# Patient Record
Sex: Female | Born: 1935 | Race: White | Hispanic: No | Smoking: Never smoker
Health system: Southern US, Community
[De-identification: ages and names within clinical notes are randomized; demographics above are authoritative.]

## PROBLEM LIST (undated history)

## (undated) DIAGNOSIS — I10 Essential (primary) hypertension: Secondary | ICD-10-CM

## (undated) DIAGNOSIS — S31109A Unspecified open wound of abdominal wall, unspecified quadrant without penetration into peritoneal cavity, initial encounter: Secondary | ICD-10-CM

## (undated) DIAGNOSIS — E538 Deficiency of other specified B group vitamins: Secondary | ICD-10-CM

## (undated) DIAGNOSIS — Z8619 Personal history of other infectious and parasitic diseases: Secondary | ICD-10-CM

## (undated) DIAGNOSIS — K56609 Unspecified intestinal obstruction, unspecified as to partial versus complete obstruction: Secondary | ICD-10-CM

## (undated) DIAGNOSIS — M1990 Unspecified osteoarthritis, unspecified site: Secondary | ICD-10-CM

## (undated) DIAGNOSIS — M8580 Other specified disorders of bone density and structure, unspecified site: Secondary | ICD-10-CM

## (undated) DIAGNOSIS — C801 Malignant (primary) neoplasm, unspecified: Secondary | ICD-10-CM

## (undated) DIAGNOSIS — D509 Iron deficiency anemia, unspecified: Secondary | ICD-10-CM

## (undated) DIAGNOSIS — Z86718 Personal history of other venous thrombosis and embolism: Secondary | ICD-10-CM

## (undated) DIAGNOSIS — R0789 Other chest pain: Secondary | ICD-10-CM

## (undated) DIAGNOSIS — I6523 Occlusion and stenosis of bilateral carotid arteries: Secondary | ICD-10-CM

## (undated) DIAGNOSIS — I351 Nonrheumatic aortic (valve) insufficiency: Secondary | ICD-10-CM

## (undated) DIAGNOSIS — IMO0001 Reserved for inherently not codable concepts without codable children: Secondary | ICD-10-CM

## (undated) HISTORY — DX: Other chest pain: R07.89

## (undated) HISTORY — DX: Malignant (primary) neoplasm, unspecified: C80.1

## (undated) HISTORY — PX: ILEOSTOMY: SHX1783

## (undated) HISTORY — DX: Occlusion and stenosis of bilateral carotid arteries: I65.23

## (undated) HISTORY — PX: COLOSTOMY: SHX63

## (undated) HISTORY — DX: Personal history of other infectious and parasitic diseases: Z86.19

## (undated) HISTORY — DX: Nonrheumatic aortic (valve) insufficiency: I35.1

## (undated) HISTORY — DX: Iron deficiency anemia, unspecified: D50.9

## (undated) HISTORY — DX: Unspecified intestinal obstruction, unspecified as to partial versus complete obstruction: K56.609

## (undated) HISTORY — PX: COLON SURGERY: SHX602

## (undated) HISTORY — DX: Essential (primary) hypertension: I10

## (undated) HISTORY — PX: COLECTOMY: SHX59

## (undated) HISTORY — DX: Other specified disorders of bone density and structure, unspecified site: M85.80

## (undated) HISTORY — PX: OTHER SURGICAL HISTORY: SHX169

## (undated) HISTORY — DX: Personal history of other venous thrombosis and embolism: Z86.718

## (undated) HISTORY — DX: Reserved for inherently not codable concepts without codable children: IMO0001

## (undated) HISTORY — DX: Deficiency of other specified B group vitamins: E53.8

---

## 1982-03-24 HISTORY — PX: ABDOMINAL HYSTERECTOMY: SHX81

## 1982-03-24 HISTORY — PX: APPENDECTOMY: SHX54

## 1998-09-12 ENCOUNTER — Other Ambulatory Visit: Admission: RE | Admit: 1998-09-12 | Discharge: 1998-09-12 | Payer: Self-pay | Admitting: Obstetrics and Gynecology

## 1999-11-11 ENCOUNTER — Other Ambulatory Visit: Admission: RE | Admit: 1999-11-11 | Discharge: 1999-11-11 | Payer: Self-pay | Admitting: Obstetrics and Gynecology

## 2000-02-17 ENCOUNTER — Encounter (INDEPENDENT_AMBULATORY_CARE_PROVIDER_SITE_OTHER): Payer: Self-pay | Admitting: *Deleted

## 2000-02-17 ENCOUNTER — Ambulatory Visit (HOSPITAL_COMMUNITY): Admission: RE | Admit: 2000-02-17 | Discharge: 2000-02-17 | Payer: Self-pay | Admitting: *Deleted

## 2000-02-18 ENCOUNTER — Encounter: Payer: Self-pay | Admitting: General Surgery

## 2000-02-18 ENCOUNTER — Inpatient Hospital Stay (HOSPITAL_COMMUNITY): Admission: RE | Admit: 2000-02-18 | Discharge: 2000-02-25 | Payer: Self-pay | Admitting: General Surgery

## 2000-02-18 ENCOUNTER — Encounter (INDEPENDENT_AMBULATORY_CARE_PROVIDER_SITE_OTHER): Payer: Self-pay | Admitting: *Deleted

## 2000-03-24 DIAGNOSIS — Z86718 Personal history of other venous thrombosis and embolism: Secondary | ICD-10-CM

## 2000-03-24 HISTORY — DX: Personal history of other venous thrombosis and embolism: Z86.718

## 2000-04-17 ENCOUNTER — Encounter: Payer: Self-pay | Admitting: Oncology

## 2000-04-17 ENCOUNTER — Inpatient Hospital Stay (HOSPITAL_COMMUNITY): Admission: EM | Admit: 2000-04-17 | Discharge: 2000-04-23 | Payer: Self-pay | Admitting: Oncology

## 2000-04-17 ENCOUNTER — Encounter: Admission: RE | Admit: 2000-04-17 | Discharge: 2000-04-17 | Payer: Self-pay | Admitting: Oncology

## 2000-06-01 ENCOUNTER — Encounter: Payer: Self-pay | Admitting: Oncology

## 2000-06-01 ENCOUNTER — Ambulatory Visit (HOSPITAL_COMMUNITY): Admission: RE | Admit: 2000-06-01 | Discharge: 2000-06-01 | Payer: Self-pay | Admitting: Oncology

## 2000-06-02 ENCOUNTER — Inpatient Hospital Stay (HOSPITAL_COMMUNITY): Admission: EM | Admit: 2000-06-02 | Discharge: 2000-06-13 | Payer: Self-pay | Admitting: Oncology

## 2000-06-03 ENCOUNTER — Encounter: Payer: Self-pay | Admitting: Oncology

## 2000-06-08 ENCOUNTER — Encounter: Payer: Self-pay | Admitting: Oncology

## 2000-06-09 ENCOUNTER — Encounter: Payer: Self-pay | Admitting: Oncology

## 2000-11-02 ENCOUNTER — Other Ambulatory Visit: Admission: RE | Admit: 2000-11-02 | Discharge: 2000-11-02 | Payer: Self-pay | Admitting: Obstetrics and Gynecology

## 2001-03-04 ENCOUNTER — Ambulatory Visit (HOSPITAL_COMMUNITY): Admission: RE | Admit: 2001-03-04 | Discharge: 2001-03-04 | Payer: Self-pay | Admitting: *Deleted

## 2001-03-04 ENCOUNTER — Encounter (INDEPENDENT_AMBULATORY_CARE_PROVIDER_SITE_OTHER): Payer: Self-pay | Admitting: *Deleted

## 2001-05-20 ENCOUNTER — Emergency Department (HOSPITAL_COMMUNITY): Admission: EM | Admit: 2001-05-20 | Discharge: 2001-05-20 | Payer: Self-pay | Admitting: *Deleted

## 2002-02-21 ENCOUNTER — Ambulatory Visit (HOSPITAL_COMMUNITY): Admission: RE | Admit: 2002-02-21 | Discharge: 2002-02-21 | Payer: Self-pay | Admitting: *Deleted

## 2006-11-09 ENCOUNTER — Encounter: Admission: RE | Admit: 2006-11-09 | Discharge: 2006-11-09 | Payer: Self-pay | Admitting: Gastroenterology

## 2006-12-24 ENCOUNTER — Ambulatory Visit: Admission: RE | Admit: 2006-12-24 | Discharge: 2006-12-24 | Payer: Self-pay | Admitting: General Surgery

## 2006-12-24 ENCOUNTER — Encounter (INDEPENDENT_AMBULATORY_CARE_PROVIDER_SITE_OTHER): Payer: Self-pay | Admitting: General Surgery

## 2006-12-24 ENCOUNTER — Inpatient Hospital Stay (HOSPITAL_COMMUNITY): Admission: RE | Admit: 2006-12-24 | Discharge: 2007-01-07 | Payer: Self-pay | Admitting: General Surgery

## 2007-02-22 ENCOUNTER — Ambulatory Visit (HOSPITAL_COMMUNITY): Admission: RE | Admit: 2007-02-22 | Discharge: 2007-02-22 | Payer: Self-pay | Admitting: General Surgery

## 2007-04-08 ENCOUNTER — Inpatient Hospital Stay (HOSPITAL_COMMUNITY): Admission: RE | Admit: 2007-04-08 | Discharge: 2007-04-11 | Payer: Self-pay | Admitting: General Surgery

## 2007-09-14 ENCOUNTER — Encounter: Admission: RE | Admit: 2007-09-14 | Discharge: 2007-09-14 | Payer: Self-pay | Admitting: Orthopedic Surgery

## 2007-11-03 ENCOUNTER — Encounter: Admission: RE | Admit: 2007-11-03 | Discharge: 2007-11-03 | Payer: Self-pay | Admitting: General Surgery

## 2008-03-24 HISTORY — PX: REPLACEMENT TOTAL KNEE: SUR1224

## 2008-03-24 HISTORY — PX: JOINT REPLACEMENT: SHX530

## 2008-11-17 HISTORY — PX: OTHER SURGICAL HISTORY: SHX169

## 2009-02-01 ENCOUNTER — Inpatient Hospital Stay (HOSPITAL_COMMUNITY): Admission: RE | Admit: 2009-02-01 | Discharge: 2009-02-05 | Payer: Self-pay | Admitting: Orthopedic Surgery

## 2009-02-05 ENCOUNTER — Ambulatory Visit: Payer: Self-pay | Admitting: Vascular Surgery

## 2009-02-05 ENCOUNTER — Encounter (INDEPENDENT_AMBULATORY_CARE_PROVIDER_SITE_OTHER): Payer: Self-pay | Admitting: Orthopedic Surgery

## 2009-11-29 ENCOUNTER — Ambulatory Visit: Payer: Self-pay | Admitting: Cardiology

## 2009-12-24 ENCOUNTER — Encounter: Admission: RE | Admit: 2009-12-24 | Discharge: 2009-12-24 | Payer: Self-pay | Admitting: General Surgery

## 2010-03-24 DIAGNOSIS — IMO0001 Reserved for inherently not codable concepts without codable children: Secondary | ICD-10-CM

## 2010-03-24 HISTORY — DX: Reserved for inherently not codable concepts without codable children: IMO0001

## 2010-04-16 ENCOUNTER — Encounter
Admission: RE | Admit: 2010-04-16 | Discharge: 2010-04-16 | Payer: Self-pay | Source: Home / Self Care | Attending: Cardiology | Admitting: Cardiology

## 2010-04-18 ENCOUNTER — Ambulatory Visit: Payer: Self-pay | Admitting: Cardiology

## 2010-06-26 LAB — BASIC METABOLIC PANEL
BUN: 12 mg/dL (ref 6–23)
BUN: 16 mg/dL (ref 6–23)
CO2: 27 mEq/L (ref 19–32)
CO2: 28 mEq/L (ref 19–32)
Calcium: 8.1 mg/dL — ABNORMAL LOW (ref 8.4–10.5)
Creatinine, Ser: 0.71 mg/dL (ref 0.4–1.2)
GFR calc Af Amer: >60 mL/min (ref >60)
Glucose, Bld: 123 mg/dL — ABNORMAL HIGH (ref 70–99)
Potassium: 3.7 mEq/L (ref 3.5–5.1)
Potassium: 4 mEq/L (ref 3.5–5.1)
Sodium: 139 mEq/L (ref 135–145)

## 2010-06-26 LAB — DIFFERENTIAL
Basophils Absolute: 0 10*3/uL (ref 0.0–0.1)
Basophils Relative: 1 % (ref 0–1)
Eosinophils Absolute: 0.1 10*3/uL (ref 0.0–0.7)
Eosinophils Relative: 2 % (ref 0–5)
Lymphs Abs: 1.3 10*3/uL (ref 0.7–4.0)
Monocytes Absolute: 0.4 10*3/uL (ref 0.1–1.0)
Monocytes Relative: 10 % (ref 3–12)
Neutro Abs: 2.7 10*3/uL (ref 1.7–7.7)

## 2010-06-26 LAB — COMPREHENSIVE METABOLIC PANEL
BUN: 14 mg/dL (ref 6–23)
Chloride: 104 mEq/L (ref 96–112)
Creatinine, Ser: 0.77 mg/dL (ref 0.4–1.2)
GFR calc Af Amer: >60 mL/min (ref >60)
Glucose, Bld: 104 mg/dL — ABNORMAL HIGH (ref 70–99)
Potassium: 4.2 mEq/L (ref 3.5–5.1)
Sodium: 140 mEq/L (ref 135–145)

## 2010-06-26 LAB — CROSSMATCH
ABO/RH(D): B POS
Antibody Screen: NEGATIVE

## 2010-06-26 LAB — CBC
HCT: 26.4 % — ABNORMAL LOW (ref 36.0–46.0)
HCT: 31.3 % — ABNORMAL LOW (ref 36.0–46.0)
HCT: 34.1 % — ABNORMAL LOW (ref 36.0–46.0)
MCHC: 34.2 g/dL (ref 30.0–36.0)
MCHC: 34.8 g/dL (ref 30.0–36.0)
MCV: 92.3 fL (ref 78.0–100.0)
MCV: 92.4 fL (ref 78.0–100.0)
Platelets: 175 10*3/uL (ref 150–400)
Platelets: 189 10*3/uL (ref 150–400)
Platelets: 224 10*3/uL (ref 150–400)
Platelets: 274 10*3/uL (ref 150–400)
RDW: 12.8 % (ref 11.5–15.5)
RDW: 13 % (ref 11.5–15.5)
WBC: 4.5 10*3/uL (ref 4.0–10.5)
WBC: 4.5 10*3/uL (ref 4.0–10.5)

## 2010-06-26 LAB — URINALYSIS, ROUTINE W REFLEX MICROSCOPIC
Bilirubin Urine: NEGATIVE
Glucose, UA: NEGATIVE mg/dL
Hgb urine dipstick: NEGATIVE
Ketones, ur: NEGATIVE mg/dL
Specific Gravity, Urine: 1.01 (ref 1.005–1.030)

## 2010-06-26 LAB — HEMOGLOBIN AND HEMATOCRIT, BLOOD: Hemoglobin: 8.5 g/dL — ABNORMAL LOW (ref 12.0–15.0)

## 2010-06-26 LAB — GLUCOSE, CAPILLARY: Glucose-Capillary: 117 mg/dL — ABNORMAL HIGH (ref 70–99)

## 2010-06-26 LAB — APTT: aPTT: 28 seconds (ref 24–37)

## 2010-06-26 LAB — PREPARE RBC (CROSSMATCH)

## 2010-07-09 ENCOUNTER — Other Ambulatory Visit: Payer: Self-pay | Admitting: *Deleted

## 2010-07-09 DIAGNOSIS — R002 Palpitations: Secondary | ICD-10-CM

## 2010-07-09 DIAGNOSIS — I10 Essential (primary) hypertension: Secondary | ICD-10-CM

## 2010-07-09 MED ORDER — HYDROCHLOROTHIAZIDE 12.5 MG PO CAPS: 12.5000 mg | ORAL_CAPSULE | Freq: Every day | ORAL | Status: DC

## 2010-07-09 MED ORDER — ATENOLOL 50 MG PO TABS: 50.0000 mg | ORAL_TABLET | Freq: Two times a day (BID) | ORAL | Status: DC

## 2010-07-09 MED ORDER — LOSARTAN POTASSIUM 100 MG PO TABS: 100.0000 mg | ORAL_TABLET | Freq: Every day | ORAL | Status: DC

## 2010-07-09 NOTE — Telephone Encounter (Signed)
Refilled meds per fax request.  

## 2010-07-10 ENCOUNTER — Other Ambulatory Visit: Payer: Self-pay | Admitting: *Deleted

## 2010-07-15 ENCOUNTER — Encounter: Payer: Self-pay | Admitting: Cardiology

## 2010-08-06 NOTE — Op Note (Signed)
Priscilla [name], Priscilla [name]              ACCOUNT NO.:  [id number]   MEDICAL RECORD NO.:  [id number]          PATIENT TYPE:  INP   LOCATION:  0005                         FACILITY:  [facility]   PHYSICIAN:  Adolph Pollack, M.D.DATE OF BIRTH:  October 16, 1930   DATE OF PROCEDURE:  04/08/2007  DATE OF DISCHARGE:                               OPERATIVE REPORT   PREOPERATIVE DIAGNOSES:  Loop ileostomy.   POSTOPERATIVE DIAGNOSIS:  Loop ileostomy.   PROCEDURE:  Loop ileostomy closure.   SURGEON:  Adolph Pollack, M.D.   ASSISTANT:  Anselm Pancoast. Zachery Dakins, M.D.   ANESTHESIA:  General.   INDICATIONS:  A 76-year female underwent low anterior resection with a  protective loop ileostomy.  She now presents for closure of loop  ileostomy.   TECHNIQUE:  She was brought to the operating room, placed supine on the  operating table and general anesthetic was administered.  A Foley  catheter was placed in the bladder.  The stomal appliance was removed.  The abdominal wall and stoma were sterilely prepped and draped.  A  elliptical incision was made around the stoma through skin and  subcutaneous tissue.  Using careful sharp dissection, as well select  electrocautery I dissected subcutaneous tissue away from both limbs of  the ileum down to the level of the fascia.  I then divided adhesions  between the fascia with the ileum allowing me to bring some of the ileum  up into the wound along with the stomas.  I cleaned subcutaneous tissue  off the two limbs of the loops of the ileum until I could bring them  together in a side-to-side fashion.   I then brought a GIA 75 stapler into the field.  I placed each limb of  the stapler down the afferent and efferent loops of the loop ileostomy  and fired the stapler creating a side-to-side anastomosis.  The common  defect was then closed using a linear non-cutting stapler.  A crotch  stitch of 3-0 silk was used.  Anastomosis was patent and viable under no  tension.   I dropped this back into the abdominal cavity.   At this time, gloves were changed.  There are some adhesions between the  small bowel and the posterior abdominal wall near the fascia which I  carefully mobilized sharply dropping the small bowel away from the  abdominal wall allowing for easier closure.  The needle, sponge,  instrument counts reportedly correct at this time.  Subsequently, then  noted that hemostasis was adequate.  I closed the fascia with  interrupted #1 PDS sutures in a figure-of-eight  type fashion.  I then copiously irrigated out the subcutaneous tissue.  I then closed the skin loosely with staples.  Applied a sterile  dressing.   She tolerated the procedure without apparent complications and was taken  to recovery in satisfactory condition.      Adolph Pollack, M.D.  Electronically Signed     TJR/MEDQ  D:  04/08/2007  T:  04/08/2007  Job:  [id number]   cc:   Graylin Shiver, M.D.  Fax: [phone]  Cassell Clement, M.D.  Fax: [phone]

## 2010-08-06 NOTE — Op Note (Signed)
Priscilla [name], Priscilla [name]              ACCOUNT NO.:  [id number]   MEDICAL RECORD NO.:  [id number]          PATIENT TYPE:  INP   LOCATION:  1528                         FACILITY:  [facility]   PHYSICIAN:  Adolph Pollack, M.D.DATE OF BIRTH:  1930/10/01   DATE OF PROCEDURE:  12/24/2006  DATE OF DISCHARGE:                               OPERATIVE REPORT   PREOPERATIVE DIAGNOSIS:  Cancer of the rectosigmoid colon.   POSTOPERATIVE DIAGNOSIS:  Rectal cancer.   PROCEDURE:  Low anterior resection of the rectum and loop ileostomy.   SURGEON:  Adolph Pollack, M.D.   ASSISTANT:  Leonie Man, M.D.   ANESTHESIA:  General.   INDICATIONS:  Priscilla [name] is a 77 year old female who has a history  right colon cancer and underwent a right total knee colectomy in the  past.  She is noted to have some anemia, had repeat colonoscopy, had a  sessile polyp at the rectosigmoid junction by report that was biopsy  positive for adenocarcinoma.  She now presents for the above procedure.   TECHNIQUE:  She is seen in the holding area and brought to the operating  room, placed on the operating table and general anesthetic was  administered.  A Foley catheter placed in her bladder, and she was  placed in the lithotomy position.  The abdominal wall and perineal area  were sterilely prepped and draped.  A midline incision was made from the  pubis to the umbilicus, traversing just to the left the umbilicus -  dividing the skin, subcutaneous tissue, fascia and peritoneum.  Once  entering the peritoneal cavity, there was some omental adhesions to the  right abdominal wall that were taken down with electrocautery.  The  pelvis was fairly free of most adhesions.  A self-retaining retractor  was placed.  She was placed in Trendelenburg position.   The sigmoid colon and distal descending colon were mobilized by dividing  the lateral attachments.  The left and right ureters were identified and  traced and unharmed.  I  further mobilized the rectum below the  peritoneal reflection by dividing some of its lateral attachments.  I  then chose an area in the distal descending colon/proximal sigmoid colon  region and divided the colon with a GIA stapler.  I divided the  mesentery using the LigaSure, and the larger mesenteric vessels were  divided and ligated with suture.  I then used a LigaSure to divide  posterior attachments below the area where we felt the lesion was.  I  performed a proctosigmoidoscopy prior to incision and felt that the  lesion was approximately 14 cm.  I got to the level where I thought that  was adequate.  The lateral rectal attachments were divided with a  LigaSure.  Using a linear cutting stapler, I then divided the rectum at  its midpoint.  I then took the specimen to the back table, opened it up  and did not see anything that looked like a sessile-type lesion.  I felt  that I may need to take a little bit more of the rectum and thus  mobilized about  another 2 to 3 cm of it and resected it with the linear  cutting stapler.  When I opened this up, I also did not see a sessile  lesion as was described by Dr. Evette Cristal in his colonoscopy note.  I  subsequently talked to Dr. Evette Cristal intraoperatively, and he described the  lesion as more of a flat, firm area.  Upon very careful inspection of  the first specimen near the distal margin, there was a flat, firm lesion  that matched its description.  I marked this with as a suture.  Given  the second resection, I did get an adequate distal margin.  These were  sent to pathology.   Following this, I mobilized the descending colon some more to allow it  to drop down in the pelvis.  I made an incision and obtained taenia and  inserted a size 29 EEA anvil.  I then used a 2-0 Prolene to perform a  pursestring suture around it.  I then performed a stapled anastomosis  mass using the circular stapler in a side-to-end fashion.  Upon  completion of the  anastomosis, two intact donuts were noted, the distal  most which was sent for the distal-most margin.   Following this, I placed the anastomosis in water and occluded it,  injected air through the rectum.  There appeared to be a leak, and I  noticed a small hole despite the intact donuts, in the lateral left  lateral position which I closed with some silk suture.  There was a  little tension on the anastomosis, and I further mobilized the  descending colon by dividing the lateral attachments, taking the tension  off.   Since there was a small leak and I repaired it, I felt it would be the  safest thing to do, to do a temporary loop ileostomy for diversion and  also insert a drain.  Stab wound was made in the left lower quadrant,  and a 19-Blake drain was inserted to the pelvis.  Irrigation was  performed, and hemostasis was adequate.  Irrigation fluid was evacuated.  I then noted a number of adhesions in the right lower quadrant region.  I lysed the adhesions between the omentum and the anterior abdominal  wall, omentum and small bowel in the right gutter area, and small bowel  to small bowel until I identified the ileocolonic anastomosis.  This  took approximately one hour.  I then traced this proximally and chose  some ileum.  A circular incision was made in the right lower quadrant  through the skin and subcutaneous tissue and a cruciate incision made  through the anterior and posterior fascia and dilated to 2  fingerbreadths.  A loop of ileum was then brought up through this  incision and a small defect made in the mesentery adjacent to the bowel.  A loop stroma bar was then placed through a small defect and anchored to  the skin with 4-0 nylon suture.   Following this, a needle, sponge, and instrument count was requested and  reported to be correct.  The drain was sewn to the skin with 3-0 Ethilon  suture in the left lower quadrant.  The fascia was closed with a running  #1 PDS  suture.  The subcutaneous tissues were irrigated, and skin was  closed with staples.   A loop ileostomy was matured by making a transverse incision in it and  then maturing it with 3-0 Vicryl sutures.  A stomal appliance was  applied.  A sterile dressing was applied in the midline wound.   She tolerated the procedure without any apparent complications and was  taken to recovery in satisfactory condition.      Adolph Pollack, M.D.  Electronically Signed     TJR/MEDQ  D:  12/25/2006  T:  12/25/2006  Job:  [id number]   cc:   Graylin Shiver, M.D.  Fax: [phone]

## 2010-08-06 NOTE — H&P (Signed)
Priscilla [name], Priscilla [name]              ACCOUNT NO.:  [id number]   MEDICAL RECORD NO.:  [id number]          PATIENT TYPE:  INP   LOCATION:  0005                         [facility]ACILITY:  [facility]   PHYSICIAN:  Adolph Pollack, M.D.DATE OF BIRTH:  [birth year]   DATE OF ADMISSION:  04/08/2007  DATE OF DISCHARGE:                              HISTORY & PHYSICAL   REASON FOR ADMISSION:  Loop ileostomy closure.   HISTORY OF PRESENT ILLNESS:  Priscilla [name] is a 77 year old female who had  rectal cancer.  She had a low anterior resection and productive loop  ileostomy.  She has recovered completely from that as well as postop  therapies and now presents for loop ileostomy closure.  We discussed the  procedure and risks preoperatively.   PAST MEDICAL HISTORY:  1. Rectal cancer.  2. Cecal cancer.  3. Mitral valve prolapse.  4. Hypertension.  5. Deep venous thrombosis of the lower extremity.   HER PREVIOUS OPERATIONS:  1. Right colectomy.  2. Hysterectomy.  3. Bladder suspension.  4. A low anterior resection with loop ileostomy.   ALLERGIES:  None known.   MEDICATIONS:  Atenolol, multivitamins, calcium.   SOCIAL HISTORY:  No tobacco or alcohol use.   FAMILY HISTORY:  Notable for heart disease.   PHYSICAL EXAM:  Generally a well-developed, well-nourished female.  She  is in no acute distress and very pleasant and cooperative.  Her vital  signs:  Temperature is 97.7, blood pressure is 141/57, pulse 71, O2  saturation 98%.   EYES:  Extraocular movements are intact.  No icterus.  NECK: Supple without obvious masses.  RESPIRATORY:  Breath sounds equal.  Respirations unlabored.  CARDIOVASCULAR:  Regular rate, regular rhythm.  No murmur.  ABDOMEN: Soft with midline scar well healed without hernia.  Right lower  quadrant stomas are noted.  EXTREMITIES:  SCDs hose on.   IMPRESSION:  Loop ileostomy - she has had a preop study demonstrating  patency of the low rectal anastomosis with no leak.   PLAN:  Loop ostomy closure.      Adolph Pollack, M.D.  Electronically Signed     TJR/MEDQ  D:  04/08/2007  T:  04/08/2007  Job:  [id number]

## 2010-08-09 NOTE — Op Note (Signed)
[facility]  Patient:    Priscilla [name], Priscilla [name]                     MRN: [id number] Proc. Date: 02/18/00 Adm. Date:  [id number] Attending:  Arlis Porta CC:         Celso Sickle, M.D.  Roosvelt Harps, M.D.  Thomas A. Patty Sermons, M.D.   Operative Report  PREOPERATIVE DIAGNOSIS:  Adenocarcinoma of the cecum.  POSTOPERATIVE DIAGNOSIS:  Adenocarcinoma of the cecum.  OPERATION PERFORMED:  Right colectomy with resection of distal ileum.  SURGEON:  Adolph Pollack, M.D.  ASSISTANT:  Rose Phi. Maple Hudson, M.D.  ANESTHESIA:  General.  INDICATIONS FOR PROCEDURE:  The patient is a 77 year old female who had occult blood in her stool.  She underwent a colonoscopy which demonstrated a firm cecal mass that was biopsied, positive for adenocarcinoma.  She now presents for elective resection.  FINDINGS:  There was a lesion which appeared to be growing into the wall of the cecum.  There was one nodule present on the distal and the serosal surface of the distal ileum.  This part of the distal ileum was resected with the right colon.  DESCRIPTION OF PROCEDURE:  She was placed supine on the operating table and a general anesthetic was administered.  Her abdomen was sterilely prepped and draped.  A transverse incision was made in the right midabdomen incising the skin sharply and then incising the subcutaneous tissues, fascia, muscle and peritoneum.  Once entering the abdominal cavity was explored.  The liver was smooth and without any lesions present.  The gallbladder was without gallstones.  Exploring the small intestine demonstrated a lesion, there was a small nodule measuring 2 to 3 mm.  It was 10 cm proximal to the ileocecal valve.  I could palpate the cecal tumor.  I began by mobilizint the cecum incising its lateral attachments.  Doing this, there was a serosal injury to the cautery to the duodenum and this was repaired with a single Vicryl  suture Lembert type suture with omental patch placed over it.  There was no leakage of bowel contents as this was the only serosal injury.  Next, I dissected the omentum free from the transverse colon.  I then divided the 2 or 3 cm proximal to a nodular lesion in the distal ileum.  I then divided the colon at the junction of the proximal one third, distal two thirds of the transverse colon.  The mesentery and the vessels were then divided between clamps and ligated.  The specimen was handed off the field and sent to pathology.  Next, a side-to-side staples anastomosis was performed.  The anastomosis was patent, viable and without tension.  The mesenteric defect was closed with interrupted Vicryl sutures.  All gloves were then changed and the abdominal cavity was irrigated.  No active bleeding was noted.  The posterior fascial layer was then closed with running #1 PDS suture.  The anterior fascial layer was closed with running #1 PDS suture.  The subcutaneous tissue was irrigated and the skin closed with staples.  The patient tolerated the procedure well without any apparent complications and was taken to recovery room in satisfactory condition. DD:  02/18/00 TD:  02/18/00 Job: [id number]

## 2010-08-09 NOTE — Discharge Summary (Signed)
[facility]  Patient:    Priscilla [name], Priscilla [name]                     MRN: [id number] Adm. Date:  [id number] Disc. Date: [id number] Attending:  Jama Flavors Pearcy CC:         Adolph Pollack, M.D.  John C. Madilyn Fireman, M.D.   Discharge Summary  HISTORY OF PRESENT ILLNESS:  The patient is a 77 year old, white female with history of colon cancer, status post right hemicolectomy in December 2001, with 13 nodes negative who was admitted with diarrhea, nausea and vomiting, refractory and unable to be managed at home.  Last chemotherapy with 5-FU and leucovorin prior to the admission on April 08, 2000.  For details of her presentation and course to this point, see the dictated H&P.  Review of systems, past medical history, family history and social history dictated in the H&P.  PHYSICAL EXAMINATION:  VITAL SIGNS:  Temperature 101.8, respiratory rate 20, heart rate 86 and regular, blood pressure 128/50.  HEENT:  Oropharynx dry.  CHEST:  Clear.  HEART:  Regular rate and rhythm.  ABDOMEN:  Positive bowel sounds, soft, slightly tender in left lower quadrant.  EXTREMITIES:  Without edema.  NEUROLOGIC:  Alert and oriented fully.  Cranial nerves II-XII intact.  Motor and sensation intact.  LABORATORY DATA AND X-RAY FINDINGS:  White count 3800, absolute neutrophils 2.8, hemoglobin 11.4, platelets 387.  Stool for occult blood x 2 positive initially and an additional two studies negative.  Sodium 135, potassium 3.2 (which was supplemented), chloride 102, CO2 27, glucose 137, BUN 10, creatinine 0.7, calcium 8.6.  Total protein 6.7, albumin 3.4, AST 23, ALT 17, Alk phos 84, total bilirubin 0.8, magnesium 1.8.  Iron 44% saturation, just barely low at 19.  Blood cultures x 2 from admission were no growth.  A urine culture from admission with multiple species, no obvious infection and a followup multiple species.  C. difficile toxin and stool cultures from admission  were negative and a repeat C. difficile toxin from January 28, also negative.  Stool for O&P, from admission, was negative.  Subsequent exams also negative.  HOSPITAL COURSE:  The patient was admitted and begun on IV fluids with blood counts and chemistries followed.  She was kept on clear liquids initially and given Kaopectate for the diarrhea until the cultures returned negative.  She was seen in consultation by Dr. Dorena Cookey who followed her through the hospital stay.  With fever, she was begun on oral Flagyl and ciprofloxacin which were subsequently discontinued when the cultures returned negative.  She was seen in followup by Dr. Avel Peace in hospital as she had had a scheduled visit to his office.  She gradually improved, although still having intermittent liquid diarrhea by January 29.  Multiple similar cases of viral gastroenteritis were known in the community and it was felt that the symptoms were most likely related to that.  By January 30, she felt progressively better, but still had had at least eight loose bowel movements in 24 hours. Diet was gradually advanced with bland foods as she was able to tolerate.  She had been on p.o. iron prior to admission which was held during the hospital stay with GI upset and plans to resume that.  Followup CBC on January 29, with white count of 5.2, hemoglobin 9.7 with the hydration and platelets 341.  She developed some phlebitis symptoms in the left forearm which were treated with warm soaks.  By January 31, she had been through the entire night without stools and was taking p.o.  She was afebrile and had been up walking without difficulty.  She was felt stable for discharge at that point.  DISCHARGE MEDICATIONS: 1. Imodium after loose stools as needed with up to eight in 24 hours. 2. Atenolol 25 mg twice daily. 3. Ortho-Est 0.625 mg daily. 4. Estrace cream 0.01% three times weekly. 5. Baby aspirin daily. 6. Calcium with Vitamin  D 1200 mg daily. 7. Ferrous gluconate to be resumed in the next few days so she is able to    tolerate Protonix 40 mg daily instead of Pepcid. 8. Keflex 500 mg q.6h., if there is any increased erythema or tenderness at    the site of the IV infiltration.  SPECIAL INSTRUCTIONS:  She is to call my office if she begins the Keflex.  She is to use warm soaks to the left forearm through the day today.  ACTIVITY:  As tolerated.  DIET:  Advanced as tolerated.  FOLLOWUP:  Follow up with Dr. Darrold Span on May 06, 2000, or sooner if needed.  Follow up with Dr. Abbey Chatters in three months.  DISCHARGE DIAGNOSES: 1. Nausea, vomiting, diarrhea and fever felt most likely to be viral    gastroenteritis complicated by recent right hemicolectomy for colon cancer    and 5-FU/leucovorin chemotherapy, significantly improved at discharge. 2. Phlebitis in left forearm at previous intravenous site.  She will do warm    soaks and has prescription for Keflex on hand if symptoms do not improve. 3. Iron deficiency anemia related to the previous colon cancer.  To resume    oral iron when gastrointestinal symptoms have resolved.  CONDITION ON DISCHARGE:  Improved. DD:  05/19/00 TD:  05/20/00 Job: [id number]

## 2010-08-09 NOTE — H&P (Signed)
[facility]  Patient:    Priscilla [name], ARVIN                     MRN: [id number] Adm. Date:  [id number] Disc. Date: [id number] Attending:  Arlis Porta                         History and Physical  A 77 year old female followed by Lennis P. Darrold Span, M.D. with presentation to clinic today after a nine day history of watery diarrhea, cramping with some blood in the stool x 2.  She denies fever and chills.  However, on admission her temperature is 101.8 and she is beginning to chill now here tonight with nausea, vomiting.  No bowel movement since arrival on the floor.  PAST MEDICAL HISTORY: 1. T3 N0 right colon cancer 2 cm with 13 nodes negative, status post right    colectomy and distal ileum resection February 18, 2000 by Adolph Pollack, M.D.  She has received adjuvant 5FU leucovorin for two cycles    last given January 9 and January 16. 2. Mitral valve prolapse. 3. Total abdominal hysterectomy and bilateral salpingo-oophorectomy 1984. 4. Cystocele. 5. Rectocele repair. 6. Knee surgery.  MEDICATIONS:  Atenolol, Pepcid, iron.  ALLERGIES:  No known drug allergies.  SOCIAL HISTORY:  No tobacco.  No alcohol.  REVIEW OF SYSTEMS:  Positive for diarrhea, nausea, vomiting, fever, chills, dehydration, and weakness.  No headache.  No chest pain.  No shortness of breath.  No focal neurologic deficits.  PHYSICAL EXAMINATION:  VITAL SIGNS:  Temperature 101.8, respirations 20, heart 86, blood pressure 128/50.  HEENT:  Oropharynx and oral mucosa are dry.  CHEST:  Clear.  HEART:  Regular rate and rhythm.  ABDOMEN:  Positive bowel sounds.  Soft.  Questionable tenderness in the left lower quadrant.  EXTREMITIES:  No edema.  NEUROLOGIC:  Alert and oriented x 3.  Cranial nerves 2-12 grossly intact. Motor and sensation are intact globally.  Sensation is intact globally.  IMPRESSION:  A 77 year old female status post two cycles of 5FU  leukovorin now with colitis likely C. difficile.  We are going to do stool cultures for O&P as well as stool C. difficile.  Send cultures for blood.  After the C. difficile is sent we would treat empirically with Flagyl and Cipro given her fever and chills.  We will consult gastroenterology for their help in consideration of a flexible sigmoidoscopy.  We will continue supportive care maintaining comfort and control of her nausea, vomiting, and diarrhea. DD:  04/17/00 TD:  04/17/00 Job: [id number]

## 2010-08-09 NOTE — Discharge Summary (Signed)
[facility]  Patient:    Priscilla [name], Priscilla [name]                     MRN: [id number] Adm. Date:  [id number] Disc. Date: [id number] Attending:  Jama Flavors Pearcy CC:         Adolph Pollack, M.D.  Roosvelt Harps, M.D.  Thomas A. Patty Sermons, M.D.   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  First cycle was complicated by significant nausea, vomiting, and diarrhea after the fourth week which required hospitalization, then felt secondary to viral-type gastroenteritis, as this was prevalent in the community at that point.  Recovered fully prior to receiving week 1, cycle 2 5FU and leukovorin, which was also given at somewhat reduced dose.  For the past 2 to 3 days she has had progressive watery diarrhea multiple times daily with nausea and vomiting to the point that she is not able to keep down any p.o.  She is admitted with dehydration and weakness.  For details of presentation, see the dictated H&P.  Review of systems, past medical history, family history, and social history, see admit H&P.  PHYSICAL EXAMINATION:  VITAL SIGNS:  Weight 143 pounds, blood pressure 120/69, temperature 97.3, heart rate 75, respirations 20.  HEENT:  Oral mucosa clear.  NECK:  No peripheral adenopathy.  HEART:  Without rubs or gallops, regular rhythm.  LUNGS:  Clear.  ABDOMEN:  No hepatosplenomegaly, bowel sounds active.  No inguinal adenopathy. A little tender right lower quadrant without mass.  HOSPITAL COURSE:  The patient was admitted and begun on IV fluids and subcutaneous sinvistatin.  Laboratories on admission revealed a white count of 5.7, ANC 3.8, hemoglobin 12.8, platelets 246 from office labs.  Sodium 137, potassium  for some reason not reported on the flow sheets, with the rest of the CMET.  Chloride 102, CO2 28, glucose 122, BUN 15, creatinine 0.8.  Total protein 6.6, albumin 3.6.  AST 14, AST 12, total bilirubin 1.4, LDH 152.  KUB showed normal bowel gas pattern, surgical sutures in right lower quadrant, no acute abnormality.  She was febrile to 101.6 the night of admission, and by early the next morning 102.5.  Blood cultures x 2 drawn with the temperature spike on 3/13.  Both grew gram positive cocci that took a number of days for identification.  The patient was begun on Tequin on 3/13, and did defervesce well with that.  Vancomycin was added when both of the peripheral blood cultures were showing gram positive cocci, however, the final results were eventually available, peripheral cultures appeared to be contaminated, one with diphtheroids, and the other with a Staph species coag negative.  The patient was afebrile by that point, and the vancomycin was discontinued on 3/17.  Tequin continued for a total of seven days.  Stool for C. diff was negative throughout various points through the hospitalization.  Hemoglobin decreased to 0.3 with rehydration, however, fortunately she never became neutropenic, and blood counts had all improved by the time of discharge, with a CBC on 3/23, white count 10.7, 50% segs, hemoglobin 10.4, and platelets 353. The rash that she had as an outpatient earlier in the week prior to admission all rapidly resolved in hospital, including the large area on  the left hand and wrist that had been in the region of a previous IV.  Topical hydrocortisone was continued to that area for several days further in hospital.  Had skin declamation, and she never had open ulcerations, mouth or posterior pharynx.  Continued to have multiple watery stools ongoing, very persistent nausea despite multiple medications, and intermittent vomiting.  On 3/18, she had only managed to keep down a few  sips of clear liquids.  She had significant crampy-type abdominal pain with the diarrhea that required p.r.n. IV Dilaudid.  The fluids were continued, and her electrolytes were followed closely and supplemented as required.  Because of her inability to take p.o. and had declining nutritional status, which certainly was not helping resolve the acute GI toxicity from the chemotherapy, she had a triple lumen catheter placed in left subclavian by Dr. Abbey Chatters on 06/08/00, and began TNA through that line monitored by pharmacy.  Followed by nutritionist, case manager, and seen also by the hospital chaplain.  On 3/19, she had developed some aching-type discomfort in the right calf.  Nothing was obviously there on physical exam.  Doppler study showed calf vein DVT from the proximal mid calf to the distal calf.  She was begun on heparin initially and converted to Coumadin.  The pain in the calf resolved promptly, and there was never any problem with bleeding or any evidence of progressive clotting.  Her PT became therapeutic with just two days of oral Coumadin at 10 mg on 3/20 and 3/21.  On 3/22, the PT was supratherapeutic with an INR of 3.3, and the heparin was discontinued.  She was given 2.5 mg of Coumadin on 3/22, with INR on 3/23 elevated at 5.3.  The patient was instructed to hold Coumadin on 3/23 and 3/24, and she was scheduled for repeat PT to be done as an outpatient on 3/25. Her diarrhea progressively improved about the last 48 hours in the hospital, and by that point the abdominal pain had entirely resolved, as had her nausea and the vomiting.  She gradually progressed with bland diet while the TNA was used initially.  We tapered her off the TNA on 3/22, and by the morning of 3/23, she was taking p.o. adequately with some loose stools, but manageable just with oral Imodium.  Felt stable for discharge at that point with close follow up in the office.  DISCHARGE MEDICATIONS:  1.  Imodium 2 tablets q.i.d. p.r.n. 2. She is to hold her iron supplement until the diarrhea has entirely    resolved. 3. Tenormin 25 mg as previously. 4. Coumadin held until PT repeated at the office on 3/25. 5. Estrogen as previously. 6. No aspirin while on Coumadin.  ACTIVITY:  No driving for at least the next week.  Otherwise, as tolerated.  DIET:  As instructed and tolerated.  WOUND CARE:  As instructed to the central line site with the central line catheter having been discontinued prior to her discharge.  She is to call with any questions or problems.  FOLLOWUP:  Coumadin check in Dr. Henreitta Leber office on 3/25, and visit with repeat labs on 3/28.  DIAGNOSES:  1. Acute gastrointestinal toxicity secondary to 5FU leukovorin chemotherapy,     being used adjuvantly in a multiple node negative colon cancer, patient     post right hemicolectomy.  2. Contamination of two peripheral blood cultures.  3. Right calf deep venous thrombosis in hospital which we plan to continue     Coumadin outpatient for the next  several months.  4. Peripheral access requiring central line in hospital.  5. Poor nutritional status secondary to inability to take adequate p.o.,     treated with TNA in hospital.  6. Anemia, in part iron-deficiency, and in part related to the recent     chemotherapy: to resume her oral iron when gastrointestinal symptoms     entirely resolve.  7. Post-hysterectomy.  8. Status post arthroscopic knee surgery remotely.  9. Remote bladder surgery. 10. History of mitral valve prolapse.  CONDITION ON DISCHARGE:  Improved.  The patients severe GI toxicity again with the 5FU and leukovorin, we have decided to discontinue this treatment which was beginning empiric adjuvant therapy.  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 DD:  06/18/00 TD:  06/18/00 Job: [id number]

## 2010-08-09 NOTE — Discharge Summary (Signed)
[name], [name]              ACCOUNT NO.:  [id number]   MEDICAL RECORD NO.:  [id number]          PATIENT TYPE:  INP   LOCATION:  1528                         FACILITY:  [facility]   PHYSICIAN:  Adolph Pollack, M.D.DATE OF BIRTH:  10-30-1930   DATE OF ADMISSION:  12/24/2006  DATE OF DISCHARGE:  01/07/2007                               DISCHARGE SUMMARY   FINAL DIAGNOSIS:  Rectal cancer.   SECONDARY DIAGNOSES:  1. Prolonged postoperative ileus.  2. Escherichia coli urinary tract infection.  3. Protein-calorie malnutrition.  4. Acute blood-loss anemia.  5. Hypertension.   REASON FOR ADMISSION:  This 77 year old female has a history of cecal  cancer and had a right colectomy November 2001.  She has had some anemia  recently and hemoccult-positive stool.  Colonoscopy demonstrated a  lesion in the upper rectum that was biopsied and positive for  adenocarcinoma.  She presented for the above procedure.   HOSPITAL COURSE:  She underwent a low anterior resection of the rectum  with a protective loop ileostomy December 24, 2006, which she tolerated  fairly well.  Her postoperative course was remarkable for a prolonged  postoperative ileus requiring an ng tube.  She also had some acute blood-  loss anemia.   She was started on TNA because of protein-calorie malnutrition.  X-rays  were monitored and were more consistent with an ileus.  This ileus  fairly slowly started to resolve allowing [place] to remove the nasogastric  tube.  She was slowly advanced to a clear liquid diet and the drain was  removed.  She had a question of hematuria and a urinalysis was sent and  this was consistent with a UTI and cultures positive for E. coli and she  had been started on Cipro.  By her 14th postoperative day she was  tolerating a diet.  The stoma looked good and was functioning well, and  she had started stoma education.  Her PICC line and staples were removed  and she was discharged.   DISPOSITION:   Discharged home January 07, 2007.  She will have home  health coming to help her with her loop ileostomy.  I wanted her to  continue her home medications as well as Cipro for the urinary tract  infection and Vicodin for pain.  Specific discharge destruction were  given and she is due to see me back in the office in about 2 weeks.      Adolph Pollack, M.D.  Electronically Signed     TJR/MEDQ  D:  01/20/2007  T:  01/20/2007  Job:  [id number]   cc:   Graylin Shiver, M.D.  Fax: [phone]   Cassell Clement, M.D.  Fax: [phone]

## 2010-08-09 NOTE — H&P (Signed)
[facility]  Patient:    Priscilla [name], Priscilla [name]                     MRN: [id number] Adm. Date:  [id number] Disc. Date: [id number] Attending:  Jama Flavors Pearcy CC:         Adolph Pollack, M.D.  Esmeralda Arthur, M.D.  Roosvelt Harps, M.D.  Lennis P. Darrold Span, M.D.  Thomas A. Patty Sermons, M.D.   History and Physical  HISTORY OF PRESENT ILLNESS:  The patient is a 77 year old female followed by Lennis P. Darrold Span, M.D. She initially had a T3, N0 cecal carcinoma, status post right hemicolectomy with resection of distal ileum February 18, 2000. She had 13/13 of nodes negative. She was offered protocol therapy, but it had  an observation arm, and she preferred to pursue conventional 5-FU/leucovorin therapy.  This was started in January of this year. She received 2 weeks of therapy but then had a lot of side effects, including apparently diarrhea. Her chemotherapy was then started back on May 13, 2000, and she got another a week on May 21, 2000, then a week on May 27, 2000, for a total of 5 weeks of therapy. She is now a week out from her last treatment.  She required admission when she had her prior break. This time, she had been doing well up until about the last 2 or 3 days. She then developed pretty severe diarrhea. She was offered admission yesterday but declined. She was given a Sandostatin shot yesterday; but despite that and Lomotil, she has had a dozen loose bowel movements today. She is feeling weaker. She feels nauseated. She is not able to eat; and in fact, is getting quite dehydrated. Subsequently, she is agreeable to admission today.  PAST MEDICAL HISTORY:  Significant for mitral valve prolapse.  PAST SURGICAL HISTORY:  She had a hysterectomy in 1984, arthroscopic knee surgery, and bladder surgery.  ALLERGIES:  No allergies reported.  FAMILY HISTORY:  Positive for colon cancer in her grandmother. No family history of  ulcers or inflammatory bowel disease.  SOCIAL HISTORY:  She is widowed, retired. Her children live across from her. She does not smoke or drink.  HOME MEDICATIONS: 1. Atenolol 25 mg p.o. q.d. 2. Estrogen 0.625 mg p.o. q.d. 3. Estrace cream, baby aspirin, and Protonix each day. 4. Takes lorazepam p.r.n.  REVIEW OF SYSTEMS:  She has not really had any chills or fever. Most of her symptoms again are nausea and vomiting. She has had no problems passing her urine. No blood in her urine or stool. No respiratory symptoms complained of.  PHYSICAL EXAMINATION:  VITAL SIGNS:  Weight is 143 pounds, blood pressure 121/69, temperature 97.3, pulse 75, respirations 20.  HEENT:  Oral mucosa within normal limits.  NECK:  No cervical, supraclavicular, or axillary adenopathy.  HEART:  S1 grade and S2 without rubs or gallops.  LUNGS:  Fairly clear.  ABDOMEN:  Benign without hepatosplenomegaly. Bowel sounds active. No inguinal adenopathy. She is a little tender in her right lower quadrant but no mass effect felt.  LABORATORY DATA:  White blood cell count today is 5.75, absolute neutrophil count 38,000, hemoglobin 12.8, platelet count 246,000.  ASSESSMENT:  Colon carcinoma with diarrhea and dehydration secondary to her 5-FU therapy.  PLAN:  What we will do is put her in the hospital and give her plenty of IV fluids and Sandostatin twice a day. We will eventually I told her catch up with her fluids. We  will start her out on a liquid diet and increase as tolerated.  She told me today that she really does not want to take any further adjuvant chemotherapy since it has been so morbid, and I have asked her to discuss that further with Lennis P. Darrold Span, M.D. when she feels a little better. DD:  06/02/00 TD:  06/02/00 Job: [id number]

## 2010-08-09 NOTE — Consult Note (Signed)
[place]  Patient:    Priscilla [name], Priscilla [name]                     MRN: [id number] Proc. Date: 02/17/00 Adm. Date:  [id number] Attending:  Mingo Amber CC:         Esmeralda Arthur, M.D.  Roosvelt Harps, M.D.  Thomas A. Patty Sermons, M.D.   Consultation Report  REASON FOR ADMISSION:  Cecal mass.  HISTORY OF PRESENT ILLNESS:  This is a 77 year old female in her normal state of health who was noted to have guaiac-positive stool. This was done on a fecal occult blood test per home screening. She had colonoscopy arranged with Roosvelt Harps, M.D. today. This demonstrated a firm cecal mass that is quite large. It was biopsied, and biopsies were pending. I was asked to see her in the endoscopy unit by Roosvelt Harps, M.D. She has had a mechanical bowel preparation. She denies any weight loss or decreased energy. She states she does have some vague left-sided abdominal pain, but this is chronic. She does have some problems with reflux.  PAST MEDICAL HISTORY: 1. Mitral valve prolapse. 2. Gastroesophageal reflux.  PAST SURGICAL HISTORY: 1. Hysterectomy and bilateral salpingo-oophorectomy. 2. Arthroscopic knee surgery. 3. Bladder suspension surgery.  ALLERGIES:  None reported.  CURRENT MEDICATIONS: 1. Atenolol 25 mg b.i.d. 2. Estrogen 0.65 mg q.d. 3. Baby aspirin one a day. 4. Calcium 1200 mg q.d.  SOCIAL HISTORY:  She is widowed. She does not smoke or drink alcohol.  FAMILY HISTORY:  Positive for colon cancer in her grandmother.  REVIEW OF SYSTEMS:  CARDIOVASCULAR:  She has no chest pains or palpitations. No hypertension. PULMONARY:  No chronic lung disease present. GI:  No known peptic ulcer disease. She has the reflux that she takes Tums for. No hepatitis or diverticulitis. GU:  No hematuria present.  PHYSICAL EXAMINATION:  GENERAL:  Well-developed, well-nourished female in no acute distress, very pleasant and  cooperative.  HEENT:  Eyes:  Extraocular motions intact. Sclerae are clear.  NECK:  Supple without palpable masses.  CARDIOVASCULAR:  Heart demonstrates a regular rate and rhythm without a soft systolic murmur.  RESPIRATORY:  Breath sounds equal and clear. Respirations nonlabored.  ABDOMEN:  Soft, nontender. No palpable masses or organomegaly noted. Active bowel sounds are present.  EXTREMITIES:  No cyanosis or edema present.  IMPRESSION:  Large, firm cecal mass very suspicious for carcinoma. ______ removed by way of colonoscopy.  PLAN:  Partial colectomy. I did explain the procedure and the risks to her, including, but not limited to, bleeding, infection, anastomotic dehiscence, and the risk of the anesthesia to name a few. She seems to understand this and agrees to proceed.  Please note she will be an inpatient as of February 18, 2000.DD:  02/17/00 TD:  02/17/00 Job: [id number]

## 2010-08-09 NOTE — Discharge Summary (Signed)
[place]. [facility]  Patient:    Priscilla [name], Priscilla [name]                     MRN: [id number] Adm. Date:  [id number] Disc. Date: [id number] Attending:  Arlis Porta CC:         Roosvelt Harps, M.D.  Esmeralda Arthur, M.D.  Thomas A. Patty Sermons, M.D.   Discharge Summary  PRINCIPAL DISCHARGE DIAGNOSIS:  Stage II right colon cancer.  SECONDARY DIAGNOSES: 1. Mitral valve prolapse. 2. Gastroesophageal reflux disease.  PROCEDURE:  Right hemicolectomy February 18, 2000.  REASON FOR ADMISSION:  This is a 77 year old female with guaiac-positive stool.  Colonoscopy was performed by Dr. Roosvelt Harps.  We found a large firm cecal mass.  It was biopsied and positive for adenocarcinoma and she was admitted for elective resection.  HOSPITAL COURSE:  She was given perioperative antibiotic coverage for her mitral valve prolapse and postoperatively started back on her atenolol.  She did have some acute blood loss anemia and was given Procrit.  Pathology came back with a T3, N0, M0 lesion of moderately differentiated adenocarcinoma. She did have one episode of hypokalemia that was corrected.  She has been on liquid diet and advanced along.  She had return of her bowel function and by her seventh postoperative day she was moving her bowels, wound was clean, and she was discharged.  DISPOSITION:  Discharged to home February 25, 2000.  She will continue to take all of her preoperative medications and was given Tylox for pain.  She was given instruction sheet and told about activity restrictions and I will see her back in the office in two weeks.  CONDITION ON DISCHARGE:  She was discharged in satisfactory condition. DD:  03/05/00 TD:  03/05/00 Job: [id number]

## 2010-08-09 NOTE — Discharge Summary (Signed)
NAMEKAIDA, GAMES              ACCOUNT NO.:  [id number]   MEDICAL RECORD NO.:  [id number]          PATIENT TYPE:  INP   LOCATION:  1538                         [facility]ACILITY[facility]  [facility]   PHYSICIAN:  Adolph Pollack, M.D.DATE OF BIRTH:  1930-06-05   DATE OF ADMISSION:  04/08/2007  DATE OF DISCHARGE:  04/11/2007                               DISCHARGE SUMMARY   FINAL DISCHARGE DIAGNOSIS:  Loop ileostomy.   SECONDARY DIAGNOSES:  1. Rectal cancer.  2. Cecal cancer.  3. Mitral valve prolapse.  4. Hypertension.  5. Deep venous thrombosis lower extremity.   PROCEDURE:  Loop ileostomy closure.   REASON FOR ADMISSION:  Ms. Cannady is a 77 year old female who underwent  a low anterior resection and a protective loop ileostomy.  This was done  in the past.  She now presents for loop ileostomy closure.   HOSPITAL COURSE:  She underwent the loop ileostomy closure which she  tolerated well.  She started on a full liquid diet and advanced.  She  began having lots of gas and passing some liquid stools, but eventually  the stool volume decreased, and by April 11, 2007, she is tolerating a  diet, wound looked good, and she is able to be discharged.   DISPOSITION:  Discharged to home April 11, 2007, in satisfactory  condition.  She was given discharge instructions and will follow up with  me in the office.      Adolph Pollack, M.D.  Electronically Signed     TJR/MEDQ  D:  04/20/2007  T:  04/20/2007  Job:  [id number]   cc:   Graylin Shiver, M.D.  Fax: [phone]   Cassell Clement, M.D.  Fax: [phone]

## 2010-08-21 ENCOUNTER — Encounter: Payer: Self-pay | Admitting: Cardiology

## 2010-08-22 ENCOUNTER — Encounter: Payer: Self-pay | Admitting: Cardiology

## 2010-08-22 ENCOUNTER — Ambulatory Visit (INDEPENDENT_AMBULATORY_CARE_PROVIDER_SITE_OTHER): Payer: Medicare Other | Admitting: Cardiology

## 2010-08-22 DIAGNOSIS — K254 Chronic or unspecified gastric ulcer with hemorrhage: Secondary | ICD-10-CM | POA: Insufficient documentation

## 2010-08-22 DIAGNOSIS — M1990 Unspecified osteoarthritis, unspecified site: Secondary | ICD-10-CM | POA: Insufficient documentation

## 2010-08-22 DIAGNOSIS — I119 Hypertensive heart disease without heart failure: Secondary | ICD-10-CM | POA: Insufficient documentation

## 2010-08-22 DIAGNOSIS — I10 Essential (primary) hypertension: Secondary | ICD-10-CM | POA: Insufficient documentation

## 2010-08-22 DIAGNOSIS — Z85038 Personal history of other malignant neoplasm of large intestine: Secondary | ICD-10-CM

## 2010-08-22 DIAGNOSIS — Z8679 Personal history of other diseases of the circulatory system: Secondary | ICD-10-CM

## 2010-08-22 NOTE — Assessment & Plan Note (Signed)
The patient is presently on a regimen of Celebrex for the past 2 months and expects to take it for one additional month for treatment of osteoarthritic pain involving her knees and hips.  Dr. Priscille Kluver is her orthopedist.  The patient has occasional heartburn for which she takes [address]

## 2010-08-22 NOTE — Progress Notes (Signed)
Priscilla [name] Date of Birth:  10/12/1930 [facility] Cardiology / [facility] [address]   [address] [place], [place]  [address] [phone]           Fax   [phone]  History of Present Illness: This pleasant 77 year old is seen for a scheduled followup office visit.  She has been feeling well since last visit.  His not been expressing any chest pain or palpitations.  She has a history of high blood pressure.  She had a nuclear stress test on 11/17/08 which was normal.  She has a prior history of a valvular heart disease and had an echocardiogram on 06/15/08 which showed moderate aortic insufficiency and mild mitral regurgitation.  She has normal LV systolic function.  She has a past history of colon cancer with no evidence of recurrence.  She has had previous right total knee replacement by Dr. Brynda Greathouse and is presently on Celebrex for some residual discomfort in the right leg  Current Outpatient Prescriptions  Medication Sig Dispense Refill  . aspirin 81 MG tablet Take 81 mg by mouth daily.        Marland Kitchen atenolol (TENORMIN) 50 MG tablet Take 1 tablet (50 mg total) by mouth 2 (two) times daily.  180 tablet  3  . Celecoxib (CELEBREX PO) Take by mouth daily.        . hydrochlorothiazide (,MICROZIDE/HYDRODIURIL,) 12.5 MG capsule Take 1 capsule (12.5 mg total) by mouth daily.  90 capsule  3  . losartan (COZAAR) 100 MG tablet Take 1 tablet (100 mg total) by mouth daily.  90 tablet  3  . nitroGLYCERIN (NITROSTAT) 0.4 MG SL tablet Place 0.4 mg under the tongue every 5 (five) minutes as needed.        Marland Kitchen DISCONTD: Calcium Carbonate (CALCIUM 500 PO) Take by mouth daily.        Marland Kitchen DISCONTD: Multiple Vitamin (MULTIVITAMIN) tablet Take 1 tablet by mouth daily.          Allergies  Allergen Reactions  . Lisinopril Cough    Patient Active Problem List  Diagnoses  . History of valvular heart disease  . Benign hypertensive heart disease without heart failure  . Osteoarthritis  . History of colon  cancer    History  Smoking status  . Never Smoker   Smokeless tobacco  . Not on file    History  Alcohol Use No    Family History  Problem Relation Age of Onset  . Colon cancer Maternal Grandmother     Review of Systems: Constitutional: no fever chills diaphoresis or fatigue or change in weight.  Head and neck: no hearing loss, no epistaxis, no photophobia or visual disturbance. Respiratory: No cough, shortness of breath or wheezing. Cardiovascular: No chest pain peripheral edema, palpitations. Gastrointestinal: No abdominal distention, no abdominal pain, no change in bowel habits hematochezia or melena. Genitourinary: No dysuria, no frequency, no urgency, no nocturia. Musculoskeletal:No arthralgias, no back pain, no gait disturbance or myalgias. Neurological: No dizziness, no headaches, no numbness, no seizures, no syncope, no weakness, no tremors. Hematologic: No lymphadenopathy, no easy bruising. Psychiatric: No confusion, no hallucinations, no sleep disturbance.    Physical Exam: Filed Vitals:   08/22/10 0932  BP: 150/70  Pulse: 60  The general appearance reveals a well-developed well-nourished woman in no distress.Pupils equal and reactive.   Extraocular Movements are full.  There is no scleral icterus.  The mouth and pharynx are normal.  The neck is supple.  The carotids reveal  no bruits.  The jugular venous pressure is normal.  The thyroid is not enlarged.  There is no lymphadenopathy.The chest is clear to percussion and auscultation. There are no rales or rhonchi. Expansion of the chest is symmetrical.The precordium is quiet.  The first heart sound is normal.  The second heart sound is physiologically split.  There is no murmur gallop rub or click.  There is no abnormal lift or heave. There is a soft apical systolic murmur.The abdomen is soft and nontender. Bowel sounds are normal. The liver and spleen are not enlarged. There Are no abdominal masses. There are no  bruits.Normal extremity without phlebitis or edema.Strength is normal and symmetrical in all extremities.  There is no lateralizing weakness.  There are no sensory deficits.The skin is warm and dry.  There is no rash.   Assessment / Plan:  Continue same medication but try to be more careful with dietary salt particularly while she is on the Celebrex.  Recheck in 4 months for followup office visit and lab work.

## 2010-08-22 NOTE — Assessment & Plan Note (Signed)
The patient has a past history of valvular heart disease.  Her echocardiogram on 06/15/08 showed moderate aortic insufficiency and mild mitral regurgitation.  She has normal LV systolic function with an ejection fraction of 55-60% and she had diastolic dysfunction.  She has not been experiencing any chest pain or angina.  She had previous normal nuclear stress test on 11/17/08 which showed no evidence of ischemia.  She does have a history of a intermittent right bundle-branch block.  He has not been experiencing any chest discomfort or shortness of breath.  Energy level has been good.

## 2010-12-04 ENCOUNTER — Other Ambulatory Visit (INDEPENDENT_AMBULATORY_CARE_PROVIDER_SITE_OTHER): Payer: Self-pay | Admitting: General Surgery

## 2010-12-04 ENCOUNTER — Encounter (INDEPENDENT_AMBULATORY_CARE_PROVIDER_SITE_OTHER): Payer: Self-pay | Admitting: General Surgery

## 2010-12-04 ENCOUNTER — Ambulatory Visit (INDEPENDENT_AMBULATORY_CARE_PROVIDER_SITE_OTHER): Payer: Medicare Other | Admitting: General Surgery

## 2010-12-04 VITALS — BP 128/70 | HR 58

## 2010-12-04 DIAGNOSIS — Z85048 Personal history of other malignant neoplasm of rectum, rectosigmoid junction, and anus: Secondary | ICD-10-CM | POA: Insufficient documentation

## 2010-12-04 DIAGNOSIS — C20 Malignant neoplasm of rectum: Secondary | ICD-10-CM

## 2010-12-04 NOTE — Progress Notes (Signed)
Operation:  LAR  Date:  12/2006  Stage:  T1N0  HPI:  Priscilla [name] is here for long-term followup of her rectal cancer. She denies abdominal pain. She had one day where she had 3 episodes of bright red blood per rectum.  She has not had anything like this since that time. No difficulty with bowel movements. Her appetite is good.   PE:  Gen.-she looks well and in no acute distress.  Abdomen-soft, well-healed midline and right lower quadrant scars, no nodules, no hernias.  Nodes-no palpable cervical or inguinal adenopathy.  Rectal-normal tone, no fissures, no masses, staple line is palpable, brown stool present that is Hemoccult negative.  Assessment:  Stage I rectal cancer-no clinical evidence of recurrences; one episode of bright red blood per rectum that has not recurred.  Plan:  Call if she has recurring episodes of rectal bleeding. Check CEA level. Return visit 6 months.

## 2010-12-04 NOTE — Patient Instructions (Signed)
Call if you have recurring rectal bleeding.

## 2010-12-05 LAB — CEA: CEA: 1 ng/mL (ref 0.0–5.0)

## 2010-12-11 LAB — COMPREHENSIVE METABOLIC PANEL
ALT: 32
AST: 28
Albumin: 4
Alkaline Phosphatase: 108
GFR calc Af Amer: >60
Potassium: 4.7
Sodium: 139
Total Protein: 7.4

## 2010-12-11 LAB — DIFFERENTIAL
Basophils Relative: 1
Eosinophils Absolute: 0.1
Monocytes Absolute: 0.5
Monocytes Relative: 11
Neutro Abs: 2.6

## 2010-12-11 LAB — TYPE AND SCREEN: ABO/RH(D): B POS

## 2010-12-11 LAB — CBC
Platelets: 329
RDW: 13.6

## 2010-12-12 ENCOUNTER — Encounter: Payer: Self-pay | Admitting: Cardiology

## 2010-12-12 ENCOUNTER — Ambulatory Visit (INDEPENDENT_AMBULATORY_CARE_PROVIDER_SITE_OTHER): Payer: Medicare Other | Admitting: Cardiology

## 2010-12-12 ENCOUNTER — Ambulatory Visit (INDEPENDENT_AMBULATORY_CARE_PROVIDER_SITE_OTHER): Payer: Medicare Other | Admitting: *Deleted

## 2010-12-12 VITALS — BP 118/70 | HR 60 | Ht 60.0 in | Wt 154.0 lb

## 2010-12-12 DIAGNOSIS — Z8679 Personal history of other diseases of the circulatory system: Secondary | ICD-10-CM

## 2010-12-12 DIAGNOSIS — M1990 Unspecified osteoarthritis, unspecified site: Secondary | ICD-10-CM

## 2010-12-12 DIAGNOSIS — I119 Hypertensive heart disease without heart failure: Secondary | ICD-10-CM

## 2010-12-12 DIAGNOSIS — E785 Hyperlipidemia, unspecified: Secondary | ICD-10-CM

## 2010-12-12 DIAGNOSIS — Z85038 Personal history of other malignant neoplasm of large intestine: Secondary | ICD-10-CM

## 2010-12-12 LAB — CBC WITH DIFFERENTIAL/PLATELET
Basophils Absolute: 0 10*3/uL (ref 0.0–0.1)
Basophils Relative: 1.2 % (ref 0.0–3.0)
Eosinophils Absolute: 0.1 10*3/uL (ref 0.0–0.7)
Lymphocytes Relative: 26.9 % (ref 12.0–46.0)
MCHC: 33.8 g/dL (ref 30.0–36.0)
Neutrophils Relative %: 56.4 % (ref 43.0–77.0)
Platelets: 285 10*3/uL (ref 150.0–400.0)
RBC: 3.82 Mil/uL — ABNORMAL LOW (ref 3.87–5.11)
RDW: 12.8 % (ref 11.5–14.6)

## 2010-12-12 LAB — BASIC METABOLIC PANEL
GFR: 69.7 mL/min (ref >60.00)
Glucose, Bld: 93 mg/dL (ref 70–99)
Potassium: 5.1 mEq/L (ref 3.5–5.1)
Sodium: 139 mEq/L (ref 135–145)

## 2010-12-12 LAB — HEPATIC FUNCTION PANEL
Alkaline Phosphatase: 76 U/L (ref 39–117)
Bilirubin, Direct: 0.1 mg/dL (ref 0.0–0.3)
Total Bilirubin: 0.7 mg/dL (ref 0.3–1.2)

## 2010-12-12 LAB — LIPID PANEL
HDL: 80.3 mg/dL (ref >39.00)
VLDL: 24.4 mg/dL (ref 0.0–40.0)

## 2010-12-12 NOTE — Progress Notes (Signed)
[name] Date of Birth:  11-11-1930 [facility] Card[facility] / [facility] [address]   [address] [place], [place]  [address] [phone]           Fax   [phone]  History of Present Illness: This pleasant 77 year old woman is seen for a scheduled followup visit.  She has a history of high blood pressure.  She does not have any history of ischemic heart disease.  She had a nuclear stress test on 11/17/08 which was normal.  She has a prior history of valvular heart disease and had an echocardiogram on 06/15/08 showed moderate aortic insufficiency and mild mitral regurgitation and normal left ventricular function.  The patient has a history of a history of arthritis and has had a previous right total knee replacement by Dr. Priscille Kluver.She takes an occasional Celebrex but plans  to wean herself off of it.  Current Outpatient Prescriptions  Medication Sig Dispense Refill  . aspirin 81 MG tablet Take 81 mg by mouth daily.        Marland Kitchen atenolol (TENORMIN) 50 MG tablet Take 1 tablet (50 mg total) by mouth 2 (two) times daily.  180 tablet  3  . Celecoxib (CELEBREX PO) Take by mouth daily.        . hydrochlorothiazide (,MICROZIDE/HYDRODIURIL,) 12.5 MG capsule Take 1 capsule (12.5 mg total) by mouth daily.  90 capsule  3  . losartan (COZAAR) 100 MG tablet Take 1 tablet (100 mg total) by mouth daily.  90 tablet  3  . nitroGLYCERIN (NITROSTAT) 0.4 MG SL tablet Place 0.4 mg under the tongue every 5 (five) minutes as needed.          Allergies  Allergen Reactions  . Lisinopril Cough    Patient Active Problem List  Diagnoses  . History of valvular heart disease  . Benign hypertensive heart disease without heart failure  . Osteoarthritis  . History of colon cancer  . Rectal cancer    History  Smoking status  . Never Smoker   Smokeless tobacco  . Not on file    History  Alcohol Use No    Family History  Problem Relation Age of Onset  . Colon cancer Maternal Grandmother   .  Heart disease Mother   . Heart disease Father     Review of Systems: Constitutional: no fever chills diaphoresis or fatigue or change in weight.  Head and neck: no hearing loss, no epistaxis, no photophobia or visual disturbance. Respiratory: No cough, shortness of breath or wheezing. Cardiovascular: No chest pain peripheral edema, palpitations. Gastrointestinal: No abdominal distention, no abdominal pain, no change in bowel habits hematochezia or melena. Genitourinary: No dysuria, no frequency, no urgency, no nocturia. Musculoskeletal:No arthralgias, no back pain, no gait disturbance or myalgias. Neurological: No dizziness, no headaches, no numbness, no seizures, no syncope, no weakness, no tremors. Hematologic: No lymphadenopathy, no easy bruising. Psychiatric: No confusion, no hallucinations, no sleep disturbance.    Physical Exam: Filed Vitals:   12/12/10 0902  BP: 118/70  Pulse: 60  The general appearance reveals a well-developed well-nourished woman in no distress.Pupils equal and reactive.   Extraocular Movements are full.  There is no scleral icterus.  The mouth and pharynx are normal.  The neck is supple.  The carotids reveal no bruits.  The jugular venous pressure is normal.  The thyroid is not enlarged.  There is no lymphadenopathy.  The chest is clear to percussion and auscultation. There are no rales or rhonchi. Expansion  of the chest is symmetrical.    The heart reveals a soft systolic ejection murmur across the aortic valve.The abdomen is soft and nontender. Bowel sounds are normal. The liver and spleen are not enlarged. There Are no abdominal masses. There are no bruits.  The pedal pulses are good.  There is no phlebitis or edema.  There is no cyanosis or clubbing.  Strength is normal and symmetrical in all extremities.  There is no lateralizing weakness.  There are no sensory deficits.  The skin is warm and dry.  There is no rash.     Assessment / Plan: Recheck  in 4 months for followup office visit and lab work.  Continue same meds

## 2010-12-12 NOTE — Assessment & Plan Note (Signed)
Presently she takes Celebrex for her arthritis but when her supply and as she does not plan on refilling it but instead she will switch to Tylenol

## 2010-12-12 NOTE — Assessment & Plan Note (Signed)
No symptoms of congestive heart failure or angina pectoris.  She has occasional peripheral edema.

## 2010-12-12 NOTE — Assessment & Plan Note (Signed)
She has not had any recurrent rectal bleeding or other symptoms from her colon.

## 2010-12-12 NOTE — Assessment & Plan Note (Signed)
The patient has not been expressing any headaches or dizziness.  No symptoms of congestive heart failure.

## 2010-12-13 ENCOUNTER — Telehealth: Payer: Self-pay | Admitting: *Deleted

## 2010-12-13 NOTE — Telephone Encounter (Signed)
Message copied by Royanne Foots on Fri Dec 13, 2010 10:15 AM ------      Message from: Cassell Clement      Created: Thu Dec 12, 2010  9:29 PM       Please report.The kidney function and blood sugar are normal.  The liver tests are normal.  The lipids are normal.The CBC shows improvement in her hemoglobin Up from 10.1 to 11.9

## 2010-12-13 NOTE — Telephone Encounter (Signed)
Advised patient of results.  

## 2010-12-13 NOTE — Progress Notes (Signed)
Advised patient of results.  

## 2010-12-19 ENCOUNTER — Ambulatory Visit: Payer: Medicare Other | Admitting: Family Medicine

## 2011-01-02 LAB — CBC
HCT: 24.9 — ABNORMAL LOW
HCT: 25.6 — ABNORMAL LOW
HCT: 31.7 — ABNORMAL LOW
Hemoglobin: 10.2 — ABNORMAL LOW
Hemoglobin: 10.8 — ABNORMAL LOW
Hemoglobin: 8.6 — ABNORMAL LOW
MCHC: 34.2
MCHC: 34.2
MCHC: 34.8
MCHC: 34.9
MCHC: 35.1
MCV: 86.3
MCV: 87.7
Platelets: 221
Platelets: 253
Platelets: 285
Platelets: 314
Platelets: 393
RBC: 2.94 — ABNORMAL LOW
RBC: 3.35 — ABNORMAL LOW
RBC: 3.67 — ABNORMAL LOW
RDW: 13.1
RDW: 13.4
WBC: 10.7 — ABNORMAL HIGH
WBC: 2.7 — ABNORMAL LOW
WBC: 5.2
WBC: 6

## 2011-01-02 LAB — BASIC METABOLIC PANEL
BUN: 1 — ABNORMAL LOW
BUN: 11
BUN: 5 — ABNORMAL LOW
BUN: 14
CO2: 23
CO2: 25
CO2: 26
CO2: 26
CO2: 28
Calcium: 8 — ABNORMAL LOW
Calcium: 8.1 — ABNORMAL LOW
Calcium: 8.2 — ABNORMAL LOW
Calcium: 8.3 — ABNORMAL LOW
Calcium: 8.5
Calcium: 8.7
Chloride: 105
Chloride: 107
Chloride: 108
Chloride: 105
Creatinine, Ser: 0.61
Creatinine, Ser: 0.64
Creatinine, Ser: 0.69
Creatinine, Ser: 0.7
Creatinine, Ser: 0.89
Creatinine, Ser: 0.79
GFR calc Af Amer: >60
GFR calc Af Amer: >60
GFR calc non Af Amer: >60
GFR calc non Af Amer: >60
GFR calc non Af Amer: >60
Glucose, Bld: 124 — ABNORMAL HIGH
Glucose, Bld: 125 — ABNORMAL HIGH
Glucose, Bld: 153 — ABNORMAL HIGH
Glucose, Bld: 85
Potassium: 4.1
Potassium: 4.3
Sodium: 136
Sodium: 137
Sodium: 138

## 2011-01-02 LAB — COMPREHENSIVE METABOLIC PANEL
AST: 17
AST: 46 — ABNORMAL HIGH
Albumin: 2.2 — ABNORMAL LOW
Albumin: 2.2 — ABNORMAL LOW
Albumin: 2.7 — ABNORMAL LOW
Alkaline Phosphatase: 65
BUN: 4 — ABNORMAL LOW
CO2: 26
Calcium: 8.2 — ABNORMAL LOW
Calcium: 8.6
Chloride: 104
Chloride: 108
Creatinine, Ser: 0.64
GFR calc Af Amer: >60
GFR calc Af Amer: >60
Glucose, Bld: 135 — ABNORMAL HIGH
Potassium: 3.4 — ABNORMAL LOW
Potassium: 3.6
Sodium: 133 — ABNORMAL LOW
Total Bilirubin: 0.6
Total Protein: 4.9 — ABNORMAL LOW

## 2011-01-02 LAB — DIFFERENTIAL
Eosinophils Absolute: 0.1
Eosinophils Relative: 3
Lymphocytes Relative: 21
Lymphocytes Relative: 22
Lymphocytes Relative: 8 — ABNORMAL LOW
Lymphs Abs: 0.6 — ABNORMAL LOW
Lymphs Abs: 0.9
Lymphs Abs: 0.9
Monocytes Absolute: 0.4
Monocytes Absolute: 0.5
Monocytes Absolute: 0.5
Monocytes Relative: 12 — ABNORMAL HIGH
Monocytes Relative: 6
Monocytes Relative: 9
Neutro Abs: 2.7
Neutro Abs: 6.6
Neutrophils Relative %: 62

## 2011-01-02 LAB — URINALYSIS, ROUTINE W REFLEX MICROSCOPIC
Bilirubin Urine: NEGATIVE
Specific Gravity, Urine: 1.022
Urobilinogen, UA: 0.2

## 2011-01-02 LAB — ABO/RH: ABO/RH(D): B POS

## 2011-01-02 LAB — PHOSPHORUS
Phosphorus: 3
Phosphorus: 3.4
Phosphorus: 3.7

## 2011-01-02 LAB — PROTIME-INR
INR: 1
Prothrombin Time: 13.6

## 2011-01-02 LAB — URINE CULTURE
Colony Count: >=100000
Special Requests: NEGATIVE

## 2011-01-02 LAB — APTT: aPTT: 26

## 2011-01-02 LAB — URINE MICROSCOPIC-ADD ON

## 2011-01-02 LAB — TRIGLYCERIDES
Triglycerides: 140
Triglycerides: 166 — ABNORMAL HIGH

## 2011-01-02 LAB — CHOLESTEROL, TOTAL: Cholesterol: 118

## 2011-01-02 LAB — PREALBUMIN: Prealbumin: 9.8 — ABNORMAL LOW

## 2011-01-02 LAB — HEMOGLOBIN A1C: Mean Plasma Glucose: 126

## 2011-01-02 LAB — TYPE AND SCREEN

## 2011-01-02 LAB — MAGNESIUM: Magnesium: 2.3

## 2011-01-26 ENCOUNTER — Encounter (HOSPITAL_COMMUNITY): Payer: Self-pay | Admitting: *Deleted

## 2011-01-26 ENCOUNTER — Emergency Department (HOSPITAL_COMMUNITY)
Admission: EM | Admit: 2011-01-26 | Discharge: 2011-01-26 | Disposition: A | Discharge status: Home / Self Care | Payer: Medicare Other | Source: Home / Self Care | Attending: Emergency Medicine | Admitting: Emergency Medicine

## 2011-01-26 ENCOUNTER — Emergency Department (HOSPITAL_COMMUNITY): Payer: Medicare Other

## 2011-01-26 DIAGNOSIS — Z85038 Personal history of other malignant neoplasm of large intestine: Secondary | ICD-10-CM | POA: Insufficient documentation

## 2011-01-26 DIAGNOSIS — R197 Diarrhea, unspecified: Secondary | ICD-10-CM | POA: Insufficient documentation

## 2011-01-26 DIAGNOSIS — I10 Essential (primary) hypertension: Secondary | ICD-10-CM | POA: Insufficient documentation

## 2011-01-26 DIAGNOSIS — R112 Nausea with vomiting, unspecified: Secondary | ICD-10-CM | POA: Insufficient documentation

## 2011-01-26 DIAGNOSIS — Z79899 Other long term (current) drug therapy: Secondary | ICD-10-CM | POA: Insufficient documentation

## 2011-01-26 DIAGNOSIS — Z85048 Personal history of other malignant neoplasm of rectum, rectosigmoid junction, and anus: Secondary | ICD-10-CM | POA: Insufficient documentation

## 2011-01-26 DIAGNOSIS — R000 Tachycardia, unspecified: Secondary | ICD-10-CM | POA: Insufficient documentation

## 2011-01-26 DIAGNOSIS — R0682 Tachypnea, not elsewhere classified: Secondary | ICD-10-CM | POA: Insufficient documentation

## 2011-01-26 DIAGNOSIS — M1990 Unspecified osteoarthritis, unspecified site: Secondary | ICD-10-CM | POA: Insufficient documentation

## 2011-01-26 DIAGNOSIS — Z86718 Personal history of other venous thrombosis and embolism: Secondary | ICD-10-CM | POA: Insufficient documentation

## 2011-01-26 DIAGNOSIS — Z9889 Other specified postprocedural states: Secondary | ICD-10-CM | POA: Insufficient documentation

## 2011-01-26 DIAGNOSIS — R1031 Right lower quadrant pain: Secondary | ICD-10-CM | POA: Insufficient documentation

## 2011-01-26 HISTORY — DX: Unspecified osteoarthritis, unspecified site: M19.90

## 2011-01-26 LAB — COMPREHENSIVE METABOLIC PANEL
ALT: 15 U/L (ref 0–35)
Albumin: 3.9 g/dL (ref 3.5–5.2)
Alkaline Phosphatase: 95 U/L (ref 39–117)
BUN: 25 mg/dL — ABNORMAL HIGH (ref 6–23)
Chloride: 96 mEq/L (ref 96–112)
GFR calc Af Amer: 61 mL/min — ABNORMAL LOW (ref >90)
Glucose, Bld: 148 mg/dL — ABNORMAL HIGH (ref 70–99)
Potassium: 3.5 mEq/L (ref 3.5–5.1)
Sodium: 132 mEq/L — ABNORMAL LOW (ref 135–145)
Total Bilirubin: 0.8 mg/dL (ref 0.3–1.2)

## 2011-01-26 LAB — CBC
HCT: 35.3 % — ABNORMAL LOW (ref 36.0–46.0)
Hemoglobin: 11.9 g/dL — ABNORMAL LOW (ref 12.0–15.0)
MCH: 30.1 pg (ref 26.0–34.0)
MCHC: 33.7 g/dL (ref 30.0–36.0)
MCV: 89.4 fL (ref 78.0–100.0)
Platelets: 260 K/uL (ref 150–400)
RBC: 3.95 MIL/uL (ref 3.87–5.11)
RDW: 12.3 % (ref 11.5–15.5)
WBC: 4.6 10*3/uL (ref 4.0–10.5)

## 2011-01-26 LAB — DIFFERENTIAL
Basophils Absolute: 0 10*3/uL (ref 0.0–0.1)
Basophils Relative: 0 % (ref 0–1)
Eosinophils Absolute: 0 K/uL (ref 0.0–0.7)
Eosinophils Relative: 0 % (ref 0–5)
Lymphocytes Relative: 8 % — ABNORMAL LOW (ref 12–46)
Lymphs Abs: 0.4 K/uL — ABNORMAL LOW (ref 0.7–4.0)
Monocytes Absolute: 0.6 K/uL (ref 0.1–1.0)
Monocytes Relative: 14 % — ABNORMAL HIGH (ref 3–12)
Neutro Abs: 3.6 10*3/uL (ref 1.7–7.7)
Neutrophils Relative %: 78 % — ABNORMAL HIGH (ref 43–77)

## 2011-01-26 LAB — COMPREHENSIVE METABOLIC PANEL WITH GFR
AST: 20 U/L (ref 0–37)
CO2: 21 meq/L (ref 19–32)
Calcium: 9.6 mg/dL (ref 8.4–10.5)
Creatinine, Ser: 1 mg/dL (ref 0.50–1.10)
GFR calc non Af Amer: 53 mL/min — ABNORMAL LOW (ref >90)
Total Protein: 7.6 g/dL (ref 6.0–8.3)

## 2011-01-26 LAB — LIPASE, BLOOD: Lipase: 22 U/L (ref 11–59)

## 2011-01-26 MED ORDER — IOHEXOL 300 MG/ML  SOLN
100.0000 mL | Freq: Once | INTRAMUSCULAR | Status: AC | PRN
  Administered 2011-01-26: 100 mL via INTRAVENOUS

## 2011-01-26 NOTE — ED Notes (Signed)
Information obtained from [facility] chart print off, entered into ASAP by Jackey Loge, RN

## 2011-01-26 NOTE — ED Notes (Signed)
Transcribed from EMSTAT chart written by Al Corpus, RN by Alexandria Lodge.  Pt returns here to [place] tonight, came straight to ED, c/o nvd, h/o colon cancer, worse today, decreased PO intake.  Also stomach pain. (denies fever or bleeding).  Vomiting in triage, last BM 1 hour ago (watery).

## 2011-01-27 ENCOUNTER — Telehealth: Payer: Self-pay | Admitting: Cardiology

## 2011-01-27 ENCOUNTER — Encounter (HOSPITAL_COMMUNITY): Payer: Self-pay | Admitting: *Deleted

## 2011-01-27 ENCOUNTER — Inpatient Hospital Stay (HOSPITAL_COMMUNITY)
Admission: EM | Admit: 2011-01-27 | Discharge: 2011-02-23 | DRG: 330 | Disposition: A | Discharge status: Home Health | Payer: Medicare Other | Attending: General Surgery | Admitting: General Surgery

## 2011-01-27 DIAGNOSIS — Z79899 Other long term (current) drug therapy: Secondary | ICD-10-CM

## 2011-01-27 DIAGNOSIS — Z7982 Long term (current) use of aspirin: Secondary | ICD-10-CM

## 2011-01-27 DIAGNOSIS — I959 Hypotension, unspecified: Secondary | ICD-10-CM

## 2011-01-27 DIAGNOSIS — I359 Nonrheumatic aortic valve disorder, unspecified: Secondary | ICD-10-CM | POA: Diagnosis present

## 2011-01-27 DIAGNOSIS — Z96659 Presence of unspecified artificial knee joint: Secondary | ICD-10-CM

## 2011-01-27 DIAGNOSIS — I119 Hypertensive heart disease without heart failure: Secondary | ICD-10-CM | POA: Diagnosis present

## 2011-01-27 DIAGNOSIS — T8140XA Infection following a procedure, unspecified, initial encounter: Secondary | ICD-10-CM | POA: Diagnosis not present

## 2011-01-27 DIAGNOSIS — K929 Disease of digestive system, unspecified: Secondary | ICD-10-CM | POA: Diagnosis not present

## 2011-01-27 DIAGNOSIS — N179 Acute kidney failure, unspecified: Secondary | ICD-10-CM

## 2011-01-27 DIAGNOSIS — Z86718 Personal history of other venous thrombosis and embolism: Secondary | ICD-10-CM

## 2011-01-27 DIAGNOSIS — D62 Acute posthemorrhagic anemia: Secondary | ICD-10-CM

## 2011-01-27 DIAGNOSIS — E46 Unspecified protein-calorie malnutrition: Secondary | ICD-10-CM | POA: Diagnosis not present

## 2011-01-27 DIAGNOSIS — A0472 Enterocolitis due to Clostridium difficile, not specified as recurrent: Secondary | ICD-10-CM | POA: Diagnosis not present

## 2011-01-27 DIAGNOSIS — N289 Disorder of kidney and ureter, unspecified: Secondary | ICD-10-CM | POA: Diagnosis present

## 2011-01-27 DIAGNOSIS — Z85048 Personal history of other malignant neoplasm of rectum, rectosigmoid junction, and anus: Secondary | ICD-10-CM

## 2011-01-27 DIAGNOSIS — M1610 Unilateral primary osteoarthritis, unspecified hip: Secondary | ICD-10-CM | POA: Diagnosis present

## 2011-01-27 DIAGNOSIS — K56609 Unspecified intestinal obstruction, unspecified as to partial versus complete obstruction: Secondary | ICD-10-CM | POA: Clinically undetermined

## 2011-01-27 DIAGNOSIS — R197 Diarrhea, unspecified: Secondary | ICD-10-CM

## 2011-01-27 DIAGNOSIS — M169 Osteoarthritis of hip, unspecified: Secondary | ICD-10-CM | POA: Diagnosis present

## 2011-01-27 DIAGNOSIS — E860 Dehydration: Secondary | ICD-10-CM

## 2011-01-27 DIAGNOSIS — K560 Paralytic ileus: Secondary | ICD-10-CM | POA: Diagnosis not present

## 2011-01-27 DIAGNOSIS — R002 Palpitations: Secondary | ICD-10-CM

## 2011-01-27 DIAGNOSIS — I10 Essential (primary) hypertension: Secondary | ICD-10-CM

## 2011-01-27 DIAGNOSIS — E876 Hypokalemia: Secondary | ICD-10-CM

## 2011-01-27 DIAGNOSIS — Y836 Removal of other organ (partial) (total) as the cause of abnormal reaction of the patient, or of later complication, without mention of misadventure at the time of the procedure: Secondary | ICD-10-CM | POA: Diagnosis not present

## 2011-01-27 DIAGNOSIS — K5289 Other specified noninfective gastroenteritis and colitis: Principal | ICD-10-CM | POA: Diagnosis present

## 2011-01-27 DIAGNOSIS — K565 Intestinal adhesions [bands], unspecified as to partial versus complete obstruction: Secondary | ICD-10-CM | POA: Diagnosis not present

## 2011-01-27 DIAGNOSIS — Z85038 Personal history of other malignant neoplasm of large intestine: Secondary | ICD-10-CM

## 2011-01-27 DIAGNOSIS — Y9210 Unspecified residential institution as the place of occurrence of the external cause: Secondary | ICD-10-CM | POA: Diagnosis not present

## 2011-01-27 DIAGNOSIS — E871 Hypo-osmolality and hyponatremia: Secondary | ICD-10-CM | POA: Diagnosis present

## 2011-01-27 LAB — COMPREHENSIVE METABOLIC PANEL
ALT: 13 U/L (ref 0–35)
BUN: 57 mg/dL — ABNORMAL HIGH (ref 6–23)
CO2: 18 mEq/L — ABNORMAL LOW (ref 19–32)
Calcium: 9.1 mg/dL (ref 8.4–10.5)
Creatinine, Ser: 3.33 mg/dL — ABNORMAL HIGH (ref 0.50–1.10)
GFR calc Af Amer: 14 mL/min — ABNORMAL LOW (ref >90)
GFR calc non Af Amer: 12 mL/min — ABNORMAL LOW (ref >90)
Glucose, Bld: 126 mg/dL — ABNORMAL HIGH (ref 70–99)
Sodium: 127 mEq/L — ABNORMAL LOW (ref 135–145)
Total Protein: 7.4 g/dL (ref 6.0–8.3)

## 2011-01-27 LAB — CBC
HCT: 33.9 % — ABNORMAL LOW (ref 36.0–46.0)
MCH: 29.9 pg (ref 26.0–34.0)
MCHC: 34.8 g/dL (ref 30.0–36.0)
RDW: 12.3 % (ref 11.5–15.5)

## 2011-01-27 LAB — DIFFERENTIAL
Basophils Absolute: 0 10*3/uL (ref 0.0–0.1)
Basophils Relative: 0 % (ref 0–1)
Eosinophils Absolute: 0 10*3/uL (ref 0.0–0.7)
Lymphocytes Relative: 14 % (ref 12–46)
Monocytes Absolute: 1 10*3/uL (ref 0.1–1.0)
Neutro Abs: 2.9 10*3/uL (ref 1.7–7.7)

## 2011-01-27 MED ORDER — SODIUM CHLORIDE 0.9 % IV BOLUS (SEPSIS)
750.0000 mL | Freq: Once | INTRAVENOUS | Status: AC
  Administered 2011-01-28: 1000 mL via INTRAVENOUS

## 2011-01-27 MED ORDER — LOPERAMIDE HCL 2 MG PO CAPS
4.0000 mg | ORAL_CAPSULE | ORAL | Status: DC | PRN
  Administered 2011-01-27: 4 mg via ORAL
  Filled 2011-01-27: qty 2

## 2011-01-27 MED ORDER — SODIUM CHLORIDE 0.9 % IV BOLUS (SEPSIS)
750.0000 mL | Freq: Once | INTRAVENOUS | Status: AC
  Administered 2011-01-27: 1000 mL via INTRAVENOUS

## 2011-01-27 MED ORDER — SODIUM CHLORIDE 0.9 % IV SOLN
INTRAVENOUS | Status: DC
  Administered 2011-01-27 – 2011-01-28 (×3): via INTRAVENOUS

## 2011-01-27 MED ORDER — PROMETHAZINE HCL 25 MG/ML IJ SOLN
12.5000 mg | Freq: Once | INTRAMUSCULAR | Status: DC
  Administered 2011-01-28: 12.5 mg via INTRAVENOUS
  Filled 2011-01-27: qty 1

## 2011-01-27 MED ORDER — ONDANSETRON HCL 4 MG/2ML IJ SOLN
4.0000 mg | INTRAMUSCULAR | Status: DC | PRN
  Administered 2011-01-27: 4 mg via INTRAVENOUS
  Filled 2011-01-27: qty 2

## 2011-01-27 NOTE — Telephone Encounter (Signed)
Cira Servant.  Patient does have an appointment today with Dr Evette Cristal at 3:00

## 2011-01-27 NOTE — ED Notes (Signed)
History of Present Illness   Patient Identification Priscilla [name] is a 77 y.o. female.  Patient information was obtained from patient and relative(s). History/Exam limitations: none. Patient presented to the Emergency Department by private vehicle.  Chief Complaint  Emesis   Patient presents for evaluation of abdominal pain. Onset of symptoms was gradual starting 5 day ago with unchanged course since that time. The pain is located periumbilical bilateral without radiation. The pain is rated as moderate and severe. The pain is made worse by coughing, food, physical activity and vomiting and is relieved by nothing. The patient also complains of diarrhea, nausea, sweats and vomiting. The patient denies headache. The past workup has included CT scan. The pertinent past history includes colon CA. The patient denies gallstones, Crohn's disease. Home care consisted of self care with minimal relief.  Past Medical History  Diagnosis Date  . Hypertension   . Chest pain, atypical   . OA (osteoarthritis)   . IDA (iron deficiency anemia)   . Cancer     colon  . Heart murmur   . Deep vein thrombosis   . Osteoarthritis    Family History  Problem Relation Age of Onset  . Colon cancer Maternal Grandmother   . Heart disease Mother   . Heart disease Father   [phone]) Current Facility-Administered Medications  Medication Dose Route Frequency Provider Last Rate Last Dose  . 0.9 %  sodium chloride infusion   Intravenous Continuous Ward Givens, MD 100 mL/hr at 01/27/11 2153    . loperamide (IMODIUM) capsule 4 mg  4 mg Oral PRN Ward Givens, MD   4 mg at 01/27/11 2144  . ondansetron (ZOFRAN) injection 4 mg  4 mg Intravenous Q1H PRN Ward Givens, MD   4 mg at 01/27/11 2142  . sodium chloride 0.9 % bolus 750 mL  750 mL Intravenous Once Ward Givens, MD   1,000 mL at 01/27/11 2152   Current Outpatient Prescriptions  Medication Sig Dispense Refill  . aspirin 81 MG tablet Take 81 mg by mouth daily.         Marland Kitchen atenolol (TENORMIN) 50 MG tablet Take 1 tablet (50 mg total) by mouth 2 (two) times daily.  180 tablet  3  . Diphenhydramine-APAP, sleep, (TYLENOL PM EXTRA STRENGTH PO) Take 1-2 tablets by mouth at bedtime. For sleep       . hydrochlorothiazide (,MICROZIDE/HYDRODIURIL,) 12.5 MG capsule Take 1 capsule (12.5 mg total) by mouth daily.  90 capsule  3  . losartan (COZAAR) 100 MG tablet Take 1 tablet (100 mg total) by mouth daily.  90 tablet  3  . nitroGLYCERIN (NITROSTAT) 0.4 MG SL tablet Place 0.4 mg under the tongue every 5 (five) minutes as needed. For chest pain.       Allergies  Allergen Reactions  . Morphine And Related Nausea And Vomiting  . Lisinopril Cough   History   Social History  . Marital Status: Widowed    Spouse Name: N/A    Number of Children: N/A  . Years of Education: N/A   Occupational History  . Not on file.   Social History Main Topics  . Smoking status: Never Smoker   . Smokeless tobacco: Not on file  . Alcohol Use: No  . Drug Use: No  . Sexually Active:    Other Topics Concern  . Not on file   Social History Narrative  . No narrative on file   Review of Systems Pertinent items are  noted in HPI.   Physical Exam   BP 78/38  Pulse 85  Temp(Src) 97.9 F (36.6 C) (Oral)  Resp 22  SpO2 93% No exam performed today, .  ED Course   Studies: Lab: None Imaging: None  Records Reviewed: Old medical records.  Treatments: Antiemetics given.  Consultations: none at this time  Disposition: none at this time

## 2011-01-27 NOTE — ED Provider Notes (Signed)
History     CSN: [id number] Arrival date & time: 01/27/2011  4:45 PM   First MD Initiated Contact with Patient 01/27/11 2041      Chief Complaint  Patient presents with  . Emesis    (Consider location/radiation/quality/duration/timing/severity/associated sxs/prior treatment) HPI Patient relates she started feeling ill 3 days ago with nausea vomiting and diarrhea. She states she is having watery diarrhea several times an hour and she is vomiting many times a day. She denies fever. She has pain in her upper and lower abdomen. She states she feels weak and feels like she's going to fall when she stands up. She denies been on antibiotics recently. She denies any known sick contacts. She was seen in the ER yesterday morning and had a CT scan suggestive of enteritis and was given IV fluids. She relates she thought she was doing better but as soon as she walked in her house she started having symptoms return. Nothing makes her feel worse nothing makes her feel better.  Primary care none Cardiologist Dr. Patty Sermons (used to be her primary but not since October) Gastroenterologist Dr. Evette Cristal   Past Medical History  Diagnosis Date  . Hypertension   . Chest pain, atypical   . OA (osteoarthritis)   . IDA (iron deficiency anemia)   . Cancer     colon  . Heart murmur   . Deep vein thrombosis   . Osteoarthritis     Past Surgical History  Procedure Date  . Nuclear stress test 11/17/08    NORMAL  . Replacement total knee     RIGHT KNEE  . Colonoscopy 02/2010  . Ileostomy 04/08/2007  . Colon surgery 2001 and 2008  . Joint replacement 2010    knee   . Appendectomy   . Colectomy   . Colostomy   . Abdominal hysterectomy     Family History  Problem Relation Age of Onset  . Colon cancer Maternal Grandmother   . Heart disease Mother   . Heart disease Father     History  Substance Use Topics  . Smoking status: Never Smoker   . Smokeless tobacco: Not on file  . Alcohol Use: No   patient lives alone  OB History    Grav Para Term Preterm Abortions TAB SAB Ect Mult Living                  Review of Systems  All other systems reviewed and are negative.    Allergies  Morphine and related and Lisinopril  Home Medications   Current Outpatient Rx  Name Route Sig Dispense Refill  . ASPIRIN 81 MG PO TABS Oral Take 81 mg by mouth daily.      . ATENOLOL 50 MG PO TABS Oral Take 1 tablet (50 mg total) by mouth 2 (two) times daily. 180 tablet 3  . TYLENOL PM EXTRA STRENGTH PO Oral Take 1-2 tablets by mouth at bedtime. For sleep     . HYDROCHLOROTHIAZIDE 12.5 MG PO CAPS Oral Take 1 capsule (12.5 mg total) by mouth daily. 90 capsule 3  . LOSARTAN POTASSIUM 100 MG PO TABS Oral Take 1 tablet (100 mg total) by mouth daily. 90 tablet 3  . NITROGLYCERIN 0.4 MG SL SUBL Sublingual Place 0.4 mg under the tongue every 5 (five) minutes as needed. For chest pain.      BP 94/31  Pulse 77  Temp(Src) 98.8 F (37.1 C) (Oral)  Resp 29  SpO2 98%  Vital signs, hypotension otherwise  normal  Physical Exam  Vitals reviewed. Constitutional: She is oriented to person, place, and time. She appears well-developed and well-nourished.       Patient looks like she feels bad, she is dry heaving on my arrival in the room  HENT:  Head: Normocephalic and atraumatic.  Mouth/Throat: Uvula is midline. Mucous membranes are pale, dry and not cyanotic. No oropharyngeal exudate, posterior oropharyngeal edema or posterior oropharyngeal erythema.       Mucous membranes are dry  Eyes: Conjunctivae and EOM are normal. Pupils are equal, round, and reactive to light.  Cardiovascular: Normal rate, regular rhythm and normal heart sounds.   Pulmonary/Chest: Effort normal and breath sounds normal.  Abdominal: Soft. She exhibits no distension. There is no rebound and no guarding.       Patient is noted to have some tenderness in her suprapubic area and then also mildly diffusely everywhere else    Musculoskeletal: Normal range of motion.       Pt has no acute abnormality.    Neurological: She is alert and oriented to person, place, and time.  Skin: Skin is warm and dry. There is pallor.       Patient is extremely pale  Psychiatric: Her behavior is normal. Judgment and thought content normal.       Affect is very flat    ED Course  Procedures (including critical care time)  Results for orders placed during the hospital encounter of 01/27/11  CBC      Component Value Range   WBC 4.5  4.0 - 10.5 (K/uL)   RBC 3.94  3.87 - 5.11 (MIL/uL)   Hemoglobin 11.8 (*) 12.0 - 15.0 (g/dL)   HCT 16.1 (*) 09.6 - 46.0 (%)   MCV 86.0  78.0 - 100.0 (fL)   MCH 29.9  26.0 - 34.0 (pg)   MCHC 34.8  30.0 - 36.0 (g/dL)   RDW 04.5  40.9 - 81.1 (%)   Platelets 260  150 - 400 (K/uL)  DIFFERENTIAL      Component Value Range   Neutrophils Relative 64  43 - 77 (%)   Lymphocytes Relative 14  12 - 46 (%)   Monocytes Relative 22 (*) 3 - 12 (%)   Eosinophils Relative 0  0 - 5 (%)   Basophils Relative 0  0 - 1 (%)   Neutro Abs 2.9  1.7 - 7.7 (K/uL)   Lymphs Abs 0.6 (*) 0.7 - 4.0 (K/uL)   Monocytes Absolute 1.0  0.1 - 1.0 (K/uL)   Eosinophils Absolute 0.0  0.0 - 0.7 (K/uL)   Basophils Absolute 0.0  0.0 - 0.1 (K/uL)   RBC Morphology POLYCHROMASIA PRESENT     WBC Morphology MILD LEFT SHIFT (1-5% METAS, OCC MYELO, OCC BANDS)    COMPREHENSIVE METABOLIC PANEL      Component Value Range   Sodium 127 (*) 135 - 145 (mEq/L)   Potassium 3.6  3.5 - 5.1 (mEq/L)   Chloride 89 (*) 96 - 112 (mEq/L)   CO2 18 (*) 19 - 32 (mEq/L)   Glucose, Bld 126 (*) 70 - 99 (mg/dL)   BUN 57 (*) 6 - 23 (mg/dL)   Creatinine, Ser 9.14 (*) 0.50 - 1.10 (mg/dL)   Calcium 9.1  8.4 - 78.2 (mg/dL)   Total Protein 7.4  6.0 - 8.3 (g/dL)   Albumin 3.2 (*) 3.5 - 5.2 (g/dL)   AST 22  0 - 37 (U/L)   ALT 13  0 - 35 (U/L)  Alkaline Phosphatase 101  39 - 117 (U/L)   Total Bilirubin 0.8  0.3 - 1.2 (mg/dL)   GFR calc non Af Amer 12 (*) >90  (mL/min)   GFR calc Af Amer 14 (*) >90 (mL/min)   Laboratory impression since yesterday she now has hyponatremia and mild metabolic acidosis and renal insufficiency, she has mild anemia unchanged from yesterday. Pt has not had any urine output yet.   Ct Abdomen Pelvis W Contrast  01/26/2011  *RADIOLOGY REPORT*  Clinical Data: Diarrhea, vomiting.  CT ABDOMEN AND PELVIS WITH CONTRAST  Technique:  Multidetector CT imaging of the abdomen and pelvis was performed following the standard protocol during bolus administration of intravenous contrast.  Contrast: OMNIPAQUE IOHEXOL 300 MG/ML IV SOLN  Comparison: 12/24/2009  Findings: Minimal left lung base scarring versus atelectasis. Heart size upper normal limits to mildly enlarged.  No pericardial or pleural effusion.  Small hiatal hernia.  Low attenuation of the liver suggests fatty infiltration. Unremarkable biliary system, spleen, pancreas, adrenal glands. Symmetric renal enhancement. Mild ureteral fullness on the right is similar to comparison.  No obstructing lesion identified.  No bowel obstruction. Small bowel anastomotic suture noted in the right lower quadrant anteriorly.    There is a segment of small bowel with mild wall thickening / mucosal hyperenhancement (image 64 series 2 for example).  Evidence of previous low anterior resection with no radiographic evidence for colitis. Evidence of an anastomotic suture in the right colon as well.  There are a couple small lymph nodes in this region which are nonspecific.  A peripherally calcified centrally fat attenuation focus within the right hemiabdomen is without interval change.  There is scattered atherosclerotic calcification of the aorta and its branches. No aneurysmal dilatation.  Thin-walled bladder.  Absent uterus.  Pelvic floor laxity.  Multilevel degenerative changes of the imaged spine. No acute or aggressive appearing osseous lesion.  IMPRESSION: Status post partial right colectomy and low  anterior resection.  No bowel obstruction however, there is a short segment small bowel which is mildly inflamed appearing as can be seen with a nonspecific enteritis.  There are a couple mildly prominent lymph nodes in the right lower quadrant which may be reactive.  Attention on follow-up.  Original Report Authenticated By: Waneta Martins, M.D.   Dg Abd Acute W/chest  01/26/2011  RADIOLOGY REPORT*  Clinical Data: Nausea, vomiting and diarrhea.  History of colon and rectal cancer.  ACUTE ABDOMINAL SERIES  Comparison: Previous examinations.  Findings: Normal sized heart.  Clear lungs.  Mildly prominent interstitial markings.  Scattered air fluid levels in normal- caliber colon and small bowel.  No free peritoneal air.  Surgical staples in the right lower abdomen and in the lower pelvis.  Right abdominal calcification without significant change since the CT dated 12/24/2009.  This had an appearance on the CT suggesting calcified fat necrosis.  Lumbar spine degenerative changes.  Mild scoliosis.  Diffuse osteopenia.  IMPRESSION:  1.  Scattered air fluid levels in normal-caliber colon and small bowel.  This can be seen with gastroenteritis.  Mild ileus is also a possibility.  Low grade partial obstruction is less likely. 2.  Mild chronic interstitial lung disease.  Original Report Authenticated By: Darrol Angel, M.D.     Course Patient given IV fluids IV nausea meds and oral Imodium which she immediately vomited. Her recheck blood pressure after her 750 cc bolus of fluid shows blood pressure 102/36. The Zofran was not controlling her nausea. She relates to  Phenergan given to her yesterday morning help. Phenergan IV was ordered  23:18 Dr Shawnie Dapper  Admit to triad team 3 medical bed   Diagnoses that have been ruled out:  Diagnoses that are still under consideration:  Final diagnoses:  Dehydration  Prerenal acute renal failure  Vomiting and diarrhea  Hypotension  Hyponatremia   Plan  admission   CRITICAL CARE Performed by: Devoria Albe L   Total critical care time:31 minutes Critical care time was exclusive of separately billable procedures and treating other patients.  Critical care was necessary to treat or prevent imminent or life-threatening deterioration.  Critical care was time spent personally by me on the following activities: development of treatment plan with patient and/or surrogate as well as nursing, discussions with consultants, evaluation of patient's response to treatment, examination of patient, obtaining history from patient or surrogate, ordering and performing treatments and interventions, ordering and review of laboratory studies, ordering and review of radiographic studies, pulse oximetry and re-evaluation of patient's condition.    MDM          Ward Givens, MD 01/27/11 2337

## 2011-01-27 NOTE — Telephone Encounter (Signed)
Patient has not had any of her medications since Thursday.  Went to emergency department over weekend and was given fluids and Zofran.  Still not eating or drinking, has diarrhea and was told she had a virus.  Advised to call Dr Evette Cristal, her gastroenterologist and they may even advise for her to go back to hospital.  They have no way of taking blood pressure at home.  Please advise on her medications, should she be taking.

## 2011-01-27 NOTE — Telephone Encounter (Signed)
New message:  Pt was seen in ED on Saturday and given Zofran for nausea.  She has not taken her heart meds since Thurs. They have a question regarding this and starting her back on her heart meds.  Please call her back.

## 2011-01-27 NOTE — ED Notes (Signed)
Family at bedside. 

## 2011-01-27 NOTE — ED Notes (Signed)
She has had abd pain vomiting and diarrhea since nov 1st and she was seen here Saturday night for the same but she is no better.  Alert but drowsy

## 2011-01-27 NOTE — Telephone Encounter (Signed)
While she is so sick she should leave off the hydrochlorothiazide and the losartan and reduce the atenolol to just a half a tablet twice a day until she feels better

## 2011-01-28 ENCOUNTER — Other Ambulatory Visit: Payer: Self-pay

## 2011-01-28 ENCOUNTER — Encounter (HOSPITAL_COMMUNITY): Payer: Self-pay | Admitting: Internal Medicine

## 2011-01-28 DIAGNOSIS — N179 Acute kidney failure, unspecified: Secondary | ICD-10-CM

## 2011-01-28 DIAGNOSIS — I959 Hypotension, unspecified: Secondary | ICD-10-CM

## 2011-01-28 DIAGNOSIS — E860 Dehydration: Secondary | ICD-10-CM

## 2011-01-28 DIAGNOSIS — R197 Diarrhea, unspecified: Secondary | ICD-10-CM

## 2011-01-28 LAB — URINE MICROSCOPIC-ADD ON

## 2011-01-28 LAB — COMPREHENSIVE METABOLIC PANEL
Albumin: 2.5 g/dL — ABNORMAL LOW (ref 3.5–5.2)
Alkaline Phosphatase: 82 U/L (ref 39–117)
BUN: 57 mg/dL — ABNORMAL HIGH (ref 6–23)
Calcium: 8 mg/dL — ABNORMAL LOW (ref 8.4–10.5)
Creatinine, Ser: 2.43 mg/dL — ABNORMAL HIGH (ref 0.50–1.10)
Potassium: 3.5 mEq/L (ref 3.5–5.1)
Total Protein: 6 g/dL (ref 6.0–8.3)

## 2011-01-28 LAB — URINALYSIS, ROUTINE W REFLEX MICROSCOPIC
Glucose, UA: NEGATIVE mg/dL
Protein, ur: 100 mg/dL — AB
pH: 5.5 (ref 5.0–8.0)

## 2011-01-28 LAB — CBC
HCT: 29.2 % — ABNORMAL LOW (ref 36.0–46.0)
Hemoglobin: 10.2 g/dL — ABNORMAL LOW (ref 12.0–15.0)
MCV: 86.9 fL (ref 78.0–100.0)
RBC: 3.36 MIL/uL — ABNORMAL LOW (ref 3.87–5.11)
WBC: 2.1 10*3/uL — ABNORMAL LOW (ref 4.0–10.5)

## 2011-01-28 LAB — MRSA PCR SCREENING: MRSA by PCR: NEGATIVE

## 2011-01-28 LAB — CLOSTRIDIUM DIFFICILE BY PCR: Toxigenic C. Difficile by PCR: NEGATIVE

## 2011-01-28 MED ORDER — SODIUM CHLORIDE 0.9 % IV BOLUS (SEPSIS)
500.0000 mL | Freq: Once | INTRAVENOUS | Status: AC
  Administered 2011-01-28: 500 mL via INTRAVENOUS

## 2011-01-28 MED ORDER — ASPIRIN 81 MG PO CHEW
81.0000 mg | CHEWABLE_TABLET | Freq: Every day | ORAL | Status: DC
  Administered 2011-01-28 – 2011-02-04 (×8): 81 mg via ORAL
  Filled 2011-01-28 (×8): qty 1

## 2011-01-28 MED ORDER — PROMETHAZINE HCL 25 MG/ML IJ SOLN
12.5000 mg | Freq: Four times a day (QID) | INTRAMUSCULAR | Status: DC | PRN
Start: 2011-01-28 — End: 2011-02-05
  Administered 2011-01-28 – 2011-02-03 (×8): 12.5 mg via INTRAVENOUS
  Filled 2011-01-28 (×12): qty 1

## 2011-01-28 MED ORDER — NITROGLYCERIN 0.4 MG SL SUBL: 0.4000 mg | SUBLINGUAL_TABLET | SUBLINGUAL | Status: DC | PRN

## 2011-01-28 MED ORDER — ACETAMINOPHEN 325 MG PO TABS
650.0000 mg | ORAL_TABLET | Freq: Four times a day (QID) | ORAL | Status: DC | PRN
  Administered 2011-01-28: 650 mg via ORAL
  Filled 2011-01-28: qty 2

## 2011-01-28 MED ORDER — ONDANSETRON HCL 4 MG/2ML IJ SOLN
4.0000 mg | Freq: Four times a day (QID) | INTRAMUSCULAR | Status: DC | PRN
  Administered 2011-01-28 – 2011-02-03 (×10): 4 mg via INTRAVENOUS
  Filled 2011-01-28 (×11): qty 2

## 2011-01-28 MED ORDER — FAMOTIDINE IN NACL 20-0.9 MG/50ML-% IV SOLN
20.0000 mg | Freq: Two times a day (BID) | INTRAVENOUS | Status: DC
  Administered 2011-01-28 – 2011-02-04 (×15): 20 mg via INTRAVENOUS
  Filled 2011-01-28 (×20): qty 50

## 2011-01-28 MED ORDER — SODIUM CHLORIDE 0.9 % IV SOLN
INTRAVENOUS | Status: DC
  Administered 2011-01-28 – 2011-01-29 (×2): via INTRAVENOUS

## 2011-01-28 MED ORDER — CIPROFLOXACIN IN D5W 400 MG/200ML IV SOLN
400.0000 mg | Freq: Two times a day (BID) | INTRAVENOUS | Status: DC
  Administered 2011-01-28: 400 mg via INTRAVENOUS
  Filled 2011-01-28 (×2): qty 200

## 2011-01-28 MED ORDER — ATENOLOL 50 MG PO TABS
50.0000 mg | ORAL_TABLET | Freq: Two times a day (BID) | ORAL | Status: DC
  Administered 2011-01-28 – 2011-02-05 (×17): 50 mg via ORAL
  Filled 2011-01-28 (×18): qty 1

## 2011-01-28 MED ORDER — HYDROMORPHONE HCL PF 1 MG/ML IJ SOLN
0.5000 mg | INTRAMUSCULAR | Status: DC | PRN
  Administered 2011-01-28 – 2011-01-30 (×6): 0.5 mg via INTRAVENOUS
  Filled 2011-01-28 (×7): qty 1

## 2011-01-28 MED ORDER — HEPARIN SODIUM (PORCINE) 5000 UNIT/ML IJ SOLN
5000.0000 [IU] | Freq: Three times a day (TID) | INTRAMUSCULAR | Status: DC
  Administered 2011-01-28 – 2011-02-04 (×22): 5000 [IU] via SUBCUTANEOUS
  Filled 2011-01-28 (×24): qty 1

## 2011-01-28 MED ORDER — METRONIDAZOLE IN NACL 5-0.79 MG/ML-% IV SOLN
500.0000 mg | Freq: Three times a day (TID) | INTRAVENOUS | Status: DC
Start: 2011-01-28 — End: 2011-02-03
  Administered 2011-01-28 – 2011-02-03 (×18): 500 mg via INTRAVENOUS
  Filled 2011-01-28 (×20): qty 100

## 2011-01-28 MED ORDER — CIPROFLOXACIN IN D5W 400 MG/200ML IV SOLN
400.0000 mg | INTRAVENOUS | Status: DC
  Administered 2011-01-29 – 2011-02-01 (×4): 400 mg via INTRAVENOUS
  Filled 2011-01-28 (×4): qty 200

## 2011-01-28 MED ORDER — ACETAMINOPHEN 650 MG RE SUPP: 650.0000 mg | Freq: Four times a day (QID) | RECTAL | Status: DC | PRN

## 2011-01-28 NOTE — Progress Notes (Signed)
01/28/2011 Utilization review completed. [facility], Tayvon Culley [place] [phone]

## 2011-01-28 NOTE — Progress Notes (Signed)
[facility] Consult Note  Referring Provider: No ref. provider found Primary Care Physician:  Cassell Clement, MD, MD Primary Gastroenterologist:  Dr.  Antony Contras Complaint: Diarrhea nausea and vomiting  HPI:  Priscilla [name] is an 77 y.o. white female who presents with a 5-6 day history of persistent nausea vomiting and watery diarrhea with diarrhea the initial symptom. She's had mild diffuse generalized abdominal pain. He was seen in the emergency room over the weekend and released to followup with her primary gastroenterologist yesterday. He saw her in the office and felt she needed admission. She was sent here and admitted. She had a temperature of 100.5 at one point with a slightly low white blood cell count and a CT scan suggesting some possible short segment ileitis. The patient denies any recent antibiotics, any sick contacts, or any recent travel. She does get or drinking water from a well. She has a history of rectal cancer status post colostomy 11 years ago with reversal 4 years ago. She had a followup colonoscopy one year ago which was unrevealing. She denies any rectal bleeding.   Past Medical History  Diagnosis Date  . Hypertension   . Chest pain, atypical   . OA (osteoarthritis)   . IDA (iron deficiency anemia)   . Heart murmur   . Deep vein thrombosis   . Osteoarthritis   . Cancer     colon, rectal    Past Surgical History  Procedure Date  . Nuclear stress test 11/17/08    NORMAL  . Replacement total knee     RIGHT KNEE  . Colonoscopy 02/2010  . Ileostomy 04/08/2007  . Colon surgery 2001 and 2008  . Appendectomy   . Colectomy   . Colostomy   . Abdominal hysterectomy   . Joint replacement 2010    knee     Medications Prior to Admission  Medication Dose Route Frequency Provider Last Rate Last Dose  . 0.9 %  sodium chloride infusion   Intravenous Continuous Arne Cleveland, MD 150 mL/hr at 01/28/11 0904    . acetaminophen (TYLENOL) tablet 650 mg  650 mg  Oral Q6H PRN Arne Cleveland, MD   650 mg at 01/28/11 1610   Or  . acetaminophen (TYLENOL) suppository 650 mg  650 mg Rectal Q6H PRN Arne Cleveland, MD      . aspirin chewable tablet 81 mg  81 mg Oral Daily Pandora Leiter      . atenolol (TENORMIN) tablet 50 mg  50 mg Oral BID Pandora Leiter      . ciprofloxacin (CIPRO) IVPB 400 mg  400 mg Intravenous Q12H Barrie Folk, MD      . famotidine (PEPCID) IVPB 20 mg  20 mg Intravenous Q12H Arne Cleveland, MD      . heparin injection 5,000 Units  5,000 Units Subcutaneous Q8H Pandora Leiter      . HYDROmorphone (DILAUDID) injection 0.5 mg  0.5 mg Intravenous Q4H PRN Arne Cleveland, MD   0.5 mg at 01/28/11 0338  . iohexol (OMNIPAQUE) 300 MG/ML injection 100 mL  100 mL Intravenous Once PRN Medication Radiologist   100 mL at 01/26/11 0404  . metroNIDAZOLE (FLAGYL) IVPB 500 mg  500 mg Intravenous Q8H Barrie Folk, MD      . nitroGLYCERIN (NITROSTAT) SL tablet 0.4 mg  0.4 mg Sublingual Q5 min PRN Arne Cleveland, MD      . ondansetron [facility]) injection 4 mg  4 mg Intravenous Q6H PRN Arne Cleveland,  MD      . promethazine (PHENERGAN) injection 12.5 mg  12.5 mg Intravenous Q6H PRN Arne Cleveland, MD      . sodium chloride 0.9 % bolus 500 mL  500 mL Intravenous Once Arne Cleveland, MD   500 mL at 01/28/11 0101  . sodium chloride 0.9 % bolus 750 mL  750 mL Intravenous Once Ward Givens, MD   1,000 mL at 01/27/11 2152  . sodium chloride 0.9 % bolus 750 mL  750 mL Intravenous Once Ward Givens, MD   1,000 mL at 01/28/11 0011  . DISCONTD: 0.9 %  sodium chloride infusion   Intravenous Continuous Ward Givens, MD 100 mL/hr at 01/28/11 0400    . DISCONTD: loperamide (IMODIUM) capsule 4 mg  4 mg Oral PRN Ward Givens, MD   4 mg at 01/27/11 2144  . DISCONTD: ondansetron (ZOFRAN) injection 4 mg  4 mg Intravenous Q1H PRN Ward Givens, MD   4 mg at 01/27/11 2142  . DISCONTD: promethazine (PHENERGAN) injection 12.5 mg  12.5 mg Intravenous Once Ward Givens, MD   12.5 mg at  01/28/11 0049   Medications Prior to Admission  Medication Sig Dispense Refill  . aspirin 81 MG tablet Take 81 mg by mouth daily.        Marland Kitchen atenolol (TENORMIN) 50 MG tablet Take 1 tablet (50 mg total) by mouth 2 (two) times daily.  180 tablet  3  . Diphenhydramine-APAP, sleep, (TYLENOL PM EXTRA STRENGTH PO) Take 1-2 tablets by mouth at bedtime. For sleep       . hydrochlorothiazide (,MICROZIDE/HYDRODIURIL,) 12.5 MG capsule Take 1 capsule (12.5 mg total) by mouth daily.  90 capsule  3  . losartan (COZAAR) 100 MG tablet Take 1 tablet (100 mg total) by mouth daily.  90 tablet  3  . nitroGLYCERIN (NITROSTAT) 0.4 MG SL tablet Place 0.4 mg under the tongue every 5 (five) minutes as needed. For chest pain.        Allergies:  Allergies  Allergen Reactions  . Morphine And Related Nausea And Vomiting  . Lisinopril Cough    Family History  Problem Relation Age of Onset  . Heart disease Mother   . Heart disease Father   . Lupus Father     Social History:  reports that she has never smoked. She does not have any smokeless tobacco history on file. She reports that she does not drink alcohol or use illicit drugs.  Negative except as mentioned above  Blood pressure 142/54, pulse 93, temperature 99.4 F (37.4 C), temperature source Oral, resp. rate 15, height 5\' 2"  (1.575 m), weight 70.8 kg (156 lb 1.4 oz), SpO2 94.00%. Head: Normocephalic, without obvious abnormality, atraumatic Neck: no adenopathy, no carotid bruit, no JVD, supple, symmetrical, trachea midline and thyroid not enlarged, symmetric, no tenderness/mass/nodules Resp: clear to auscultation bilaterally Cardio: regular rate and rhythm, S1, S2 normal, no murmur, click, rub or gallop GI: Abdomen soft, slightly distended with normoactive bowel sounds no hepatomegaly masses or guarding. There is mild diffuse tenderness without rebound. Extremities: extremities normal, atraumatic, no cyanosis or edema  Results for orders placed during the  hospital encounter of 01/27/11 (from the past 48 hour(s))  CBC     Status: Abnormal   Collection Time   01/27/11  9:31 PM      Component Value Range Comment   WBC 4.5  4.0 - 10.5 (K/uL)    RBC 3.94  3.87 - 5.11 (MIL/uL)  Hemoglobin 11.8 (*) 12.0 - 15.0 (g/dL)    HCT 16.1 (*) 09.6 - 46.0 (%)    MCV 86.0  78.0 - 100.0 (fL)    MCH 29.9  26.0 - 34.0 (pg)    MCHC 34.8  30.0 - 36.0 (g/dL)    RDW 04.5  40.9 - 81.1 (%)    Platelets 260  150 - 400 (K/uL)   DIFFERENTIAL     Status: Abnormal   Collection Time   01/27/11  9:31 PM      Component Value Range Comment   Neutrophils Relative 64  43 - 77 (%)    Lymphocytes Relative 14  12 - 46 (%)    Monocytes Relative 22 (*) 3 - 12 (%)    Eosinophils Relative 0  0 - 5 (%)    Basophils Relative 0  0 - 1 (%)    Neutro Abs 2.9  1.7 - 7.7 (K/uL)    Lymphs Abs 0.6 (*) 0.7 - 4.0 (K/uL)    Monocytes Absolute 1.0  0.1 - 1.0 (K/uL)    Eosinophils Absolute 0.0  0.0 - 0.7 (K/uL)    Basophils Absolute 0.0  0.0 - 0.1 (K/uL)    RBC Morphology POLYCHROMASIA PRESENT      WBC Morphology MILD LEFT SHIFT (1-5% METAS, OCC MYELO, OCC BANDS)     COMPREHENSIVE METABOLIC PANEL     Status: Abnormal   Collection Time   01/27/11  9:31 PM      Component Value Range Comment   Sodium 127 (*) 135 - 145 (mEq/L)    Potassium 3.6  3.5 - 5.1 (mEq/L)    Chloride 89 (*) 96 - 112 (mEq/L)    CO2 18 (*) 19 - 32 (mEq/L)    Glucose, Bld 126 (*) 70 - 99 (mg/dL)    BUN 57 (*) 6 - 23 (mg/dL)    Creatinine, Ser 9.14 (*) 0.50 - 1.10 (mg/dL)    Calcium 9.1  8.4 - 10.5 (mg/dL)    Total Protein 7.4  6.0 - 8.3 (g/dL)    Albumin 3.2 (*) 3.5 - 5.2 (g/dL)    AST 22  0 - 37 (U/L)    ALT 13  0 - 35 (U/L)    Alkaline Phosphatase 101  39 - 117 (U/L)    Total Bilirubin 0.8  0.3 - 1.2 (mg/dL)    GFR calc non Af Amer 12 (*) >90 (mL/min)    GFR calc Af Amer 14 (*) >90 (mL/min)   LACTIC ACID, PLASMA     Status: Normal   Collection Time   01/27/11 11:32 PM      Component Value Range Comment    Lactic Acid, Venous 1.4  0.5 - 2.2 (mmol/L)   MRSA PCR SCREENING     Status: Normal   Collection Time   01/28/11  2:36 AM      Component Value Range Comment   MRSA by PCR NEGATIVE  NEGATIVE    COMPREHENSIVE METABOLIC PANEL     Status: Abnormal   Collection Time   01/28/11  5:40 AM      Component Value Range Comment   Sodium 132 (*) 135 - 145 (mEq/L)    Potassium 3.5  3.5 - 5.1 (mEq/L)    Chloride 99  96 - 112 (mEq/L)    CO2 18 (*) 19 - 32 (mEq/L)    Glucose, Bld 117 (*) 70 - 99 (mg/dL)    BUN 57 (*) 6 - 23 (mg/dL)    Creatinine,  Ser 2.43 (*) 0.50 - 1.10 (mg/dL)    Calcium 8.0 (*) 8.4 - 10.5 (mg/dL)    Total Protein 6.0  6.0 - 8.3 (g/dL)    Albumin 2.5 (*) 3.5 - 5.2 (g/dL)    AST 17  0 - 37 (U/L)    ALT 11  0 - 35 (U/L)    Alkaline Phosphatase 82  39 - 117 (U/L)    Total Bilirubin 0.4  0.3 - 1.2 (mg/dL)    GFR calc non Af Amer 18 (*) >90 (mL/min)    GFR calc Af Amer 21 (*) >90 (mL/min)   CBC     Status: Abnormal   Collection Time   01/28/11  5:40 AM      Component Value Range Comment   WBC 2.1 (*) 4.0 - 10.5 (K/uL)    RBC 3.36 (*) 3.87 - 5.11 (MIL/uL)    Hemoglobin 10.2 (*) 12.0 - 15.0 (g/dL)    HCT 16.1 (*) 09.6 - 46.0 (%)    MCV 86.9  78.0 - 100.0 (fL)    MCH 30.4  26.0 - 34.0 (pg)    MCHC 34.9  30.0 - 36.0 (g/dL)    RDW 04.5  40.9 - 81.1 (%)    Platelets 190  150 - 400 (K/uL) DELTA CHECK NOTED   No results found.  Assessment: Six-day history of diarrhea nausea and vomiting with risk factor of well water, suggesting possible Giardia, as well as suggestion of mild terminal ileitis on CT scan. Plan:  Await pending stool studies and I would favor adding cryptosporidia and Giardia antigen as well as empiric treatment with Cipro and Flagyl pending clinical improvement or results of stool studies. Nafisah Runions C 01/28/2011, 9:38 AM

## 2011-01-28 NOTE — Progress Notes (Signed)
PROGRESS NOTE  Priscilla [name] ZOX:[id number] DOB: May 14, 1930 DOA: 01/27/2011  PCP: None. Cardiologist: Dr. Patty Sermons. Gastroenterologist: Dr. Evette Cristal  Brief narrative: Seen in the emergency room the night of November 3. She was given IV fluids at that time. CT showed a short segment of small bowel which was mildly inflamed in appearance as can be seen in nonspecific enteritis.  3 days of abdominal pain, vomiting and diarrhea since November 1. Found to be hypotensive. Admitted for diarrhea, dehydration, acute renal failure, nausea and vomiting, enteritis, hyponatremia.  Past medical history: Includes hypertension, iron deficiency anemia, colon cancer, DVT, valvular heart disease (moderate aortic insufficiency). Last seen by Dr. Patty Sermons September 20.  Subjective: Feels better this morning. Known vomiting or diarrhea today. Still has some abdominal pain. Still nauseous. Complains of left pain in her hip down to her knee. Has a history of osteoarthritis.  Objective: Temperature 99.4. Blood pressure 142/54. Respiratory rate 15, pulse 93.   Intake/Output Summary (Last 24 hours) at 01/28/11 0854 Last data filed at 01/28/11 0700  Gross per 24 hour  Intake 1117.5 ml  Output      0 ml  Net 1117.5 ml    Exam: General: Appears well no acute distress Cardiovascular: Regular rate and rhythm no rub or gallop. Respiratory: Clear to auscultation bilaterally. No wheezes, rales, rhonchi. Abdomen: Slightly tender in general. Nondistended. Skin: Appears grossly unremarkable. Musculoskeletal: The left leg appears grossly unremarkable. There is no bruising of the thigh. There is no pain with palpation of the thigh.  Assessment/Plan: Acute renal failure: Hypotension has resolved. Improving. Continue IV fluids. Repeat basic metabolic panel in the morning. Nausea, vomiting, diarrhea: Suspect viral gastroenteritis. Currently improved with IV fluids. Hold antibiotics for now. Stool culture and C. difficile  PCR pending Hyponatremia: Likely secondary to dehydration. Continue IV fluids. Leukopenia: Repeat CBC in the morning. Chronic normocytic anemia, below baseline: Repeat CBC in the morning. Hypertension: At this point continue to hold losartan and hydrochlorothiazide. Resume atenolol.  Transfer to the medical floor. Discontinue telemetry.    Code Status: Full Disposition Plan: Home when improved.    PCP: None.  LOS: 1 day   Pandora Leiter MD   Data Reviewed: Results for orders placed during the hospital encounter of 01/27/11 (from the past 24 hour(s))  CBC     Status: Abnormal   Collection Time   01/27/11  9:31 PM      Component Value Range   WBC 4.5  4.0 - 10.5 (K/uL)   RBC 3.94  3.87 - 5.11 (MIL/uL)   Hemoglobin 11.8 (*) 12.0 - 15.0 (g/dL)   HCT 81.1 (*) 91.4 - 46.0 (%)   MCV 86.0  78.0 - 100.0 (fL)   MCH 29.9  26.0 - 34.0 (pg)   MCHC 34.8  30.0 - 36.0 (g/dL)   RDW 78.2  95.6 - 21.3 (%)   Platelets 260  150 - 400 (K/uL)  DIFFERENTIAL     Status: Abnormal   Collection Time   01/27/11  9:31 PM      Component Value Range   Neutrophils Relative 64  43 - 77 (%)   Lymphocytes Relative 14  12 - 46 (%)   Monocytes Relative 22 (*) 3 - 12 (%)   Eosinophils Relative 0  0 - 5 (%)   Basophils Relative 0  0 - 1 (%)   Neutro Abs 2.9  1.7 - 7.7 (K/uL)   Lymphs Abs 0.6 (*) 0.7 - 4.0 (K/uL)   Monocytes Absolute 1.0  0.1 -  1.0 (K/uL)   Eosinophils Absolute 0.0  0.0 - 0.7 (K/uL)   Basophils Absolute 0.0  0.0 - 0.1 (K/uL)   RBC Morphology POLYCHROMASIA PRESENT     WBC Morphology MILD LEFT SHIFT (1-5% METAS, OCC MYELO, OCC BANDS)    COMPREHENSIVE METABOLIC PANEL     Status: Abnormal   Collection Time   01/27/11  9:31 PM      Component Value Range   Sodium 127 (*) 135 - 145 (mEq/L)   Potassium 3.6  3.5 - 5.1 (mEq/L)   Chloride 89 (*) 96 - 112 (mEq/L)   CO2 18 (*) 19 - 32 (mEq/L)   Glucose, Bld 126 (*) 70 - 99 (mg/dL)   BUN 57 (*) 6 - 23 (mg/dL)   Creatinine, Ser 1.61 (*) 0.50  - 1.10 (mg/dL)   Calcium 9.1  8.4 - 09.6 (mg/dL)   Total Protein 7.4  6.0 - 8.3 (g/dL)   Albumin 3.2 (*) 3.5 - 5.2 (g/dL)   AST 22  0 - 37 (U/L)   ALT 13  0 - 35 (U/L)   Alkaline Phosphatase 101  39 - 117 (U/L)   Total Bilirubin 0.8  0.3 - 1.2 (mg/dL)   GFR calc non Af Amer 12 (*) >90 (mL/min)   GFR calc Af Amer 14 (*) >90 (mL/min)  LACTIC ACID, PLASMA     Status: Normal   Collection Time   01/27/11 11:32 PM      Component Value Range   Lactic Acid, Venous 1.4  0.5 - 2.2 (mmol/L)  MRSA PCR SCREENING     Status: Normal   Collection Time   01/28/11  2:36 AM      Component Value Range   MRSA by PCR NEGATIVE  NEGATIVE   COMPREHENSIVE METABOLIC PANEL     Status: Abnormal   Collection Time   01/28/11  5:40 AM      Component Value Range   Sodium 132 (*) 135 - 145 (mEq/L)   Potassium 3.5  3.5 - 5.1 (mEq/L)   Chloride 99  96 - 112 (mEq/L)   CO2 18 (*) 19 - 32 (mEq/L)   Glucose, Bld 117 (*) 70 - 99 (mg/dL)   BUN 57 (*) 6 - 23 (mg/dL)   Creatinine, Ser 0.45 (*) 0.50 - 1.10 (mg/dL)   Calcium 8.0 (*) 8.4 - 10.5 (mg/dL)   Total Protein 6.0  6.0 - 8.3 (g/dL)   Albumin 2.5 (*) 3.5 - 5.2 (g/dL)   AST 17  0 - 37 (U/L)   ALT 11  0 - 35 (U/L)   Alkaline Phosphatase 82  39 - 117 (U/L)   Total Bilirubin 0.4  0.3 - 1.2 (mg/dL)   GFR calc non Af Amer 18 (*) >90 (mL/min)   GFR calc Af Amer 21 (*) >90 (mL/min)  CBC     Status: Abnormal   Collection Time   01/28/11  5:40 AM      Component Value Range   WBC 2.1 (*) 4.0 - 10.5 (K/uL)   RBC 3.36 (*) 3.87 - 5.11 (MIL/uL)   Hemoglobin 10.2 (*) 12.0 - 15.0 (g/dL)   HCT 40.9 (*) 81.1 - 46.0 (%)   MCV 86.9  78.0 - 100.0 (fL)   MCH 30.4  26.0 - 34.0 (pg)   MCHC 34.9  30.0 - 36.0 (g/dL)   RDW 91.4  78.2 - 95.6 (%)   Platelets 190  150 - 400 (K/uL)    Recent Results (from the past 240 hour(s))  MRSA  PCR SCREENING     Status: Normal   Collection Time   01/28/11  2:36 AM      Component Value Range Status Comment   MRSA by PCR NEGATIVE  NEGATIVE  Final       Studies/Results: Ct Abdomen Pelvis W Contrast  01/26/2011  *RADIOLOGY REPORT*  Clinical Data: Diarrhea, vomiting.  CT ABDOMEN AND PELVIS WITH CONTRAST  Technique:  Multidetector CT imaging of the abdomen and pelvis was performed following the standard protocol during bolus administration of intravenous contrast.  Contrast: OMNIPAQUE IOHEXOL 300 MG/ML IV SOLN  Comparison: 12/24/2009  Findings: Minimal left lung base scarring versus atelectasis. Heart size upper normal limits to mildly enlarged.  No pericardial or pleural effusion.  Small hiatal hernia.  Low attenuation of the liver suggests fatty infiltration. Unremarkable biliary system, spleen, pancreas, adrenal glands. Symmetric renal enhancement. Mild ureteral fullness on the right is similar to comparison.  No obstructing lesion identified.  No bowel obstruction. Small bowel anastomotic suture noted in the right lower quadrant anteriorly.    There is a segment of small bowel with mild wall thickening / mucosal hyperenhancement (image 64 series 2 for example).  Evidence of previous low anterior resection with no radiographic evidence for colitis. Evidence of an anastomotic suture in the right colon as well.  There are a couple small lymph nodes in this region which are nonspecific.  A peripherally calcified centrally fat attenuation focus within the right hemiabdomen is without interval change.  There is scattered atherosclerotic calcification of the aorta and its branches. No aneurysmal dilatation.  Thin-walled bladder.  Absent uterus.  Pelvic floor laxity.  Multilevel degenerative changes of the imaged spine. No acute or aggressive appearing osseous lesion.  IMPRESSION: Status post partial right colectomy and low anterior resection.  No bowel obstruction however, there is a short segment small bowel which is mildly inflamed appearing as can be seen with a nonspecific enteritis.  There are a couple mildly prominent lymph nodes in the right lower  quadrant which may be reactive.  Attention on follow-up.  Original Report Authenticated By: Waneta Martins, M.D.   Dg Abd Acute W/chest  01/26/2011  RADIOLOGY REPORT*  Clinical Data: Nausea, vomiting and diarrhea.  History of colon and rectal cancer.  ACUTE ABDOMINAL SERIES  Comparison: Previous examinations.  Findings: Normal sized heart.  Clear lungs.  Mildly prominent interstitial markings.  Scattered air fluid levels in normal- caliber colon and small bowel.  No free peritoneal air.  Surgical staples in the right lower abdomen and in the lower pelvis.  Right abdominal calcification without significant change since the CT dated 12/24/2009.  This had an appearance on the CT suggesting calcified fat necrosis.  Lumbar spine degenerative changes.  Mild scoliosis.  Diffuse osteopenia.  IMPRESSION:  1.  Scattered air fluid levels in normal-caliber colon and small bowel.  This can be seen with gastroenteritis.  Mild ileus is also a possibility.  Low grade partial obstruction is less likely. 2.  Mild chronic interstitial lung disease.  Original Report Authenticated By: Darrol Angel, M.D.    Scheduled Meds:   . famotidine (PEPCID) IV  20 mg Intravenous Q12H  . sodium chloride  500 mL Intravenous Once  . sodium chloride  750 mL Intravenous Once  . sodium chloride  750 mL Intravenous Once  . DISCONTD: promethazine  12.5 mg Intravenous Once   Continuous Infusions:   . sodium chloride 150 mL/hr at 01/28/11 0439  . DISCONTD: sodium chloride 100 mL/hr  at 01/28/11 0400

## 2011-01-28 NOTE — Progress Notes (Deleted)
[facility] Admission History & Physical  Chief Complaint: Diarrhea, nausea and vomiting HPI: Priscilla [name] is an 77 y.o. white female who presents with a 5-6 day history of persistent nausea vomiting and watery diarrhea with diarrhea the initial symptom. She's had mild diffuse generalized abdominal pain. He was seen in the emergency room over the weekend and released to followup with her primary gastroenterologist yesterday. He saw her in the office and felt she needed admission. She was sent here and admitted. She had a temperature of 100.5 at one point with a slightly low white blood cell count and a CT scan suggesting some possible short segment ileitis. The patient denies any recent antibiotics, any sick contacts, or any recent travel. She does get or drinking water from a well. She has a history of rectal cancer status post colostomy 11 years ago with reversal 4 years ago. She had a followup colonoscopy one year ago which was unrevealing. She denies any rectal bleeding. Past Medical History  Diagnosis Date  . Hypertension   . Chest pain, atypical   . OA (osteoarthritis)   . IDA (iron deficiency anemia)   . Heart murmur   . Deep vein thrombosis   . Osteoarthritis   . Cancer     colon, rectal    Past Surgical History  Procedure Date  . Nuclear stress test 11/17/08    NORMAL  . Replacement total knee     RIGHT KNEE  . Colonoscopy 02/2010  . Ileostomy 04/08/2007  . Colon surgery 2001 and 2008  . Appendectomy   . Colectomy   . Colostomy   . Abdominal hysterectomy   . Joint replacement 2010    knee     Medications Prior to Admission  Medication Dose Route Frequency Provider Last Rate Last Dose  . 0.9 %  sodium chloride infusion   Intravenous Continuous Arne Cleveland, MD 150 mL/hr at 01/28/11 0904    . acetaminophen (TYLENOL) tablet 650 mg  650 mg Oral Q6H PRN Arne Cleveland, MD   650 mg at 01/28/11 1610   Or  . acetaminophen (TYLENOL) suppository 650 mg  650 mg  Rectal Q6H PRN Arne Cleveland, MD      . aspirin chewable tablet 81 mg  81 mg Oral Daily Pandora Leiter      . atenolol (TENORMIN) tablet 50 mg  50 mg Oral BID Pandora Leiter      . famotidine (PEPCID) IVPB 20 mg  20 mg Intravenous Q12H Arne Cleveland, MD      . heparin injection 5,000 Units  5,000 Units Subcutaneous Q8H Pandora Leiter      . HYDROmorphone (DILAUDID) injection 0.5 mg  0.5 mg Intravenous Q4H PRN Arne Cleveland, MD   0.5 mg at 01/28/11 0338  . iohexol (OMNIPAQUE) 300 MG/ML injection 100 mL  100 mL Intravenous Once PRN Medication Radiologist   100 mL at 01/26/11 0404  . nitroGLYCERIN (NITROSTAT) SL tablet 0.4 mg  0.4 mg Sublingual Q5 min PRN Arne Cleveland, MD      . ondansetron [facility]) injection 4 mg  4 mg Intravenous Q6H PRN Arne Cleveland, MD      . promethazine (PHENERGAN) injection 12.5 mg  12.5 mg Intravenous Q6H PRN Arne Cleveland, MD      . sodium chloride 0.9 % bolus 500 mL  500 mL Intravenous Once Arne Cleveland, MD   500 mL at 01/28/11 0101  . sodium chloride 0.9 % bolus 750 mL  750 mL Intravenous  Once Ward Givens, MD   1,000 mL at 01/27/11 2152  . sodium chloride 0.9 % bolus 750 mL  750 mL Intravenous Once Ward Givens, MD   1,000 mL at 01/28/11 0011  . DISCONTD: 0.9 %  sodium chloride infusion   Intravenous Continuous Ward Givens, MD 100 mL/hr at 01/28/11 0400    . DISCONTD: loperamide (IMODIUM) capsule 4 mg  4 mg Oral PRN Ward Givens, MD   4 mg at 01/27/11 2144  . DISCONTD: ondansetron (ZOFRAN) injection 4 mg  4 mg Intravenous Q1H PRN Ward Givens, MD   4 mg at 01/27/11 2142  . DISCONTD: promethazine (PHENERGAN) injection 12.5 mg  12.5 mg Intravenous Once Ward Givens, MD   12.5 mg at 01/28/11 0049   Medications Prior to Admission  Medication Sig Dispense Refill  . aspirin 81 MG tablet Take 81 mg by mouth daily.        Marland Kitchen atenolol (TENORMIN) 50 MG tablet Take 1 tablet (50 mg total) by mouth 2 (two) times daily.  180 tablet  3  . Diphenhydramine-APAP, sleep, (TYLENOL  PM EXTRA STRENGTH PO) Take 1-2 tablets by mouth at bedtime. For sleep       . hydrochlorothiazide (,MICROZIDE/HYDRODIURIL,) 12.5 MG capsule Take 1 capsule (12.5 mg total) by mouth daily.  90 capsule  3  . losartan (COZAAR) 100 MG tablet Take 1 tablet (100 mg total) by mouth daily.  90 tablet  3  . nitroGLYCERIN (NITROSTAT) 0.4 MG SL tablet Place 0.4 mg under the tongue every 5 (five) minutes as needed. For chest pain.        Allergies:  Allergies  Allergen Reactions  . Morphine And Related Nausea And Vomiting  . Lisinopril Cough    Family History  Problem Relation Age of Onset  . Heart disease Mother   . Heart disease Father   . Lupus Father     Social History:  reports that she has never smoked. She does not have any smokeless tobacco history on file. She reports that she does not drink alcohol or use illicit drugs.  Negative except as mentioned above  Blood pressure 142/54, pulse 93, temperature 99.4 F (37.4 C), temperature source Oral, resp. rate 15, height 5\' 2"  (1.575 m), weight 70.8 kg (156 lb 1.4 oz), SpO2 94.00%. Head: Normocephalic, without obvious abnormality, atraumatic Neck: no adenopathy, no carotid bruit, no JVD, supple, symmetrical, trachea midline and thyroid not enlarged, symmetric, no tenderness/mass/nodules Resp: clear to auscultation bilaterally Cardio: regular rate and rhythm, S1, S2 normal, no murmur, click, rub or gallop GI: Abdomen soft slightly distended with normoactive bowel sounds no hepatosplenomegaly mass or guarding. There is mild diffuse tenderness. Extremities: extremities normal, atraumatic, no cyanosis or edema  Results for orders placed during the hospital encounter of 01/27/11 (from the past 48 hour(s))  CBC     Status: Abnormal   Collection Time   01/27/11  9:31 PM      Component Value Range Comment   WBC 4.5  4.0 - 10.5 (K/uL)    RBC 3.94  3.87 - 5.11 (MIL/uL)    Hemoglobin 11.8 (*) 12.0 - 15.0 (g/dL)    HCT 16.1 (*) 09.6 - 46.0 (%)     MCV 86.0  78.0 - 100.0 (fL)    MCH 29.9  26.0 - 34.0 (pg)    MCHC 34.8  30.0 - 36.0 (g/dL)    RDW 04.5  40.9 - 81.1 (%)    Platelets 260  150 - 400 (K/uL)   DIFFERENTIAL     Status: Abnormal   Collection Time   01/27/11  9:31 PM      Component Value Range Comment   Neutrophils Relative 64  43 - 77 (%)    Lymphocytes Relative 14  12 - 46 (%)    Monocytes Relative 22 (*) 3 - 12 (%)    Eosinophils Relative 0  0 - 5 (%)    Basophils Relative 0  0 - 1 (%)    Neutro Abs 2.9  1.7 - 7.7 (K/uL)    Lymphs Abs 0.6 (*) 0.7 - 4.0 (K/uL)    Monocytes Absolute 1.0  0.1 - 1.0 (K/uL)    Eosinophils Absolute 0.0  0.0 - 0.7 (K/uL)    Basophils Absolute 0.0  0.0 - 0.1 (K/uL)    RBC Morphology POLYCHROMASIA PRESENT      WBC Morphology MILD LEFT SHIFT (1-5% METAS, OCC MYELO, OCC BANDS)     COMPREHENSIVE METABOLIC PANEL     Status: Abnormal   Collection Time   01/27/11  9:31 PM      Component Value Range Comment   Sodium 127 (*) 135 - 145 (mEq/L)    Potassium 3.6  3.5 - 5.1 (mEq/L)    Chloride 89 (*) 96 - 112 (mEq/L)    CO2 18 (*) 19 - 32 (mEq/L)    Glucose, Bld 126 (*) 70 - 99 (mg/dL)    BUN 57 (*) 6 - 23 (mg/dL)    Creatinine, Ser 1.61 (*) 0.50 - 1.10 (mg/dL)    Calcium 9.1  8.4 - 10.5 (mg/dL)    Total Protein 7.4  6.0 - 8.3 (g/dL)    Albumin 3.2 (*) 3.5 - 5.2 (g/dL)    AST 22  0 - 37 (U/L)    ALT 13  0 - 35 (U/L)    Alkaline Phosphatase 101  39 - 117 (U/L)    Total Bilirubin 0.8  0.3 - 1.2 (mg/dL)    GFR calc non Af Amer 12 (*) >90 (mL/min)    GFR calc Af Amer 14 (*) >90 (mL/min)   LACTIC ACID, PLASMA     Status: Normal   Collection Time   01/27/11 11:32 PM      Component Value Range Comment   Lactic Acid, Venous 1.4  0.5 - 2.2 (mmol/L)   MRSA PCR SCREENING     Status: Normal   Collection Time   01/28/11  2:36 AM      Component Value Range Comment   MRSA by PCR NEGATIVE  NEGATIVE    COMPREHENSIVE METABOLIC PANEL     Status: Abnormal   Collection Time   01/28/11  5:40 AM      Component  Value Range Comment   Sodium 132 (*) 135 - 145 (mEq/L)    Potassium 3.5  3.5 - 5.1 (mEq/L)    Chloride 99  96 - 112 (mEq/L)    CO2 18 (*) 19 - 32 (mEq/L)    Glucose, Bld 117 (*) 70 - 99 (mg/dL)    BUN 57 (*) 6 - 23 (mg/dL)    Creatinine, Ser 0.96 (*) 0.50 - 1.10 (mg/dL)    Calcium 8.0 (*) 8.4 - 10.5 (mg/dL)    Total Protein 6.0  6.0 - 8.3 (g/dL)    Albumin 2.5 (*) 3.5 - 5.2 (g/dL)    AST 17  0 - 37 (U/L)    ALT 11  0 - 35 (U/L)    Alkaline Phosphatase  82  39 - 117 (U/L)    Total Bilirubin 0.4  0.3 - 1.2 (mg/dL)    GFR calc non Af Amer 18 (*) >90 (mL/min)    GFR calc Af Amer 21 (*) >90 (mL/min)   CBC     Status: Abnormal   Collection Time   01/28/11  5:40 AM      Component Value Range Comment   WBC 2.1 (*) 4.0 - 10.5 (K/uL)    RBC 3.36 (*) 3.87 - 5.11 (MIL/uL)    Hemoglobin 10.2 (*) 12.0 - 15.0 (g/dL)    HCT 96.0 (*) 45.4 - 46.0 (%)    MCV 86.9  78.0 - 100.0 (fL)    MCH 30.4  26.0 - 34.0 (pg)    MCHC 34.9  30.0 - 36.0 (g/dL)    RDW 09.8  11.9 - 14.7 (%)    Platelets 190  150 - 400 (K/uL) DELTA CHECK NOTED   No results found.  Assessment: Diarrhea nausea and vomiting with risk factor of well water raising the issue of giardiasis or other infectious enteritis Plan: Await pending stool studies and will add cryptosporidia and Giardia studies. I would favor empiric treatment with Cipro and Flagyl based on these suggestion of ileitis on CT scan abdomen duration of her symptoms pending stool studies or definite clinical improvement.  Bentleigh Stankus C 01/28/2011, 9:21 AM

## 2011-01-28 NOTE — H&P (Signed)
Priscilla [name] is an 77 y.o. female.   Chief Complaint:  diarrhea HPI:  77 y.o. Cauc female who was seen in ER on Saturday for Nausea, Vomiting, & diarrhea. Was given Iv fluids and release to f/u with Gastroenterologist Dr Evette Cristal. She went to see him today not doing any better and he referred her back to ER. She state that the diarrhea is uncontrollable  going several times an hour . She several episodes of emesis through out the day. She c/o mild cramping abdominal pain. She denies fever or chills. She c/o muscle achesin her leg and back.  Past Medical History  Diagnosis Date  . Hypertension   . Chest pain, atypical   . OA (osteoarthritis)   . IDA (iron deficiency anemia)   . Cancer     colon  . Heart murmur   . Deep vein thrombosis   . Osteoarthritis     Past Surgical History  Procedure Date  . Nuclear stress test 11/17/08    NORMAL  . Replacement total knee     RIGHT KNEE  . Colonoscopy 02/2010  . Ileostomy 04/08/2007  . Colon surgery 2001 and 2008  . Joint replacement 2010    knee   . Appendectomy   . Colectomy   . Colostomy   . Abdominal hysterectomy     Family History  Problem Relation Age of Onset  . Heart disease Mother   . Heart disease Father   . Lupus Father    Social History:  reports that she has never smoked. She does not have any smokeless tobacco history on file. She reports that she does not drink alcohol or use illicit drugs.  Allergies:  Allergies  Allergen Reactions  . Morphine And Related Nausea And Vomiting  . Lisinopril Cough    Medications Prior to Admission  Medication Dose Route Frequency Provider Last Rate Last Dose  . 0.9 %  sodium chloride infusion   Intravenous Continuous Ward Givens, MD 100 mL/hr at 01/28/11 0011    . iohexol (OMNIPAQUE) 300 MG/ML injection 100 mL  100 mL Intravenous Once PRN Medication Radiologist   100 mL at 01/26/11 0404  . loperamide (IMODIUM) capsule 4 mg  4 mg Oral PRN Ward Givens, MD   4 mg at 01/27/11 2144  .  ondansetron (ZOFRAN) injection 4 mg  4 mg Intravenous Q1H PRN Ward Givens, MD   4 mg at 01/27/11 2142  . promethazine (PHENERGAN) injection 12.5 mg  12.5 mg Intravenous Once Ward Givens, MD      . sodium chloride 0.9 % bolus 500 mL  500 mL Intravenous Once Arne Cleveland, MD      . sodium chloride 0.9 % bolus 750 mL  750 mL Intravenous Once Ward Givens, MD   1,000 mL at 01/27/11 2152  . sodium chloride 0.9 % bolus 750 mL  750 mL Intravenous Once Ward Givens, MD   1,000 mL at 01/28/11 0011   Medications Prior to Admission  Medication Sig Dispense Refill  . aspirin 81 MG tablet Take 81 mg by mouth daily.        Marland Kitchen atenolol (TENORMIN) 50 MG tablet Take 1 tablet (50 mg total) by mouth 2 (two) times daily.  180 tablet  3  . Diphenhydramine-APAP, sleep, (TYLENOL PM EXTRA STRENGTH PO) Take 1-2 tablets by mouth at bedtime. For sleep       . hydrochlorothiazide (,MICROZIDE/HYDRODIURIL,) 12.5 MG capsule Take 1 capsule (12.5 mg total) by  mouth daily.  90 capsule  3  . losartan (COZAAR) 100 MG tablet Take 1 tablet (100 mg total) by mouth daily.  90 tablet  3  . nitroGLYCERIN (NITROSTAT) 0.4 MG SL tablet Place 0.4 mg under the tongue every 5 (five) minutes as needed. For chest pain.        Results for orders placed during the hospital encounter of 01/27/11 (from the past 48 hour(s))  CBC     Status: Abnormal   Collection Time   01/27/11  9:31 PM      Component Value Range Comment   WBC 4.5  4.0 - 10.5 (K/uL)    RBC 3.94  3.87 - 5.11 (MIL/uL)    Hemoglobin 11.8 (*) 12.0 - 15.0 (g/dL)    HCT 09.8 (*) 11.9 - 46.0 (%)    MCV 86.0  78.0 - 100.0 (fL)    MCH 29.9  26.0 - 34.0 (pg)    MCHC 34.8  30.0 - 36.0 (g/dL)    RDW 14.7  82.9 - 56.2 (%)    Platelets 260  150 - 400 (K/uL)   DIFFERENTIAL     Status: Abnormal   Collection Time   01/27/11  9:31 PM      Component Value Range Comment   Neutrophils Relative 64  43 - 77 (%)    Lymphocytes Relative 14  12 - 46 (%)    Monocytes Relative 22 (*) 3 - 12 (%)     Eosinophils Relative 0  0 - 5 (%)    Basophils Relative 0  0 - 1 (%)    Neutro Abs 2.9  1.7 - 7.7 (K/uL)    Lymphs Abs 0.6 (*) 0.7 - 4.0 (K/uL)    Monocytes Absolute 1.0  0.1 - 1.0 (K/uL)    Eosinophils Absolute 0.0  0.0 - 0.7 (K/uL)    Basophils Absolute 0.0  0.0 - 0.1 (K/uL)    RBC Morphology POLYCHROMASIA PRESENT      WBC Morphology MILD LEFT SHIFT (1-5% METAS, OCC MYELO, OCC BANDS)     COMPREHENSIVE METABOLIC PANEL     Status: Abnormal   Collection Time   01/27/11  9:31 PM      Component Value Range Comment   Sodium 127 (*) 135 - 145 (mEq/L)    Potassium 3.6  3.5 - 5.1 (mEq/L)    Chloride 89 (*) 96 - 112 (mEq/L)    CO2 18 (*) 19 - 32 (mEq/L)    Glucose, Bld 126 (*) 70 - 99 (mg/dL)    BUN 57 (*) 6 - 23 (mg/dL)    Creatinine, Ser 1.30 (*) 0.50 - 1.10 (mg/dL)    Calcium 9.1  8.4 - 10.5 (mg/dL)    Total Protein 7.4  6.0 - 8.3 (g/dL)    Albumin 3.2 (*) 3.5 - 5.2 (g/dL)    AST 22  0 - 37 (U/L)    ALT 13  0 - 35 (U/L)    Alkaline Phosphatase 101  39 - 117 (U/L)    Total Bilirubin 0.8  0.3 - 1.2 (mg/dL)    GFR calc non Af Amer 12 (*) >90 (mL/min)    GFR calc Af Amer 14 (*) >90 (mL/min)   LACTIC ACID, PLASMA     Status: Normal   Collection Time   01/27/11 11:32 PM      Component Value Range Comment   Lactic Acid, Venous 1.4  0.5 - 2.2 (mmol/L)    Ct Abdomen Pelvis W Contrast  01/26/2011  *  RADIOLOGY REPORT*  Clinical Data: Diarrhea, vomiting.  CT ABDOMEN AND PELVIS WITH CONTRAST  Technique:  Multidetector CT imaging of the abdomen and pelvis was performed following the standard protocol during bolus administration of intravenous contrast.  Contrast: OMNIPAQUE IOHEXOL 300 MG/ML IV SOLN  Comparison: 12/24/2009  Findings: Minimal left lung base scarring versus atelectasis. Heart size upper normal limits to mildly enlarged.  No pericardial or pleural effusion.  Small hiatal hernia.  Low attenuation of the liver suggests fatty infiltration. Unremarkable biliary system, spleen, pancreas,  adrenal glands. Symmetric renal enhancement. Mild ureteral fullness on the right is similar to comparison.  No obstructing lesion identified.  No bowel obstruction. Small bowel anastomotic suture noted in the right lower quadrant anteriorly.    There is a segment of small bowel with mild wall thickening / mucosal hyperenhancement (image 64 series 2 for example).  Evidence of previous low anterior resection with no radiographic evidence for colitis. Evidence of an anastomotic suture in the right colon as well.  There are a couple small lymph nodes in this region which are nonspecific.  A peripherally calcified centrally fat attenuation focus within the right hemiabdomen is without interval change.  There is scattered atherosclerotic calcification of the aorta and its branches. No aneurysmal dilatation.  Thin-walled bladder.  Absent uterus.  Pelvic floor laxity.  Multilevel degenerative changes of the imaged spine. No acute or aggressive appearing osseous lesion.  IMPRESSION: Status post partial right colectomy and low anterior resection.  No bowel obstruction however, there is a short segment small bowel which is mildly inflamed appearing as can be seen with a nonspecific enteritis.  There are a couple mildly prominent lymph nodes in the right lower quadrant which may be reactive.  Attention on follow-up.  Original Report Authenticated By: Waneta Martins, M.D.   Dg Abd Acute W/chest  01/26/2011  RADIOLOGY REPORT*  Clinical Data: Nausea, vomiting and diarrhea.  History of colon and rectal cancer.  ACUTE ABDOMINAL SERIES  Comparison: Previous examinations.  Findings: Normal sized heart.  Clear lungs.  Mildly prominent interstitial markings.  Scattered air fluid levels in normal- caliber colon and small bowel.  No free peritoneal air.  Surgical staples in the right lower abdomen and in the lower pelvis.  Right abdominal calcification without significant change since the CT dated 12/24/2009.  This had an  appearance on the CT suggesting calcified fat necrosis.  Lumbar spine degenerative changes.  Mild scoliosis.  Diffuse osteopenia.  IMPRESSION:  1.  Scattered air fluid levels in normal-caliber colon and small bowel.  This can be seen with gastroenteritis.  Mild ileus is also a possibility.  Low grade partial obstruction is less likely. 2.  Mild chronic interstitial lung disease.  Original Report Authenticated By: Darrol Angel, M.D.    Review of Systems  Constitutional: Positive for malaise/fatigue and diaphoresis. Negative for fever and chills.  HENT: Negative.  Negative for neck pain.   Eyes: Negative.   Respiratory: Negative.   Cardiovascular: Negative.   Gastrointestinal: Positive for nausea, vomiting and diarrhea.  Genitourinary: Negative.   Musculoskeletal: Positive for myalgias. Negative for back pain, joint pain and falls.  Skin: Negative.   Neurological: Positive for weakness. Negative for dizziness and tingling.  Endo/Heme/Allergies: Negative.   Psychiatric/Behavioral: Negative for depression. The patient is not nervous/anxious.     Blood pressure 94/31, pulse 77, temperature 98.8 F (37.1 C), temperature source Oral, resp. rate 29, SpO2 98.00%. Physical Exam  Constitutional: She is oriented to person, place, and  time. She appears well-developed and well-nourished. She appears distressed.  HENT:  Head: Normocephalic and atraumatic.  Right Ear: External ear normal.  Left Ear: External ear normal.  Nose: Nose normal.  Mouth/Throat: Oropharynx is clear and moist.  Eyes: Conjunctivae and EOM are normal. Pupils are equal, round, and reactive to light.  Neck: Normal range of motion. Neck supple. No JVD present. No tracheal deviation present. No thyromegaly present.  Cardiovascular: Normal rate and regular rhythm.  Exam reveals no gallop and no friction rub.   Murmur heard. Respiratory: No respiratory distress. She has no wheezes. She has no rales.  GI: Soft. She exhibits no  distension and no mass. There is no tenderness. There is no rebound and no guarding.  Musculoskeletal: Normal range of motion. She exhibits no edema and no tenderness.  Neurological: She is alert and oriented to person, place, and time. She displays normal reflexes. No cranial nerve deficit. She exhibits normal muscle tone. Coordination normal.  Skin: Skin is warm and dry. No rash noted. She is not diaphoretic. No erythema. No pallor.  Psychiatric: She has a normal mood and affect. Her behavior is normal. Judgment and thought content normal.     Assessment/Plan:  Diarrhea Dehydration Acute renal failure secondary to Dehydration & Hypotension Nausea &vomiting Enteritis Hypomnatremia   Plan:  Admit to step down IV fluids Bowel rest  Stool cultures,O&P & C diffficille  IV Zofran  Am labs  Close monitoring   Saleen Peden 01/28/2011, 12:31 AM

## 2011-01-29 LAB — CBC
Hemoglobin: 9.2 g/dL — ABNORMAL LOW (ref 12.0–15.0)
MCH: 30.1 pg (ref 26.0–34.0)
RBC: 3.06 MIL/uL — ABNORMAL LOW (ref 3.87–5.11)
WBC: 3.2 10*3/uL — ABNORMAL LOW (ref 4.0–10.5)

## 2011-01-29 LAB — BASIC METABOLIC PANEL
CO2: 15 mEq/L — ABNORMAL LOW (ref 19–32)
GFR calc non Af Amer: 39 mL/min — ABNORMAL LOW (ref >90)
Glucose, Bld: 100 mg/dL — ABNORMAL HIGH (ref 70–99)
Potassium: 2.8 mEq/L — ABNORMAL LOW (ref 3.5–5.1)
Sodium: 138 mEq/L (ref 135–145)

## 2011-01-29 MED ORDER — PROMETHAZINE HCL 25 MG/ML IJ SOLN
12.5000 mg | Freq: Once | INTRAMUSCULAR | Status: AC
  Administered 2011-01-29: 12.5 mg via INTRAVENOUS

## 2011-01-29 MED ORDER — POTASSIUM CHLORIDE 10 MEQ/100ML IV SOLN
10.0000 meq | INTRAVENOUS | Status: AC
  Administered 2011-01-29 (×2): 10 meq via INTRAVENOUS

## 2011-01-29 MED ORDER — POTASSIUM CHLORIDE IN NACL 20-0.9 MEQ/L-% IV SOLN
INTRAVENOUS | Status: DC
  Administered 2011-01-29: 15:00:00 via INTRAVENOUS
  Administered 2011-01-31: 75 mL/h via INTRAVENOUS
  Administered 2011-02-01: 11:00:00 via INTRAVENOUS
  Filled 2011-01-29 (×9): qty 1000

## 2011-01-29 NOTE — Progress Notes (Signed)
*  PRELIMINARY RESULTS*  Bilateral lower venous doppler performed. No obvious evidence of DVT, superficial thrombus or bakers cysts.  Vinetta Bergamo 01/29/2011, 2:31 PM

## 2011-01-29 NOTE — Progress Notes (Signed)
[facility] Progress Note  Subjective: The patient states that her diarrhea is improved but her vomiting is persistent. She is still rotten temperatures as high as 100.5 today.  Objective: Vital signs in last 24 hours: Temp:  [98.1 F (36.7 C)-100.5 F (38.1 C)] 98.1 F (36.7 C) (11/07 0841) Pulse Rate:  [79-84] 84  (11/07 0000) Resp:  [15-31] 15  (11/07 1245) BP: (118-133)/(42-52) 127/48 mmHg (11/07 1245) SpO2:  [93 %-100 %] 100 % (11/07 1245) Weight:  [71.4 kg (157 lb 6.5 oz)] 157 lb 6.5 oz (71.4 kg) (11/07 0300) Weight change: 1.7 kg (3 lb 12 oz)   PE: Abdomen soft nondistended with normoactive bowel sounds.  Lab Results: Results for orders placed during the hospital encounter of 01/27/11 (from the past 24 hour(s))  BASIC METABOLIC PANEL     Status: Abnormal   Collection Time   01/29/11  6:40 AM      Component Value Range   Sodium 138  135 - 145 (mEq/L)   Potassium 2.8 (*) 3.5 - 5.1 (mEq/L)   Chloride 109  96 - 112 (mEq/L)   CO2 15 (*) 19 - 32 (mEq/L)   Glucose, Bld 100 (*) 70 - 99 (mg/dL)   BUN 43 (*) 6 - 23 (mg/dL)   Creatinine, Ser 1.61 (*) 0.50 - 1.10 (mg/dL)   Calcium 8.0 (*) 8.4 - 10.5 (mg/dL)   GFR calc non Af Amer 39 (*) >90 (mL/min)   GFR calc Af Amer 45 (*) >90 (mL/min)  CBC     Status: Abnormal   Collection Time   01/29/11  6:40 AM      Component Value Range   WBC 3.2 (*) 4.0 - 10.5 (K/uL)   RBC 3.06 (*) 3.87 - 5.11 (MIL/uL)   Hemoglobin 9.2 (*) 12.0 - 15.0 (g/dL)   HCT 09.6 (*) 04.5 - 46.0 (%)   MCV 86.6  78.0 - 100.0 (fL)   MCH 30.1  26.0 - 34.0 (pg)   MCHC 34.7  30.0 - 36.0 (g/dL)   RDW 40.9  81.1 - 91.4 (%)   Platelets 181  150 - 400 (K/uL)    Studies/Results: Stool for C. difficile pending, cryptosporidia and Giardia antigen are pending  Assessment: Overall picture most consistent with infectious gastroenteritis of duration longer than would be expected by typical virus.  Plan: Will continue Cipro and Flagyl empirically while  awake and remaining stool studies. Consider advancing diet but will leave at clear liquids for now. May need to consider sigmoidoscopy or even colonoscopy if no clear diagnosis and no improvement on empiric therapy.    Neoma Uhrich C 01/29/2011, 1:37 PM

## 2011-01-29 NOTE — Progress Notes (Signed)
Subjective: Patient seen, still having nausea, vomiting. Says, she has moved her bowels 3-4 times since this morning. Objective: Filed Vitals:   01/29/11 0841 01/29/11 0845 01/29/11 1200 01/29/11 1245  BP:  119/51  127/48  Pulse:      Temp: 98.1 F (36.7 C)     TempSrc: Oral     Resp:  19 17 15   Height:      Weight:      SpO2: 95%   100%   Weight change: 1.7 kg (3 lb 12 oz)  Intake/Output Summary (Last 24 hours) at 01/29/11 1552 Last data filed at 01/29/11 1400  Gross per 24 hour  Intake 3307.5 ml  Output   1840 ml  Net 1467.5 ml    General: Alert, awake, oriented x3, in no acute distress.  HEENT: No bruits, no goiter.  Heart: Regular rate and rhythm, without murmurs, rubs, gallops.  Lungs: Crackles bilaterally  Abdomen: Soft, nontender, nondistended, positive bowel sounds.  Neuro: Grossly intact, nonfocal. EXT:  Positive left lower ext tenderness in calf   Lab Results:  [facility] 01/29/11 0640 01/28/11 0540  NA 138 132*  K 2.8* 3.5  CL 109 99  CO2 15* 18*  GLUCOSE 100* 117*  BUN 43* 57*  CREATININE 1.28* 2.43*  CALCIUM 8.0* 8.0*  MG -- --  PHOS -- --    Basename 01/28/11 0540 01/27/11 2131  AST 17 22  ALT 11 13  ALKPHOS 82 101  BILITOT 0.4 0.8  PROT 6.0 7.4  ALBUMIN 2.5* 3.2*   No results found for this basename: LIPASE:2,AMYLASE:2 in the last 72 hours  Basename 01/29/11 0640 01/28/11 0540 01/27/11 2131  WBC 3.2* 2.1* --  NEUTROABS -- -- 2.9  HGB 9.2* 10.2* --  HCT 26.5* 29.2* --  MCV 86.6 86.9 --  PLT 181 190 --    Micro Results: Recent Results (from the past 240 hour(s))  STOOL CULTURE     Status: Normal (Preliminary result)   Collection Time   01/28/11 12:15 AM      Component Value Range Status Comment   Specimen Description STOOL   Final    Special Requests Normal   Final    Culture Culture reincubated for better growth   Final    Report Status PENDING   Incomplete   CLOSTRIDIUM DIFFICILE BY PCR     Status: Normal   Collection Time   01/28/11 12:15 AM      Component Value Range Status Comment   C difficile by pcr NEGATIVE  NEGATIVE  Final   MRSA PCR SCREENING     Status: Normal   Collection Time   01/28/11  2:36 AM      Component Value Range Status Comment   MRSA by PCR NEGATIVE  NEGATIVE  Final      Patient Active Hospital Problem List: Enteritis (01/28/2011)   Assessment: Improving   Plan: Continue with Cipro and Flagyl. Acute renal failure (01/28/2011)   Assessment: Improving   Plan: Will continue IV fluids, will cut down the rate to 75 ml/hr. Dehydration (01/28/2011)   Assessment: Resolved.   Plan: Continue with iv fluids. Diarrhea (01/28/2011)   Assessment: Improving   Plan: Stool studies pending. Hypotension (01/28/2011)   Assessment: resolved.   Hypokalemia:    Assesment: secondary to diarrhea. Replace potassium. Leg Tenderness: Will get Doppler ultrasound of lower extremities.   LOS: 2 days   Justeen Hehr S 01/29/2011, 3:52 PM

## 2011-01-30 DIAGNOSIS — E876 Hypokalemia: Secondary | ICD-10-CM

## 2011-01-30 LAB — BASIC METABOLIC PANEL
CO2: 16 mEq/L — ABNORMAL LOW (ref 19–32)
Calcium: 8.1 mg/dL — ABNORMAL LOW (ref 8.4–10.5)
Chloride: 112 mEq/L (ref 96–112)
Glucose, Bld: 105 mg/dL — ABNORMAL HIGH (ref 70–99)
Sodium: 140 mEq/L (ref 135–145)

## 2011-01-30 LAB — CBC
HCT: 27.2 % — ABNORMAL LOW (ref 36.0–46.0)
Hemoglobin: 9.4 g/dL — ABNORMAL LOW (ref 12.0–15.0)
MCH: 29.8 pg (ref 26.0–34.0)
MCV: 86.3 fL (ref 78.0–100.0)
Platelets: 218 10*3/uL (ref 150–400)
RBC: 3.15 MIL/uL — ABNORMAL LOW (ref 3.87–5.11)
WBC: 9.7 10*3/uL (ref 4.0–10.5)

## 2011-01-30 LAB — GLUCOSE, CAPILLARY

## 2011-01-30 MED ORDER — POTASSIUM CHLORIDE 10 MEQ/100ML IV SOLN
10.0000 meq | INTRAVENOUS | Status: AC
  Administered 2011-01-30: 10 meq via INTRAVENOUS

## 2011-01-30 NOTE — Progress Notes (Signed)
Subjective: Patient seen, still having nausea, vomiting, stool studies are pending at this time. Objective: Filed Vitals:   01/30/11 0400 01/30/11 0732 01/30/11 0800 01/30/11 1208  BP: 148/59  124/62   Pulse:      Temp: 99 F (37.2 C) 97.6 F (36.4 C)  98.5 F (36.9 C)  TempSrc: Oral Oral  Oral  Resp: 22     Height:      Weight:      SpO2: 96% 96% 98% 97%   Weight change:   Intake/Output Summary (Last 24 hours) at 01/30/11 1217 Last data filed at 01/30/11 1000  Gross per 24 hour  Intake   1940 ml  Output   1350 ml  Net    590 ml    General: Alert, awake, oriented x3, in no acute distress.  HEENT: No bruits, no goiter.  Heart: Regular rate and rhythm, without murmurs, rubs, gallops.  Lungs: Crackles bilaterally  Abdomen: Soft, Mild diffuse tenderness, nondistended, positive bowel sounds.  Neuro: Grossly intact, nonfocal. EXT:  Positive left lower ext tenderness in calf   Lab Results:  [facility] 01/30/11 0605 01/29/11 0640  NA 140 138  K 2.9* 2.8*  CL 112 109  CO2 16* 15*  GLUCOSE 105* 100*  BUN 36* 43*  CREATININE 1.05 1.28*  CALCIUM 8.1* 8.0*  MG 1.5 --  PHOS -- --    Basename 01/28/11 0540 01/27/11 2131  AST 17 22  ALT 11 13  ALKPHOS 82 101  BILITOT 0.4 0.8  PROT 6.0 7.4  ALBUMIN 2.5* 3.2*   No results found for this basename: LIPASE:2,AMYLASE:2 in the last 72 hours  Basename 01/30/11 0605 01/29/11 0640 01/27/11 2131  WBC 9.7 3.2* --  NEUTROABS -- -- 2.9  HGB 9.4* 9.2* --  HCT 27.2* 26.5* --  MCV 86.3 86.6 --  PLT 218 181 --    Micro Results: Recent Results (from the past 240 hour(s))  STOOL CULTURE     Status: Normal (Preliminary result)   Collection Time   01/28/11 12:15 AM      Component Value Range Status Comment   Specimen Description STOOL   Final    Special Requests Normal   Final    Culture Culture reincubated for better growth   Final    Report Status PENDING   Incomplete   CLOSTRIDIUM DIFFICILE BY PCR     Status: Normal   Collection Time   01/28/11 12:15 AM      Component Value Range Status Comment   C difficile by pcr NEGATIVE  NEGATIVE  Final   MRSA PCR SCREENING     Status: Normal   Collection Time   01/28/11  2:36 AM      Component Value Range Status Comment   MRSA by PCR NEGATIVE  NEGATIVE  Final      Patient Active Hospital Problem List: Enteritis (01/28/2011)   Assessment: Improving   Plan: Continue with Cipro and Flagyl. Dr Madilyn Fireman to do Flex sig and EGD. Acute renal failure (01/28/2011)   Assessment: Improving   Plan: Will continue the IV fluids at 75 ml/hr Dehydration (01/28/2011)   Assessment: Resolved.   Plan: Continue with iv fluids. Diarrhea (01/28/2011)   Assessment: Intermittent, flex Sig as per Dr Madilyn Fireman.   Plan: Stool studies pending. Hypotension (01/28/2011)   Assessment: resolved.   Hypokalemia:    Assesment: secondary to diarrhea. Replace potassium. Leg Tenderness: Dopplers of lower extremities done, results are pending at this time.  Will transfer the patient out  of ICU..   LOS: 3 days   Davell Beckstead S 01/30/2011, 12:17 PM

## 2011-01-30 NOTE — Progress Notes (Signed)
[facility] Progress Note  Subjective: Still complaining of nausea vomiting and diarrhea with some blood noted in her emesis. She is becoming increasingly frustrated with her ongoing symptoms. Objective: Vital signs in last 24 hours: Temp:  [97.6 F (36.4 C)-99 F (37.2 C)] 97.6 F (36.4 C) (11/08 0732) Resp:  [15-25] 22  (11/08 0400) BP: (121-148)/(44-62) 124/62 mmHg (11/08 0800) SpO2:  [95 %-100 %] 98 % (11/08 0800) Weight change:    PE: Abdomen soft  Lab Results: Results for orders placed during the hospital encounter of 01/27/11 (from the past 24 hour(s))  BASIC METABOLIC PANEL     Status: Abnormal   Collection Time   01/30/11  6:05 AM      Component Value Range   Sodium 140  135 - 145 (mEq/L)   Potassium 2.9 (*) 3.5 - 5.1 (mEq/L)   Chloride 112  96 - 112 (mEq/L)   CO2 16 (*) 19 - 32 (mEq/L)   Glucose, Bld 105 (*) 70 - 99 (mg/dL)   BUN 36 (*) 6 - 23 (mg/dL)   Creatinine, Ser 1.61  0.50 - 1.10 (mg/dL)   Calcium 8.1 (*) 8.4 - 10.5 (mg/dL)   GFR calc non Af Amer 50 (*) >90 (mL/min)   GFR calc Af Amer 57 (*) >90 (mL/min)  CBC     Status: Abnormal   Collection Time   01/30/11  6:05 AM      Component Value Range   WBC 9.7  4.0 - 10.5 (K/uL)   RBC 3.15 (*) 3.87 - 5.11 (MIL/uL)   Hemoglobin 9.4 (*) 12.0 - 15.0 (g/dL)   HCT 09.6 (*) 04.5 - 46.0 (%)   MCV 86.3  78.0 - 100.0 (fL)   MCH 29.8  26.0 - 34.0 (pg)   MCHC 34.6  30.0 - 36.0 (g/dL)   RDW 40.9  81.1 - 91.4 (%)   Platelets 218  150 - 400 (K/uL)  MAGNESIUM     Status: Normal   Collection Time   01/30/11  6:05 AM      Component Value Range   Magnesium 1.5  1.5 - 2.5 (mg/dL)    Studies/Results: Ct Abdomen Pelvis W Contrast  01/26/2011  *RADIOLOGY REPORT*  Clinical Data: Diarrhea, vomiting.  CT ABDOMEN AND PELVIS WITH CONTRAST  Technique:  Multidetector CT imaging of the abdomen and pelvis was performed following the standard protocol during bolus administration of intravenous contrast.  Contrast:  OMNIPAQUE IOHEXOL 300 MG/ML IV SOLN  Comparison: 12/24/2009  Findings: Minimal left lung base scarring versus atelectasis. Heart size upper normal limits to mildly enlarged.  No pericardial or pleural effusion.  Small hiatal hernia.  Low attenuation of the liver suggests fatty infiltration. Unremarkable biliary system, spleen, pancreas, adrenal glands. Symmetric renal enhancement. Mild ureteral fullness on the right is similar to comparison.  No obstructing lesion identified.  No bowel obstruction. Small bowel anastomotic suture noted in the right lower quadrant anteriorly.    There is a segment of small bowel with mild wall thickening / mucosal hyperenhancement (image 64 series 2 for example).  Evidence of previous low anterior resection with no radiographic evidence for colitis. Evidence of an anastomotic suture in the right colon as well.  There are a couple small lymph nodes in this region which are nonspecific.  A peripherally calcified centrally fat attenuation focus within the right hemiabdomen is without interval change.  There is scattered atherosclerotic calcification of the aorta and its branches. No aneurysmal dilatation.  Thin-walled bladder.  Absent uterus.  Pelvic floor laxity.  Multilevel degenerative changes of the imaged spine. No acute or aggressive appearing osseous lesion.  IMPRESSION: Status post partial right colectomy and low anterior resection.  No bowel obstruction however, there is a short segment small bowel which is mildly inflamed appearing as can be seen with a nonspecific enteritis.  There are a couple mildly prominent lymph nodes in the right lower quadrant which may be reactive.  Attention on follow-up.  Original Report Authenticated By: Waneta Martins, M.D.   Dg Abd Acute W/chest  01/26/2011  RADIOLOGY REPORT*  Clinical Data: Nausea, vomiting and diarrhea.  History of colon and rectal cancer.  ACUTE ABDOMINAL SERIES  Comparison: Previous examinations.  Findings: Normal sized  heart.  Clear lungs.  Mildly prominent interstitial markings.  Scattered air fluid levels in normal- caliber colon and small bowel.  No free peritoneal air.  Surgical staples in the right lower abdomen and in the lower pelvis.  Right abdominal calcification without significant change since the CT dated 12/24/2009.  This had an appearance on the CT suggesting calcified fat necrosis.  Lumbar spine degenerative changes.  Mild scoliosis.  Diffuse osteopenia.  IMPRESSION:  1.  Scattered air fluid levels in normal-caliber colon and small bowel.  This can be seen with gastroenteritis.  Mild ileus is also a possibility.  Low grade partial obstruction is less likely. 2.  Mild chronic interstitial lung disease.  Original Report Authenticated By: Darrol Angel, M.D.      Assessment: Nausea vomiting and diarrhea etiology unclear now with reported hematemesis. stool studies other than C. difficile pending  Plan: 1. At this point we'll proceed with EGD and flexible sigmoidoscopy 2. Continue Cipro and Flagyl for now 3. Await pending stool studies    Claire Bridge C 01/30/2011, 11:23 AM

## 2011-01-31 ENCOUNTER — Encounter (HOSPITAL_COMMUNITY): Payer: Self-pay

## 2011-01-31 ENCOUNTER — Other Ambulatory Visit: Payer: Self-pay | Admitting: Gastroenterology

## 2011-01-31 ENCOUNTER — Ambulatory Visit (HOSPITAL_COMMUNITY): Admit: 2011-01-31 | Payer: Self-pay | Admitting: Gastroenterology

## 2011-01-31 ENCOUNTER — Encounter (HOSPITAL_COMMUNITY): Admission: EM | Disposition: A | Discharge status: Home Health | Payer: Self-pay | Source: Home / Self Care

## 2011-01-31 HISTORY — PX: ESOPHAGOGASTRODUODENOSCOPY: SHX5428

## 2011-01-31 HISTORY — PX: FLEXIBLE SIGMOIDOSCOPY: SHX5431

## 2011-01-31 LAB — BASIC METABOLIC PANEL
CO2: 17 mEq/L — ABNORMAL LOW (ref 19–32)
Chloride: 113 mEq/L — ABNORMAL HIGH (ref 96–112)
Sodium: 141 mEq/L (ref 135–145)

## 2011-01-31 LAB — STOOL CULTURE

## 2011-01-31 LAB — CBC
HCT: 28.2 % — ABNORMAL LOW (ref 36.0–46.0)
MCV: 86.5 fL (ref 78.0–100.0)
RBC: 3.26 MIL/uL — ABNORMAL LOW (ref 3.87–5.11)
WBC: 9.7 10*3/uL (ref 4.0–10.5)

## 2011-01-31 LAB — GLUCOSE, CAPILLARY
Glucose-Capillary: 102 mg/dL — ABNORMAL HIGH (ref 70–99)
Glucose-Capillary: 117 mg/dL — ABNORMAL HIGH (ref 70–99)

## 2011-01-31 LAB — GIARDIA/CRYPTOSPORIDIUM SCREEN(EIA)

## 2011-01-31 SURGERY — SIGMOIDOSCOPY, FLEXIBLE
Anesthesia: Moderate Sedation

## 2011-01-31 SURGERY — EGD (ESOPHAGOGASTRODUODENOSCOPY)
Anesthesia: Moderate Sedation

## 2011-01-31 MED ORDER — FENTANYL NICU IV SYRINGE 50 MCG/ML
INJECTION | INTRAMUSCULAR | Status: DC | PRN
  Administered 2011-01-31 (×2): 25 ug via INTRAVENOUS
  Administered 2011-01-31 (×2): 10 ug via INTRAVENOUS

## 2011-01-31 MED ORDER — MIDAZOLAM HCL 10 MG/2ML IJ SOLN
INTRAMUSCULAR | Status: AC
  Filled 2011-01-31: qty 2

## 2011-01-31 MED ORDER — POTASSIUM CHLORIDE 10 MEQ/100ML IV SOLN
10.0000 meq | INTRAVENOUS | Status: AC
  Administered 2011-01-31 (×3): 10 meq via INTRAVENOUS
  Filled 2011-01-31 (×3): qty 100

## 2011-01-31 MED ORDER — POTASSIUM CHLORIDE CRYS ER 20 MEQ PO TBCR: 40.0000 meq | EXTENDED_RELEASE_TABLET | Freq: Three times a day (TID) | ORAL | Status: DC

## 2011-01-31 MED ORDER — BUTAMBEN-TETRACAINE-BENZOCAINE 2-2-14 % EX AERO
INHALATION_SPRAY | CUTANEOUS | Status: DC | PRN
  Administered 2011-01-31 (×2): 1 via TOPICAL

## 2011-01-31 MED ORDER — POTASSIUM CHLORIDE CRYS ER 20 MEQ PO TBCR
40.0000 meq | EXTENDED_RELEASE_TABLET | Freq: Three times a day (TID) | ORAL | Status: DC
  Filled 2011-01-31: qty 2

## 2011-01-31 MED ORDER — MIDAZOLAM HCL 10 MG/2ML IJ SOLN
INTRAMUSCULAR | Status: DC | PRN
  Administered 2011-01-31: 1 mg via INTRAVENOUS
  Administered 2011-01-31: 2 mg via INTRAVENOUS
  Administered 2011-01-31 (×2): 1 mg via INTRAVENOUS

## 2011-01-31 MED ORDER — FENTANYL CITRATE 0.05 MG/ML IJ SOLN
INTRAMUSCULAR | Status: AC
  Filled 2011-01-31: qty 2

## 2011-01-31 NOTE — Progress Notes (Signed)
01/31/2011  Patient transfer from 2900 to 6700. She went to endo after leaving 2900 and came to 6700. She is alert and oriented, ambulate with assist. Patient stated she is weak. She did have a small loose bowel movement and patient urine was clear. Bruise was noted on the left forearm and AC area. On the abdomen bruise from heparin sq administration. Patient sacrum is a little pinkish.   

## 2011-01-31 NOTE — Progress Notes (Signed)
[facility] Progress Note  Subjective: Still complaining of nausea and vomiting intermittently as well as diarrhea. A flexible sigmoidoscopy a rectal tube was placed last night due to incontinence.  Objective: Vital signs in last 24 hours: Temp:  [98.1 F (36.7 C)-99.2 F (37.3 C)] 99.2 F (37.3 C) (11/09 0846) Pulse Rate:  [68-86] 74  (11/09 0920) Resp:  [21-28] 23  (11/09 1005) BP: (110-148)/(42-72) 131/42 mmHg (11/09 1005) SpO2:  [94 %-99 %] 98 % (11/09 1005) Weight change:    PE: Unchanged  Lab Results: Results for orders placed during the hospital encounter of 01/27/11 (from the past 24 hour(s))  GLUCOSE, CAPILLARY     Status: Abnormal   Collection Time   01/30/11  7:41 PM      Component Value Range   Glucose-Capillary 120 (*) 70 - 99 (mg/dL)   Comment 1 Documented in Chart     Comment 2 Notify RN    GLUCOSE, CAPILLARY     Status: Abnormal   Collection Time   01/31/11 12:00 AM      Component Value Range   Glucose-Capillary 117 (*) 70 - 99 (mg/dL)   Comment 1 Notify RN     Comment 2 Documented in Chart    GLUCOSE, CAPILLARY     Status: Abnormal   Collection Time   01/31/11  4:08 AM      Component Value Range   Glucose-Capillary 102 (*) 70 - 99 (mg/dL)   Comment 1 Documented in Chart     Comment 2 Notify RN    BASIC METABOLIC PANEL     Status: Abnormal   Collection Time   01/31/11  5:55 AM      Component Value Range   Sodium 141  135 - 145 (mEq/L)   Potassium 3.1 (*) 3.5 - 5.1 (mEq/L)   Chloride 113 (*) 96 - 112 (mEq/L)   CO2 17 (*) 19 - 32 (mEq/L)   Glucose, Bld 101 (*) 70 - 99 (mg/dL)   BUN 29 (*) 6 - 23 (mg/dL)   Creatinine, Ser 4.09  0.50 - 1.10 (mg/dL)   Calcium 7.9 (*) 8.4 - 10.5 (mg/dL)   GFR calc non Af Amer 60 (*) >90 (mL/min)   GFR calc Af Amer 70 (*) >90 (mL/min)  CBC     Status: Abnormal   Collection Time   01/31/11  5:55 AM      Component Value Range   WBC 9.7  4.0 - 10.5 (K/uL)   RBC 3.26 (*) 3.87 - 5.11 (MIL/uL)   Hemoglobin 9.8  (*) 12.0 - 15.0 (g/dL)   HCT 81.1 (*) 91.4 - 46.0 (%)   MCV 86.5  78.0 - 100.0 (fL)   MCH 30.1  26.0 - 34.0 (pg)   MCHC 34.8  30.0 - 36.0 (g/dL)   RDW 78.2  95.6 - 21.3 (%)   Platelets 209  150 - 400 (K/uL)    Studies/Results: Ct Abdomen Pelvis W Contrast  01/26/2011  *RADIOLOGY REPORT*  Clinical Data: Diarrhea, vomiting.  CT ABDOMEN AND PELVIS WITH CONTRAST  Technique:  Multidetector CT imaging of the abdomen and pelvis was performed following the standard protocol during bolus administration of intravenous contrast.  Contrast: OMNIPAQUE IOHEXOL 300 MG/ML IV SOLN  Comparison: 12/24/2009  Findings: Minimal left lung base scarring versus atelectasis. Heart size upper normal limits to mildly enlarged.  No pericardial or pleural effusion.  Small hiatal hernia.  Low attenuation of the liver suggests fatty infiltration. Unremarkable biliary system, spleen, pancreas, adrenal  glands. Symmetric renal enhancement. Mild ureteral fullness on the right is similar to comparison.  No obstructing lesion identified.  No bowel obstruction. Small bowel anastomotic suture noted in the right lower quadrant anteriorly.    There is a segment of small bowel with mild wall thickening / mucosal hyperenhancement (image 64 series 2 for example).  Evidence of previous low anterior resection with no radiographic evidence for colitis. Evidence of an anastomotic suture in the right colon as well.  There are a couple small lymph nodes in this region which are nonspecific.  A peripherally calcified centrally fat attenuation focus within the right hemiabdomen is without interval change.  There is scattered atherosclerotic calcification of the aorta and its branches. No aneurysmal dilatation.  Thin-walled bladder.  Absent uterus.  Pelvic floor laxity.  Multilevel degenerative changes of the imaged spine. No acute or aggressive appearing osseous lesion.  IMPRESSION: Status post partial right colectomy and low anterior resection.  No  bowel obstruction however, there is a short segment small bowel which is mildly inflamed appearing as can be seen with a nonspecific enteritis.  There are a couple mildly prominent lymph nodes in the right lower quadrant which may be reactive.  Attention on follow-up.  Original Report Authenticated By: Waneta Martins, M.D.   Dg Abd Acute W/chest  01/26/2011  RADIOLOGY REPORT*  Clinical Data: Nausea, vomiting and diarrhea.  History of colon and rectal cancer.  ACUTE ABDOMINAL SERIES  Comparison: Previous examinations.  Findings: Normal sized heart.  Clear lungs.  Mildly prominent interstitial markings.  Scattered air fluid levels in normal- caliber colon and small bowel.  No free peritoneal air.  Surgical staples in the right lower abdomen and in the lower pelvis.  Right abdominal calcification without significant change since the CT dated 12/24/2009.  This had an appearance on the CT suggesting calcified fat necrosis.  Lumbar spine degenerative changes.  Mild scoliosis.  Diffuse osteopenia.  IMPRESSION:  1.  Scattered air fluid levels in normal-caliber colon and small bowel.  This can be seen with gastroenteritis.  Mild ileus is also a possibility.  Low grade partial obstruction is less likely. 2.  Mild chronic interstitial lung disease.  Original Report Authenticated By: Darrol Angel, M.D.   cryptosporidia and Giardia antigen pending.  EGD an unprepped colonoscopy done(see reports) no visible macroscopic abnormalities to explain symptoms. Biopsies from duodenum, terminal ileum, and transverse colon pending.     Assessment:  persistent nausea vomiting and diarrhea, presumed infectious. Without significant response to empiric antibiotics.  Plan:  we'll discontinue Cipro based on negative findings on endoscopy today. We'll continue Flagyl for now pending biopsies and remaining stool studies. Will try her on clear liquid diet.    Priscilla [name] C 01/31/2011, 10:22 AM

## 2011-01-31 NOTE — Progress Notes (Signed)
Subjective: Patient seen, EGD and colonoscopy done showed no abnormality. Patient's nausea, vomiting is improving. Objective: Filed Vitals:   01/31/11 1022 01/31/11 1032 01/31/11 1042 01/31/11 1216  BP: 127/55 127/54 148/70 161/70  Pulse: 70 72 80 69  Temp:    98.7 F (37.1 C)  TempSrc:    Oral  Resp: 20 16 16 20   Height:      Weight:      SpO2: 97% 96% 97% 96%   Weight change:   Intake/Output Summary (Last 24 hours) at 01/31/11 1403 Last data filed at 01/31/11 0800  Gross per 24 hour  Intake   1365 ml  Output    700 ml  Net    665 ml    General: Alert, awake, oriented x3, in no acute distress.  HEENT: No bruits, no goiter.  Heart: Regular rate and rhythm, without murmurs, rubs, gallops.  Lungs: Crackles bilaterally  Abdomen: Soft, Mild diffuse tenderness, nondistended, positive bowel sounds.  Neuro: Grossly intact, nonfocal. EXT:  Positive left lower ext tenderness in calf   Lab Results:  Basename 01/31/11 0555 01/30/11 0605  NA 141 140  K 3.1* 2.9*  CL 113* 112  CO2 17* 16*  GLUCOSE 101* 105*  BUN 29* 36*  CREATININE 0.89 1.05  CALCIUM 7.9* 8.1*  MG -- 1.5  PHOS -- --   No results found for this basename: AST:2,ALT:2,ALKPHOS:2,BILITOT:2,PROT:2,ALBUMIN:2 in the last 72 hours No results found for this basename: LIPASE:2,AMYLASE:2 in the last 72 hours  Basename 01/31/11 0555 01/30/11 0605  WBC 9.7 9.7  NEUTROABS -- --  HGB 9.8* 9.4*  HCT 28.2* 27.2*  MCV 86.5 86.3  PLT 209 218    Micro Results: Recent Results (from the past 240 hour(s))  STOOL CULTURE     Status: Normal   Collection Time   01/28/11 12:15 AM      Component Value Range Status Comment   Specimen Description STOOL   Final    Special Requests Normal   Final    Culture     Final    Value: NO SALMONELLA, SHIGELLA, CAMPYLOBACTER, OR YERSINIA ISOLATED   Report Status 01/31/2011 FINAL   Final   CLOSTRIDIUM DIFFICILE BY PCR     Status: Normal   Collection Time   01/28/11 12:15 AM   Component Value Range Status Comment   C difficile by pcr NEGATIVE  NEGATIVE  Final   MRSA PCR SCREENING     Status: Normal   Collection Time   01/28/11  2:36 AM      Component Value Range Status Comment   MRSA by PCR NEGATIVE  NEGATIVE  Final      Patient Active Hospital Problem List: Enteritis (01/28/2011)   Assessment: Improving   Plan: Continue with Cipro and Flagyl. Culture results are negative so far. ? Viral Acute renal failure (01/28/2011)   Assessment: Improving   Plan: Will continue the IV fluids at 75 ml/hr Dehydration (01/28/2011)   Assessment: Resolved.   Plan: Continue with iv fluids. Diarrhea (01/28/2011)   Assessment: Intermittent, flex Sig as per Dr Madilyn Fireman.   Plan: Stool studies pending. Hypotension (01/28/2011)   Assessment: resolved.   Hypokalemia:    Assesment: secondary to diarrhea.  Will start KCL 40 meq po TID. Leg Tenderness: Dopplers of lower extremities done, results are negative.    LOS: 4 days   Chan Rosasco S 01/31/2011, 2:03 PM

## 2011-01-31 NOTE — Progress Notes (Signed)
1610- to endoscopy by bed,stable, RN made aware to transfer pt to 67 35 after the procedure, report given to rn in 67 00, belongings with family.

## 2011-02-01 LAB — CBC
HCT: 28.4 % — ABNORMAL LOW (ref 36.0–46.0)
MCHC: 34.2 g/dL (ref 30.0–36.0)
Platelets: 211 10*3/uL (ref 150–400)
RDW: 13.9 % (ref 11.5–15.5)
WBC: 8 10*3/uL (ref 4.0–10.5)

## 2011-02-01 LAB — BASIC METABOLIC PANEL
Chloride: 112 mEq/L (ref 96–112)
GFR calc Af Amer: >90 mL/min (ref >90)
GFR calc non Af Amer: 79 mL/min — ABNORMAL LOW (ref >90)
Potassium: 3.2 mEq/L — ABNORMAL LOW (ref 3.5–5.1)
Sodium: 139 mEq/L (ref 135–145)

## 2011-02-01 MED ORDER — POTASSIUM CHLORIDE 10 MEQ/100ML IV SOLN
10.0000 meq | INTRAVENOUS | Status: AC
  Administered 2011-02-01 (×3): 10 meq via INTRAVENOUS
  Filled 2011-02-01 (×3): qty 100

## 2011-02-01 MED ORDER — CIPROFLOXACIN IN D5W 400 MG/200ML IV SOLN
400.0000 mg | Freq: Two times a day (BID) | INTRAVENOUS | Status: DC
  Administered 2011-02-02 – 2011-02-05 (×6): 400 mg via INTRAVENOUS
  Filled 2011-02-01 (×9): qty 200

## 2011-02-01 NOTE — Progress Notes (Signed)
Subjective: Patient seen, EGD and colonoscopy done showed no abnormality. Patient's nausea, vomiting is improving. Tolerating diet. Potassium is still low. Objective: Filed Vitals:   01/31/11 1900 01/31/11 2230 02/01/11 0540 02/01/11 0900  BP: 170/77 163/65 162/71 145/79  Pulse: 68 66 75 75  Temp: 97.8 F (36.6 C) 97.9 F (36.6 C) 98.2 F (36.8 C) 98.3 F (36.8 C)  TempSrc: Oral Oral Oral Oral  Resp: 18 18 20 20   Height:  5\' 2"  (1.575 m)    Weight:  71.1 kg (156 lb 12 oz)    SpO2: 95% 96% 98% 97%   Weight change:   Intake/Output Summary (Last 24 hours) at 02/01/11 1033 Last data filed at 02/01/11 0900  Gross per 24 hour  Intake   2100 ml  Output      5 ml  Net   2095 ml    General: Alert, awake, oriented x3, in no acute distress.  HEENT: No bruits, no goiter.  Heart: Regular rate and rhythm, without murmurs, rubs, gallops.  Lungs: Crackles bilaterally  Abdomen: Soft, Mild diffuse tenderness, nondistended, positive bowel sounds.  Neuro: Grossly intact, nonfocal. EXT:  Positive left lower ext tenderness in calf   Lab Results:  Basename 02/01/11 0548 01/31/11 0555 01/30/11 0605  NA 139 141 --  K 3.2* 3.1* --  CL 112 113* --  CO2 17* 17* --  GLUCOSE 120* 101* --  BUN 20 29* --  CREATININE 0.76 0.89 --  CALCIUM 7.5* 7.9* --  MG -- -- 1.5  PHOS -- -- --   No results found for this basename: AST:2,ALT:2,ALKPHOS:2,BILITOT:2,PROT:2,ALBUMIN:2 in the last 72 hours No results found for this basename: LIPASE:2,AMYLASE:2 in the last 72 hours  Basename 02/01/11 0548 01/31/11 0555  WBC 8.0 9.7  NEUTROABS -- --  HGB 9.7* 9.8*  HCT 28.4* 28.2*  MCV 87.7 86.5  PLT 211 209    Micro Results: Recent Results (from the past 240 hour(s))  STOOL CULTURE     Status: Normal   Collection Time   01/28/11 12:15 AM      Component Value Range Status Comment   Specimen Description STOOL   Final    Special Requests Normal   Final    Culture     Final    Value: NO SALMONELLA,  SHIGELLA, CAMPYLOBACTER, OR YERSINIA ISOLATED   Report Status 01/31/2011 FINAL   Final   CLOSTRIDIUM DIFFICILE BY PCR     Status: Normal   Collection Time   01/28/11 12:15 AM      Component Value Range Status Comment   C difficile by pcr NEGATIVE  NEGATIVE  Final   MRSA PCR SCREENING     Status: Normal   Collection Time   01/28/11  2:36 AM      Component Value Range Status Comment   MRSA by PCR NEGATIVE  NEGATIVE  Final      Patient Active Hospital Problem List: Enteritis (01/28/2011)   Assessment: Improving   Plan: Continue with Cipro and Flagyl. Culture results are negative so far. ? Viral Acute renal failure (01/28/2011)   Assessment:Resolved.   Plan: Will continue the IV fluids at 75 ml/hr Dehydration (01/28/2011)   Assessment: Resolved.   Plan: Continue with iv fluids. Diarrhea (01/28/2011) Improving.   Assessment: Intermittent,no significant abnormality on flex sig.   Plan: Stool studies pending. Hypotension (01/28/2011)   Assessment: resolved.   Hypokalemia:    Assesment: secondary to diarrhea.     Patient unable to tolerate Po Potassium, so  started on IV Potassium.    Recheck potassium levels in am. Leg Tenderness: Dopplers of lower extremities done, results are negative.    LOS: 5 days   Priscilla [name] S 02/01/2011, 10:33 AM

## 2011-02-01 NOTE — Progress Notes (Signed)
Priscilla [name] 11:30 AM  Subjective: The patient is much better. She does not know why or how however she was able to eat soft solids today and her diarrhea is better and her pain is better although she is slightly distended. She tolerated her procedures well and has no new complaints  Objective: Vital signs stable afebrile no acute distress Abdomen slight distended minimal tenderness no guarding or rebound decreased but present bowel sounds. Labs stable Assessment: Improved  Plan: Await biopsies agree with discharge tomorrow if doing well with close followup with my partner Dr. Madilyn Fireman and I've instructed her if she does go home to call for the biopsies next week. We discussed how she probably had a virus or food-induced gastroenteritis and reviewed her negative tests so far  [facility] E

## 2011-02-02 ENCOUNTER — Inpatient Hospital Stay (HOSPITAL_COMMUNITY): Payer: Medicare Other

## 2011-02-02 LAB — CBC
HCT: 29 % — ABNORMAL LOW (ref 36.0–46.0)
Hemoglobin: 10 g/dL — ABNORMAL LOW (ref 12.0–15.0)
RBC: 3.36 MIL/uL — ABNORMAL LOW (ref 3.87–5.11)
WBC: 7.2 10*3/uL (ref 4.0–10.5)

## 2011-02-02 LAB — BASIC METABOLIC PANEL
BUN: 13 mg/dL (ref 6–23)
Chloride: 110 mEq/L (ref 96–112)
GFR calc non Af Amer: 82 mL/min — ABNORMAL LOW (ref >90)
Glucose, Bld: 112 mg/dL — ABNORMAL HIGH (ref 70–99)
Potassium: 3.1 mEq/L — ABNORMAL LOW (ref 3.5–5.1)
Sodium: 139 mEq/L (ref 135–145)

## 2011-02-02 MED ORDER — MAGNESIUM SULFATE BOLUS VIA INFUSION
2.0000 g | Freq: Once | INTRAVENOUS | Status: DC
  Administered 2011-02-02: 2 g via INTRAVENOUS
  Filled 2011-02-02 (×3): qty 500

## 2011-02-02 MED ORDER — MAGNESIUM SULFATE 40 MG/ML IJ SOLN
2.0000 g | Freq: Once | INTRAMUSCULAR | Status: AC
  Administered 2011-02-02: 2 g via INTRAVENOUS
  Filled 2011-02-02: qty 50

## 2011-02-02 MED ORDER — POTASSIUM CHLORIDE 10 MEQ/100ML IV SOLN
10.0000 meq | INTRAVENOUS | Status: AC
  Administered 2011-02-02 (×2): 10 meq via INTRAVENOUS
  Filled 2011-02-02 (×3): qty 100

## 2011-02-02 NOTE — Progress Notes (Addendum)
Subjective: Patient seen, started having nausea, vomiting again.complains of shortness of breath. Objective: Filed Vitals:   02/01/11 2000 02/01/11 2243 02/02/11 0520 02/02/11 0900  BP: 116/59  152/72 128/68  Pulse: 77 78 69 74  Temp: 98.5 F (36.9 C)  97.9 F (36.6 C) 98.5 F (36.9 C)  TempSrc: Oral  Oral Oral  Resp: 20  16 18   Height: 5\' 2"  (1.575 m)     Weight: 72.213 kg (159 lb 3.2 oz)     SpO2: 95%  98% 95%   Weight change: 1.113 kg (2 lb 7.3 oz)  Intake/Output Summary (Last 24 hours) at 02/02/11 1336 Last data filed at 02/02/11 0900  Gross per 24 hour  Intake    658 ml  Output      1 ml  Net    657 ml    General: Alert, awake, oriented x3, in no acute distress.  HEENT: No bruits, no goiter.  Heart: Regular rate and rhythm, without murmurs, rubs, gallops.  Lungs: Clear bilaterally Abdomen: Soft, Mild diffuse tenderness, nondistended, positive bowel sounds.  Neuro: Grossly intact, nonfocal. EXT: trace edema bilaterally...   Lab Results:  [facility] 02/02/11 0608 02/01/11 0548  NA 139 139  K 3.1* 3.2*  CL 110 112  CO2 19 17*  GLUCOSE 112* 120*  BUN 13 20  CREATININE 0.67 0.76  CALCIUM 7.7* 7.5*  MG -- --  PHOS -- --   No results found for this basename: AST:2,ALT:2,ALKPHOS:2,BILITOT:2,PROT:2,ALBUMIN:2 in the last 72 hours No results found for this basename: LIPASE:2,AMYLASE:2 in the last 72 hours  Basename 02/02/11 0608 02/01/11 0548  WBC 7.2 8.0  NEUTROABS -- --  HGB 10.0* 9.7*  HCT 29.0* 28.4*  MCV 86.3 87.7  PLT 242 211    Micro Results: Recent Results (from the past 240 hour([name]))  STOOL CULTURE     Status: Normal   Collection Time   01/28/11 12:15 AM      Component Value Range Status Comment   Specimen Description STOOL   Final    Special Requests Normal   Final    Culture     Final    Value: NO SALMONELLA, SHIGELLA, CAMPYLOBACTER, OR YERSINIA ISOLATED   Report Status 01/31/2011 FINAL   Final   CLOSTRIDIUM DIFFICILE BY PCR     Status: Normal    Collection Time   01/28/11 12:15 AM      Component Value Range Status Comment   C difficile by pcr NEGATIVE  NEGATIVE  Final   MRSA PCR SCREENING     Status: Normal   Collection Time   01/28/11  2:36 AM      Component Value Range Status Comment   MRSA by PCR NEGATIVE  NEGATIVE  Final      Patient Active Hospital Problem List: Gastroenteritis (01/28/2011)   Assessment: Nausea, vomiting restarted.   Plan: Continue with Cipro and Flagyl. Culture results are negative so far. ? Viral Will check Abdomen xray. Acute renal failure (01/28/2011)   Assessment:Resolved.   Plan: Will continue the IV fluids at 50 ml/hr Dehydration (01/28/2011)   Assessment: Resolved.   Plan: Continue with iv fluids. Diarrhea (01/28/2011) Improving.   Assessment: Intermittent,no significant abnormality on flex sig.   Plan: Stool studies pending. Hypotension (01/28/2011)   Assessment: resolved.   Hypokalemia:    Assesment: secondary to diarrhea.     Patient unable to tolerate Po Potassium, so started on IV Potassium.    Recheck potassium levels in am. Also given IV magnesium sulfate.  Dyspnea: Check CXR. Most likely due to fluid overload, wbc is normal.IV fluids have been cut down to 50 ml/hr. DVT Prophylaxis: Heparin. Leg Tenderness: Dopplers of lower extremities done, results are negative.    LOS: 6 days   Priscilla [name] 02/02/2011, 1:36 PM

## 2011-02-02 NOTE — Progress Notes (Signed)
Priscilla [name] 10:04 AM  Subjective: Unfortunately the patient has had a relapse with increased abdominal pain nausea and vomiting and abdominal distention and she also complains of shortness of breath and increased pedal edema. Her diarrhea is better however and she has no other new complaints and her case was discussed with the hospital team as well as her daughter  Objective: Vital signs stable afebrile no acute distress sclera nonicteric Abdomen slightly tender throughout slight distention minimal tympany decreased but occasional bowel sound no rebound Mild pedal edema Labs stable WBC does not increase  Assessment: Recurrence of symptoms questionable ileus versus partial obstruction  Plan: Check liver tests and lipase just to be sure replete potassium as you are doing recheck three-way abdomen to rule out obstruction and causes of shortness of breath and continue to wait on biopsies and will continue to follow and return patient to clear liquids I have discussed all of the above with the hospital team and will allow them to order above  [facility] E

## 2011-02-03 ENCOUNTER — Encounter (HOSPITAL_COMMUNITY): Payer: Self-pay

## 2011-02-03 DIAGNOSIS — K56609 Unspecified intestinal obstruction, unspecified as to partial versus complete obstruction: Secondary | ICD-10-CM

## 2011-02-03 DIAGNOSIS — Z85038 Personal history of other malignant neoplasm of large intestine: Secondary | ICD-10-CM

## 2011-02-03 LAB — COMPREHENSIVE METABOLIC PANEL
ALT: 9 U/L (ref 0–35)
Alkaline Phosphatase: 101 U/L (ref 39–117)
BUN: 10 mg/dL (ref 6–23)
CO2: 21 mEq/L (ref 19–32)
Calcium: 7.3 mg/dL — ABNORMAL LOW (ref 8.4–10.5)
GFR calc Af Amer: >90 mL/min (ref >90)
GFR calc non Af Amer: 83 mL/min — ABNORMAL LOW (ref >90)
Glucose, Bld: 88 mg/dL (ref 70–99)
Potassium: 3 mEq/L — ABNORMAL LOW (ref 3.5–5.1)
Sodium: 137 mEq/L (ref 135–145)
Total Protein: 4.8 g/dL — ABNORMAL LOW (ref 6.0–8.3)

## 2011-02-03 LAB — CBC
HCT: 27.2 % — ABNORMAL LOW (ref 36.0–46.0)
Hemoglobin: 9.4 g/dL — ABNORMAL LOW (ref 12.0–15.0)
MCH: 29.8 pg (ref 26.0–34.0)
MCHC: 34.6 g/dL (ref 30.0–36.0)
RBC: 3.15 MIL/uL — ABNORMAL LOW (ref 3.87–5.11)

## 2011-02-03 LAB — LIPASE, BLOOD: Lipase: 122 U/L — ABNORMAL HIGH (ref 11–59)

## 2011-02-03 MED ORDER — SODIUM CHLORIDE 0.9 % IJ SOLN
10.0000 mL | INTRAMUSCULAR | Status: DC | PRN
  Administered 2011-02-04: 10 mL

## 2011-02-03 MED ORDER — SODIUM CHLORIDE 0.9 % IV SOLN
INTRAVENOUS | Status: DC
  Administered 2011-02-03 – 2011-02-04 (×2): via INTRAVENOUS

## 2011-02-03 MED ORDER — SODIUM CHLORIDE 0.9 % IJ SOLN
10.0000 mL | Freq: Two times a day (BID) | INTRAMUSCULAR | Status: DC
  Administered 2011-02-03 – 2011-02-04 (×2): 10 mL

## 2011-02-03 NOTE — Progress Notes (Signed)
[facility] Progress Note  Subjective: Still having intermittent nausea vomiting and diarrhea as well as vague abdominal pain. Presents at 2  Objective: Vital signs in last 24 hours: Temp:  [98.2 F (36.8 C)-98.5 F (36.9 C)] 98.4 F (36.9 C) (11/12 0532) Pulse Rate:  [72-77] 73  (11/12 0532) Resp:  [18-20] 18  (11/12 0532) BP: (129-166)/(65-80) 129/75 mmHg (11/12 0532) SpO2:  [95 %-99 %] 99 % (11/12 0532) Weight:  [72.576 kg (160 lb)] 160 lb (72.576 kg) (11/11 2316) Weight change: 0.363 kg (12.8 oz)   PE: Abdomen soft slightly distended with normoactive bowel sounds. No hepatosplenomegaly mass or guarding.  Lab Results: Results for orders placed during the hospital encounter of 01/27/11 (from the past 24 hour(s))  CBC     Status: Abnormal   Collection Time   02/03/11  6:55 AM      Component Value Range   WBC 7.5  4.0 - 10.5 (K/uL)   RBC 3.15 (*) 3.87 - 5.11 (MIL/uL)   Hemoglobin 9.4 (*) 12.0 - 15.0 (g/dL)   HCT 16.1 (*) 09.6 - 46.0 (%)   MCV 86.3  78.0 - 100.0 (fL)   MCH 29.8  26.0 - 34.0 (pg)   MCHC 34.6  30.0 - 36.0 (g/dL)   RDW 04.5  40.9 - 81.1 (%)   Platelets 227  150 - 400 (K/uL)  COMPREHENSIVE METABOLIC PANEL     Status: Abnormal   Collection Time   02/03/11  6:55 AM      Component Value Range   Sodium 137  135 - 145 (mEq/L)   Potassium 3.0 (*) 3.5 - 5.1 (mEq/L)   Chloride 107  96 - 112 (mEq/L)   CO2 21  19 - 32 (mEq/L)   Glucose, Bld 88  70 - 99 (mg/dL)   BUN 10  6 - 23 (mg/dL)   Creatinine, Ser 9.14  0.50 - 1.10 (mg/dL)   Calcium 7.3 (*) 8.4 - 10.5 (mg/dL)   Total Protein 4.8 (*) 6.0 - 8.3 (g/dL)   Albumin 2.1 (*) 3.5 - 5.2 (g/dL)   AST 13  0 - 37 (U/L)   ALT 9  0 - 35 (U/L)   Alkaline Phosphatase 101  39 - 117 (U/L)   Total Bilirubin 0.3  0.3 - 1.2 (mg/dL)   GFR calc non Af Amer 83 (*) >90 (mL/min)   GFR calc Af Amer >90  >90 (mL/min)  LIPASE, BLOOD     Status: Abnormal   Collection Time   02/03/11  6:55 AM      Component Value Range     Lipase 122 (*) 11 - 59 (U/L)    Studies/Results: Dg Chest 2 View  02/02/2011  *RADIOLOGY REPORT*  Clinical Data: Shortness of breath.  CHEST - 2 VIEW  Comparison: 04/16/2010  Findings: There are new small bilateral pleural effusions.  The lungs are otherwise clear.  Borderline cardiomegaly.  Vascularity is normal.  No acute osseous abnormality.  IMPRESSION: Small bilateral pleural effusions.  Original Report Authenticated By: Gwynn Burly, M.D.   Ct Abdomen Pelvis W Contrast  01/26/2011  *RADIOLOGY REPORT*  Clinical Data: Diarrhea, vomiting.  CT ABDOMEN AND PELVIS WITH CONTRAST  Technique:  Multidetector CT imaging of the abdomen and pelvis was performed following the standard protocol during bolus administration of intravenous contrast.  Contrast: OMNIPAQUE IOHEXOL 300 MG/ML IV SOLN  Comparison: 12/24/2009  Findings: Minimal left lung base scarring versus atelectasis. Heart size upper normal limits to mildly enlarged.  No pericardial  or pleural effusion.  Small hiatal hernia.  Low attenuation of the liver suggests fatty infiltration. Unremarkable biliary system, spleen, pancreas, adrenal glands. Symmetric renal enhancement. Mild ureteral fullness on the right is similar to comparison.  No obstructing lesion identified.  No bowel obstruction. Small bowel anastomotic suture noted in the right lower quadrant anteriorly.    There is a segment of small bowel with mild wall thickening / mucosal hyperenhancement (image 64 series 2 for example).  Evidence of previous low anterior resection with no radiographic evidence for colitis. Evidence of an anastomotic suture in the right colon as well.  There are a couple small lymph nodes in this region which are nonspecific.  A peripherally calcified centrally fat attenuation focus within the right hemiabdomen is without interval change.  There is scattered atherosclerotic calcification of the aorta and its branches. No aneurysmal dilatation.  Thin-walled  bladder.  Absent uterus.  Pelvic floor laxity.  Multilevel degenerative changes of the imaged spine. No acute or aggressive appearing osseous lesion.  IMPRESSION: Status post partial right colectomy and low anterior resection.  No bowel obstruction however, there is a short segment small bowel which is mildly inflamed appearing as can be seen with a nonspecific enteritis.  There are a couple mildly prominent lymph nodes in the right lower quadrant which may be reactive.  Attention on follow-up.  Original Report Authenticated By: Waneta Martins, M.D.   Dg Abd 2 Views  02/02/2011  *RADIOLOGY REPORT*  Clinical Data: Nausea and vomiting.  ABDOMEN - 2 VIEW  Comparison: CT scan dated 01/26/2011 and radiographs dated 01/26/2011  Findings: Since the prior exams the patient has developed dilatation of small bowel loops in the mid abdomen.  There is increased air in the nondistended colon.  No free air.  Small bilateral pleural effusions.  Surgical staples consistent with the bowel anastomoses are noted.  IMPRESSION: Findings consistent with new partial small bowel obstruction.  Original Report Authenticated By: Gwynn Burly, M.D.   Dg Abd Acute W/chest  01/26/2011  RADIOLOGY REPORT*  Clinical Data: Nausea, vomiting and diarrhea.  History of colon and rectal cancer.  ACUTE ABDOMINAL SERIES  Comparison: Previous examinations.  Findings: Normal sized heart.  Clear lungs.  Mildly prominent interstitial markings.  Scattered air fluid levels in normal- caliber colon and small bowel.  No free peritoneal air.  Surgical staples in the right lower abdomen and in the lower pelvis.  Right abdominal calcification without significant change since the CT dated 12/24/2009.  This had an appearance on the CT suggesting calcified fat necrosis.  Lumbar spine degenerative changes.  Mild scoliosis.  Diffuse osteopenia.  IMPRESSION:  1.  Scattered air fluid levels in normal-caliber colon and small bowel.  This can be seen with  gastroenteritis.  Mild ileus is also a possibility.  Low grade partial obstruction is less likely. 2.  Mild chronic interstitial lung disease.  Original Report Authenticated By: Darrol Angel, M.D.   Repeat KUB consistent with new partial small bowel obstruction. Pathology from EGD colonoscopy pending.   Assessment: 1. Persistent nausea vomiting abdominal pain and diarrhea now with evidence of partial small bowel obstruction. EGD colonoscopy relatively unrevealing, no response to empiric antibiotics.  Plan: 1. We'll discontinue Flagyl. Continue on clear liquid diet and await path from EGD and colonoscopy. 2. We'll go ahead and get a surgical consultation regarding possibility of small bowel obstruction in the patient has had 2 colon cancer resections in the past.   Lakima Dona C 02/03/2011, 9:14 AM

## 2011-02-03 NOTE — Consult Note (Signed)
Reason for Consult:Lama Referring Physician: PSBO   HPI: Priscilla [name] is an 77 y.o. female with prior history of colon cancer requiring multiple procedures including LAR about 4 yrs ago by Dr. Abbey Chatters. She has done well. About 2 weeks ago she began having several bouts of diarrhea. She has more frequent stools in general as a result of her surgeries but this was even more frequent.  She has also had N/V as well and at this point cannot hold down even liquids. She was admitted about a week ago after CT showed evidence of enteritis. She Has been seen by GI who has scoped her without findings. Her prior colonic anastamoses appear nl. However, she cont to have N/V. She has become more distended and x-rays yesterday showed new evidence of PSBO. She states she is still having a few loose stools and only a small amy of flatus. Surgery consult requested.  Past Medical History:  Past Medical History  Diagnosis Date  . Hypertension   . Chest pain, atypical   . OA (osteoarthritis)   . IDA (iron deficiency anemia)   . Heart murmur   . Deep vein thrombosis   . Osteoarthritis   . Cancer     colon, rectal    Surgical History:  Past Surgical History  Procedure Date  . Nuclear stress test 11/17/08    NORMAL  . Replacement total knee     RIGHT KNEE  . Colonoscopy 02/2010  . Ileostomy 04/08/2007  . Colon surgery 2001 and 2008  . Appendectomy   . Colectomy   . Colostomy   . Abdominal hysterectomy   . Joint replacement 2010    knee     Family History:  Family History  Problem Relation Age of Onset  . Heart disease Mother   . Heart disease Father   . Lupus Father     Social History:  reports that she has never smoked. She does not have any smokeless tobacco history on file. She reports that she does not drink alcohol or use illicit drugs.  Allergies:  Allergies  Allergen Reactions  . Morphine And Related Nausea And Vomiting  . Lisinopril Cough    Medications: I have reviewed  the patient's current medications.  ROS: See HPI for pertinent findings, otherwise complete 10 system review negative.  Physical Exam: Blood pressure 152/76, pulse 79, temperature 98.4 F (36.9 C), temperature source Oral, resp. rate 20, height 5\' 2"  (1.575 m), weight 160 lb (72.576 kg), SpO2 95.00%.  General Appearance:  Alert, cooperative, no distress, appears stated age  Head:  Normocephalic, without obvious abnormality, atraumatic  ENT: Unremarkable  Neck: Supple, symmetrical, trachea midline, no adenopathy, thyroid: not enlarged, symmetric, no tenderness/mass/nodules  Lungs:   Clear to auscultation bilaterally, no w/r/r, respirations unlabored without use of accessory muscles.  Chest Wall:  No tenderness or deformity  Heart:  Regular rate and rhythm, S1, S2 normal, no murmur, rub or gallop. Carotids 2+ without bruit.  Abdomen:   Soft, mildly distended. Few BS. No masses, hernias  Genitalia:  Deferred  Rectal:  Deferred.  Extremities: Extremities normal, atraumatic, no cyanosis or edema  Pulses: 2+ and symmetric  Skin: Skin color, texture, turgor normal, no rashes or lesions  Neurologic: Normal affect, no gross deficits.     Labs: CBC  Basename 02/03/11 0655 02/02/11 0608  WBC 7.5 7.2  HGB 9.4* 10.0*  HCT 27.2* 29.0*  PLT 227 242   MET  Basename 02/03/11 0655 02/02/11 0608  NA 137  139  K 3.0* 3.1*  CL 107 110  CO2 21 19  GLUCOSE 88 112*  BUN 10 13  CREATININE 0.65 0.67  CALCIUM 7.3* 7.7*    Basename 02/03/11 0655  PROT 4.8*  ALBUMIN 2.1*  AST 13  ALT 9  ALKPHOS 101  BILITOT 0.3  BILIDIR --  IBILI --  LIPASE 122*   PT/INR No results found for this basename: LABPROT:2,INR:2 in the last 72 hours ABG No results found for this basename: PHART:2,PCO2:2,PO2:2,HCO3:2 in the last 72 hours  Imaging:  Dg Abd 2 Views  02/02/2011  *RADIOLOGY REPORT*  Clinical Data: Nausea and vomiting.  ABDOMEN - 2 VIEW  Comparison: CT scan dated 01/26/2011 and radiographs  dated 01/26/2011  Findings: Since the prior exams the patient has developed dilatation of small bowel loops in the mid abdomen.  There is increased air in the nondistended colon.  No free air.  Small bilateral pleural effusions.  Surgical staples consistent with the bowel anastomoses are noted.  IMPRESSION: Findings consistent with new partial small bowel obstruction.  Original Report Authenticated By: Gwynn Burly, M.D.    Assessment/Plan: Principal Problem:  *Enteritis Active Problems:  Acute renal failure  Dehydration  Diarrhea  Hypotension  Hypokalemia PSBO-as there is air in the colon on X-ray and pt with flatus. Recommend NPO x ice chips. May need NGT if no resolution of N/V ?Repeat CT or SBFT to assess area of 'enteritis' seen on prior CT. Will d/w MD.   Brayton El F 02/03/2011, 2:34 PM

## 2011-02-03 NOTE — Progress Notes (Signed)
Subjective: Patient seen, started having nausea, vomiting again.complains of shortness of breath. Objective: Filed Vitals:   02/03/11 0532 02/03/11 0945 02/03/11 1100 02/03/11 1400  BP: 129/75 137/56 152/76   Pulse: 73 69 79 82  Temp: 98.4 F (36.9 C) 98.4 F (36.9 C) 98.4 F (36.9 C) 98 F (36.7 C)  TempSrc: Oral Oral Oral Oral  Resp: 18 20 20 20   Height:      Weight:      SpO2: 99% 96% 95% 96%   Weight change: 0.363 kg (12.8 oz)  Intake/Output Summary (Last 24 hours) at 02/03/11 1714 Last data filed at 02/03/11 1500  Gross per 24 hour  Intake   1195 ml  Output      8 ml  Net   1187 ml    General: Alert, awake, oriented x3, in no acute distress.  HEENT: No bruits, no goiter.  Heart: Regular rate and rhythm, without murmurs, rubs, gallops.  Lungs: Clear bilaterally Abdomen: Soft, Mild diffuse tenderness, nondistended, positive bowel sounds.  Neuro: Grossly intact, nonfocal. EXT: trace edema bilaterally...   Lab Results:  [facility] 02/03/11 0655 02/02/11 0608  NA 137 139  K 3.0* 3.1*  CL 107 110  CO2 21 19  GLUCOSE 88 112*  BUN 10 13  CREATININE 0.65 0.67  CALCIUM 7.3* 7.7*  MG -- --  PHOS -- --    Basename 02/03/11 0655  AST 13  ALT 9  ALKPHOS 101  BILITOT 0.3  PROT 4.8*  ALBUMIN 2.1*    Basename 02/03/11 0655  LIPASE 122*  AMYLASE --    Basename 02/03/11 0655 02/02/11 0608  WBC 7.5 7.2  NEUTROABS -- --  HGB 9.4* 10.0*  HCT 27.2* 29.0*  MCV 86.3 86.3  PLT 227 242    Micro Results: Recent Results (from the past 240 hour(s))  STOOL CULTURE     Status: Normal   Collection Time   01/28/11 12:15 AM      Component Value Range Status Comment   Specimen Description STOOL   Final    Special Requests Normal   Final    Culture     Final    Value: NO SALMONELLA, SHIGELLA, CAMPYLOBACTER, OR YERSINIA ISOLATED   Report Status 01/31/2011 FINAL   Final   CLOSTRIDIUM DIFFICILE BY PCR     Status: Normal   Collection Time   01/28/11 12:15 AM   Component Value Range Status Comment   C difficile by pcr NEGATIVE  NEGATIVE  Final   MRSA PCR SCREENING     Status: Normal   Collection Time   01/28/11  2:36 AM      Component Value Range Status Comment   MRSA by PCR NEGATIVE  NEGATIVE  Final      Partial small bowel obstruction: Surgery consulted. Will check Abdomen xray. Acute renal failure (01/28/2011)   Assessment:Resolved.   Plan: Wilnt: Resolved.   Plan: Conl continue the IV fluids at 50 ml/hr Dehydration (01/28/2011)   Assessmetinue with iv fluids. Diarrhea (01/28/2011) Improving.   Assessment: Intermittent,no significant abnormality on flex sig.   Plan: Stool studies pending. Hypotension (01/28/2011)   Assessment: resolved.   Hypokalemia:    Assesment: secondary to diarrhea.     Patient unable to tolerate Po Potassium, so started on IV Potassium.    Recheck potassium levels in am. Dyspnea: Resolved. CXR shows small bilateral pleural effusions. DVT Prophylaxis: Heparin. Leg Tenderness: Dopplers of lower extremities done, results are negative.    LOS: 7 days  Yulia Ulrich S 02/03/2011, 5:14 PM

## 2011-02-04 ENCOUNTER — Inpatient Hospital Stay (HOSPITAL_COMMUNITY): Payer: Medicare Other

## 2011-02-04 MED ORDER — SODIUM CHLORIDE 0.9 % IV SOLN
1.0000 g | INTRAVENOUS | Status: AC
  Administered 2011-02-05: 1 g via INTRAVENOUS
  Filled 2011-02-04: qty 1

## 2011-02-04 MED ORDER — POTASSIUM CHLORIDE 10 MEQ/100ML IV SOLN
10.0000 meq | INTRAVENOUS | Status: AC
  Administered 2011-02-04 (×3): 10 meq via INTRAVENOUS
  Filled 2011-02-04 (×3): qty 100

## 2011-02-04 MED ORDER — DIPHENHYDRAMINE HCL 12.5 MG/5ML PO ELIX
12.5000 mg | ORAL_SOLUTION | Freq: Every evening | ORAL | Status: DC | PRN
  Filled 2011-02-04: qty 5

## 2011-02-04 MED ORDER — HYDROMORPHONE HCL PF 1 MG/ML IJ SOLN: 0.5000 mg | Freq: Once | INTRAMUSCULAR | Status: DC

## 2011-02-04 NOTE — Progress Notes (Signed)
  4 Days Post-Op  Subjective: Pt with less nausea and vomiting.  No further diarrhea.  Lots of flatus this am.  Objective: Vital signs in last 24 hours: Temp:  [97.9 F (36.6 C)-98.5 F (36.9 C)] 98.5 F (36.9 C) (11/13 0557) Pulse Rate:  [69-82] 82  (11/13 0557) Resp:  [18-20] 18  (11/13 0557) BP: (137-161)/(56-76) 150/66 mmHg (11/13 0557) SpO2:  [95 %-98 %] 95 % (11/13 0557) Last BM Date: 02/03/11  Intake/Output from previous day: 11/12 0701 - 11/13 0700 In: 1260 [P.O.:340; I.V.:645; IV Piggyback:250] Out: 3 [Urine:2; Stool:1] Intake/Output this shift:    PE: Abd: soft, hyperactive BS, ND, minimal tenderness in mid abd HT: reg Lungs: CTAB  Lab Results:   [facility] 02/03/11 0655 02/02/11 0608  WBC 7.5 7.2  HGB 9.4* 10.0*  HCT 27.2* 29.0*  PLT 227 242   BMET  Basename 02/03/11 0655 02/02/11 0608  NA 137 139  K 3.0* 3.1*  CL 107 110  CO2 21 19  GLUCOSE 88 112*  BUN 10 13  CREATININE 0.65 0.67  CALCIUM 7.3* 7.7*   PT/INR No results found for this basename: LABPROT:2,INR:2 in the last 72 hours   Studies/Results: @RISRSLT2 @  Anti-infectives: Anti-infectives     Start     Dose/Rate Route Frequency Ordered Stop   02/02/11 0015   ciprofloxacin (CIPRO) IVPB 400 mg        400 mg 200 mL/hr over 60 Minutes Intravenous Every 12 hours 02/01/11 1314     01/29/11 1200   ciprofloxacin (CIPRO) IVPB 400 mg  Status:  Discontinued        400 mg 200 mL/hr over 60 Minutes Intravenous Every 24 hours 01/28/11 1538 02/01/11 1314   01/28/11 0945   ciprofloxacin (CIPRO) IVPB 400 mg  Status:  Discontinued        400 mg 200 mL/hr over 60 Minutes Intravenous Every 12 hours 01/28/11 0933 01/28/11 1537   01/28/11 0945   metroNIDAZOLE (FLAGYL) IVPB 500 mg  Status:  Discontinued        500 mg 100 mL/hr over 60 Minutes Intravenous Every 8 hours 01/28/11 0933 02/03/11 0913           Assessment/Plan  1. Enteritis 2. ?PSBO  Plan: Will await UGI, SBFT.  Pt seems better  this morning.  Will await radiology results and follow.   LOS: 8 days    Priscilla [name] E 02/04/2011

## 2011-02-04 NOTE — Progress Notes (Signed)
I have seen and examined the patient and agree with the assessment and plans  

## 2011-02-04 NOTE — Progress Notes (Signed)
HPI: 77 y.o. Cauc female who was seen in ER on Saturday for Nausea, Vomiting, & diarrhea. Was given Iv fluids and release to f/u with Gastroenterologist Dr Evette Cristal. She went to see him today not doing any better and he referred her back to ER. She state that the diarrhea is uncontrollable going several times an hour . She several episodes of emesis through out the day. She c/o mild cramping abdominal pain. She denies fever or chills. She c/o muscle achesin her leg and back.  Patient was initially treated in the step down unit with iv fluids, stool cultures were sent which was negative, she also underwent egd and flex sig, which was negative . Bx were obtained, results are pending. Patient continued to have nausea/vomiting and diarrhea. Abdomen xray showed partial SBO, so surgery was consulted. Small bowel series ordered, shows high grade partial obstruction with transition point in the dital jejunum or proximal ileum. Patient also had negative dopplers of lower extremities.     Subjective: Patient seen, started having nausea, vomiting again.complains of shortness of breath. Objective: Filed Vitals:   02/03/11 2124 02/04/11 0557 02/04/11 1030 02/04/11 1300  BP: 161/67 150/66 139/65 129/62  Pulse: 78 82 75 77  Temp: 98.2 F (36.8 C) 98.5 F (36.9 C) 98.3 F (36.8 C) 98 F (36.7 C)  TempSrc: Oral Oral Oral Oral  Resp: 20 18 20 20   Height:      Weight:      SpO2: 95% 95% 96% 96%   Weight change:   Intake/Output Summary (Last 24 hours) at 02/04/11 1504 Last data filed at 02/04/11 1300  Gross per 24 hour  Intake   1405 ml  Output      1 ml  Net   1404 ml    General: Alert, awake, oriented x3, in no acute distress.  HEENT: No bruits, no goiter.  Heart: Regular rate and rhythm, without murmurs, rubs, gallops.  Lungs: Clear bilaterally Abdomen: Soft, Mild diffuse tenderness, nondistended, positive bowel sounds.  Neuro: Grossly intact, nonfocal. EXT: trace edema bilaterally...   Lab  Results:  [facility] 02/03/11 0655 02/02/11 0608  NA 137 139  K 3.0* 3.1*  CL 107 110  CO2 21 19  GLUCOSE 88 112*  BUN 10 13  CREATININE 0.65 0.67  CALCIUM 7.3* 7.7*  MG -- --  PHOS -- --    Basename 02/03/11 0655  AST 13  ALT 9  ALKPHOS 101  BILITOT 0.3  PROT 4.8*  ALBUMIN 2.1*    Basename 02/03/11 0655  LIPASE 122*  AMYLASE --    Basename 02/03/11 0655 02/02/11 0608  WBC 7.5 7.2  NEUTROABS -- --  HGB 9.4* 10.0*  HCT 27.2* 29.0*  MCV 86.3 86.3  PLT 227 242    Micro Results: Recent Results (from the past 240 hour(s))  STOOL CULTURE     Status: Normal   Collection Time   01/28/11 12:15 AM      Component Value Range Status Comment   Specimen Description STOOL   Final    Special Requests Normal   Final    Culture     Final    Value: NO SALMONELLA, SHIGELLA, CAMPYLOBACTER, OR YERSINIA ISOLATED   Report Status 01/31/2011 FINAL   Final   CLOSTRIDIUM DIFFICILE BY PCR     Status: Normal   Collection Time   01/28/11 12:15 AM      Component Value Range Status Comment   C difficile by pcr NEGATIVE  NEGATIVE  Final  MRSA PCR SCREENING     Status: Normal   Collection Time   01/28/11  2:36 AM      Component Value Range Status Comment   MRSA by PCR NEGATIVE  NEGATIVE  Final      Partial small bowel obstruction: Surgery consulted.  Acute renal failure (01/28/2011)   Assessment   Plan:  Resolved.   Plan:  continue the IV fluids at 50 ml/hr Dehydration (01/28/2011)   AssessContinue with iv fluids. Diarrhea (01/28/2011) Improving   Assessment: Intermittent,no significant abnormality on flex sig.   Plan: Stool studies pending. Hypotension (01/28/2011)   Assessment: resolved.   Hypokalemia:    Assesment: secondary to diarrhea.     Patient unable to tolerate Po Potassium, so started on IV Potassium.    Recheck potassium levels in am. Dyspnea: Resolved. CXR shows small bilateral pleural effusions. Sec to fluid overload. IV fluids rate has been changed to  50/hr. DVT Prophylaxis: Heparin. Leg Tenderness: Dopplers of lower extremities done, results are negative.    LOS: 8 days   LAMA,GAGAN S 02/04/2011, 3:04 PM

## 2011-02-04 NOTE — Progress Notes (Signed)
  Small bowel follow through shows high grade partial SBO.  I discussed this in detail with Priscilla [name].  I recommend exploratory laparotomy tomorrow to lyse adhesions which I suspect is the cause.  I don't malignancy. I discussed the risks of surgery with her in detail.  Plan OR tomorrow.

## 2011-02-04 NOTE — Progress Notes (Signed)
02/04/2011 [name] Case Management Note [phone]       77yo female patient admitted 01/27/11 with c/o nausea, vomitting, and diarrhea. Pt has been diagnosed with small bowel obstruction. Prior to admission, patient lived at home with support from children and was independent with ADLs. Anticipated plan will be for patient to return home with home health services. CM will continue to monitor.

## 2011-02-04 NOTE — Progress Notes (Signed)
INITIAL ADULT NUTRITION ASSESSMENT Date: 02/04/2011   Time: 1:39 PM  Reason for Assessment: NPO/Clear liquids x 7 days  ASSESSMENT: Female 77 y.o.  Dx: Enteritis  Hx:  Past Medical History  Diagnosis Date  . Hypertension   . Chest pain, atypical   . OA (osteoarthritis)   . IDA (iron deficiency anemia)   . Heart murmur   . Deep vein thrombosis   . Osteoarthritis   . Cancer     colon, rectal    Related Meds:     . aspirin  81 mg Oral Daily  . tenormin  50 mg Oral BID  . ciprofloxacin  400 mg Intravenous Q12H  . famotidine (PEPCID) IV  20 mg Intravenous Q12H  . heparin subcutaneous  5,000 Units Subcutaneous Q8H  .  HYDROmorphone (DILAUDID) injection  0.5 mg Intravenous Once  . sodium chloride  10 mL Intracatheter Q12H     Ht: 5\' 2"  (157.5 cm)  Wt: 160 lb (72.576 kg) (bed scale)  Ideal Wt: 50.1 kg  % Ideal Wt: 145%   Usual Wt:  % Usual Wt:   Body mass index is 29.26 kg/(m^2).  Food/Nutrition Related Hx: Patient admitted with nausea, vomiting, and diarrhea. History of colon CA with partial colectomy.  Patient with new partial bowel obstruction.   Labs: Sodium 137 mEq/L Potassium 3.0 mEq/L L Chloride 107 mEq/L CO2 21 mEq/L Glucose, Bld 88 mg/dL BUN 10 mg/dL Creatinine, Ser 0.98 mg/dL Calcium 7.3 mg/dL L Total Protein 4.8 g/dL L Albumin 2.1 g/dL L  I/O last 3 completed shifts: In: 2190 [P.O.:820; I.V.:645; Other:25; IV Piggyback:700] Out: 8 [Urine:4; Stool:4] Total I/O In: 100 [P.O.:100] Out: -   Diet Order: NPO  Supplements/Tube Feeding:  IVF:    sodium chloride Last Rate: 50 mL/hr at 02/03/11 1800    Estimated Nutritional Needs:   Kcal: 1400-1500 kcal Protein: 87-102 g Fluid: 2200 mL  NUTRITION DIAGNOSIS: -Inadequate oral intake (NI-2.1).  Status: Ongoing  RELATED TO: partial bowel obstruction  AS EVIDENCE BY: NPO status  MONITORING/EVALUATION(Goals): Patient will tolerate diet advance to meet 90-100% estimated needs.  EDUCATION  NEEDS: -Education not appropriate at this time  INTERVENTION: - Advance diet as medically able. Will monitor advancement and tolerance and add interventions as needed.  - If unable to advance diet within the next 24-48 hours, consider short term TPN to improve nutrition status.   Dietitian [phone]  DOCUMENTATION CODES Per approved criteria  -Not Applicable    Linnell Fulling [facility] 02/04/2011, 1:39 PM

## 2011-02-05 ENCOUNTER — Encounter (HOSPITAL_COMMUNITY): Payer: Self-pay | Admitting: Anesthesiology

## 2011-02-05 ENCOUNTER — Inpatient Hospital Stay (HOSPITAL_COMMUNITY): Payer: Medicare Other

## 2011-02-05 ENCOUNTER — Inpatient Hospital Stay (HOSPITAL_COMMUNITY): Payer: Medicare Other | Admitting: Anesthesiology

## 2011-02-05 ENCOUNTER — Encounter (HOSPITAL_COMMUNITY): Admission: EM | Disposition: A | Discharge status: Home Health | Payer: Self-pay | Source: Home / Self Care

## 2011-02-05 ENCOUNTER — Other Ambulatory Visit (INDEPENDENT_AMBULATORY_CARE_PROVIDER_SITE_OTHER): Payer: Self-pay | Admitting: Surgery

## 2011-02-05 DIAGNOSIS — K56609 Unspecified intestinal obstruction, unspecified as to partial versus complete obstruction: Secondary | ICD-10-CM

## 2011-02-05 HISTORY — PX: BOWEL RESECTION: SHX1257

## 2011-02-05 HISTORY — DX: Unspecified intestinal obstruction, unspecified as to partial versus complete obstruction: K56.609

## 2011-02-05 HISTORY — PX: LAPAROTOMY: SHX154

## 2011-02-05 LAB — CBC
HCT: 29.4 % — ABNORMAL LOW (ref 36.0–46.0)
Hemoglobin: 9.1 g/dL — ABNORMAL LOW (ref 12.0–15.0)
Hemoglobin: 9.9 g/dL — ABNORMAL LOW (ref 12.0–15.0)
MCH: 29.9 pg (ref 26.0–34.0)
MCHC: 34.3 g/dL (ref 30.0–36.0)
MCV: 88.3 fL (ref 78.0–100.0)
RBC: 3.33 MIL/uL — ABNORMAL LOW (ref 3.87–5.11)
RDW: 13.8 % (ref 11.5–15.5)
WBC: 23.8 10*3/uL — ABNORMAL HIGH (ref 4.0–10.5)

## 2011-02-05 LAB — COMPREHENSIVE METABOLIC PANEL
ALT: 9 U/L (ref 0–35)
Calcium: 7.6 mg/dL — ABNORMAL LOW (ref 8.4–10.5)
GFR calc Af Amer: >90 mL/min (ref >90)
Glucose, Bld: 81 mg/dL (ref 70–99)
Sodium: 138 mEq/L (ref 135–145)
Total Protein: 4.9 g/dL — ABNORMAL LOW (ref 6.0–8.3)

## 2011-02-05 LAB — GLUCOSE, CAPILLARY: Glucose-Capillary: 94 mg/dL (ref 70–99)

## 2011-02-05 LAB — CREATININE, SERUM
GFR calc Af Amer: 50 mL/min — ABNORMAL LOW (ref >90)
GFR calc non Af Amer: 43 mL/min — ABNORMAL LOW (ref >90)

## 2011-02-05 LAB — PREPARE RBC (CROSSMATCH)

## 2011-02-05 LAB — POCT I-STAT 4, (NA,K, GLUC, HGB,HCT)
Glucose, Bld: 89 mg/dL (ref 70–99)
HCT: 25 % — ABNORMAL LOW (ref 36.0–46.0)

## 2011-02-05 SURGERY — LAPAROTOMY, EXPLORATORY
Anesthesia: General | Site: Abdomen | Wound class: Clean Contaminated

## 2011-02-05 MED ORDER — SODIUM CHLORIDE 0.9 % IJ SOLN: 9.0000 mL | INTRAMUSCULAR | Status: DC | PRN

## 2011-02-05 MED ORDER — MEPERIDINE HCL 25 MG/ML IJ SOLN: 6.2500 mg | INTRAMUSCULAR | Status: DC | PRN

## 2011-02-05 MED ORDER — GLYCOPYRROLATE 0.2 MG/ML IJ SOLN
INTRAMUSCULAR | Status: DC | PRN
  Administered 2011-02-05: .6 mg via INTRAVENOUS

## 2011-02-05 MED ORDER — SUFENTANIL CITRATE 50 MCG/ML IV SOLN
INTRAVENOUS | Status: DC | PRN
Start: 2011-02-05 — End: 2011-02-05
  Administered 2011-02-05: 5 ug via INTRAVENOUS
  Administered 2011-02-05 (×2): 10 ug via INTRAVENOUS
  Administered 2011-02-05: 5 ug via INTRAVENOUS
  Administered 2011-02-05: 10 ug via INTRAVENOUS

## 2011-02-05 MED ORDER — SODIUM CHLORIDE 0.9 % IJ SOLN
INTRAMUSCULAR | Status: AC
  Administered 2011-02-07: 10 mL
  Filled 2011-02-05: qty 12

## 2011-02-05 MED ORDER — SUCCINYLCHOLINE CHLORIDE 20 MG/ML IJ SOLN
INTRAMUSCULAR | Status: DC | PRN
  Administered 2011-02-05: 100 mg via INTRAVENOUS

## 2011-02-05 MED ORDER — ROCURONIUM BROMIDE 100 MG/10ML IV SOLN
INTRAVENOUS | Status: DC | PRN
  Administered 2011-02-05: 5 mg via INTRAVENOUS
  Administered 2011-02-05: 10 mg via INTRAVENOUS
  Administered 2011-02-05: 30 mg via INTRAVENOUS
  Administered 2011-02-05: 5 mg via INTRAVENOUS

## 2011-02-05 MED ORDER — ONDANSETRON HCL 4 MG/2ML IJ SOLN
4.0000 mg | Freq: Four times a day (QID) | INTRAMUSCULAR | Status: DC | PRN
  Administered 2011-02-10 – 2011-02-20 (×7): 4 mg via INTRAVENOUS
  Filled 2011-02-05 (×7): qty 2

## 2011-02-05 MED ORDER — SODIUM CHLORIDE 0.9 % IR SOLN
Status: DC | PRN
  Administered 2011-02-05: 5000 mL

## 2011-02-05 MED ORDER — DIPHENHYDRAMINE HCL 12.5 MG/5ML PO ELIX
12.5000 mg | ORAL_SOLUTION | Freq: Four times a day (QID) | ORAL | Status: DC | PRN
  Filled 2011-02-05: qty 5

## 2011-02-05 MED ORDER — KCL IN DEXTROSE-NACL 20-5-0.45 MEQ/L-%-% IV SOLN
INTRAVENOUS | Status: DC
  Administered 2011-02-06 (×2): via INTRAVENOUS
  Administered 2011-02-10: 20 mL/h via INTRAVENOUS
  Filled 2011-02-05 (×8): qty 1000

## 2011-02-05 MED ORDER — HYDROMORPHONE 0.3 MG/ML IV SOLN
INTRAVENOUS | Status: AC
  Administered 2011-02-05: 7.5 mg via INTRAVENOUS
  Filled 2011-02-05: qty 25

## 2011-02-05 MED ORDER — ONDANSETRON HCL 4 MG PO TABS
4.0000 mg | ORAL_TABLET | ORAL | Status: DC | PRN
  Administered 2011-02-18: 4 mg via ORAL
  Filled 2011-02-05 (×2): qty 1

## 2011-02-05 MED ORDER — HYDROMORPHONE HCL PF 1 MG/ML IJ SOLN
0.2500 mg | INTRAMUSCULAR | Status: DC | PRN
  Administered 2011-02-05 (×4): 0.5 mg via INTRAVENOUS

## 2011-02-05 MED ORDER — PROPOFOL 10 MG/ML IV EMUL
INTRAVENOUS | Status: DC | PRN
  Administered 2011-02-05: 140 mg via INTRAVENOUS

## 2011-02-05 MED ORDER — ONDANSETRON HCL 4 MG/2ML IJ SOLN
INTRAMUSCULAR | Status: DC | PRN
  Administered 2011-02-05: 4 mg via INTRAVENOUS

## 2011-02-05 MED ORDER — HYDROMORPHONE 0.3 MG/ML IV SOLN
INTRAVENOUS | Status: DC
  Administered 2011-02-05: 2.79 mg via INTRAVENOUS
  Administered 2011-02-06: 0.799 mg via INTRAVENOUS
  Administered 2011-02-06: 11.53 mL via INTRAVENOUS
  Administered 2011-02-06: 0.179 mg via INTRAVENOUS
  Administered 2011-02-06: 7.88 mL via INTRAVENOUS
  Administered 2011-02-06: 7.5 mg via INTRAVENOUS
  Administered 2011-02-06: 3.99 mL via INTRAVENOUS
  Administered 2011-02-07: 0.999 mg via INTRAVENOUS
  Administered 2011-02-07: 1.79 mg via INTRAVENOUS
  Administered 2011-02-07: 1.99 mL via INTRAVENOUS
  Administered 2011-02-07: 3.98 mg via INTRAVENOUS
  Administered 2011-02-07: 7.5 mg via INTRAVENOUS
  Administered 2011-02-07: 0.3 mg via INTRAVENOUS
  Administered 2011-02-08: 2.19 mg via INTRAVENOUS
  Administered 2011-02-08: 15 mg via INTRAVENOUS
  Administered 2011-02-08: 3.33 mL via INTRAVENOUS
  Administered 2011-02-08: 0.2 mg via INTRAVENOUS
  Administered 2011-02-08: 3.33 mL via INTRAVENOUS
  Administered 2011-02-08: 0.4 mg via INTRAVENOUS
  Administered 2011-02-08: 6.67 mL via INTRAVENOUS
  Administered 2011-02-09: 8 mL via INTRAVENOUS
  Administered 2011-02-09: 1.3 mL via INTRAVENOUS
  Administered 2011-02-09: 4.37 mL via INTRAVENOUS
  Administered 2011-02-09: 3.33 mL via INTRAVENOUS
  Administered 2011-02-09: 2.6 mL via INTRAVENOUS
  Administered 2011-02-09: 0.6 mL via INTRAVENOUS
  Administered 2011-02-10: 2.19 mg via INTRAVENOUS
  Administered 2011-02-10: 7.5 mg via INTRAVENOUS
  Administered 2011-02-10: 0.3 mL via INTRAVENOUS
  Administered 2011-02-10: 0.59 mg via INTRAVENOUS
  Administered 2011-02-10: 0.9 mg via INTRAVENOUS
  Administered 2011-02-10: 0.599 mL via INTRAVENOUS
  Administered 2011-02-11: 0.4 mg via INTRAVENOUS
  Administered 2011-02-11: 0.399 mg via INTRAVENOUS
  Administered 2011-02-11: 0.6 mg via INTRAVENOUS
  Administered 2011-02-11: 0.2 mg via INTRAVENOUS
  Administered 2011-02-11: 0.599 mg via INTRAVENOUS
  Administered 2011-02-12: 0.6 mg via INTRAVENOUS
  Administered 2011-02-12: 7.5 mg via INTRAVENOUS
  Administered 2011-02-12: 0.4 mg via INTRAVENOUS
  Administered 2011-02-12: 0.7 mg via INTRAVENOUS
  Administered 2011-02-13 (×2): 0.4 mg via INTRAVENOUS
  Administered 2011-02-14: 0.399 mg via INTRAVENOUS
  Filled 2011-02-05 (×8): qty 25

## 2011-02-05 MED ORDER — PROMETHAZINE HCL 25 MG/ML IJ SOLN: 6.2500 mg | INTRAMUSCULAR | Status: DC | PRN

## 2011-02-05 MED ORDER — NALOXONE HCL 0.4 MG/ML IJ SOLN: 0.4000 mg | INTRAMUSCULAR | Status: DC | PRN

## 2011-02-05 MED ORDER — HEPARIN SODIUM (PORCINE) 5000 UNIT/ML IJ SOLN
5000.0000 [IU] | Freq: Three times a day (TID) | INTRAMUSCULAR | Status: DC
  Administered 2011-02-07 – 2011-02-23 (×48): 5000 [IU] via SUBCUTANEOUS
  Filled 2011-02-05 (×51): qty 1

## 2011-02-05 MED ORDER — MIDAZOLAM HCL 5 MG/5ML IJ SOLN
INTRAMUSCULAR | Status: DC | PRN
  Administered 2011-02-05: 2 mg via INTRAVENOUS

## 2011-02-05 MED ORDER — HYDROMORPHONE HCL PF 1 MG/ML IJ SOLN
INTRAMUSCULAR | Status: AC
  Filled 2011-02-05: qty 1

## 2011-02-05 MED ORDER — DIPHENHYDRAMINE HCL 50 MG/ML IJ SOLN
12.5000 mg | Freq: Four times a day (QID) | INTRAMUSCULAR | Status: DC | PRN
  Administered 2011-02-08 – 2011-02-10 (×3): 12.5 mg via INTRAVENOUS
  Filled 2011-02-05 (×4): qty 1

## 2011-02-05 MED ORDER — LACTATED RINGERS IV SOLN
INTRAVENOUS | Status: DC
  Administered 2011-02-05 (×2): via INTRAVENOUS

## 2011-02-05 MED ORDER — NEOSTIGMINE METHYLSULFATE 1 MG/ML IJ SOLN
INTRAMUSCULAR | Status: DC | PRN
  Administered 2011-02-05: 5 mg via INTRAVENOUS

## 2011-02-05 MED ORDER — SODIUM CHLORIDE 0.9 % IV SOLN
1.0000 g | INTRAVENOUS | Status: DC
  Administered 2011-02-06 – 2011-02-14 (×9): 1 g via INTRAVENOUS
  Filled 2011-02-05 (×9): qty 1

## 2011-02-05 SURGICAL SUPPLY — 51 items
BLADE SURG ROTATE 9660 (MISCELLANEOUS) IMPLANT
CANISTER SUCTION 2500CC (MISCELLANEOUS) ×2 IMPLANT
CLOTH BEACON ORANGE TIMEOUT ST (SAFETY) ×2 IMPLANT
COVER SURGICAL LIGHT HANDLE (MISCELLANEOUS) ×2 IMPLANT
DRAIN CHANNEL 19F RND (DRAIN) ×2 IMPLANT
DRAPE LAPAROSCOPIC ABDOMINAL (DRAPES) ×2 IMPLANT
DRAPE WARM FLUID 44X44 (DRAPE) ×2 IMPLANT
DRESSING TELFA 8X3 (GAUZE/BANDAGES/DRESSINGS) ×2 IMPLANT
ELECT BLADE 4.0 EZ CLEAN MEGAD (MISCELLANEOUS) ×2
ELECT REM PT RETURN 9FT ADLT (ELECTROSURGICAL) ×2
ELECTRODE BLDE 4.0 EZ CLN MEGD (MISCELLANEOUS) ×1 IMPLANT
ELECTRODE REM PT RTRN 9FT ADLT (ELECTROSURGICAL) ×1 IMPLANT
EVACUATOR SILICONE 100CC (DRAIN) ×2 IMPLANT
GLOVE BIO SURGEON STRL SZ7.5 (GLOVE) ×2 IMPLANT
GLOVE BIOGEL PI IND STRL 7.5 (GLOVE) ×3 IMPLANT
GLOVE BIOGEL PI INDICATOR 7.5 (GLOVE) ×3
GLOVE ECLIPSE 7.0 STRL STRAW (GLOVE) ×6 IMPLANT
GLOVE EUDERMIC 7 POWDERFREE (GLOVE) ×6 IMPLANT
GLOVE SURG SIGNA 7.5 PF LTX (GLOVE) ×4 IMPLANT
GOWN PREVENTION PLUS XLARGE (GOWN DISPOSABLE) ×2 IMPLANT
GOWN STRL NON-REIN LRG LVL3 (GOWN DISPOSABLE) ×8 IMPLANT
KIT BASIN OR (CUSTOM PROCEDURE TRAY) ×2 IMPLANT
KIT ROOM TURNOVER OR (KITS) ×2 IMPLANT
LIGASURE IMPACT 36 18CM CVD LR (INSTRUMENTS) ×2 IMPLANT
NS IRRIG 1000ML POUR BTL (IV SOLUTION) ×12 IMPLANT
PACK GENERAL/GYN (CUSTOM PROCEDURE TRAY) ×2 IMPLANT
PAD ARMBOARD 7.5X6 YLW CONV (MISCELLANEOUS) ×2 IMPLANT
RELOAD PROXIMATE 75MM BLUE (ENDOMECHANICALS) ×4 IMPLANT
RELOAD PROXIMATE TA60MM BLUE (ENDOMECHANICALS) ×2 IMPLANT
SPECIMEN JAR LARGE (MISCELLANEOUS) IMPLANT
SPONGE GAUZE 4X4 12PLY (GAUZE/BANDAGES/DRESSINGS) ×2 IMPLANT
SPONGE LAP 18X18 X RAY DECT (DISPOSABLE) ×6 IMPLANT
STAPLER GUN LINEAR PROX 60 (STAPLE) ×2 IMPLANT
STAPLER PROXIMATE 75MM BLUE (STAPLE) ×2 IMPLANT
STAPLER VISISTAT 35W (STAPLE) ×2 IMPLANT
SUCTION POOLE TIP (SUCTIONS) ×2 IMPLANT
SUT ETHILON 2 0 FS 18 (SUTURE) ×2 IMPLANT
SUT PDS AB 1 TP1 96 (SUTURE) ×4 IMPLANT
SUT SILK 2 0 SH CR/8 (SUTURE) ×4 IMPLANT
SUT SILK 2 0 TIES 10X30 (SUTURE) ×2 IMPLANT
SUT SILK 3 0 SH CR/8 (SUTURE) ×4 IMPLANT
SUT SILK 3 0 TIES 10X30 (SUTURE) ×2 IMPLANT
SUT VIC AB 3-0 SH 18 (SUTURE) IMPLANT
SUT VIC AB 3-0 SH 27 (SUTURE) ×1
SUT VIC AB 3-0 SH 27XBRD (SUTURE) ×1 IMPLANT
TAPE CLOTH SURG 4X10 WHT LF (GAUZE/BANDAGES/DRESSINGS) ×2 IMPLANT
TOWEL OR 17X24 6PK STRL BLUE (TOWEL DISPOSABLE) ×2 IMPLANT
TOWEL OR 17X26 10 PK STRL BLUE (TOWEL DISPOSABLE) ×2 IMPLANT
TRAY FOLEY CATH 14FRSI W/METER (CATHETERS) IMPLANT
WATER STERILE IRR 1000ML POUR (IV SOLUTION) ×2 IMPLANT
YANKAUER SUCT BULB TIP NO VENT (SUCTIONS) ×2 IMPLANT

## 2011-02-05 NOTE — Op Note (Signed)
01/27/2011 - 02/05/2011  2:19 PM  PATIENT:  Priscilla [name]  77 y.o. female  PRE-OPERATIVE DIAGNOSIS:  small bowel obstruction  POST-OPERATIVE DIAGNOSIS:  small bowel obstruction  PROCEDURE:  Procedure(s): EXPLORATORY LAPAROTOMY, LYSIS OF ADHESIONS SMALL BOWEL RESECTION  SURGEON:  Surgeon(s): Shelly Rubenstein, MD  PHYSICIAN ASSISTANT: Barnetta Chapel, PA  ASSISTANTS: none   ANESTHESIA:   general  EBL:  Total I/O In: 2150 [P.O.:50; I.V.:2100] Out: 700 [Urine:500; Blood:200]  BLOOD ADMINISTERED:none  DRAINS: (1) Jackson-Pratt drain(s) with closed bulb suction in the pelvis   LOCAL MEDICATIONS USED:  NONE  SPECIMEN:  Excision small bowel  DISPOSITION OF SPECIMEN:  PATHOLOGY  COUNTS:  YES  TOURNIQUET:  * No tourniquets in log *  DICTATION: .Dragon Dictation The patient was brought to the operating room and identified as the correct patient. She was placed supine on the operating room table and general anesthesia was induced. The patient's abdomen was then prepped and draped in the usual sterile fashion. A scalpel then used to create a midline incision to the patient's previous scar. I did this from above the umbilicus down to the pelvis. I then took incision down to the fascia with electrocautery. The peritoneum wasn't opened the entire length of incision. Upon entering the abdomen, the patient was that of extensive adhesions. The omentum and small bowel were stuck to the abdominal wall and peritoneal surfaces. Extensive lysis of adhesions for over 2 hours was undertaken. The patient was found to have small bowel stuck down into the pelvis extending to the patient's previous low anterior resection anastomosis. Multiple enterotomies were created trying to free up the small bowel from deep in the pelvis. Once I probably freed loops of bowel out of the pelvis I liver followed as distally to the ileocolic anastomosis. Again the patient also had a previous right hemicolectomy. During  dissection a hard nodules on the peritoneum in the right upper quadrant. I excised this nodule completely and sent to pathology for evaluation. It appeared to be calcified fat. I saw no other evidence of recurrent cancer in her abdominal cavity. At this point once I was able to free up all loops of small intestines it became apparent that a significant resection be necessary secondary to the multiple enterotomies. I transected the small bowel proximal and distal to the enterotomies the GIA-75 stapler. An incidental mesentery with the LigaSure. Several areas had to be sutured with silk sutures. A separate enterotomy had to be repaired also with interrupted silk sutures. I then reapproximated the small and the small bowel together with interrupted silk sutures. I then created the enterotomies and performed a side-to-side anastomosis with a single firing of the GIA stapler. I then closed the opening with 2 separate firings of the TA 60 stapler. An inverted entire staple line with interrupted silk sutures. A large anastomosis which appeared viable here appeared to be achieved. An close the mesenteric defect with a running silk suture. During the dissection the bladder seemed to dissect away from the peritoneum so I tacked this back in place with silk sutures. At no point did the bladder peritoneum entered. The abdomen was then grossly irrigated with several liters of normal saline. A medicine for skin incision and placed a 19 [place] Blake drain to the pelvis. This was secured in place with nylon suture. Once he was aspirated achieved I closed the midline fascia with a running #1 looped PDS suture. The skin was then closed with skin staples. I placed Telfa wicks between  the staples. Gauze and tape were then applied. The patient tolerated the procedure well. All counts were correct at the end of the procedure. The patient then exited the operating room and taken in stable condition to the recovery room. PLAN OF CARE:  Admit to inpatient   PATIENT DISPOSITION:  PACU - hemodynamically stable.   Delay start of Pharmacological VTE agent (>24hrs) due to surgical blood loss or risk of bleeding:  {YES/NO/NOT APPLICABLE:20182

## 2011-02-05 NOTE — Anesthesia Preprocedure Evaluation (Addendum)
Anesthesia Evaluation  Patient identified by MRN, date of birth, ID band Patient awake    Reviewed: Allergy & Precautions, H&P , NPO status , Patient's Chart, lab work & pertinent test results, reviewed documented beta blocker date and time   Airway Mallampati: II TM Distance: >3 FB Neck ROM: full    Dental No notable dental hx. (+) Dental Advisory Given   Pulmonary neg pulmonary ROS,  clear to auscultation  Pulmonary exam normal       Cardiovascular Exercise Tolerance: Good hypertension, Pt. on medications and Pt. on home beta blockers neg cardio ROS regular Normal Pt reports stress test approx 2010 with "good results". Pt reports Aortic valve regurgitation found by auscultationof murmur. Pt reports to be asymptomic.   Neuro/Psych Negative Neurological ROS  Negative Psych ROS   GI/Hepatic negative GI ROS, Neg liver ROS,   Endo/Other  Negative Endocrine ROS  Renal/GU negative Renal ROS  Genitourinary negative   Musculoskeletal negative musculoskeletal ROS (+)   Abdominal   Peds negative pediatric ROS (+)  Hematology negative hematology ROS (+)   Anesthesia Other Findings   Reproductive/Obstetrics negative OB ROS                         Anesthesia Physical Anesthesia Plan  ASA: III  Anesthesia Plan: General   Post-op Pain Management:    Induction: Intravenous, Rapid sequence and Cricoid pressure planned  Airway Management Planned: Oral ETT  Additional Equipment:   Intra-op Plan:   Post-operative Plan: Extubation in OR  Informed Consent: I have reviewed the patients History and Physical, chart, labs and discussed the procedure including the risks, benefits and alternatives for the proposed anesthesia with the patient or authorized representative who has indicated his/her understanding and acceptance.     Plan Discussed with: CRNA  Anesthesia Plan Comments:          Anesthesia Quick Evaluation

## 2011-02-05 NOTE — Progress Notes (Signed)
02/05/2011 Alliana Mcauliff [facility] Case Management Note [phone]  Utilization review completed.  

## 2011-02-05 NOTE — Progress Notes (Signed)
She still feels bloated. Plan Exp Laparotomy today.   Again, risks discussed with patient.  Likelihood of success is good.

## 2011-02-05 NOTE — Anesthesia Postprocedure Evaluation (Signed)
  Anesthesia Post-op Note  Patient: Priscilla [name]  Procedure(s) Performed:  EXPLORATORY LAPAROTOMY - lysis of adhesions.; SMALL BOWEL RESECTION  Patient Location: PACU  Anesthesia Type: General  Level of Consciousness: awake and sedated  Airway and Oxygen Therapy: Patient Spontanous Breathing and Patient connected to face mask oxygen  Post-op Pain: moderate  Post-op Assessment: Post-op Vital signs reviewed, Patient's Cardiovascular Status Stable and Respiratory Function Stable  Post-op Vital Signs: stable  Complications: No apparent anesthesia complications

## 2011-02-05 NOTE — Transfer of Care (Signed)
Immediate Anesthesia Transfer of Care Note  Patient: Priscilla [name]  Procedure(s) Performed:  EXPLORATORY LAPAROTOMY - lysis of adhesions.; SMALL BOWEL RESECTION  Patient Location: PACU  Anesthesia Type: General  Level of Consciousness: awake  Airway & Oxygen Therapy: Patient Spontanous Breathing and Patient connected to face mask oxygen  Post-op Assessment: Report given to PACU RN, Post -op Vital signs reviewed and stable and Patient moving all extremities  Post vital signs: stable  Complications: No apparent anesthesia complications

## 2011-02-05 NOTE — Progress Notes (Signed)
Subjective: Saw the patient postoperatively.  Was complaining of abdominal pain.  Objective: Vital signs in last 24 hours: Filed Vitals:   02/05/11 1500 02/05/11 1506 02/05/11 1515 02/05/11 1609  BP:  100/48    Pulse: 89  89   Temp:      TempSrc:      Resp: 21  21 13   Height:      Weight:      SpO2: 100%  100% 100%   Weight change:   Intake/Output Summary (Last 24 hours) at 02/05/11 1659 Last data filed at 02/05/11 1500  Gross per 24 hour  Intake   3410 ml  Output    848 ml  Net   2562 ml   Physical Exam: General: Awake, in some distress from abdominal pain. HEENT: EOMI. Neck: Supple CV: S1 and S2 Lungs: Clear to ascultation bilaterally Abdomen: Soft, generalized tenderness, Nondistended, +bowel sounds. Ext: Good pulses. Trace edema.  Lab Results:  Basename 02/05/11 1321 02/05/11 0536 02/03/11 0655  NA 137 138 --  K 3.3* 3.1* --  CL -- 105 107  CO2 -- 24 21  GLUCOSE 89 81 --  BUN -- 4* 10  CREATININE -- 0.59 0.65  CALCIUM -- 7.6* 7.3*  MG -- -- --  PHOS -- -- --    Basename 02/05/11 0536 02/03/11 0655  AST 12 13  ALT 9 9  ALKPHOS 88 101  BILITOT 0.3 0.3  PROT 4.9* 4.8*  ALBUMIN 2.2* 2.1*    Basename 02/03/11 0655  LIPASE 122*  AMYLASE --    Basename 02/05/11 1321 02/05/11 0536 02/03/11 0655  WBC -- 6.6 7.5  NEUTROABS -- -- --  HGB 8.5* 9.1* --  HCT 25.0* 26.5* --  MCV -- 87.2 86.3  PLT -- 262 227   No results found for this basename: CKTOTAL:3,CKMB:3,CKMBINDEX:3,TROPONINI:3 in the last 72 hours No results found for this basename: POCBNP:3 in the last 72 hours No results found for this basename: DDIMER:2 in the last 72 hours No results found for this basename: HGBA1C:2 in the last 72 hours No results found for this basename: CHOL:2,HDL:2,LDLCALC:2,TRIG:2,CHOLHDL:2,LDLDIRECT:2 in the last 72 hours No results found for this basename: TSH,T4TOTAL,FREET3,T3FREE,THYROIDAB in the last 72 hours No results found for this basename:  VITAMINB12:2,FOLATE:2,FERRITIN:2,TIBC:2,IRON:2,RETICCTPCT:2 in the last 72 hours  Micro Results: Recent Results (from the past 240 hour(s))  STOOL CULTURE     Status: Normal   Collection Time   01/28/11 12:15 AM      Component Value Range Status Comment   Specimen Description STOOL   Final    Special Requests Normal   Final    Culture     Final    Value: NO SALMONELLA, SHIGELLA, CAMPYLOBACTER, OR YERSINIA ISOLATED   Report Status 01/31/2011 FINAL   Final   CLOSTRIDIUM DIFFICILE BY PCR     Status: Normal   Collection Time   01/28/11 12:15 AM      Component Value Range Status Comment   C difficile by pcr NEGATIVE  NEGATIVE  Final   MRSA PCR SCREENING     Status: Normal   Collection Time   01/28/11  2:36 AM      Component Value Range Status Comment   MRSA by PCR NEGATIVE  NEGATIVE  Final     Studies/Results: Dg Chest 2 View  02/02/2011  *RADIOLOGY REPORT*  Clinical Data: Shortness of breath.  CHEST - 2 VIEW  Comparison: 04/16/2010  Findings: There are new small bilateral pleural effusions.  The lungs are  otherwise clear.  Borderline cardiomegaly.  Vascularity is normal.  No acute osseous abnormality.  IMPRESSION: Small bilateral pleural effusions.  Original Report Authenticated By: Gwynn Burly, M.D.   Ct Abdomen Pelvis W Contrast  01/26/2011  *RADIOLOGY REPORT*  Clinical Data: Diarrhea, vomiting.  CT ABDOMEN AND PELVIS WITH CONTRAST  Technique:  Multidetector CT imaging of the abdomen and pelvis was performed following the standard protocol during bolus administration of intravenous contrast.  Contrast: OMNIPAQUE IOHEXOL 300 MG/ML IV SOLN  Comparison: 12/24/2009  Findings: Minimal left lung base scarring versus atelectasis. Heart size upper normal limits to mildly enlarged.  No pericardial or pleural effusion.  Small hiatal hernia.  Low attenuation of the liver suggests fatty infiltration. Unremarkable biliary system, spleen, pancreas, adrenal glands. Symmetric renal enhancement.  Mild ureteral fullness on the right is similar to comparison.  No obstructing lesion identified.  No bowel obstruction. Small bowel anastomotic suture noted in the right lower quadrant anteriorly.    There is a segment of small bowel with mild wall thickening / mucosal hyperenhancement (image 64 series 2 for example).  Evidence of previous low anterior resection with no radiographic evidence for colitis. Evidence of an anastomotic suture in the right colon as well.  There are a couple small lymph nodes in this region which are nonspecific.  A peripherally calcified centrally fat attenuation focus within the right hemiabdomen is without interval change.  There is scattered atherosclerotic calcification of the aorta and its branches. No aneurysmal dilatation.  Thin-walled bladder.  Absent uterus.  Pelvic floor laxity.  Multilevel degenerative changes of the imaged spine. No acute or aggressive appearing osseous lesion.  IMPRESSION: Status post partial right colectomy and low anterior resection.  No bowel obstruction however, there is a short segment small bowel which is mildly inflamed appearing as can be seen with a nonspecific enteritis.  There are a couple mildly prominent lymph nodes in the right lower quadrant which may be reactive.  Attention on follow-up.  Original Report Authenticated By: Waneta Martins, M.D.   Dg Small Bowel  02/04/2011  *RADIOLOGY REPORT*  Clinical Data:  Evaluate partial small bowel obstruction  SMALL BOWEL SERIES  Technique:  Following ingestion of a mixture of thin barium and Entero Vu, serial small bowel images were obtained including spot views of the terminal ileum.  Fluoroscopy time:  2.26 minutes.  Comparison:  02/02/2011  Findings:  The scout radiograph demonstrates abnormal small bowel dilatation within the left upper quadrant of the abdomen.  Gas and stool is noted within the colon.  The  patient ingested two cups of barium and delayed overhead imaging of the abdomen  pelvis was obtained at approximately 20- minute intervals. After 1 hour and 15 minutes there is radiotracer accumulation within the distal small bowel and proximal colon.  The proximal small bowel loops are abnormally dilated measuring up to 6.3 cm.  The ileum appears collapsed.  Transition point is in the expected location of the distal jejunum or proximal ileum.  Due to overlap of bowel structures the exact location of the transition point cannot be located.  IMPRESSION:  1.  High-grade partial obstruction with transition point in the distal jejunum or proximal ileum.  Original Report Authenticated By: Rosealee Albee, M.D.   Dg Chest Port 1 View  02/05/2011  *RADIOLOGY REPORT*  Clinical Data: Central line placement  PORTABLE CHEST - 1 VIEW  Comparison: Chest x-ray of 02/02/2011  Findings: Both the right upper extremity central venous line and the  right IJ central venous line overlap with the tips in the region of the lower SVC just above the expected right atrial junction.  No pneumothorax is seen.  Mild basilar atelectasis is noted.  There is cardiomegaly present.  An NG tube is present.  IMPRESSION: Both the right IJ and right upper extremity central venous lines overlap with the tips in the lower SVC.  No pneumothorax.  Original Report Authenticated By: Juline Patch, M.D.   Dg Abd 2 Views  02/02/2011  *RADIOLOGY REPORT*  Clinical Data: Nausea and vomiting.  ABDOMEN - 2 VIEW  Comparison: CT scan dated 01/26/2011 and radiographs dated 01/26/2011  Findings: Since the prior exams the patient has developed dilatation of small bowel loops in the mid abdomen.  There is increased air in the nondistended colon.  No free air.  Small bilateral pleural effusions.  Surgical staples consistent with the bowel anastomoses are noted.  IMPRESSION: Findings consistent with new partial small bowel obstruction.  Original Report Authenticated By: Gwynn Burly, M.D.   Dg Abd Acute W/chest  01/26/2011  RADIOLOGY  REPORT*  Clinical Data: Nausea, vomiting and diarrhea.  History of colon and rectal cancer.  ACUTE ABDOMINAL SERIES  Comparison: Previous examinations.  Findings: Normal sized heart.  Clear lungs.  Mildly prominent interstitial markings.  Scattered air fluid levels in normal- caliber colon and small bowel.  No free peritoneal air.  Surgical staples in the right lower abdomen and in the lower pelvis.  Right abdominal calcification without significant change since the CT dated 12/24/2009.  This had an appearance on the CT suggesting calcified fat necrosis.  Lumbar spine degenerative changes.  Mild scoliosis.  Diffuse osteopenia.  IMPRESSION:  1.  Scattered air fluid levels in normal-caliber colon and small bowel.  This can be seen with gastroenteritis.  Mild ileus is also a possibility.  Low grade partial obstruction is less likely. 2.  Mild chronic interstitial lung disease.  Original Report Authenticated By: Darrol Angel, M.D.    Medications: I have reviewed the patient's current medications. Scheduled Meds:   . aspirin  81 mg Oral Daily  . tenormin  50 mg Oral BID  . ciprofloxacin  400 mg Intravenous Q12H  . ertapenem  1 g Intravenous 60 min Pre-Op  . ertapenem (INVANZ) IV  1 g Intravenous Q24H  . famotidine (PEPCID) IV  20 mg Intravenous Q12H  . heparin  5,000 Units Subcutaneous Q8H  . HYDROmorphone      .  HYDROmorphone (DILAUDID) injection  0.5 mg Intravenous Once  . HYDROmorphone PCA 0.3 mg/mL   Intravenous Q4H  . HYDROmorphone PCA 0.3 mg/mL   Intravenous To PACU  . potassium chloride  10 mEq Intravenous Q1 Hr x 3  . sodium chloride  10 mL Intracatheter Q12H  . sodium chloride      . DISCONTD: heparin subcutaneous  5,000 Units Subcutaneous Q8H   Continuous Infusions:   . sodium chloride 50 mL/hr at 02/04/11 1701  . dextrose 5 % and 0.45 % NaCl with KCl 20 mEq/L    . lactated ringers     PRN Meds:.acetaminophen, acetaminophen, diphenhydrAMINE, diphenhydrAMINE, diphenhydrAMINE,  HYDROmorphone, meperidine, naloxone, nitroGLYCERIN, ondansetron, ondansetron (ZOFRAN) IV, ondansetron, promethazine, promethazine, sodium chloride, sodium chloride, DISCONTD: sodium chloride irrigation  Assessment/Plan: 1. SBO (small bowel obstruction) - had surgery today with lysis of adhesion.  Appreciate assistance from surgery.  2. Dehydration.  Improved with IV hydration.  3. ARF - Creatinine at admission was 3.33.  Resolved with IV hydration.  4. Enteritis.  Currently on IV Cipro.  5. Mild hypotension.  Secondary to #2 improved.  6. Diarrhea.  May be due to #1 and #4.  7. Hypokalemia.  Replace when necessary.  Maybe due to #6.  8. Leg tenderness.  Lower extremity dopplers are negative.  9. Dyspnea.  Resolved.  As the patient's medical conditions are stable, would appreciate if general surgery would seem patient's care.    LOS: 9 days  Rakeb Kibble A, MD 02/05/2011, 4:59 PM

## 2011-02-05 NOTE — Preoperative (Signed)
Beta Blockers   Reason not to administer Beta Blockers:Pt reports taking atenolol 02-05-11 this am

## 2011-02-06 ENCOUNTER — Encounter (HOSPITAL_COMMUNITY): Payer: Self-pay | Admitting: Surgery

## 2011-02-06 ENCOUNTER — Inpatient Hospital Stay (HOSPITAL_COMMUNITY): Payer: Medicare Other

## 2011-02-06 LAB — BASIC METABOLIC PANEL
CO2: 22 mEq/L (ref 19–32)
Chloride: 105 mEq/L (ref 96–112)
GFR calc Af Amer: 35 mL/min — ABNORMAL LOW (ref >90)
Potassium: 4.2 mEq/L (ref 3.5–5.1)
Sodium: 137 mEq/L (ref 135–145)

## 2011-02-06 LAB — CBC
Hemoglobin: 8.9 g/dL — ABNORMAL LOW (ref 12.0–15.0)
MCH: 30.2 pg (ref 26.0–34.0)
Platelets: 315 10*3/uL (ref 150–400)
RBC: 2.95 MIL/uL — ABNORMAL LOW (ref 3.87–5.11)

## 2011-02-06 LAB — MAGNESIUM: Magnesium: 1.3 mg/dL — ABNORMAL LOW (ref 1.5–2.5)

## 2011-02-06 LAB — PHOSPHORUS: Phosphorus: 4.5 mg/dL (ref 2.3–4.6)

## 2011-02-06 MED ORDER — MAGNESIUM SULFATE BOLUS VIA INFUSION: 2.0000 g | Freq: Once | INTRAVENOUS | Status: DC

## 2011-02-06 MED ORDER — LORAZEPAM 2 MG/ML IJ SOLN
0.5000 mg | Freq: Four times a day (QID) | INTRAMUSCULAR | Status: DC | PRN
  Administered 2011-02-14: 0.5 mg via INTRAVENOUS
  Filled 2011-02-06 (×3): qty 1

## 2011-02-06 MED ORDER — MAGNESIUM SULFATE 40 MG/ML IJ SOLN
2.0000 g | Freq: Once | INTRAMUSCULAR | Status: AC
  Administered 2011-02-06: 2 g via INTRAVENOUS
  Filled 2011-02-06: qty 50

## 2011-02-06 MED ORDER — SODIUM CHLORIDE 0.9 % IJ SOLN
10.0000 mL | INTRAMUSCULAR | Status: DC | PRN
  Administered 2011-02-07 – 2011-02-21 (×15): 10 mL

## 2011-02-06 NOTE — Progress Notes (Signed)
Subjective: Still has sore abdomen from surgery yesterday. Feeling better today.  Objective: Vital signs in last 24 hours: Filed Vitals:   02/06/11 0400 02/06/11 0600 02/06/11 0945 02/06/11 1032  BP:  101/39 133/74 100/43  Pulse:  97 72 100  Temp:  97.9 F (36.6 C) 98.4 F (36.9 C) 98.2 F (36.8 C)  TempSrc:  Oral Oral Oral  Resp: 16 18 18 18   Height:      Weight:      SpO2: 98% 98% 98% 100%   Weight change:   Intake/Output Summary (Last 24 hours) at 02/06/11 1113 Last data filed at 02/06/11 0900  Gross per 24 hour  Intake   4016 ml  Output   1485 ml  Net   2531 ml   Physical Exam: General: Awake, NAD. HEENT: EOMI. Neck: Supple CV: S1 and S2 Lungs: Clear to ascultation bilaterally Abdomen: Soft, generalized tenderness, Nondistended, +bowel sounds. Ext: Good pulses. Trace edema.  Lab Results:  Basename 02/06/11 0535 02/05/11 2130 02/05/11 1321 02/05/11 0536  NA 137 -- 137 --  K 4.2 -- 3.3* --  CL 105 -- -- 105  CO2 22 -- -- 24  GLUCOSE 124* -- 89 --  BUN 10 -- -- 4*  CREATININE 1.57* 1.17* -- --  CALCIUM 7.7* -- -- 7.6*  MG 1.3* -- -- --  PHOS 4.5 -- -- --    Basename 02/05/11 0536  AST 12  ALT 9  ALKPHOS 88  BILITOT 0.3  PROT 4.9*  ALBUMIN 2.2*   No results found for this basename: LIPASE:2,AMYLASE:2 in the last 72 hours  Basename 02/06/11 0535 02/05/11 2130  WBC 16.6* 23.8*  NEUTROABS -- --  HGB 8.9* 9.9*  HCT 26.3* 29.4*  MCV 89.2 88.3  PLT 315 361   No results found for this basename: CKTOTAL:3,CKMB:3,CKMBINDEX:3,TROPONINI:3 in the last 72 hours No results found for this basename: POCBNP:3 in the last 72 hours No results found for this basename: DDIMER:2 in the last 72 hours No results found for this basename: HGBA1C:2 in the last 72 hours No results found for this basename: CHOL:2,HDL:2,LDLCALC:2,TRIG:2,CHOLHDL:2,LDLDIRECT:2 in the last 72 hours No results found for this basename: TSH,T4TOTAL,FREET3,T3FREE,THYROIDAB in the last 72  hours No results found for this basename: VITAMINB12:2,FOLATE:2,FERRITIN:2,TIBC:2,IRON:2,RETICCTPCT:2 in the last 72 hours  Micro Results: Recent Results (from the past 240 hour(s))  STOOL CULTURE     Status: Normal   Collection Time   01/28/11 12:15 AM      Component Value Range Status Comment   Specimen Description STOOL   Final    Special Requests Normal   Final    Culture     Final    Value: NO SALMONELLA, SHIGELLA, CAMPYLOBACTER, OR YERSINIA ISOLATED   Report Status 01/31/2011 FINAL   Final   CLOSTRIDIUM DIFFICILE BY PCR     Status: Normal   Collection Time   01/28/11 12:15 AM      Component Value Range Status Comment   C difficile by pcr NEGATIVE  NEGATIVE  Final   MRSA PCR SCREENING     Status: Normal   Collection Time   01/28/11  2:36 AM      Component Value Range Status Comment   MRSA by PCR NEGATIVE  NEGATIVE  Final     Medications: I have reviewed the patient's current medications. Scheduled Meds:    . ertapenem  1 g Intravenous 60 min Pre-Op  . ertapenem (INVANZ) IV  1 g Intravenous Q24H  . heparin  5,000 Units  Subcutaneous Q8H  . HYDROmorphone      . HYDROmorphone PCA 0.3 mg/mL   Intravenous Q4H  . HYDROmorphone PCA 0.3 mg/mL   Intravenous To PACU  . magnesium sulfate IVPB  2 g Intravenous Once  . sodium chloride      . DISCONTD: aspirin  81 mg Oral Daily  . DISCONTD: tenormin  50 mg Oral BID  . DISCONTD: ciprofloxacin  400 mg Intravenous Q12H  . DISCONTD: famotidine (PEPCID) IV  20 mg Intravenous Q12H  . DISCONTD:  HYDROmorphone (DILAUDID) injection  0.5 mg Intravenous Once  . DISCONTD: magnesium  2 g Intravenous Once  . DISCONTD: sodium chloride  10 mL Intracatheter Q12H   Continuous Infusions:    . dextrose 5 % and 0.45 % NaCl with KCl 20 mEq/L 100 mL/hr at 02/06/11 0600  . DISCONTD: sodium chloride 50 mL/hr at 02/04/11 1701  . DISCONTD: lactated ringers     PRN Meds:.diphenhydrAMINE, diphenhydrAMINE, LORazepam, naloxone, ondansetron (ZOFRAN) IV,  ondansetron, sodium chloride, DISCONTD: acetaminophen, DISCONTD: acetaminophen, DISCONTD: diphenhydrAMINE, DISCONTD: HYDROmorphone, DISCONTD: meperidine, DISCONTD: nitroGLYCERIN, DISCONTD: ondansetron, DISCONTD: promethazine, DISCONTD: promethazine, DISCONTD: sodium chloride, DISCONTD: sodium chloride irrigation  Assessment/Plan: 1. SBO (small bowel obstruction) - Surgery on 02/05/2011 with lysis of adhesion.  Appreciate assistance from surgery.  2. Dehydration.  Improved with IV hydration. Still on hydration due to hypotension.  3. ARF - Creatinine trending up again. Likely due to relative hypoperfusion from surgery yesterday. Continue to monitor. Blood pressure improved.  4. Enteritis.  Currently on ertapenem.  5. Mild hypotension.  Yesterday's episode likely due to surgery. Improved today.  6. Diarrhea.  May be due to #1 and #4. Resolved.  7. Hypokalemia.  Replace when necessary.  Maybe due to #6.  8. Leg tenderness.  Lower extremity dopplers are negative.  9. Dyspnea.  Resolved.  10. Hypomagnesemia. Replace PRN.     LOS: 10 days  Priscilla Crosby A, MD 02/06/2011, 11:13 AM

## 2011-02-06 NOTE — Progress Notes (Signed)
Agree with above.  Lanai Conlee Tabor, PT [phone]  

## 2011-02-06 NOTE — Progress Notes (Signed)
1 Day Post-Op  Subjective: Pt sore as expected. No flatus as expected. Does c/o of some mild anxiety. No CP, SOB  Objective: Vital signs in last 24 hours: Temp:  [97.3 F (36.3 C)-98.3 F (36.8 C)] 97.9 F (36.6 C) (11/15 0600) Pulse Rate:  [79-107] 97  (11/15 0600) Resp:  [9-30] 18  (11/15 0600) BP: (78-154)/(38-103) 101/39 mmHg (11/15 0600) SpO2:  [94 %-100 %] 98 % (11/15 0600) Last BM Date: 02/03/11  Intake/Output this shift: UOP dark and decreased.  Physical Exam: Lungs: CTA without w/r/r Heart: Regular Abdomen: soft, ND, appropriately tender   Incisions c/d/i. Ext: No edema or tenderness   Labs: CBC  Basename 02/06/11 0535 02/05/11 2130  WBC 16.6* 23.8*  HGB 8.9* 9.9*  HCT 26.3* 29.4*  PLT 315 361   BMET  Basename 02/06/11 0535 02/05/11 2130 02/05/11 1321 02/05/11 0536  NA 137 -- 137 --  K 4.2 -- 3.3* --  CL 105 -- -- 105  CO2 22 -- -- 24  GLUCOSE 124* -- 89 --  BUN 10 -- -- 4*  CREATININE 1.57* 1.17* -- --  CALCIUM 7.7* -- -- 7.6*   LFT  Basename 02/05/11 0536  PROT 4.9*  ALBUMIN 2.2*  AST 12  ALT 9  ALKPHOS 88  BILITOT 0.3  BILIDIR --  IBILI --  LIPASE --   PT/INR No results found for this basename: LABPROT:2,INR:2 in the last 72 hours ABG No results found for this basename: PHART:2,PCO2:2,PO2:2,HCO3:2 in the last 72 hours  Studies/Results: Dg Small Bowel  02-23-2011  *RADIOLOGY REPORT*  Clinical Data:  Evaluate partial small bowel obstruction  SMALL BOWEL SERIES  Technique:  Following ingestion of a mixture of thin barium and Entero Vu, serial small bowel images were obtained including spot views of the terminal ileum.  Fluoroscopy time:  2.26 minutes.  Comparison:  02/02/2011  Findings:  The scout radiograph demonstrates abnormal small bowel dilatation within the left upper quadrant of the abdomen.  Gas and stool is noted within the colon.  The  patient ingested two cups of barium and delayed overhead imaging of the abdomen pelvis was  obtained at approximately 20- minute intervals. After 1 hour and 15 minutes there is radiotracer accumulation within the distal small bowel and proximal colon.  The proximal small bowel loops are abnormally dilated measuring up to 6.3 cm.  The ileum appears collapsed.  Transition point is in the expected location of the distal jejunum or proximal ileum.  Due to overlap of bowel structures the exact location of the transition point cannot be located.  IMPRESSION:  1.  High-grade partial obstruction with transition point in the distal jejunum or proximal ileum.  Original Report Authenticated By: Rosealee Albee, M.D.   Dg Chest Port 1 View  02/05/2011  *RADIOLOGY REPORT*  Clinical Data: Central line placement  PORTABLE CHEST - 1 VIEW  Comparison: Chest x-ray of 02/02/2011  Findings: Both the right upper extremity central venous line and the right IJ central venous line overlap with the tips in the region of the lower SVC just above the expected right atrial junction.  No pneumothorax is seen.  Mild basilar atelectasis is noted.  There is cardiomegaly present.  An NG tube is present.  IMPRESSION: Both the right IJ and right upper extremity central venous lines overlap with the tips in the lower SVC.  No pneumothorax.  Original Report Authenticated By: Juline Patch, M.D.     Assessment: Principal Problem:  *Enteritis Active Problems:  Acute renal failure  Dehydration  Diarrhea  Hypotension  Hypokalemia  SBO (small bowel obstruction) s/p Procedure(s): EXPLORATORY LAPAROTOMY SMALL BOWEL RESECTION Plan: NPO, bowel rest with NGT 3rd spacing, will increase volume. Hypomag, replete. PT eval for activity  LOS: 10 days    Marianna Fuss 02/06/2011

## 2011-02-06 NOTE — Progress Notes (Signed)
I have seen and examined the patient and agree with the assessment and plans  

## 2011-02-06 NOTE — Progress Notes (Addendum)
Physical Therapy Evaluation Patient Details Name: Priscilla [name] MRN: [id number] DOB: January 28, 1931 Today's Date: 02/06/2011  Problem List:  Patient Active Problem List  Diagnoses  . History of valvular heart disease  . Benign hypertensive heart disease without heart failure  . Osteoarthritis  . History of colon cancer  . Rectal cancer  . Acute renal failure  . Dehydration  . Diarrhea  . Enteritis  . Hypotension  . Hypokalemia  . SBO (small bowel obstruction)    Past Medical History:  Past Medical History  Diagnosis Date  . Hypertension   . Chest pain, atypical   . OA (osteoarthritis)   . IDA (iron deficiency anemia)   . Heart murmur   . Deep vein thrombosis   . Osteoarthritis   . Cancer     colon, rectal   Past Surgical History:  Past Surgical History  Procedure Date  . Nuclear stress test 11/17/08    NORMAL  . Replacement total knee     RIGHT KNEE  . Colonoscopy 02/2010  . Ileostomy 04/08/2007  . Colon surgery 2001 and 2008  . Appendectomy   . Colectomy   . Colostomy   . Abdominal hysterectomy   . Joint replacement 2010    knee   . Laparotomy 02/05/2011    Procedure: EXPLORATORY LAPAROTOMY;  Surgeon: Shelly Rubenstein, MD;  Location: [facility] OR;  Service: General;  Laterality: N/A;  lysis of adhesions.  . Bowel resection 02/05/2011    Procedure: SMALL BOWEL RESECTION;  Surgeon: Shelly Rubenstein, MD;  Location: [facility] OR;  Service: General;  Laterality: N/A;    PT Assessment/Plan/Recommendation PT Assessment Clinical Impression Statement: Pt. has decreased activity tolerance due to prolonged bed rest.  Ambulation limited due to fatigue, but expect patient to progress quickly.   PT Recommendation/Assessment: Patient will need skilled PT in the acute care venue PT Problem List: Decreased activity tolerance;Decreased mobility;Decreased strength;Decreased knowledge of use of DME Barriers to Discharge: None PT Therapy Diagnosis : Difficulty walking;Generalized  weakness PT Plan PT Frequency: Min 3X/week PT Treatment/Interventions: DME instruction;Gait training;Stair training;Functional mobility training;Therapeutic exercise;Patient/family education PT Recommendation Follow Up Recommendations: 24 hour supervision/assistance;Home health PT Equipment Recommended: None recommended by PT PT Goals  Acute Rehab PT Goals PT Goal Formulation: With patient Time For Goal Achievement: 2 weeks Pt will Roll Supine to Left Side: Independently PT Goal: Rolling Supine to Left Side - Progress: Other (comment) Pt will go Supine/Side to Sit: Independently PT Goal: Supine/Side to Sit - Progress: Other (comment) Pt will go Sit to Supine/Side: Independently PT Goal: Sit to Supine/Side - Progress: Other (comment) Pt will Transfer Sit to Stand/Stand to Sit: with modified independence (Use of rolling walker once standing ) PT Transfer Goal: Sit to Stand/Stand to Sit - Progress: Other (comment) Pt will Ambulate: >150 feet;with least restrictive assistive device;with supervision PT Goal: Ambulate - Progress: Other (comment) Pt will Go Up / Down Stairs: 3-5 stairs;with supervision;with least restrictive assistive device PT Goal: Up/Down Stairs - Progress: Other (comment)  PT Evaluation Precautions/Restrictions  Precautions Precaution Comments: NG tube, NPO Prior Functioning  Home Living Lives With: Alone Receives Help From: Family (Family lives nearby and can provide help) Type of Home: House Home Layout: One level Home Access: Stairs to enter Entrance Stairs-Rails:  (pt reports pull available but not rail) Secretary/administrator of Steps: 3 Bathroom Shower/Tub: Engineer, manufacturing systems: Standard Home Adaptive Equipment: Walker - rolling;Bedside commode/3-in-1;Tub transfer bench Prior Function Level of Independence: Independent with basic ADLs;Independent with transfers;Independent with  homemaking with ambulation;Independent with gait Driving:  Yes Vocation: Retired Financial risk analyst Arousal/Alertness: Awake/alert Overall Cognitive Status: Appears within functional limits for tasks assessed Orientation Level: Oriented X4 Sensation/Coordination   Extremity Assessment RLE Assessment RLE Assessment: Within Functional Limits LLE Assessment LLE Assessment: Within Functional Limits Mobility (including Balance) Bed Mobility Bed Mobility: Yes Rolling Left: 6: Modified independent (Device/Increase time);With rail (Verbal cues for sequence, HOB 0 degrees) Left Sidelying to Sit: 4: Min assist;HOB flat;With rails Left Sidelying to Sit Details (indicate cue type and reason): Verbal cues to swing legs and push with arms, min assist to lift trunk and bilateral upper extremities into sitting.  Sitting - Scoot to Edge of Bed: 5: Supervision Sitting - Scoot to [place] of Bed Details (indicate cue type and reason): Verbal cues for sequence.  Transfers Transfers: Yes Sit to Stand: 4: Min assist;From bed Sit to Stand Details (indicate cue type and reason): Cues for hand placement, min assist for anterior translation. Stand to Sit: 4: Min assist Stand to Sit Details: Cues for hand placement, min assist for control of descent.  Ambulation/Gait Ambulation/Gait: Yes Ambulation/Gait Assistance: 5: Supervision Ambulation/Gait Assistance Details (indicate cue type and reason): Verbal cues for upright posture and to stay inside walker.  Ambulation Distance (Feet): 80 Feet Assistive device: Rolling walker Gait Pattern: Step-through pattern;Decreased stride length;Trunk flexed    Exercise  General Exercises - Lower Extremity Long Arc Quad: AROM;Strengthening;Both;15 reps;Seated Hip Flexion/Marching: AROM;Strengthening;Both;Other reps (comment);Seated (15 reps ) End of Session PT - End of Session Equipment Utilized During Treatment: Gait belt Activity Tolerance: Patient limited by fatigue, noted tachycardia with ambulation; patient in no acute  distress.  Patient left: in chair;with call bell in reach General Behavior During Session: [facility] for tasks performed Cognition: [facility] for tasks performed Discussed further PT in acute setting and help from family members upon discharge.   Laney Pastor, SPT  02/06/2011, 4:45 PM  Agree with student note and addendum for HHPT Toney Sang, PT [phone]

## 2011-02-07 LAB — CBC
HCT: 22.9 % — ABNORMAL LOW (ref 36.0–46.0)
HCT: 25.6 % — ABNORMAL LOW (ref 36.0–46.0)
MCV: 90.2 fL (ref 78.0–100.0)
MCV: 88 fL (ref 78.0–100.0)
Platelets: 329 10*3/uL (ref 150–400)
Platelets: 296 10*3/uL (ref 150–400)
RBC: 2.54 MIL/uL — ABNORMAL LOW (ref 3.87–5.11)
RBC: 2.91 MIL/uL — ABNORMAL LOW (ref 3.87–5.11)
RDW: 14.3 % (ref 11.5–15.5)
RDW: 14.6 % (ref 11.5–15.5)
WBC: 14 10*3/uL — ABNORMAL HIGH (ref 4.0–10.5)
WBC: 12 10*3/uL — ABNORMAL HIGH (ref 4.0–10.5)

## 2011-02-07 LAB — BASIC METABOLIC PANEL
Calcium: 8.1 mg/dL — ABNORMAL LOW (ref 8.4–10.5)
GFR calc non Af Amer: 38 mL/min — ABNORMAL LOW (ref >90)
Potassium: 3.8 mEq/L (ref 3.5–5.1)
Sodium: 135 mEq/L (ref 135–145)

## 2011-02-07 LAB — DIFFERENTIAL
Basophils Absolute: 0 10*3/uL (ref 0.0–0.1)
Eosinophils Relative: 1 % (ref 0–5)
Lymphocytes Relative: 9 % — ABNORMAL LOW (ref 12–46)
Lymphs Abs: 1.3 10*3/uL (ref 0.7–4.0)
Monocytes Relative: 8 % (ref 3–12)
Neutro Abs: 11.5 10*3/uL — ABNORMAL HIGH (ref 1.7–7.7)

## 2011-02-07 LAB — GLUCOSE, CAPILLARY

## 2011-02-07 MED ORDER — HYDROMORPHONE 0.3 MG/ML IV SOLN
INTRAVENOUS | Status: AC
  Filled 2011-02-07: qty 25

## 2011-02-07 MED ORDER — ALTEPLASE 2 MG IJ SOLR
2.0000 mg | Freq: Once | INTRAMUSCULAR | Status: AC
  Administered 2011-02-07: 2 mg
  Filled 2011-02-07: qty 2

## 2011-02-07 MED ORDER — ALTEPLASE 2 MG IJ SOLR: 2.0000 mg | Freq: Once | INTRAMUSCULAR | Status: DC

## 2011-02-07 MED ORDER — INSULIN ASPART 100 UNIT/ML ~~LOC~~ SOLN
0.0000 [IU] | SUBCUTANEOUS | Status: DC
  Administered 2011-02-07 – 2011-02-14 (×18): 1 [IU] via SUBCUTANEOUS
  Administered 2011-02-14: 2 [IU] via SUBCUTANEOUS
  Filled 2011-02-07: qty 3

## 2011-02-07 MED ORDER — DIPHENHYDRAMINE HCL 25 MG PO CAPS
25.0000 mg | ORAL_CAPSULE | Freq: Once | ORAL | Status: AC
  Administered 2011-02-07: 25 mg via ORAL
  Filled 2011-02-07: qty 1

## 2011-02-07 MED ORDER — FAT EMULSION 20 % IV EMUL
250.0000 mL | INTRAVENOUS | Status: AC
  Administered 2011-02-07: 250 mL via INTRAVENOUS
  Filled 2011-02-07: qty 250

## 2011-02-07 MED ORDER — TRACE MINERALS CR-CU-MN-SE-ZN 10-1000-500-60 MCG/ML IV SOLN
INTRAVENOUS | Status: AC
  Administered 2011-02-07: 18:00:00 via INTRAVENOUS
  Filled 2011-02-07: qty 2000

## 2011-02-07 MED ORDER — ACETAMINOPHEN 325 MG PO TABS
650.0000 mg | ORAL_TABLET | Freq: Once | ORAL | Status: AC
  Administered 2011-02-07: 650 mg via ORAL
  Filled 2011-02-07: qty 2

## 2011-02-07 MED ORDER — PHENOL 1.4 % MT LIQD
1.0000 | OROMUCOSAL | Status: DC | PRN
  Administered 2011-02-08 (×3): 1 via OROMUCOSAL
  Filled 2011-02-07: qty 177

## 2011-02-07 NOTE — Progress Notes (Signed)
Agree with student. Encouraged pt to ambulate with nursing over weekend and RN aware.  Toney Sang, PT [phone]

## 2011-02-07 NOTE — Progress Notes (Signed)
Physical Therapy Treatment Patient Details Name: Priscilla [name] MRN: [id number] DOB: 07-20-1930 Today's Date: 02/07/2011  PT Assessment/Plan  PT - Assessment/Plan Comments on Treatment Session: Pt. tolerated treatment well is progressing quickly with ambulation. Pt. states no pain at rest increasing to 10 with bed mobility and 5 with ambulation. Pt. still requires supervision with transfers and ambulation for safety.  PT Plan: Discharge plan remains appropriate PT Goals  Acute Rehab PT Goals Pt will Roll Supine to Left Side: Independently PT Goal: Rolling Supine to Left Side - Progress: Progressing toward goal Pt will go Supine/Side to Sit: Independently PT Goal: Supine/Side to Sit - Progress: Progressing toward goal Pt will Transfer Sit to Stand/Stand to Sit: with modified independence PT Transfer Goal: Sit to Stand/Stand to Sit - Progress: Progressing toward goal Pt will Ambulate: >150 feet;with least restrictive assistive device;with modified independence PT Goal: Ambulate - Progress: Progressing toward goal  PT Treatment Precautions/Restrictions  Precautions Precautions: Fall Precaution Comments: Also NG tube, NPO Restrictions Weight Bearing Restrictions: No Mobility (including Balance) Bed Mobility Bed Mobility: Yes Rolling Left: With rail;5: Supervision (Verbal cues for sequence, HOB 0 degrees ) Left Sidelying to Sit: 4: Min assist;HOB flat;With rails Left Sidelying to Sit Details (indicate cue type and reason): Cues for sequence Sitting - Scoot to Edge of Bed: 5: Supervision Sitting - Scoot to Edge of Bed Details (indicate cue type and reason): Verbal cues to initiate. Transfers Transfers: Yes Sit to Stand: 5: Supervision;From bed Sit to Stand Details (indicate cue type and reason): Verbal cues for hand placement.  Stand to Sit: 5: Supervision;To chair/3-in-1 Stand to Sit Details: Verbal cues for hand placement.  Ambulation/Gait Ambulation/Gait: Yes Ambulation/Gait  Assistance: 5: Supervision Ambulation/Gait Assistance Details (indicate cue type and reason): Verbal cues to stay inside walker.  Ambulation Distance (Feet): 180 Feet Assistive device: Rolling walker Gait Pattern: Step-through pattern;Decreased stride length    Exercise  General Exercises - Lower Extremity Long Arc Quad: AROM;Strengthening;Both;20 reps;Seated Hip ABduction/ADduction: AROM;Strengthening;Both;Seated;Other reps (comment) (25 reps) Hip Flexion/Marching: AROM;Strengthening;Both;Other reps (comment);Seated (15 reps ) End of Session PT - End of Session Equipment Utilized During Treatment: Gait belt Activity Tolerance: Patient tolerated treatment well Patient left: in chair;with call bell in reach Nurse Communication: Mobility status for transfers;Mobility status for ambulation General Behavior During Session: [facility] for tasks performed Cognition: [facility] for tasks performed  Laney Pastor, SPT  02/07/2011, 1:15 PM

## 2011-02-07 NOTE — Progress Notes (Signed)
Subjective: Feeling better today, minimal nausea. Sat up yesterday and today.  Objective: Vital signs in last 24 hours: Filed Vitals:   02/07/11 0434 02/07/11 0445 02/07/11 0520 02/07/11 0800  BP:   122/42   Pulse:   107   Temp:   98.6 F (37 C)   TempSrc:   Oral   Resp: 20 20 20 18   Height:      Weight:      SpO2: 99% 99% 97% 98%   Weight change:   Intake/Output Summary (Last 24 hours) at 02/07/11 0849 Last data filed at 02/07/11 1610  Gross per 24 hour  Intake   1360 ml  Output   1035 ml  Net    325 ml   Physical Exam: General: Awake, NAD. HEENT: EOMI. Neck: Supple CV: S1 and S2 Lungs: Clear to ascultation bilaterally Abdomen: Soft, generalized tenderness, Nondistended, +bowel sounds. Ext: Good pulses. Trace edema.  Lab Results:  [facility] 02/07/11 0554 02/06/11 0535  NA 135 137  K 3.8 4.2  CL 106 105  CO2 22 22  GLUCOSE 137* 124*  BUN 11 10  CREATININE 1.31* 1.57*  CALCIUM 8.1* 7.7*  MG 1.7 1.3*  PHOS -- 4.5    Basename 02/05/11 0536  AST 12  ALT 9  ALKPHOS 88  BILITOT 0.3  PROT 4.9*  ALBUMIN 2.2*   No results found for this basename: LIPASE:2,AMYLASE:2 in the last 72 hours  Basename 02/07/11 0554 02/06/11 0535  WBC 14.0* 16.6*  NEUTROABS 11.5* --  HGB 7.5* 8.9*  HCT 22.9* 26.3*  MCV 90.2 89.2  PLT 329 315   No results found for this basename: CKTOTAL:3,CKMB:3,CKMBINDEX:3,TROPONINI:3 in the last 72 hours No results found for this basename: POCBNP:3 in the last 72 hours No results found for this basename: DDIMER:2 in the last 72 hours No results found for this basename: HGBA1C:2 in the last 72 hours No results found for this basename: CHOL:2,HDL:2,LDLCALC:2,TRIG:2,CHOLHDL:2,LDLDIRECT:2 in the last 72 hours No results found for this basename: TSH,T4TOTAL,FREET3,T3FREE,THYROIDAB in the last 72 hours No results found for this basename: VITAMINB12:2,FOLATE:2,FERRITIN:2,TIBC:2,IRON:2,RETICCTPCT:2 in the last 72 hours  Micro Results: No results  found for this or any previous visit (from the past 240 hour(s)).  Medications: I have reviewed the patient's current medications. Scheduled Meds:    . acetaminophen  650 mg Oral Once  . diphenhydrAMINE  25 mg Oral Once  . ertapenem (INVANZ) IV  1 g Intravenous Q24H  . heparin  5,000 Units Subcutaneous Q8H  . HYDROmorphone PCA 0.3 mg/mL   Intravenous Q4H  . HYDROmorphone PCA 0.3 mg/mL      . magnesium sulfate IVPB  2 g Intravenous Once  . DISCONTD: magnesium  2 g Intravenous Once   Continuous Infusions:    . dextrose 5 % and 0.45 % NaCl with KCl 20 mEq/L 125 mL/hr at 02/06/11 2211   PRN Meds:.diphenhydrAMINE, diphenhydrAMINE, LORazepam, naloxone, ondansetron (ZOFRAN) IV, ondansetron, sodium chloride, sodium chloride  Assessment/Plan: 1. SBO (small bowel obstruction) - Surgery on 02/05/2011 with lysis of adhesion.  Appreciate assistance from surgery.  2. Dehydration.  Improved with IV hydration. On minimal hydration.  3. ARF - Improving likely due to relative hypoperfusion from surgery. Continue to monitor. Blood pressure improved.  4. Enteritis.  Currently on ertapenem.  5. Mild hypotension.  Resolved, likely due to surgery.  6. Diarrhea.  May be due to #1 and #4. Resolved.  7. Hypokalemia.  Replace when necessary.  Maybe due to #6.  8. Leg tenderness.  Lower  extremity dopplers are negative.  9. Dyspnea.  Resolved.  10. Hypomagnesemia. Replace PRN.   11. Anemia. Likely dilutional versus acute blood loss from surgery. Will transfuse the patient 1 unit of pRBC, as the patient is symptomatic and feeling weak.    LOS: 11 days  Agueda Houpt A, MD 02/07/2011, 8:49 AM

## 2011-02-07 NOTE — Progress Notes (Signed)
PARENTERAL NUTRITION CONSULT NOTE - INITIAL  Pharmacy Consult for TNA Indication: Post-op ileus, prolonged NPO  Allergies  Allergen Reactions  . Morphine And Related Nausea And Vomiting  . Lisinopril Cough    Patient Measurements: Height: 5\' 2"  (157.5 cm) Weight: 160 lb 8 oz (72.802 kg) IBW/kg (Calculated) : 50.1  Adjusted Body Weight:56.9    Vital Signs: Temp: 98.8 F (37.1 C) (11/16 1055) Temp src: Oral (11/16 1055) BP: 114/44 mmHg (11/16 1055) Pulse Rate: 95  (11/16 1055) Intake/Output from previous day: 11/15 0701 - 11/16 0700 In: 1360 [P.O.:360; I.V.:1000] Out: 1035 [Urine:500; Emesis/NG output:500; Drains:35] Intake/Output from this shift: Total I/O In: 250 [I.V.:250] Out: -   Labs:  Basename 02/07/11 0554 02/06/11 0535 02/05/11 2130  WBC 14.0* 16.6* 23.8*  HGB 7.5* 8.9* 9.9*  HCT 22.9* 26.3* 29.4*  PLT 329 315 361  APTT -- -- --  INR -- -- --     Basename 02/07/11 0554 02/06/11 0535 02/05/11 2130 02/05/11 1321 02/05/11 0536  NA 135 137 -- 137 --  K 3.8 4.2 -- 3.3* --  CL 106 105 -- -- 105  CO2 22 22 -- -- 24  GLUCOSE 137* 124* -- 89 --  BUN 11 10 -- -- 4*  CREATININE 1.31* 1.57* 1.17* -- --  LABCREA -- -- -- -- --  CREAT24HRUR -- -- -- -- --  CALCIUM 8.1* 7.7* -- -- 7.6*  MG 1.7 1.3* -- -- --  PHOS -- 4.5 -- -- --  PROT -- -- -- -- 4.9*  ALBUMIN -- -- -- -- 2.2*  AST -- -- -- -- 12  ALT -- -- -- -- 9  ALKPHOS -- -- -- -- 88  BILITOT -- -- -- -- 0.3  BILIDIR -- -- -- -- --  IBILI -- -- -- -- --  PREALBUMIN -- -- -- -- --  CHOLHDL -- -- -- -- --  CHOL -- -- -- -- --   Estimated Creatinine Clearance: 33.1 ml/min (by C-G formula based on Cr of 1.31).    Basename 02/05/11 0754  GLUCAP 94    Medical History: Past Medical History  Diagnosis Date  . Hypertension   . Chest pain, atypical   . OA (osteoarthritis)   . IDA (iron deficiency anemia)   . Heart murmur   . Deep vein thrombosis   . Osteoarthritis   . Cancer     colon, rectal     Medications:  Scheduled:    . acetaminophen  650 mg Oral Once  . alteplase  2 mg Intracatheter Once  . alteplase  2 mg Intracatheter Once  . diphenhydrAMINE  25 mg Oral Once  . ertapenem (INVANZ) IV  1 g Intravenous Q24H  . heparin  5,000 Units Subcutaneous Q8H  . HYDROmorphone PCA 0.3 mg/mL   Intravenous Q4H  . HYDROmorphone PCA 0.3 mg/mL      . insulin aspart  0-9 Units Subcutaneous Q4H  . DISCONTD: alteplase  2 mg Intracatheter Once     Assessment: 80yof with high grade SBO, s/p exp lap/LOA/SBR & prolonged period of NPO/CLs x 9 days, to start tpn.  BMet is wnl today.  Pt received Mg bolus yesterday.  Nutritional Goals:  1400-1500 kCal, 87-102 grams of protein per day  Plan:  Will begin TPN at reduced rate of 67ml/hr in anticipation of possible refeeding given prolonged period of NPO, with plan to advance to goal rate of 71ml/hr. 1.  TPN labs in AM 2.  Sensitive SSI, will plan  to d/c if cbgs controlled at goal. 3.  Reduce IVF to [facility].  Jo-Ann Johanning P 02/07/2011,1:30 PM

## 2011-02-07 NOTE — Progress Notes (Signed)
I have seen and examined the patient and agree with the assessment and plans  

## 2011-02-07 NOTE — Progress Notes (Signed)
Pt has double lumen picc line in right arm, placed Nov 12; picc line has not been in use;    Pt had a right ij central line placed in surgery on Nov 14;  Pt to get blood tx today; pt c/o "itching" on right side of neck where central line is; drsg loose;  Suggest removing central line from right ij, and start using the picc line that was placed on Nov 12.   RN aware; please advise .   Thank you.

## 2011-02-07 NOTE — Progress Notes (Signed)
2 Days Post-Op  Subjective: Pt ok. Pain control good. Working with PT. Foley out, voiding on own. NGT output trend down. No flatus yet.  Objective: Vital signs in last 24 hours: Temp:  [98.1 F (36.7 C)-98.9 F (37.2 C)] 98.6 F (37 C) (11/16 0520) Pulse Rate:  [72-152] 107  (11/16 0520) Resp:  [18-20] 18  (11/16 0800) BP: (97-133)/(35-74) 122/42 mmHg (11/16 0520) SpO2:  [90 %-100 %] 98 % (11/16 0800) Last BM Date: 02/06/11  Intake/Output this shift:    Physical Exam: Abdomen: soft, ND. Incision c/d/i. Drain intact, SS, no frank blood.  Labs: CBC  Basename 02/07/11 0554 02/06/11 0535  WBC 14.0* 16.6*  HGB 7.5* 8.9*  HCT 22.9* 26.3*  PLT 329 315   BMET  Basename 02/07/11 0554 02/06/11 0535  NA 135 137  K 3.8 4.2  CL 106 105  CO2 22 22  GLUCOSE 137* 124*  BUN 11 10  CREATININE 1.31* 1.57*  CALCIUM 8.1* 7.7*   LFT  Basename 02/05/11 0536  PROT 4.9*  ALBUMIN 2.2*  AST 12  ALT 9  ALKPHOS 88  BILITOT 0.3  BILIDIR --  IBILI --  LIPASE --   PT/INR No results found for this basename: LABPROT:2,INR:2 in the last 72 hours ABG No results found for this basename: PHART:2,PCO2:2,PO2:2,HCO3:2 in the last 72 hours  Studies/Results: Dg Chest Port 1 View  02/05/2011  *RADIOLOGY REPORT*  Clinical Data: Central line placement  PORTABLE CHEST - 1 VIEW  Comparison: Chest x-ray of 02/02/2011  Findings: Both the right upper extremity central venous line and the right IJ central venous line overlap with the tips in the region of the lower SVC just above the expected right atrial junction.  No pneumothorax is seen.  Mild basilar atelectasis is noted.  There is cardiomegaly present.  An NG tube is present.  IMPRESSION: Both the right IJ and right upper extremity central venous lines overlap with the tips in the lower SVC.  No pneumothorax.  Original Report Authenticated By: Juline Patch, M.D.    Assessment: Principal Problem:  *Enteritis Active Problems:  Acute renal  failure, imrpoving  Dehydration  Hypotension  Hypokalemia, stable  SBO (small bowel obstruction) ABL anemia s/p Procedure(s): EXPLORATORY LAPAROTOMY SMALL BOWEL RESECTION Plan: Cont NGT, NPO Encourage activity. Transfuse 1 unit today per [facility] DC central line  LOS: 11 days    Marianna Fuss 02/07/2011

## 2011-02-08 LAB — GLUCOSE, CAPILLARY
Glucose-Capillary: 141 mg/dL — ABNORMAL HIGH (ref 70–99)
Glucose-Capillary: 136 mg/dL — ABNORMAL HIGH (ref 70–99)
Glucose-Capillary: 112 mg/dL — ABNORMAL HIGH (ref 70–99)

## 2011-02-08 LAB — COMPREHENSIVE METABOLIC PANEL
AST: 16 U/L (ref 0–37)
Albumin: 1.5 g/dL — ABNORMAL LOW (ref 3.5–5.2)
Alkaline Phosphatase: 97 U/L (ref 39–117)
CO2: 26 mEq/L (ref 19–32)
Chloride: 105 mEq/L (ref 96–112)
Creatinine, Ser: 0.77 mg/dL (ref 0.50–1.10)
GFR calc non Af Amer: 78 mL/min — ABNORMAL LOW (ref >90)
Potassium: 3.3 mEq/L — ABNORMAL LOW (ref 3.5–5.1)
Total Bilirubin: 0.6 mg/dL (ref 0.3–1.2)

## 2011-02-08 LAB — CBC
HCT: 24.6 % — ABNORMAL LOW (ref 36.0–46.0)
Hemoglobin: 8.3 g/dL — ABNORMAL LOW (ref 12.0–15.0)
MCH: 29.5 pg (ref 26.0–34.0)
MCHC: 33.7 g/dL (ref 30.0–36.0)
MCV: 87.5 fL (ref 78.0–100.0)
RBC: 2.81 MIL/uL — ABNORMAL LOW (ref 3.87–5.11)

## 2011-02-08 LAB — DIFFERENTIAL
Basophils Relative: 0 % (ref 0–1)
Eosinophils Absolute: 0.1 10*3/uL (ref 0.0–0.7)
Eosinophils Relative: 1 % (ref 0–5)
Lymphs Abs: 0.6 10*3/uL — ABNORMAL LOW (ref 0.7–4.0)
Monocytes Absolute: 0.8 10*3/uL (ref 0.1–1.0)
Monocytes Relative: 7 % (ref 3–12)

## 2011-02-08 MED ORDER — FAT EMULSION 20 % IV EMUL
250.0000 mL | INTRAVENOUS | Status: AC
  Administered 2011-02-08: 250 mL via INTRAVENOUS
  Filled 2011-02-08: qty 250

## 2011-02-08 MED ORDER — MAGNESIUM SULFATE 50 % IJ SOLN: 2.0000 g | Freq: Once | INTRAMUSCULAR | Status: DC

## 2011-02-08 MED ORDER — CHLORHEXIDINE GLUCONATE 0.12 % MT SOLN
15.0000 mL | Freq: Two times a day (BID) | OROMUCOSAL | Status: DC
  Administered 2011-02-08 – 2011-02-15 (×14): 15 mL via OROMUCOSAL
  Filled 2011-02-08 (×10): qty 15

## 2011-02-08 MED ORDER — POTASSIUM PHOSPHATE DIBASIC 3 MMOLE/ML IV SOLN
15.0000 mmol | Freq: Once | INTRAVENOUS | Status: AC
  Administered 2011-02-08: 15 mmol via INTRAVENOUS
  Filled 2011-02-08: qty 5

## 2011-02-08 MED ORDER — BIOTENE DRY MOUTH MT LIQD
15.0000 mL | Freq: Two times a day (BID) | OROMUCOSAL | Status: DC
  Administered 2011-02-08 – 2011-02-15 (×15): 15 mL via OROMUCOSAL

## 2011-02-08 MED ORDER — CLINIMIX E/DEXTROSE (5/15) 5 % IV SOLN
INTRAVENOUS | Status: AC
  Administered 2011-02-08: 18:00:00 via INTRAVENOUS
  Filled 2011-02-08: qty 1000

## 2011-02-08 MED ORDER — POTASSIUM CHLORIDE 10 MEQ/50ML IV SOLN
10.0000 meq | INTRAVENOUS | Status: AC
  Administered 2011-02-08 – 2011-02-09 (×4): 10 meq via INTRAVENOUS
  Filled 2011-02-08 (×4): qty 50

## 2011-02-08 MED ORDER — POTASSIUM CHLORIDE 10 MEQ/100ML IV SOLN: 10.0000 meq | INTRAVENOUS | Status: DC

## 2011-02-08 MED ORDER — MAGNESIUM SULFATE 50 % IJ SOLN
2.0000 g | Freq: Once | INTRAVENOUS | Status: AC
  Administered 2011-02-08: 2 g via INTRAVENOUS
  Filled 2011-02-08: qty 4

## 2011-02-08 NOTE — Progress Notes (Signed)
  3 Days Post-Op  3 Days Post-Op  Subjective: Pleasant lady sitting up with NG in place.  Passing some flatus.  Pain controlled with PCA.  Had "blockage".   Objective: Vital signs in last 24 hours: Temp:  [98.3 F (36.8 C)-99 F (37.2 C)] 98.9 F (37.2 C) (11/17 0440) Pulse Rate:  [95-109] 101  (11/17 0440) Resp:  [16-18] 16  (11/17 0833) BP: (114-149)/(32-57) 149/57 mmHg (11/17 0440) SpO2:  [93 %-97 %] 95 % (11/17 0833)   Intake/Output from previous day: 11/16 0701 - 11/17 0700 In: 957.9 [I.V.:539.4; IV Piggyback:50; TPN:368.5] Out: 1090 [Urine:450; Emesis/NG output:600; Drains:40] Intake/Output this shift:    Dressing dry and intact  Lab Results:   Basename 02/08/11 0600 02/07/11 2255  WBC 10.6* 12.0*  HGB 8.3* 8.7*  HCT 24.6* 25.6*  PLT 307 296   BMET  Basename 02/08/11 0600 02/07/11 0554  NA 138 135  K 3.3* 3.8  CL 105 106  CO2 26 22  GLUCOSE 139* 137*  BUN 12 11  CREATININE 0.77 1.31*  CALCIUM 7.9* 8.1*   PT/INR No results found for this basename: LABPROT:2,INR:2 in the last 72 hours  Studies/Results: No results found.  Anti-infectives: Anti-infectives     Start     Dose/Rate Route Frequency Ordered Stop   02/06/11 1500   ertapenem (INVANZ) 1 g in sodium chloride 0.9 % 50 mL IVPB        1 g 100 mL/hr over 30 Minutes Intravenous Every 24 hours 02/05/11 1526     02/04/11 1706   ertapenem (INVANZ) 1 g in sodium chloride 0.9 % 50 mL IVPB        1 g 100 mL/hr over 30 Minutes Intravenous 60 min pre-op 02/04/11 1706 02/05/11 1200   02/02/11 0015   ciprofloxacin (CIPRO) IVPB 400 mg  Status:  Discontinued        400 mg 200 mL/hr over 60 Minutes Intravenous Every 12 hours 02/01/11 1314 02/05/11 2128   01/29/11 1200   ciprofloxacin (CIPRO) IVPB 400 mg  Status:  Discontinued        400 mg 200 mL/hr over 60 Minutes Intravenous Every 24 hours 01/28/11 1538 02/01/11 1314   01/28/11 0945   ciprofloxacin (CIPRO) IVPB 400 mg  Status:  Discontinued        400 mg 200 mL/hr over 60 Minutes Intravenous Every 12 hours 01/28/11 0933 01/28/11 1537   01/28/11 0945   metroNIDAZOLE (FLAGYL) IVPB 500 mg  Status:  Discontinued        500 mg 100 mL/hr over 60 Minutes Intravenous Every 8 hours 01/28/11 0933 02/03/11 0913          Assessment/Plan: Maintain NG for now.  Get up more.  Use Miconazole for perineal fungus.   3 Days Post-Op    LOS: 12 days    Matt B. Daphine Deutscher, MD, [facility] [phone] beeper [phone]  02/08/2011 8:43 AM

## 2011-02-08 NOTE — Consult Note (Signed)
Subjective: Feeling better today.  Started on peripheral nutrition. Objective: Vital signs in last 24 hours: Filed Vitals:   02/08/11 0447 02/08/11 0833 02/08/11 1035 02/08/11 1149  BP:      Pulse:      Temp:      TempSrc:      Resp: 18 16 18    Height:      Weight:      SpO2: 94% 95% 96% 94%   Weight change:   Intake/Output Summary (Last 24 hours) at 02/08/11 1429 Last data filed at 02/08/11 0653  Gross per 24 hour  Intake 657.87 ml  Output   1090 ml  Net -432.13 ml   Physical Exam: General: Awake, NAD. HEENT: EOMI. Neck: Supple CV: S1 and S2 Lungs: Clear to ascultation bilaterally Abdomen: Soft, generalized tenderness, Nondistended, +bowel sounds. Ext: Good pulses. Trace edema.  Lab Results:  Basename 02/08/11 0600 02/07/11 0554 02/06/11 0535  NA 138 135 --  K 3.3* 3.8 --  CL 105 106 --  CO2 26 22 --  GLUCOSE 139* 137* --  BUN 12 11 --  CREATININE 0.77 1.31* --  CALCIUM 7.9* 8.1* --  MG 1.4* 1.7 --  PHOS 2.2* -- 4.5    Basename 02/08/11 0600  AST 16  ALT 9  ALKPHOS 97  BILITOT 0.6  PROT 4.7*  ALBUMIN 1.5*   No results found for this basename: LIPASE:2,AMYLASE:2 in the last 72 hours  Basename 02/08/11 0600 02/07/11 2255 02/07/11 0554  WBC 10.6* 12.0* --  NEUTROABS 9.1* -- 11.5*  HGB 8.3* 8.7* --  HCT 24.6* 25.6* --  MCV 87.5 88.0 --  PLT 307 296 --   No results found for this basename: CKTOTAL:3,CKMB:3,CKMBINDEX:3,TROPONINI:3 in the last 72 hours No results found for this basename: POCBNP:3 in the last 72 hours No results found for this basename: DDIMER:2 in the last 72 hours No results found for this basename: HGBA1C:2 in the last 72 hours  Basename 02/08/11 0600  CHOL 87  HDL --  LDLCALC --  TRIG 142  CHOLHDL --  LDLDIRECT --   No results found for this basename: TSH,T4TOTAL,FREET3,T3FREE,THYROIDAB in the last 72 hours No results found for this basename: VITAMINB12:2,FOLATE:2,FERRITIN:2,TIBC:2,IRON:2,RETICCTPCT:2 in the last 72  hours  Micro Results: No results found for this or any previous visit (from the past 240 hour(s)).  Medications: I have reviewed the patient's current medications. Scheduled Meds:    . antiseptic oral rinse  15 mL Mouth Rinse q12n4p  . chlorhexidine  15 mL Mouth Rinse BID  . ertapenem (INVANZ) IV  1 g Intravenous Q24H  . heparin  5,000 Units Subcutaneous Q8H  . HYDROmorphone PCA 0.3 mg/mL   Intravenous Q4H  . HYDROmorphone PCA 0.3 mg/mL      . insulin aspart  0-9 Units Subcutaneous Q4H  . magnesium sulfate infusion  2 g Intravenous Once  . potassium chloride  10 mEq Intravenous Q1 Hr x 4  . potassium phosphate IVPB (mmol)  15 mmol Intravenous Once  . DISCONTD: magnesium sulfate  2 g Intravenous Once  . DISCONTD: potassium chloride  10 mEq Intravenous Q1 Hr x 4   Continuous Infusions:    . dextrose 5 % and 0.45 % NaCl with KCl 20 mEq/L 20 mL/hr at 02/07/11 1323  . TPN (CLINIMIX) +/- additives 30 mL/hr at 02/07/11 1820   And  . fat emulsion 250 mL (02/07/11 1822)  . TPN (CLINIMIX) +/- additives     And  . fat emulsion  PRN Meds:.diphenhydrAMINE, diphenhydrAMINE, LORazepam, naloxone, ondansetron (ZOFRAN) IV, ondansetron, phenol, sodium chloride, sodium chloride  Assessment/Plan: 1. SBO (small bowel obstruction) - Surgery on 02/05/2011 with lysis of adhesion.   2. Dehydration.  Improved with IV hydration.  3. ARF - Resolved. Likely due to relative hypoperfusion from surgery.   4. Enteritis.  Currently on ertapenem.  5. Mild hypotension.  Resolved.  7. Hypokalemia.  Replace when necessary.  8. Hypomagnesemia. Replace PRN.   9. Anemia. Likely dilutional versus acute blood loss from surgery. S/p 1 unit of pRBC. Continue to monitor.    LOS: 12 days  Rethel Sebek A, MD 02/08/2011, 2:29 PM

## 2011-02-08 NOTE — Progress Notes (Signed)
Nutrition Follow-up / Consult  Patient S/P exploratory laparotomy 11/14 due to partial SBO.  Now with post-op ileus and unable to advance po diet.  Diet:  NPO  TPN initiated yesterday with Clinimix E 5/15 at 30 ml/h with 20% Intralipids at 5 ml/h;  Clinmix E 5/15 increasing to 40 ml/h with 20% Intralipids at 5 ml/h to provide 1080 ml total volume, 922 kcals, 48 grams protein daily.  Weight:  72.8 kg  Estimated nutrition needs unchanged:  1400-1500 kcals, 87-102 grams protein daily.   Medications reviewed.   BMET    Component Value Date/Time   NA 138 02/08/2011 0600   K 3.3* 02/08/2011 0600   CL 105 02/08/2011 0600   CO2 26 02/08/2011 0600   GLUCOSE 139* 02/08/2011 0600   BUN 12 02/08/2011 0600   CREATININE 0.77 02/08/2011 0600   CALCIUM 7.9* 02/08/2011 0600   GFRNONAA 78* 02/08/2011 0600   GFRAA >90 02/08/2011 0600    Nutrition Dx:  Inadequate oral intake, ongoing.  New goal:  TPN to meet 90-100% of nutrition needs until patient able to tolerate diet advancement.  Plan:  RD to monitor TPN with Pharmacy and assist with transition to PO diet when patient able.

## 2011-02-08 NOTE — Progress Notes (Signed)
PARENTERAL NUTRITION CONSULT NOTE - FOLLOW UP  Pharmacy Consult:  TNA Indication:  Post-op ileus  Allergies  Allergen Reactions  . Morphine And Related Nausea And Vomiting  . Lisinopril Cough    Patient Measurements: Height: 5\' 2"  (157.5 cm) Weight: 160 lb 8 oz (72.802 kg) IBW/kg (Calculated) : 50.1    Vital Signs: Temp: 98.9 F (37.2 C) (11/17 0440) Temp src: Oral (11/17 0440) BP: 149/57 mmHg (11/17 0440) Pulse Rate: 101  (11/17 0440) Intake/Output from previous day: 11/16 0701 - 11/17 0700 In: 957.9 [I.V.:539.4; IV Piggyback:50; TPN:368.5] Out: 1090 [Urine:450; Emesis/NG output:600; Drains:40] Intake/Output from this shift:    Labs:  [facility] 02/08/11 0600 02/07/11 2255 02/07/11 0554  WBC 10.6* 12.0* 14.0*  HGB 8.3* 8.7* 7.5*  HCT 24.6* 25.6* 22.9*  PLT 307 296 329  APTT -- -- --  INR -- -- --     Basename 02/08/11 0600 02/07/11 0554 02/06/11 0535  NA 138 135 137  K 3.3* 3.8 4.2  CL 105 106 105  CO2 26 22 22   GLUCOSE 139* 137* 124*  BUN 12 11 10   CREATININE 0.77 1.31* 1.57*  LABCREA -- -- --  CREAT24HRUR -- -- --  CALCIUM 7.9* 8.1* 7.7*  MG 1.4* 1.7 1.3*  PHOS 2.2* -- 4.5  PROT 4.7* -- --  ALBUMIN 1.5* -- --  AST 16 -- --  ALT 9 -- --  ALKPHOS 97 -- --  BILITOT 0.6 -- --  BILIDIR -- -- --  IBILI -- -- --  PREALBUMIN -- -- --  CHOLHDL -- -- --  CHOL 87 -- --   Estimated Creatinine Clearance: 54.2 ml/min (by C-G formula based on Cr of 0.77).    Basename 02/08/11 0747 02/08/11 0441 02/08/11 0008  GLUCAP 141* 141* 114*     Nutritional Goals:  1400-1500 kCal, 87-102 grams of protein per day  Assessment/Plan:  72 YOF with high grade SBO s/p ex-lap/LOA/SBR.  Patient to continue on TNA for post-op ileus.  Patient with refeeding syndrome, therefore, will advance TNA cautiously. 1.  Increase Clinimix E 5/15 slightly to 40 ml/hr (goal 75 ml/hr) and continue lipids at 5 ml/hr.  TNA to provide 922 kcal and 48 gm protein daily. 2.  Endo:  CBGs well  controlled.  Plan to d/c if CBGs remain controlled at goal rate. 3.  Lytes:  Hypokalemia, hypophosphatemia, hypomagnesemia.  Goal K+ ~4 for ileus.  Will supplement all three lytes. 4.  Renal:  SCr trending down to baseline, ?accuracy of I/O's.   Currently on D545NS with KCl at 20 ml/hr. 5.  F/U AM labs.    Priscilla [name] 02/08/2011,7:54 AM

## 2011-02-09 DIAGNOSIS — D62 Acute posthemorrhagic anemia: Secondary | ICD-10-CM

## 2011-02-09 LAB — BASIC METABOLIC PANEL
BUN: 10 mg/dL (ref 6–23)
CO2: 28 mEq/L (ref 19–32)
Calcium: 8.1 mg/dL — ABNORMAL LOW (ref 8.4–10.5)
GFR calc Af Amer: >90 mL/min (ref >90)
GFR calc non Af Amer: 85 mL/min — ABNORMAL LOW (ref >90)
GFR calc non Af Amer: 82 mL/min — ABNORMAL LOW (ref >90)
Potassium: 5 mEq/L (ref 3.5–5.1)
Sodium: 133 mEq/L — ABNORMAL LOW (ref 135–145)
Sodium: 136 mEq/L (ref 135–145)

## 2011-02-09 LAB — MAGNESIUM: Magnesium: 1.4 mg/dL — ABNORMAL LOW (ref 1.5–2.5)

## 2011-02-09 LAB — TYPE AND SCREEN: ABO/RH(D): B POS

## 2011-02-09 LAB — GLUCOSE, CAPILLARY
Glucose-Capillary: 133 mg/dL — ABNORMAL HIGH (ref 70–99)
Glucose-Capillary: 123 mg/dL — ABNORMAL HIGH (ref 70–99)
Glucose-Capillary: 115 mg/dL — ABNORMAL HIGH (ref 70–99)

## 2011-02-09 MED ORDER — SODIUM PHOSPHATE 3 MMOLE/ML IV SOLN
10.0000 mmol | Freq: Once | INTRAVENOUS | Status: AC
  Administered 2011-02-09: 10 mmol via INTRAVENOUS
  Filled 2011-02-09: qty 3.33

## 2011-02-09 MED ORDER — FAT EMULSION 20 % IV EMUL
240.0000 mL | INTRAVENOUS | Status: AC
  Administered 2011-02-09: 240 mL via INTRAVENOUS
  Filled 2011-02-09: qty 250

## 2011-02-09 MED ORDER — CLINIMIX E/DEXTROSE (5/15) 5 % IV SOLN
INTRAVENOUS | Status: AC
  Administered 2011-02-09: 17:00:00 via INTRAVENOUS
  Filled 2011-02-09: qty 2000

## 2011-02-09 MED ORDER — MAGNESIUM SULFATE 50 % IJ SOLN
4.0000 g | Freq: Once | INTRAMUSCULAR | Status: DC
  Filled 2011-02-09: qty 8

## 2011-02-09 MED ORDER — MAGNESIUM SULFATE 40 MG/ML IJ SOLN
2.0000 g | Freq: Once | INTRAMUSCULAR | Status: AC
  Administered 2011-02-09: 2 g via INTRAVENOUS
  Filled 2011-02-09: qty 50

## 2011-02-09 NOTE — Progress Notes (Signed)
[facility] Progress Note:   4 Days Post-Op  Subjective: No progress noted by patient in the last 24 hours.  Hg 8.3 noted yesterday.  Today's labs pending.  Scant flatus. NG in place Objective: Vital signs in last 24 hours: Temp:  [98.8 F (37.1 C)-99.6 F (37.6 C)] 99.6 F (37.6 C) (11/18 0426) Pulse Rate:  [94-109] 109  (11/18 0426) Resp:  [18-20] 20  (11/18 0450) BP: (136-150)/(51-57) 143/57 mmHg (11/18 0426) SpO2:  [91 %-97 %] 91 % (11/18 0450)  Intake/Output from previous day: 11/17 0701 - 11/18 0700 In: 1373 [I.V.:160; IV Piggyback:800; TPN:413] Out: 1055 [Urine:450; Emesis/NG output:550; Drains:55] Intake/Output this shift:    Physical Exam:  Abdomen is mildly distended and tender to palpation.  Dressings dry.   Lab Results:   Basename 02/08/11 0600 02/07/11 2255  WBC 10.6* 12.0*  HGB 8.3* 8.7*  HCT 24.6* 25.6*  PLT 307 296   BMET  Basename 02/08/11 0600 02/07/11 0554  NA 138 135  K 3.3* 3.8  CL 105 106  CO2 26 22  GLUCOSE 139* 137*  BUN 12 11  CREATININE 0.77 1.31*  CALCIUM 7.9* 8.1*   PT/INR No results found for this basename: LABPROT:2,INR:2 in the last 72 hours Studies/Results: No results found. Anti-infectives: Anti-infectives     Start     Dose/Rate Route Frequency Ordered Stop   02/06/11 1500   ertapenem (INVANZ) 1 g in sodium chloride 0.9 % 50 mL IVPB        1 g 100 mL/hr over 30 Minutes Intravenous Every 24 hours 02/05/11 1526     02/04/11 1706   ertapenem (INVANZ) 1 g in sodium chloride 0.9 % 50 mL IVPB        1 g 100 mL/hr over 30 Minutes Intravenous 60 min pre-op 02/04/11 1706 02/05/11 1200   02/02/11 0015   ciprofloxacin (CIPRO) IVPB 400 mg  Status:  Discontinued        400 mg 200 mL/hr over 60 Minutes Intravenous Every 12 hours 02/01/11 1314 02/05/11 2128   01/29/11 1200   ciprofloxacin (CIPRO) IVPB 400 mg  Status:  Discontinued        400 mg 200 mL/hr over 60 Minutes Intravenous Every 24 hours 01/28/11 1538 02/01/11  1314   01/28/11 0945   ciprofloxacin (CIPRO) IVPB 400 mg  Status:  Discontinued        400 mg 200 mL/hr over 60 Minutes Intravenous Every 12 hours 01/28/11 0933 01/28/11 1537   01/28/11 0945   metroNIDAZOLE (FLAGYL) IVPB 500 mg  Status:  Discontinued        500 mg 100 mL/hr over 60 Minutes Intravenous Every 8 hours 01/28/11 0933 02/03/11 0913          Assessment/Plan: Problem List: Patient Active Problem List  Diagnoses  . History of valvular heart disease  . Benign hypertensive heart disease without heart failure  . Osteoarthritis  . History of colon cancer  . Rectal cancer  . Acute renal failure  . Dehydration  . Diarrhea  . Enteritis  . Hypotension  . Hypokalemia  . SBO (small bowel obstruction)  . Anemia due to blood loss, acute    Type and screen ordered.  Await CBC today.  Will begin sham feedings with clear liquids and NG in place.   4 Days Post-Op    LOS: 13 days   Matt B. Daphine Deutscher, MD, [facility] [phone] beeper [phone]  02/09/2011 8:44 AM

## 2011-02-09 NOTE — Consult Note (Signed)
Subjective: Feeling better today.  Started on sips of clears and ice chips.  Objective: Vital signs in last 24 hours: Filed Vitals:   02/09/11 0053 02/09/11 0426 02/09/11 0450 02/09/11 0847  BP:  143/57    Pulse:  109    Temp:  99.6 F (37.6 C)    TempSrc:  Oral    Resp: 20 18 20 18   Height:      Weight:      SpO2: 96% 94% 91% 94%   Weight change:   Intake/Output Summary (Last 24 hours) at 02/09/11 1302 Last data filed at 02/09/11 0700  Gross per 24 hour  Intake   1373 ml  Output   1055 ml  Net    318 ml   Physical Exam: General: Awake, NAD. HEENT: EOMI. Neck: Supple CV: S1 and S2 Lungs: Clear to ascultation bilaterally Abdomen: Bandages in place. Ext: Good pulses. Trace edema.  Lab Results:  Basename 02/09/11 1135 02/09/11 0500 02/08/11 0600  NA 136 133* --  K 4.1 5.0 --  CL 99 100 --  CO2 28 25 --  GLUCOSE 113* 333* --  BUN 12 10 --  CREATININE 0.68 0.61 --  CALCIUM 8.1* 7.2* --  MG -- 1.4* 1.4*  PHOS -- 2.6 2.2*    Basename 02/08/11 0600  AST 16  ALT 9  ALKPHOS 97  BILITOT 0.6  PROT 4.7*  ALBUMIN 1.5*   No results found for this basename: LIPASE:2,AMYLASE:2 in the last 72 hours  Basename 02/08/11 0600 02/07/11 2255 02/07/11 0554  WBC 10.6* 12.0* --  NEUTROABS 9.1* -- 11.5*  HGB 8.3* 8.7* --  HCT 24.6* 25.6* --  MCV 87.5 88.0 --  PLT 307 296 --   No results found for this basename: CKTOTAL:3,CKMB:3,CKMBINDEX:3,TROPONINI:3 in the last 72 hours No results found for this basename: POCBNP:3 in the last 72 hours No results found for this basename: DDIMER:2 in the last 72 hours No results found for this basename: HGBA1C:2 in the last 72 hours  Basename 02/08/11 0600  CHOL 87  HDL --  LDLCALC --  TRIG 142  CHOLHDL --  LDLDIRECT --   No results found for this basename: TSH,T4TOTAL,FREET3,T3FREE,THYROIDAB in the last 72 hours No results found for this basename: VITAMINB12:2,FOLATE:2,FERRITIN:2,TIBC:2,IRON:2,RETICCTPCT:2 in the last 72  hours  Micro Results: No results found for this or any previous visit (from the past 240 hour(s)).  Medications: I have reviewed the patient's current medications. Scheduled Meds:    . antiseptic oral rinse  15 mL Mouth Rinse q12n4p  . chlorhexidine  15 mL Mouth Rinse BID  . ertapenem (INVANZ) IV  1 g Intravenous Q24H  . heparin  5,000 Units Subcutaneous Q8H  . HYDROmorphone PCA 0.3 mg/mL   Intravenous Q4H  . insulin aspart  0-9 Units Subcutaneous Q4H  . magnesium sulfate IVPB  2 g Intravenous Once  . potassium chloride  10 mEq Intravenous Q1 Hr x 4  . potassium phosphate IVPB (mmol)  15 mmol Intravenous Once   Continuous Infusions:    . dextrose 5 % and 0.45 % NaCl with KCl 20 mEq/L 20 mL/hr at 02/09/11 0700  . TPN (CLINIMIX) +/- additives 30 mL/hr at 02/07/11 1820   And  . fat emulsion 250 mL (02/07/11 1822)  . TPN (CLINIMIX) +/- additives 40 mL/hr at 02/08/11 1736   And  . fat emulsion 5 kcal (02/09/11 0700)   PRN Meds:.diphenhydrAMINE, diphenhydrAMINE, LORazepam, naloxone, ondansetron (ZOFRAN) IV, ondansetron, phenol, sodium chloride, sodium chloride  Assessment/Plan: 1.  SBO (small bowel obstruction) - Surgery on 02/05/2011 with lysis of adhesion.  Management per surgery.  2. ARF - Resolved.   3. Mild hypotension.  Resolved.  4. Hypokalemia.  Replace when necessary.  5. Hypomagnesemia. Replace PRN.   6. Anemia. Likely dilutional versus acute blood loss from surgery. S/p 1 unit of pRBC.  Will sign off.  Don't hesitate to call the hospitalist service for any questions or concerns.    LOS: 13 days  Shaunna Rosetti A, MD 02/09/2011, 1:02 PM

## 2011-02-09 NOTE — Progress Notes (Signed)
PARENTERAL NUTRITION CONSULT NOTE - FOLLOW UP  Pharmacy Consult:  TNA Indication:  Post-op ileus  Allergies  Allergen Reactions  . Morphine And Related Nausea And Vomiting  . Lisinopril Cough    Patient Measurements: Height: 5\' 2"  (157.5 cm) Weight: 160 lb 8 oz (72.802 kg) IBW/kg (Calculated) : 50.1    Vital Signs: Temp: 99.6 F (37.6 C) (11/18 0426) Temp src: Oral (11/18 0426) BP: 143/57 mmHg (11/18 0426) Pulse Rate: 109  (11/18 0426) Intake/Output from previous day: 11/17 0701 - 11/18 0700 In: 1373 [I.V.:160; IV Piggyback:800; TPN:413] Out: 1055 [Urine:450; Emesis/NG output:550; Drains:55]    Labs:  [facility] 02/08/11 0600 02/07/11 2255 02/07/11 0554  WBC 10.6* 12.0* 14.0*  HGB 8.3* 8.7* 7.5*  HCT 24.6* 25.6* 22.9*  PLT 307 296 329  APTT -- -- --  INR -- -- --     Basename 02/09/11 1135 02/09/11 0500 02/08/11 0600 02/07/11 0554  NA 136 133* 138 --  K 4.1 5.0 3.3* --  CL 99 100 105 --  CO2 28 25 26  --  GLUCOSE 113* 333* 139* --  BUN 12 10 12  --  CREATININE 0.68 0.61 0.77 --  LABCREA -- -- -- --  CREAT24HRUR -- -- -- --  CALCIUM 8.1* 7.2* 7.9* --  MG -- 1.4* 1.4* 1.7  PHOS -- 2.6 2.2* --  PROT -- -- 4.7* --  ALBUMIN -- -- 1.5* --  AST -- -- 16 --  ALT -- -- 9 --  ALKPHOS -- -- 97 --  BILITOT -- -- 0.6 --  BILIDIR -- -- -- --  IBILI -- -- -- --  PREALBUMIN -- -- 5.0* --  CHOLHDL -- -- -- --  CHOL -- -- 87 --   Estimated Creatinine Clearance: 54.2 ml/min (by C-G formula based on Cr of 0.68).    Basename 02/09/11 0427 02/08/11 2348 02/08/11 1946  GLUCAP 133* 121* 111*     Nutritional Goals:  1400-1500 kCal, 87-102 grams of protein per day  Assessment/Plan:  76 YOF with high grade SBO s/p ex-lap/LOA/SBR.  Patient to continue on TNA for post-op ileus.  Noted plan to start sham feeding with clear liquids. 1.  Increase Clinimix E 5/15 to 75 ml/hr and continue lipids at 5 ml/hr.  TNA to provide 100% of needs. 2.  Endo:  CBGs well controlled.   Plan to d/c if CBGs remain controlled at goal rate. 3.  Lytes:  Hypomagnesemia despite replacement yesterday, Phos at low end of normal.   Will replace. 4.  Renal:  SCr trending down to baseline.   Currently on D545NS with KCl at 20 ml/hr. 5.  F/U AM labs and diet advancement.    Phillips Climes Dien 02/09/2011,1:13 PM

## 2011-02-10 LAB — CBC
Hemoglobin: 8 g/dL — ABNORMAL LOW (ref 12.0–15.0)
MCH: 29.9 pg (ref 26.0–34.0)
MCV: 88.8 fL (ref 78.0–100.0)
RBC: 2.68 MIL/uL — ABNORMAL LOW (ref 3.87–5.11)

## 2011-02-10 LAB — GLUCOSE, CAPILLARY
Glucose-Capillary: 113 mg/dL — ABNORMAL HIGH (ref 70–99)
Glucose-Capillary: 116 mg/dL — ABNORMAL HIGH (ref 70–99)
Glucose-Capillary: 124 mg/dL — ABNORMAL HIGH (ref 70–99)
Glucose-Capillary: 124 mg/dL — ABNORMAL HIGH (ref 70–99)
Glucose-Capillary: 128 mg/dL — ABNORMAL HIGH (ref 70–99)

## 2011-02-10 LAB — COMPREHENSIVE METABOLIC PANEL
ALT: 6 U/L (ref 0–35)
AST: 11 U/L (ref 0–37)
Albumin: 1.6 g/dL — ABNORMAL LOW (ref 3.5–5.2)
Alkaline Phosphatase: 103 U/L (ref 39–117)
Potassium: 3.9 mEq/L (ref 3.5–5.1)
Sodium: 133 mEq/L — ABNORMAL LOW (ref 135–145)
Total Protein: 5 g/dL — ABNORMAL LOW (ref 6.0–8.3)

## 2011-02-10 LAB — DIFFERENTIAL
Eosinophils Absolute: 0 10*3/uL (ref 0.0–0.7)
Eosinophils Relative: 1 % (ref 0–5)
Lymphs Abs: 0.7 10*3/uL (ref 0.7–4.0)
Monocytes Relative: 12 % (ref 3–12)

## 2011-02-10 LAB — TRIGLYCERIDES: Triglycerides: 136 mg/dL (ref <150)

## 2011-02-10 LAB — PREALBUMIN: Prealbumin: 5.9 mg/dL — ABNORMAL LOW (ref 17.0–34.0)

## 2011-02-10 MED ORDER — FAT EMULSION 20 % IV EMUL
120.0000 mL | INTRAVENOUS | Status: AC
  Administered 2011-02-10: 120 mL via INTRAVENOUS
  Filled 2011-02-10: qty 200

## 2011-02-10 MED ORDER — CLINIMIX E/DEXTROSE (5/15) 5 % IV SOLN
INTRAVENOUS | Status: DC
  Filled 2011-02-10: qty 2000

## 2011-02-10 MED ORDER — M.V.I. ADULT IV INJ
INJECTION | INTRAVENOUS | Status: AC
  Administered 2011-02-10: 19:00:00 via INTRAVENOUS
  Filled 2011-02-10: qty 2000

## 2011-02-10 MED ORDER — FAT EMULSION 20 % IV EMUL
120.0000 mL | INTRAVENOUS | Status: DC
  Filled 2011-02-10: qty 200

## 2011-02-10 MED ORDER — KCL IN DEXTROSE-NACL 20-5-0.9 MEQ/L-%-% IV SOLN
INTRAVENOUS | Status: DC
  Administered 2011-02-10: 20 mL/h via INTRAVENOUS
  Administered 2011-02-12: 10:00:00 via INTRAVENOUS
  Administered 2011-02-14: 20 mL/h via INTRAVENOUS
  Administered 2011-02-16 – 2011-02-23 (×8): via INTRAVENOUS
  Filled 2011-02-10 (×15): qty 1000

## 2011-02-10 NOTE — Progress Notes (Signed)
Will watch wound.  Seen and agree with note.

## 2011-02-10 NOTE — Progress Notes (Signed)
5 Days Post-Op  Subjective: Pt feels ok.  No flatus/BM.  Up ambulating with RN in hall with walker.  Objective: Vital signs in last 24 hours: Temp:  [98.4 F (36.9 C)-99.6 F (37.6 C)] 99.3 F (37.4 C) (11/19 0547) Pulse Rate:  [113-125] 117  (11/19 0547) Resp:  [18-20] 20  (11/19 0850) BP: (118-139)/(44-56) 132/53 mmHg (11/19 0547) SpO2:  [91 %-95 %] 95 % (11/19 0850) Weight:  [155 lb 4 oz (70.42 kg)] 155 lb 4 oz (70.42 kg) (11/19 0547) Last BM Date:  (pre-op)  Intake/Output from previous day: 11/18 0701 - 11/19 0700 In: -  Out: 1475 [Urine:650; Emesis/NG output:800; Drains:25] Intake/Output this shift: Total I/O In: 0  Out: 900 [Emesis/NG output:900]  PE: Abd: soft, tender appropriately, dressing removed and wicks removed.  Some erythema and tan purulent drainage from the incision where her wicks were.  Two staples were removed and the wound slight opened in 2 areas.  2x2 gauze then used to do a NS WD dressing change.  Minimal BS, NGT with copious amount of bilious thick output.  JP with serosang output.  Lab Results:   Basename 02/10/11 0650 02/08/11 0600  WBC 5.2 10.6*  HGB 8.0* 8.3*  HCT 23.8* 24.6*  PLT 298 307   BMET  Basename 02/10/11 0650 02/09/11 1135  NA 133* 136  K 3.9 4.1  CL 96 99  CO2 29 28  GLUCOSE 127* 113*  BUN 15 12  CREATININE 0.63 0.68  CALCIUM 7.8* 8.1*   PT/INR No results found for this basename: LABPROT:2,INR:2 in the last 72 hours   Studies/Results: No results found.  Anti-infectives: Anti-infectives     Start     Dose/Rate Route Frequency Ordered Stop   02/06/11 1500   ertapenem (INVANZ) 1 g in sodium chloride 0.9 % 50 mL IVPB        1 g 100 mL/hr over 30 Minutes Intravenous Every 24 hours 02/05/11 1526     02/04/11 1706   ertapenem (INVANZ) 1 g in sodium chloride 0.9 % 50 mL IVPB        1 g 100 mL/hr over 30 Minutes Intravenous 60 min pre-op 02/04/11 1706 02/05/11 1200   02/02/11 0015   ciprofloxacin (CIPRO) IVPB 400 mg   Status:  Discontinued        400 mg 200 mL/hr over 60 Minutes Intravenous Every 12 hours 02/01/11 1314 02/05/11 2128   01/29/11 1200   ciprofloxacin (CIPRO) IVPB 400 mg  Status:  Discontinued        400 mg 200 mL/hr over 60 Minutes Intravenous Every 24 hours 01/28/11 1538 02/01/11 1314   01/28/11 0945   ciprofloxacin (CIPRO) IVPB 400 mg  Status:  Discontinued        400 mg 200 mL/hr over 60 Minutes Intravenous Every 12 hours 01/28/11 0933 01/28/11 1537   01/28/11 0945   metroNIDAZOLE (FLAGYL) IVPB 500 mg  Status:  Discontinued        500 mg 100 mL/hr over 60 Minutes Intravenous Every 8 hours 01/28/11 0933 02/03/11 0913           Assessment/Plan  1. S/p ex lap with LOA/SBR 2. Post-op ileus 3. ABL anemia  Plan: 1. NGT has fallen out some, will readv approx 5 cm 2. Begin NS WD dressing changes to abd wound BID.  Will follow closely as there may be several other areas that require opening as well 3. Cont PT/OT 4. Follow h/h   LOS: 14 days  Kathye Cipriani E 02/10/2011

## 2011-02-10 NOTE — Progress Notes (Signed)
02/10/2011 [name] SPARKS Case Management Note [id number]       Utilization review completed.

## 2011-02-10 NOTE — Progress Notes (Addendum)
PARENTERAL NUTRITION CONSULT NOTE - FOLLOW UP  Pharmacy Consult:  TNA Indication:  Post-op ileus  Allergies  Allergen Reactions  . Morphine And Related Nausea And Vomiting  . Lisinopril Cough    Patient Measurements: Height: 5\' 2"  (157.5 cm) Weight: 155 lb 4 oz (70.42 kg) IBW/kg (Calculated) : 50.1    Vital Signs: Temp: 99.3 F (37.4 C) (11/19 0547) Temp src: Oral (11/19 0547) BP: 132/53 mmHg (11/19 0547) Pulse Rate: 117  (11/19 0547) Intake/Output from previous day: 11/18 0701 - 11/19 0700 In: -  Out: 1475 [Urine:650; Emesis/NG output:800; Drains:25]    Labs:  [facility] 02/10/11 0650 02/08/11 0600 02/07/11 2255  WBC 5.2 10.6* 12.0*  HGB 8.0* 8.3* 8.7*  HCT 23.8* 24.6* 25.6*  PLT 298 307 296  APTT -- -- --  INR -- -- --     Basename 02/10/11 0650 02/09/11 1135 02/09/11 0500 02/08/11 0600  NA 133* 136 133* --  K 3.9 4.1 5.0 --  CL 96 99 100 --  CO2 29 28 25  --  GLUCOSE 127* 113* 333* --  BUN 15 12 10  --  CREATININE 0.63 0.68 0.61 --  LABCREA -- -- -- --  CREAT24HRUR -- -- -- --  CALCIUM 7.8* 8.1* 7.2* --  MG 1.7 -- 1.4* 1.4*  PHOS 2.8 -- 2.6 2.2*  PROT 5.0* -- -- 4.7*  ALBUMIN 1.6* -- -- 1.5*  AST 11 -- -- 16  ALT 6 -- -- 9  ALKPHOS 103 -- -- 97  BILITOT 0.3 -- -- 0.6  BILIDIR -- -- -- --  IBILI -- -- -- --  PREALBUMIN -- -- -- 5.0*  CHOLHDL -- -- -- --  CHOL -- -- -- 87   Estimated Creatinine Clearance: 53.2 ml/min (by C-G formula based on Cr of 0.63).    Basename 02/10/11 0437 02/10/11 0221 02/09/11 2009  GLUCAP 132* 128* 115*     Nutritional Goals:  1400-1500 kCal, 87-102 grams of protein per day  Assessment/Plan:  77 YOF with high grade SBO s/p ex-lap/LOA/SBR.  Patient to continue on TNA for post-op ileus.  Noted plan to start sham feeding with clear liquids. 1.  Continue Clinimix E 5/15 at 75 ml/hr and lipids at 5 ml/hr.  TNA providing 1758 kcal and 90 gm protein daily. 2.  Endo:  CBGs well controlled.  Plan to d/c if CBGs remain  controlled tomorrow. 3.  Lytes: mild hyponatremia and will adjust IVF; others WNL. 4.  Renal:  ARF resolved, renal function stable, net negative I/O's.  Currently on IVF at 20 ml/hr. 5.  F/U diet advancement to start weaning TNA.    Phillips Climes Dien 02/10/2011,8:09 AM

## 2011-02-11 DIAGNOSIS — E46 Unspecified protein-calorie malnutrition: Secondary | ICD-10-CM

## 2011-02-11 LAB — BASIC METABOLIC PANEL
BUN: 16 mg/dL (ref 6–23)
CO2: 29 mEq/L (ref 19–32)
Calcium: 7.7 mg/dL — ABNORMAL LOW (ref 8.4–10.5)
Chloride: 95 mEq/L — ABNORMAL LOW (ref 96–112)
Creatinine, Ser: 0.65 mg/dL (ref 0.50–1.10)
GFR calc Af Amer: >90 mL/min (ref >90)
GFR calc non Af Amer: 83 mL/min — ABNORMAL LOW (ref >90)
Glucose, Bld: 120 mg/dL — ABNORMAL HIGH (ref 70–99)
Potassium: 3.5 mEq/L (ref 3.5–5.1)
Sodium: 131 mEq/L — ABNORMAL LOW (ref 135–145)

## 2011-02-11 LAB — GLUCOSE, CAPILLARY
Glucose-Capillary: 123 mg/dL — ABNORMAL HIGH (ref 70–99)
Glucose-Capillary: 110 mg/dL — ABNORMAL HIGH (ref 70–99)
Glucose-Capillary: 102 mg/dL — ABNORMAL HIGH (ref 70–99)

## 2011-02-11 LAB — CBC
Hemoglobin: 7.5 g/dL — ABNORMAL LOW (ref 12.0–15.0)
RBC: 2.52 MIL/uL — ABNORMAL LOW (ref 3.87–5.11)
WBC: 5.7 10*3/uL (ref 4.0–10.5)

## 2011-02-11 MED ORDER — FAT EMULSION 20 % IV EMUL
240.0000 mL | INTRAVENOUS | Status: AC
  Administered 2011-02-11: 240 mL via INTRAVENOUS
  Filled 2011-02-11: qty 250

## 2011-02-11 MED ORDER — PANTOPRAZOLE SODIUM 40 MG IV SOLR
40.0000 mg | Freq: Every day | INTRAVENOUS | Status: DC
  Administered 2011-02-11 – 2011-02-13 (×4): 40 mg via INTRAVENOUS
  Filled 2011-02-11 (×7): qty 40

## 2011-02-11 MED ORDER — CLINIMIX E/DEXTROSE (5/15) 5 % IV SOLN
INTRAVENOUS | Status: AC
  Administered 2011-02-11: 18:00:00 via INTRAVENOUS
  Filled 2011-02-11: qty 2000

## 2011-02-11 NOTE — Progress Notes (Signed)
02/11/2011 Cresencia Asmus SPARKS Case Management Note [phone]       Utilization review completed.  

## 2011-02-11 NOTE — Progress Notes (Signed)
Physical Therapy Treatment Patient Details Name: Priscilla [name] MRN: [id number] DOB: 10-Feb-1931 Today's Date: 02/11/2011  PT Assessment/Plan  PT - Assessment/Plan Comments on Treatment Session: Pt with excellent progression and the focus of next session will be to attempt independent bed mobility and ambulation.  PT Plan: Discharge plan needs to be updated PT Frequency: Min 3X/week Follow Up Recommendations: Home health PT Equipment Recommended: None recommended by PT PT Goals  Acute Rehab PT Goals PT Goal: Rolling Supine to Left Side - Progress: Progressing toward goal PT Goal: Supine/Side to Sit - Progress: Progressing toward goal PT Transfer Goal: Sit to Stand/Stand to Sit - Progress: Met Pt will Ambulate: 51 - 150 feet;Independently PT Goal: Ambulate - Progress: Other (comment) PT Goal: Up/Down Stairs - Progress: Met  PT Treatment Precautions/Restrictions  Precautions Precautions: Fall Precaution Comments: Also NG tube, NPO Restrictions Weight Bearing Restrictions: No Mobility (including Balance) Bed Mobility Rolling Left: 6: Modified independent (Device/Increase time);With rail (Pt will need to perform without rail next session ) Left Sidelying to Sit: 6: Modified independent (Device/Increase time);HOB flat Sitting - Scoot to Edge of Bed: 6: Modified independent (Device/Increase time) Transfers Sit to Stand: 6: Modified independent (Device/Increase time);From bed;From toilet Stand to Sit: 6: Modified independent (Device/Increase time);To chair/3-in-1;With armrests;To toilet Ambulation/Gait Ambulation/Gait: Yes Ambulation/Gait Assistance: 6: Modified independent (Device/Increase time) Ambulation/Gait Assistance Details (indicate cue type and reason): Pt denied further amb secondary to having just amb with nursing Ambulation Distance (Feet): 160 Feet Assistive device: Rolling walker Gait Pattern: Decreased stride length;Trunk flexed Stairs: Yes Stairs Assistance: 6:  Modified independent (Device/Increase time) Stair Management Technique: Two rails Number of Stairs: 4  Height of Stairs: 8     Exercise  General Exercises - Lower Extremity Long Arc Quad: AROM;20 reps;Both;Seated;Strengthening Hip Flexion/Marching: AROM;Both;Other reps (comment);Strengthening;Seated (20 reps) End of Session PT - End of Session Activity Tolerance: Patient tolerated treatment well Patient left: in chair;with family/visitor present;with call bell in reach Nurse Communication: Mobility status for ambulation General Behavior During Session: [facility] for tasks performed Cognition: [facility] for tasks performed  Delorse Lek 02/11/2011, 2:42 PM  Toney Sang, PT [phone]

## 2011-02-11 NOTE — Progress Notes (Signed)
Patient ID: Priscilla [name], female   DOB: [birth year], 77 y.o.   MRN: [id number] 6 Days Post-Op  Subjective: Pt without complaints.  Starting having some loose BMs, unable to control.  ambulating well.  Objective: Vital signs in last 24 hours: Temp:  [98.1 F (36.7 C)-99.8 F (37.7 C)] 98.1 F (36.7 C) (11/20 0600) Pulse Rate:  [103-118] 103  (11/20 0600) Resp:  [16-20] 20  (11/20 0800) BP: (105-144)/(44-53) 130/45 mmHg (11/20 0600) SpO2:  [91 %-99 %] 96 % (11/20 0800) Last BM Date: 02/10/11  Intake/Output from previous day: 11/19 0701 - 11/20 0700 In: 2575.5 [I.V.:700.8; TPN:1874.6] Out: 2980 [Urine:400; Emesis/NG output:2540; Drains:40] Intake/Output this shift:    PE: Abd: soft, few BS, still with copious amounts of NGT output.  Nondistended.  All staples removed today due to cont erythema.  Upper part of incision opened up on its own.  This was packed with NS WD dressing.  Lab Results:   Basename 02/11/11 0510 02/10/11 0650  WBC 5.7 5.2  HGB 7.5* 8.0*  HCT 22.6* 23.8*  PLT 278 298   BMET  Basename 02/11/11 0510 02/10/11 0650  NA 131* 133*  K 3.5 3.9  CL 95* 96  CO2 29 29  GLUCOSE 120* 127*  BUN 16 15  CREATININE 0.65 0.63  CALCIUM 7.7* 7.8*   PT/INR No results found for this basename: LABPROT:2,INR:2 in the last 72 hours   Studies/Results: No results found.  Anti-infectives: Anti-infectives     Start     Dose/Rate Route Frequency Ordered Stop   02/06/11 1500   ertapenem (INVANZ) 1 g in sodium chloride 0.9 % 50 mL IVPB        1 g 100 mL/hr over 30 Minutes Intravenous Every 24 hours 02/05/11 1526     02/04/11 1706   ertapenem (INVANZ) 1 g in sodium chloride 0.9 % 50 mL IVPB        1 g 100 mL/hr over 30 Minutes Intravenous 60 min pre-op 02/04/11 1706 02/05/11 1200   02/02/11 0015   ciprofloxacin (CIPRO) IVPB 400 mg  Status:  Discontinued        400 mg 200 mL/hr over 60 Minutes Intravenous Every 12 hours 02/01/11 1314 02/05/11 2128   01/29/11 1200    ciprofloxacin (CIPRO) IVPB 400 mg  Status:  Discontinued        400 mg 200 mL/hr over 60 Minutes Intravenous Every 24 hours 01/28/11 1538 02/01/11 1314   01/28/11 0945   ciprofloxacin (CIPRO) IVPB 400 mg  Status:  Discontinued        400 mg 200 mL/hr over 60 Minutes Intravenous Every 12 hours 01/28/11 0933 01/28/11 1537   01/28/11 0945   metroNIDAZOLE (FLAGYL) IVPB 500 mg  Status:  Discontinued        500 mg 100 mL/hr over 60 Minutes Intravenous Every 8 hours 01/28/11 0933 02/03/11 0913           Assessment/Plan  1. S/p ex lap with extensive LOA, SBR 2. Post-op ileus 3. PCM/TNA 4. ABL anemia  Plan: Cont NGT for now as pt with 2.5L of output yesterday, despite beginning to have some liquid BMs. Cont NS WD dressing changes daily to entire wound Cont mobilization May have ice chips, hard candy, and gum Cont to follow hgb.    LOS: 15 days    Priscilla [name] E 02/11/2011

## 2011-02-11 NOTE — Progress Notes (Signed)
PARENTERAL NUTRITION CONSULT NOTE - FOLLOW UP  Pharmacy Consult for TPN Indication: S/p ex lap with extensive LOA, SBR with post-op ileus   Allergies  Allergen Reactions  . Morphine And Related Nausea And Vomiting  . Lisinopril Cough    Patient Measurements: Height: 5\' 2"  (157.5 cm) Weight: 155 lb 4 oz (70.42 kg) IBW/kg (Calculated) : 50.1  Adjusted Body Weight:   Usual Weight:    Vital Signs: Temp: 98.4 F (36.9 C) (11/20 0900) Temp src: Oral (11/20 0900) BP: 149/53 mmHg (11/20 0900) Pulse Rate: 117  (11/20 0900) Intake/Output from previous day: 11/19 0701 - 11/20 0700 In: 2575.5 [I.V.:700.8; TPN:1874.6] Out: 2980 [Urine:400; Emesis/NG output:2540; Drains:40] Intake/Output from this shift:    Labs:  [facility] 02/11/11 0510 02/10/11 0650  WBC 5.7 5.2  HGB 7.5* 8.0*  HCT 22.6* 23.8*  PLT 278 298  APTT -- --  INR -- --     Basename 02/11/11 0510 02/10/11 0650 02/09/11 1135 02/09/11 0500  NA 131* 133* 136 --  K 3.5 3.9 4.1 --  CL 95* 96 99 --  CO2 29 29 28  --  GLUCOSE 120* 127* 113* --  BUN 16 15 12  --  CREATININE 0.65 0.63 0.68 --  LABCREA -- -- -- --  CREAT24HRUR -- -- -- --  CALCIUM 7.7* 7.8* 8.1* --  MG -- 1.7 -- 1.4*  PHOS -- 2.8 -- 2.6  PROT -- 5.0* -- --  ALBUMIN -- 1.6* -- --  AST -- 11 -- --  ALT -- 6 -- --  ALKPHOS -- 103 -- --  BILITOT -- 0.3 -- --  BILIDIR -- -- -- --  IBILI -- -- -- --  PREALBUMIN -- 5.9* -- --  CHOLHDL -- -- -- --  CHOL -- 95 -- --   Estimated Creatinine Clearance: 53.2 ml/min (by C-G formula based on Cr of 0.65).    Basename 02/11/11 0802 02/11/11 0409 02/10/11 2351  GLUCAP 120* 123* 116*    Medications:  Scheduled:    . antiseptic oral rinse  15 mL Mouth Rinse q12n4p  . chlorhexidine  15 mL Mouth Rinse BID  . ertapenem (INVANZ) IV  1 g Intravenous Q24H  . heparin  5,000 Units Subcutaneous Q8H  . HYDROmorphone PCA 0.3 mg/mL   Intravenous Q4H  . insulin aspart  0-9 Units Subcutaneous Q4H  . pantoprazole  (PROTONIX) IV  40 mg Intravenous QHS    Insulin Requirements in the past 24 hours:   SSI  Current Nutrition:  TPN = 1758 kcal, 90g protein  Assessment: S/p ex lap with extensive LOA, SBR with post-op ileus. NG output 2510/24h. Starting to have some loose BM. No diet yet. Lytes: Na,K, and Cl falling due to large NG output. Glucse: in goal < 150 (113-132)  Nutritional Goals:  1400-1500 kCal, 87-102 grams of protein per day  Plan:  No change to TPN today.  Priscilla [name], [employer] 02/11/2011,11:05 AM

## 2011-02-11 NOTE — Progress Notes (Signed)
STILL WITH SIG ngt DRAINAGE. ON tna.  Watch electrolytes.The patient was seen, examined and PA note reviewed and data reviewed.  I agree with the plan of action.

## 2011-02-11 NOTE — Clinical Documentation Improvement (Signed)
    MALNUTRITION DOCUMENTATION CLARIFICATION  This document is not a permanent part of the medical record and can not be viewed by non- [employer] Employees.   Please update your documentation within the medical record to reflect your response to this query.                                                                                         02/11/11    Dear Dr. Harriette Bouillon / Associates,  In a better effort to capture your patient's severity of illness, reflect appropriate length of stay and utilization of resources, a review of the patient medical record has revealed the following indicators.    Based on your clinical judgment, please clarify and document in a progress note and/or discharge summary the clinical condition associated with the following supporting information:  In responding to this query please exercise your independent judgment.  The fact that a query is asked, does not imply that any particular answer is desired or expected. Possible Clinical Conditions?  _______Mild Malnutrition  _______Moderate Malnutrition _______Severe Malnutrition   _______Protein Calorie Malnutrition _______Severe Protein Calorie Malnutrition _______Emaciation  _______Cachexia   _______Other Condition________________ _______Cannot clinically determine     Supporting Information: Risk Factors: Pt. on TPN, s/p surgery.   Per 11/20 progress note   PCM/TNA.  Signs & Symptoms: -Ht:       Wt: 155 lbs. 4oz  --Diagnostics:  -Albumin level: 1.6 (02/10/11)  -Pre-Albumin level: 5.0 (02/10/11)    -Treatments: -Enteral Feeding:  TPN   -Medications:  -Nutrition Consult: completed 02/08/11     You may use possible, probable, or suspect with inpatient documentation. possible, probable, suspected diagnoses MUST be documented at the time of discharge   Reviewed:  no additional documentation provided  Thank You,  Sincerely, Shelda Pal RN, BSN, CCM   Clinical Documentation  Specialist:  Pager [phone]   Health Information Management [place]  How to Complete  CDI Queries  1. Access your Hospital Chart Completion folder in your  [place].  2. Select the CDI query deficiency.   3. Review note from CDI nurse in the inbasket report located below.  4. If needed, update documentation for the patient's encounter via the notes activity.   5. After updating or not,   access your deficiency again and click edit on the [employer].   6. Click F2 to complete all required fields concerning your review. 7. Sign note.  8. The deficiency will fall out of you InBasket.

## 2011-02-12 LAB — GLUCOSE, CAPILLARY
Glucose-Capillary: 112 mg/dL — ABNORMAL HIGH (ref 70–99)
Glucose-Capillary: 118 mg/dL — ABNORMAL HIGH (ref 70–99)
Glucose-Capillary: 122 mg/dL — ABNORMAL HIGH (ref 70–99)
Glucose-Capillary: 96 mg/dL (ref 70–99)

## 2011-02-12 LAB — BASIC METABOLIC PANEL
BUN: 15 mg/dL (ref 6–23)
CO2: 27 mEq/L (ref 19–32)
Calcium: 7.8 mg/dL — ABNORMAL LOW (ref 8.4–10.5)
Glucose, Bld: 117 mg/dL — ABNORMAL HIGH (ref 70–99)
Sodium: 135 mEq/L (ref 135–145)

## 2011-02-12 LAB — CBC
HCT: 22.5 % — ABNORMAL LOW (ref 36.0–46.0)
Hemoglobin: 7.4 g/dL — ABNORMAL LOW (ref 12.0–15.0)
MCH: 29.5 pg (ref 26.0–34.0)
RBC: 2.51 MIL/uL — ABNORMAL LOW (ref 3.87–5.11)

## 2011-02-12 MED ORDER — BACITRACIN ZINC 500 UNIT/GM EX OINT
1.0000 "application " | TOPICAL_OINTMENT | Freq: Two times a day (BID) | CUTANEOUS | Status: DC
  Administered 2011-02-12 – 2011-02-22 (×22): 1 via TOPICAL
  Filled 2011-02-12 (×3): qty 15

## 2011-02-12 MED ORDER — FAT EMULSION 20 % IV EMUL
240.0000 mL | INTRAVENOUS | Status: AC
  Administered 2011-02-12: 240 mL via INTRAVENOUS
  Filled 2011-02-12: qty 250

## 2011-02-12 MED ORDER — TRACE MINERALS CR-CU-MN-SE-ZN 10-1000-500-60 MCG/ML IV SOLN
INTRAVENOUS | Status: AC
  Administered 2011-02-12: 19:00:00 via INTRAVENOUS
  Filled 2011-02-12: qty 2000

## 2011-02-12 NOTE — Progress Notes (Signed)
PARENTERAL NUTRITION CONSULT NOTE - FOLLOW UP  Pharmacy Consult for TPN Indication: S/p ex lap with extensive LOA, SBR with post-op ileus   Allergies  Allergen Reactions  . Morphine And Related Nausea And Vomiting  . Lisinopril Cough    Patient Measurements: Height: 5\' 2"  (157.5 cm) Weight: 155 lb 4 oz (70.42 kg) IBW/kg (Calculated) : 50.1  Adjusted Body Weight:   Usual Weight:    Vital Signs: Temp: 98.6 F (37 C) (11/21 0440) BP: 150/49 mmHg (11/21 0440) Pulse Rate: 100  (11/21 0440) Intake/Output from previous day: 11/20 0701 - 11/21 0700 In: 2337.3 [I.V.:528.3; TPN:1809] Out: 1100 [Urine:900; Emesis/NG output:200] Intake/Output from this shift:    Labs:  [facility] 02/12/11 0550 02/11/11 0510 02/10/11 0650  WBC 4.9 5.7 5.2  HGB 7.4* 7.5* 8.0*  HCT 22.5* 22.6* 23.8*  PLT 300 278 298  APTT -- -- --  INR -- -- --     Basename 02/12/11 0550 02/11/11 0510 02/10/11 0650  NA 135 131* 133*  K 3.8 3.5 3.9  CL 102 95* 96  CO2 27 29 29   GLUCOSE 117* 120* 127*  BUN 15 16 15   CREATININE 0.61 0.65 0.63  LABCREA -- -- --  CREAT24HRUR -- -- --  CALCIUM 7.8* 7.7* 7.8*  MG -- -- 1.7  PHOS -- -- 2.8  PROT -- -- 5.0*  ALBUMIN -- -- 1.6*  AST -- -- 11  ALT -- -- 6  ALKPHOS -- -- 103  BILITOT -- -- 0.3  BILIDIR -- -- --  IBILI -- -- --  PREALBUMIN -- -- 5.9*  CHOLHDL -- -- --  CHOL -- -- 95   Estimated Creatinine Clearance: 53.2 ml/min (by C-G formula based on Cr of 0.61).    Basename 02/12/11 0758 02/12/11 0347 02/12/11 0014  GLUCAP 118* 122* 112*    Medications:  Scheduled:     . antiseptic oral rinse  15 mL Mouth Rinse q12n4p  . bacitracin  1 application Topical BID  . chlorhexidine  15 mL Mouth Rinse BID  . ertapenem (INVANZ) IV  1 g Intravenous Q24H  . heparin  5,000 Units Subcutaneous Q8H  . HYDROmorphone PCA 0.3 mg/mL   Intravenous Q4H  . insulin aspart  0-9 Units Subcutaneous Q4H  . pantoprazole (PROTONIX) IV  40 mg Intravenous QHS     Insulin Requirements in the past 24 hours:   SSI: 1 unit since new TPN bag  Current Nutrition:  TPN = 1518 kcal, 90g protein  Assessment: S/p ex lap with extensive LOA, SBR with post-op ileus. NG output decreased significantly, noted plans to clamp today, if tolerated, d/c in AM. Starting to have some loose BM. No diet yet. Lytes, acid-base balance and glycemia ok.  Nutritional Goals:  1400-1500 kCal, 87-102 grams of protein per day  Plan:  1. No change to TPN today except add MVI/trace elements to bag tonight. 2. Will f/up AM labs and progress with NGT.  Chandelle Harkey K. Allena Katz, PharmD, BCPS.  Clinical Pharmacist Pager [phone]. 02/12/2011 11:21 AM

## 2011-02-12 NOTE — Progress Notes (Addendum)
Nutrition Follow Up  Diet: NPO  TPN: Clinimix 5/15 at 75 mL/hr with 20% Intralipids at 5 mL/hr. This is providing 1518 kcal and 90 grams protein, which meets 100% of estimated needs.   Patient is tolerating well, although noted that sodium, potassium, and chloride trending down, likely due to large NG tube output (2.5 L). This has improved to 200 mL over the last 24 hours. NGT clamped.   Meds reviewed.  Labs:  CMP     Component Value Date/Time   NA 135 02/12/2011 0550   K 3.8 02/12/2011 0550   CL 102 02/12/2011 0550   CO2 27 02/12/2011 0550   GLUCOSE 117* 02/12/2011 0550   BUN 15 02/12/2011 0550   CREATININE 0.61 02/12/2011 0550   CALCIUM 7.8* 02/12/2011 0550   PROT 5.0* 02/10/2011 0650   ALBUMIN 1.6* 02/10/2011 0650   AST 11 02/10/2011 0650   ALT 6 02/10/2011 0650   ALKPHOS 103 02/10/2011 0650   BILITOT 0.3 02/10/2011 0650   GFRNONAA 85* 02/12/2011 0550   GFRAA >90 02/12/2011 0550   CBG (last 3)   Basename 02/12/11 0758 02/12/11 0347 02/12/11 0014  GLUCAP 118* 122* 112*   Prealbumin 5.9 on 11/19  Nutrition Dx: Inadequate oral intake, ongoing.  Goal: TPN to meet 90-100% of nutrition needs until able to tolerate diet advancement, meeting  Intervention: 1. Continue TPN per pharmacy, current rate is meeting estimated needs.  2. RD to monitor for diet advancement.   Dietitian [phone]

## 2011-02-12 NOTE — Progress Notes (Signed)
Patient ID: Priscilla [name], female   DOB: April 27, 1930, 77 y.o.   MRN: [id number] 7 Days Post-Op  Subjective: Pt with cont BMs.  Otherwise feels well.  Objective: Vital signs in last 24 hours: Temp:  [98.1 F (36.7 C)-99 F (37.2 C)] 98.6 F (37 C) (11/21 0440) Pulse Rate:  [100-117] 100  (11/21 0440) Resp:  [16-20] 18  (11/21 0440) BP: (136-157)/(49-57) 150/49 mmHg (11/21 0440) SpO2:  [93 %-98 %] 95 % (11/21 0440) Last BM Date: 02/11/11  Intake/Output from previous day: 11/20 0701 - 11/21 0700 In: 2337.3 [I.V.:528.3; TPN:1809] Out: 1100 [Urine:900; Emesis/NG output:200] Intake/Output this shift:    PE: Abd: soft, few BS, ND, appropriately tender, incision is clean and packed.  JP with serosang output.  NGT with only 200cc of thinner bilious output.  Lab Results:   [facility] 02/12/11 0550 02/11/11 0510  WBC 4.9 5.7  HGB 7.4* 7.5*  HCT 22.5* 22.6*  PLT 300 278   BMET  Basename 02/12/11 0550 02/11/11 0510  NA 135 131*  K 3.8 3.5  CL 102 95*  CO2 27 29  GLUCOSE 117* 120*  BUN 15 16  CREATININE 0.61 0.65  CALCIUM 7.8* 7.7*   PT/INR No results found for this basename: LABPROT:2,INR:2 in the last 72 hours   Studies/Results: No results found.  Anti-infectives: Anti-infectives     Start     Dose/Rate Route Frequency Ordered Stop   02/06/11 1500   ertapenem (INVANZ) 1 g in sodium chloride 0.9 % 50 mL IVPB        1 g 100 mL/hr over 30 Minutes Intravenous Every 24 hours 02/05/11 1526     02/04/11 1706   ertapenem (INVANZ) 1 g in sodium chloride 0.9 % 50 mL IVPB        1 g 100 mL/hr over 30 Minutes Intravenous 60 min pre-op 02/04/11 1706 02/05/11 1200   02/02/11 0015   ciprofloxacin (CIPRO) IVPB 400 mg  Status:  Discontinued        400 mg 200 mL/hr over 60 Minutes Intravenous Every 12 hours 02/01/11 1314 02/05/11 2128   01/29/11 1200   ciprofloxacin (CIPRO) IVPB 400 mg  Status:  Discontinued        400 mg 200 mL/hr over 60 Minutes Intravenous Every 24 hours  01/28/11 1538 02/01/11 1314   01/28/11 0945   ciprofloxacin (CIPRO) IVPB 400 mg  Status:  Discontinued        400 mg 200 mL/hr over 60 Minutes Intravenous Every 12 hours 01/28/11 0933 01/28/11 1537   01/28/11 0945   metroNIDAZOLE (FLAGYL) IVPB 500 mg  Status:  Discontinued        500 mg 100 mL/hr over 60 Minutes Intravenous Every 8 hours 01/28/11 0933 02/03/11 0913           Assessment/Plan  1. S/p ex lap with extensive LOA, SBR 2. Post op ileus 3. PCM/TNA  Plan:  Will try to clamp NGT today, if tolerates may d/c in AM. Cont dressing changes. Cont ambulation  hgb stable   LOS: 16 days    Priscilla [name] 02/12/2011

## 2011-02-12 NOTE — Progress Notes (Signed)
The patient was seen, examined and PA note reviewed and data reviewed.  I agree with the plan of action. 

## 2011-02-12 NOTE — Progress Notes (Signed)
Physical Therapy Treatment Patient Details Name: Priscilla [name] MRN: [id number] DOB: Feb 27, 1931 Today's Date: 02/12/2011  PT Assessment/Plan  PT - Assessment/Plan Comments on Treatment Session: continued increased pain with bed mobility, not quite independent yet with sidelie to/from sit PT Plan: Discharge plan remains appropriate PT Frequency: Min 3X/week Follow Up Recommendations: Home health PT Equipment Recommended: None recommended by PT PT Goals  Acute Rehab PT Goals PT Goal: Rolling Supine to Left Side - Progress: Progressing toward goal PT Goal: Supine/Side to Sit - Progress: Progressing toward goal PT Goal: Sit to Supine/Side - Progress: Other (comment) PT Transfer Goal: Sit to Stand/Stand to Sit - Progress: Progressing toward goal PT Goal: Ambulate - Progress: Progressing toward goal PT Goal: Up/Down Stairs - Progress: Other (comment)  PT Treatment Precautions/Restrictions  Precautions Precautions: Fall Precaution Comments: Also NG tube, NPO Restrictions Weight Bearing Restrictions: No Mobility (including Balance) Bed Mobility Rolling Left: 6: Modified independent (Device/Increase time) (with rail) Rolling Left Details (indicate cue type and reason): cues for logroll Left Sidelying to Sit: 5: Supervision;With rails (cues for logroll technique for less pain with bed mobility) Left Sidelying to Sit Details (indicate cue type and reason): increased pain Sitting - Scoot to Edge of Bed: 6: Modified independent (Device/Increase time) Transfers Sit to Stand: 5: Supervision;From bed;From chair/3-in-1 Sit to Stand Details (indicate cue type and reason): cues for hand placement Stand to Sit: 5: Supervision;To chair/3-in-1 Stand to Sit Details: cues to control descent rate stand to sit Ambulation/Gait Ambulation/Gait Assistance: 5: Supervision Ambulation Distance (Feet): 300 Feet Assistive device: Rolling walker Gait Pattern: Decreased stride length;Trunk flexed Stairs:  No    Exercise    End of Session PT - End of Session Equipment Utilized During Treatment: Gait belt Activity Tolerance: Patient tolerated treatment well Patient left: in chair;with call bell in reach Nurse Communication: Mobility status for ambulation General Behavior During Session: [facility] for tasks performed Cognition: [facility] for tasks performed  Priscilla [name] 02/12/2011, 2:35 PM [place], [place] [phone]

## 2011-02-13 LAB — GLUCOSE, CAPILLARY
Glucose-Capillary: 116 mg/dL — ABNORMAL HIGH (ref 70–99)
Glucose-Capillary: 109 mg/dL — ABNORMAL HIGH (ref 70–99)
Glucose-Capillary: 106 mg/dL — ABNORMAL HIGH (ref 70–99)

## 2011-02-13 LAB — COMPREHENSIVE METABOLIC PANEL
ALT: 9 U/L (ref 0–35)
BUN: 13 mg/dL (ref 6–23)
CO2: 25 mEq/L (ref 19–32)
Calcium: 8.1 mg/dL — ABNORMAL LOW (ref 8.4–10.5)
Creatinine, Ser: 0.56 mg/dL (ref 0.50–1.10)
GFR calc Af Amer: >90 mL/min (ref >90)
GFR calc non Af Amer: 87 mL/min — ABNORMAL LOW (ref >90)
Glucose, Bld: 115 mg/dL — ABNORMAL HIGH (ref 70–99)
Total Protein: 5.4 g/dL — ABNORMAL LOW (ref 6.0–8.3)

## 2011-02-13 LAB — MAGNESIUM: Magnesium: 1.7 mg/dL (ref 1.5–2.5)

## 2011-02-13 MED ORDER — CLINIMIX E/DEXTROSE (5/15) 5 % IV SOLN
INTRAVENOUS | Status: AC
  Administered 2011-02-13: 18:00:00 via INTRAVENOUS
  Filled 2011-02-13: qty 2000

## 2011-02-13 MED ORDER — FAT EMULSION 20 % IV EMUL
240.0000 mL | INTRAVENOUS | Status: AC
  Administered 2011-02-13: 240 mL via INTRAVENOUS
  Filled 2011-02-13: qty 250

## 2011-02-13 NOTE — Progress Notes (Signed)
PARENTERAL NUTRITION CONSULT NOTE - FOLLOW UP  Pharmacy Consult for TPN Indication: S/p ex lap with extensive LOA, SBR with post-op ileus   Allergies  Allergen Reactions  . Morphine And Related Nausea And Vomiting  . Lisinopril Cough    Patient Measurements: Height: 5\' 2"  (157.5 cm) Weight: 155 lb 4 oz (70.42 kg) IBW/kg (Calculated) : 50.1  Adjusted Body Weight:   Usual Weight:    Vital Signs: Temp: 98.5 F (36.9 C) (11/22 0515) BP: 140/47 mmHg (11/22 0515) Pulse Rate: 93  (11/22 0515) Intake/Output from previous day: 11/21 0701 - 11/22 0700 In: 2639 [P.O.:60; I.V.:483; IV Piggyback:100; TPN:1996] Out: 1650 [Urine:1650] Intake/Output from this shift:    Labs:  [facility] 02/12/11 0550 02/11/11 0510  WBC 4.9 5.7  HGB 7.4* 7.5*  HCT 22.5* 22.6*  PLT 300 278  APTT -- --  INR -- --     Basename 02/13/11 0600 02/12/11 0550 02/11/11 0510  NA 134* 135 131*  K 3.8 3.8 3.5  CL 101 102 95*  CO2 25 27 29   GLUCOSE 115* 117* 120*  BUN 13 15 16   CREATININE 0.56 0.61 0.65  LABCREA -- -- --  CREAT24HRUR -- -- --  CALCIUM 8.1* 7.8* 7.7*  MG 1.7 -- --  PHOS 2.9 -- --  PROT 5.4* -- --  ALBUMIN 1.7* -- --  AST 20 -- --  ALT 9 -- --  ALKPHOS 193* -- --  BILITOT 0.2* -- --  BILIDIR -- -- --  IBILI -- -- --  PREALBUMIN -- -- --  CHOLHDL -- -- --  CHOL -- -- --   Estimated Creatinine Clearance: 53.2 ml/min (by C-G formula based on Cr of 0.56).    Basename 02/13/11 0738 02/13/11 0359 02/12/11 2358  GLUCAP 109* 121* 116*    Medications:  Scheduled:    . antiseptic oral rinse  15 mL Mouth Rinse q12n4p  . bacitracin  1 application Topical BID  . chlorhexidine  15 mL Mouth Rinse BID  . ertapenem (INVANZ) IV  1 g Intravenous Q24H  . heparin  5,000 Units Subcutaneous Q8H  . HYDROmorphone PCA 0.3 mg/mL   Intravenous Q4H  . insulin aspart  0-9 Units Subcutaneous Q4H  . pantoprazole (PROTONIX) IV  40 mg Intravenous QHS    Insulin Requirements in the past 24 hours:    2 units SSI  Current Nutrition:  TPN/lipids: 1518 kcal, 90g protein  Assessment: S/p ex lap with extensive LOA, SBR with post-op ileus. Plan to d/c NG today and start sips of clear liquids.  Electrolytes: ok today Glucoses: Well controlled 96-121 on SSI   Nutritional Goals:  1400-1500 kCal, 87-102 grams of protein per day  Plan:  No change to TPN today. If tolerates clears, may be able to start weaning TPN in a day or two.  Misty Stanley Stillinger 02/13/2011,11:02 AM

## 2011-02-13 NOTE — Progress Notes (Signed)
8 Days Post-Op  Subjective: Tolerated NG clamped X 24hrs. No N/V. Passing flatus Pain control good, limited use of PCA  Objective: Vital signs in last 24 hours: Temp:  [98.2 F (36.8 C)-98.8 F (37.1 C)] 98.5 F (36.9 C) (11/22 0515) Pulse Rate:  [93-96] 93  (11/22 0515) Resp:  [16-19] 19  (11/22 0515) BP: (129-140)/(47-50) 140/47 mmHg (11/22 0515) SpO2:  [94 %-97 %] 96 % (11/22 0515) Last BM Date: 02/12/11  Intake/Output this shift:    Physical Exam: Abdomen: soft, wound clean. ND, NT Drain intact, serous output.  Labs: CBC  Basename 02/12/11 0550 02/11/11 0510  WBC 4.9 5.7  HGB 7.4* 7.5*  HCT 22.5* 22.6*  PLT 300 278   BMET  Basename 02/12/11 0550 02/11/11 0510  NA 135 131*  K 3.8 3.5  CL 102 95*  CO2 27 29  GLUCOSE 117* 120*  BUN 15 16  CREATININE 0.61 0.65  CALCIUM 7.8* 7.7*   LFT No results found for this basename: PROT,ALBUMIN,AST,ALT,ALKPHOS,BILITOT,BILIDIR,IBILI,LIPASE in the last 72 hours PT/INR No results found for this basename: LABPROT:2,INR:2 in the last 72 hours ABG No results found for this basename: PHART:2,PCO2:2,PO2:2,HCO3:2 in the last 72 hours  Studies/Results: No results found.  Assessment: Principal Problem:  *Enteritis Active Problems:  Acute renal failure  Dehydration  Diarrhea  Hypotension  Hypokalemia  SBO (small bowel obstruction)  Anemia due to blood loss, acute  s/p Procedure(s): EXPLORATORY LAPAROTOMY SMALL BOWEL RESECTION Plan: DC NG Increase activity Start sips of clears. Cont TPN, check labs per protocol.  LOS: 17 days    Nekisha Mcdiarmid J 02/13/2011

## 2011-02-14 ENCOUNTER — Encounter (HOSPITAL_COMMUNITY): Payer: Self-pay | Admitting: Gastroenterology

## 2011-02-14 LAB — GLUCOSE, CAPILLARY
Glucose-Capillary: 114 mg/dL — ABNORMAL HIGH (ref 70–99)
Glucose-Capillary: 120 mg/dL — ABNORMAL HIGH (ref 70–99)
Glucose-Capillary: 190 mg/dL — ABNORMAL HIGH (ref 70–99)

## 2011-02-14 MED ORDER — ASPIRIN EC 81 MG PO TBEC
81.0000 mg | DELAYED_RELEASE_TABLET | Freq: Every day | ORAL | Status: DC
  Administered 2011-02-14 – 2011-02-23 (×10): 81 mg via ORAL
  Filled 2011-02-14 (×11): qty 1

## 2011-02-14 MED ORDER — TRACE MINERALS CR-CU-MN-SE-ZN 10-1000-500-60 MCG/ML IV SOLN
INTRAVENOUS | Status: AC
  Administered 2011-02-14: 19:00:00 via INTRAVENOUS
  Filled 2011-02-14: qty 2000

## 2011-02-14 MED ORDER — TRAMADOL-ACETAMINOPHEN 37.5-325 MG PO TABS
1.0000 | ORAL_TABLET | Freq: Four times a day (QID) | ORAL | Status: DC | PRN
  Administered 2011-02-14 – 2011-02-16 (×4): 2 via ORAL
  Administered 2011-02-16 – 2011-02-17 (×2): 1 via ORAL
  Filled 2011-02-14 (×6): qty 2

## 2011-02-14 MED ORDER — ATENOLOL 50 MG PO TABS
50.0000 mg | ORAL_TABLET | Freq: Two times a day (BID) | ORAL | Status: DC
  Administered 2011-02-14 – 2011-02-23 (×17): 50 mg via ORAL
  Filled 2011-02-14 (×20): qty 1

## 2011-02-14 MED ORDER — HYDROCHLOROTHIAZIDE 12.5 MG PO CAPS
12.5000 mg | ORAL_CAPSULE | Freq: Every day | ORAL | Status: DC
  Administered 2011-02-14 – 2011-02-20 (×7): 12.5 mg via ORAL
  Filled 2011-02-14 (×8): qty 1

## 2011-02-14 MED ORDER — LOSARTAN POTASSIUM 50 MG PO TABS
100.0000 mg | ORAL_TABLET | Freq: Every day | ORAL | Status: DC
  Administered 2011-02-14 – 2011-02-23 (×10): 100 mg via ORAL
  Filled 2011-02-14 (×10): qty 2

## 2011-02-14 MED ORDER — LOSARTAN POTASSIUM 50 MG PO TABS: 100.0000 mg | ORAL_TABLET | Freq: Every day | ORAL | Status: DC

## 2011-02-14 MED ORDER — PANTOPRAZOLE SODIUM 40 MG PO TBEC
40.0000 mg | DELAYED_RELEASE_TABLET | Freq: Every day | ORAL | Status: DC
  Administered 2011-02-14 – 2011-02-17 (×4): 40 mg via ORAL
  Filled 2011-02-14 (×3): qty 1

## 2011-02-14 MED ORDER — ASPIRIN 81 MG PO TABS: 81.0000 mg | ORAL_TABLET | Freq: Every day | ORAL | Status: DC

## 2011-02-14 MED ORDER — FAT EMULSION 20 % IV EMUL
240.0000 mL | INTRAVENOUS | Status: AC
  Administered 2011-02-14: 240 mL via INTRAVENOUS
  Filled 2011-02-14: qty 250

## 2011-02-14 NOTE — Progress Notes (Signed)
Physical Therapy Treatment Patient Details Name: DALILAH CURLIN MRN: [id number] DOB: [birth year] Today's Date: 02/14/2011  PT Assessment/Plan  PT - Assessment/Plan Comments on Treatment Session: Pt doing well however still fatigued with activity. PT will continue to increase endurance and decrease risk for falls.  PT Plan: Discharge plan remains appropriate PT Frequency: Min 3X/week Follow Up Recommendations: Home health PT Equipment Recommended: None recommended by PT PT Goals  Acute Rehab PT Goals Pt will Transfer Sit to Stand/Stand to Sit: with modified independence PT Transfer Goal: Sit to Stand/Stand to Sit - Progress: Progressing toward goal Pt will Ambulate: >150 feet;with least restrictive assistive device;with modified independence PT Goal: Ambulate - Progress: Progressing toward goal PT Goal: Up/Down Stairs - Progress: Progressing toward goal  PT Treatment Precautions/Restrictions  Precautions Precautions: Fall Precaution Comments: Also NG tube, NPO Restrictions Weight Bearing Restrictions: No Mobility (including Balance) Bed Mobility Bed Mobility: No (pt seated at start) Transfers Sit to Stand: 5: Supervision;From chair/3-in-1;With armrests Sit to Stand Details (indicate cue type and reason): Verbal cues for hand placement and control of descent Stand to Sit: 5: Supervision;To chair/3-in-1 Stand to Sit Details: Decreased control of descent - verbal cues to slow speed Ambulation/Gait Ambulation/Gait: Yes Ambulation/Gait Assistance: 5: Supervision Ambulation/Gait Assistance Details (indicate cue type and reason): Verbal cues to monitor fatigue, cues for posture.  Ambulation Distance (Feet): 225 Feet Assistive device: Rolling walker Gait Pattern: Decreased stride length;Trunk flexed (some decr. stability with dynamic balance challenges) Stairs: No  Posture/Postural Control Posture/Postural Control: No significant limitations Balance Balance Assessed: Yes High  Level Balance High Level Balance Activites: Head turns High Level Balance Comments: Vertical and horizontal head movements with visual scanning. Practiced speed changes and fast gait speed as tolerated.  Exercise  General Exercises - Lower Extremity Ankle Circles/Pumps: AROM;Both;20 reps;Seated Long Arc Quad: AROM;20 reps;Both;Seated;Strengthening (manually resisted (2 set of 10)) Heel Slides: AROM;Strengthening;Both;20 reps;Seated (manually resisted  - (2 set of 10 each)) Hip Flexion/Marching:  (20 reps) Other Exercises Other Exercises: Sit <-> stand performed 3 x for strengthening.  End of Session PT - End of Session Equipment Utilized During Treatment: Gait belt Activity Tolerance: Patient tolerated treatment well Patient left: in chair;with call bell in reach Nurse Communication: Mobility status for ambulation General Behavior During Session: [facility] for tasks performed Cognition: [facility] for tasks performed  Sherie Don 02/14/2011, 12:20 PM  Sherie Don) Carleene Mains PT, DPT Acute Rehabilitation [phone]

## 2011-02-14 NOTE — Progress Notes (Signed)
PARENTERAL NUTRITION CONSULT NOTE - FOLLOW UP  Pharmacy Consult for TPN Indication: S/p ex lap with extensive LOA, SBR with post-op ileus    Allergies  Allergen Reactions  . Morphine And Related Nausea And Vomiting  . Lisinopril Cough    Patient Measurements: Height: 5\' 2"  (157.5 cm) Weight: 157 lb 6.5 oz (71.4 kg) IBW/kg (Calculated) : 50.1  Adjusted Body Weight:   Usual Weight:    Vital Signs: Temp: 98.4 F (36.9 C) (11/23 0600) Temp src: Oral (11/23 0600) BP: 134/58 mmHg (11/23 0600) Pulse Rate: 98  (11/23 0600) Intake/Output from previous day: 11/22 0701 - 11/23 0700 In: 3090 [P.O.:720; I.V.:463; TPN:1907] Out: 2171 [Urine:2150; Drains:20; Stool:1] Intake/Output from this shift: Total I/O In: -  Out: 250 [Urine:250]  Labs:  [facility] 02/12/11 0550  WBC 4.9  HGB 7.4*  HCT 22.5*  PLT 300  APTT --  INR --     Basename 02/13/11 0600 02/12/11 0550  NA 134* 135  K 3.8 3.8  CL 101 102  CO2 25 27  GLUCOSE 115* 117*  BUN 13 15  CREATININE 0.56 0.61  LABCREA -- --  CREAT24HRUR -- --  CALCIUM 8.1* 7.8*  MG 1.7 --  PHOS 2.9 --  PROT 5.4* --  ALBUMIN 1.7* --  AST 20 --  ALT 9 --  ALKPHOS 193* --  BILITOT 0.2* --  BILIDIR -- --  IBILI -- --  PREALBUMIN -- --  CHOLHDL -- --  CHOL -- --   Estimated Creatinine Clearance: 53.6 ml/min (by C-G formula based on Cr of 0.56).    Basename 02/14/11 0800 02/14/11 0402 02/14/11 0005  GLUCAP 114* 190* 120*    Medications:  Scheduled:    . antiseptic oral rinse  15 mL Mouth Rinse q12n4p  . aspirin EC  81 mg Oral Daily  . atenolol  50 mg Oral BID  . bacitracin  1 application Topical BID  . chlorhexidine  15 mL Mouth Rinse BID  . ertapenem (INVANZ) IV  1 g Intravenous Q24H  . heparin  5,000 Units Subcutaneous Q8H  . hydrochlorothiazide  12.5 mg Oral Daily  . insulin aspart  0-9 Units Subcutaneous Q4H  . losartan  100 mg Oral Daily  . pantoprazole  40 mg Oral Q1200  . DISCONTD: aspirin  81 mg Oral Daily    . DISCONTD: HYDROmorphone PCA 0.3 mg/mL   Intravenous Q4H  . DISCONTD: losartan  100 mg Oral Daily  . DISCONTD: pantoprazole (PROTONIX) IV  40 mg Intravenous QHS   Infusions:    . dextrose 5 % and 0.9 % NaCl with KCl 20 mEq/L 20 mL/hr at 02/12/11 1530  . TPN (CLINIMIX) +/- additives 75 mL/hr at 02/12/11 1831   And  . fat emulsion 240 mL (02/12/11 1832)  . TPN (CLINIMIX) +/- additives 75 mL/hr at 02/13/11 1730   And  . fat emulsion 240 mL (02/13/11 1730)    Insulin Requirements in the past 24 hours:     Current Nutrition:  TPN/lipids: 1518 kcal, 90g protein   Assessment:  S/p ex lap with extensive LOA, SBR with post-op ileus. Stool and flatus noted. Advance diet Electrolytes: none new today Glucose: Well controlled 106-120 with one outlier 190 this am.  Nutritional Goals:  1400-1500 kCal, 87-102 grams of protein per day  Plan:  Continue same TPN today. Hopefully wean off tomorrow. Full liquid diet.  Merilynn Finland, [employer] 02/14/2011,9:25 AM

## 2011-02-14 NOTE — Progress Notes (Signed)
9 Days Post-Op  Subjective: Pt ok. No N/V with NG out. Tol sips well. Few BMs and plenty flatus. Minimal pain, not using PCA  Objective: Vital signs in last 24 hours: Temp:  [98.1 F (36.7 C)-98.8 F (37.1 C)] 98.4 F (36.9 C) (11/23 0600) Pulse Rate:  [98-107] 98  (11/23 0600) Resp:  [18-20] 19  (11/23 0600) BP: (134-148)/(56-61) 134/58 mmHg (11/23 0600) SpO2:  [95 %-100 %] 98 % (11/23 0600) Weight:  [157 lb 6.5 oz (71.4 kg)] 157 lb 6.5 oz (71.4 kg) (11/23 0600) Last BM Date: 02/13/11  Intake/Output this shift: Total I/O In: -  Out: 250 [Urine:250]  Physical Exam: Abdomen: wound clean, minimal drainage, no odor. Soft, ND, NT otherwise.  Labs: CBC  Basename 02/12/11 0550  WBC 4.9  HGB 7.4*  HCT 22.5*  PLT 300   BMET  Basename 02/13/11 0600 02/12/11 0550  NA 134* 135  K 3.8 3.8  CL 101 102  CO2 25 27  GLUCOSE 115* 117*  BUN 13 15  CREATININE 0.56 0.61  CALCIUM 8.1* 7.8*   LFT  Basename 02/13/11 0600  PROT 5.4*  ALBUMIN 1.7*  AST 20  ALT 9  ALKPHOS 193*  BILITOT 0.2*  BILIDIR --  IBILI --  LIPASE --   PT/INR No results found for this basename: LABPROT:2,INR:2 in the last 72 hours ABG No results found for this basename: PHART:2,PCO2:2,PO2:2,HCO3:2 in the last 72 hours  Studies/Results: No results found.  Assessment: Principal Problem:  *Enteritis Active Problems:  Acute renal failure  Dehydration  Diarrhea  Hypotension  Hypokalemia  SBO (small bowel obstruction)  Anemia due to blood loss, acute  s/p Procedure(s): EXPLORATORY LAPAROTOMY SMALL BOWEL RESECTION Plan: Advance diet today. DC PCA Wound packing.  LOS: 18 days    Surena Welge J 02/14/2011

## 2011-02-15 LAB — GLUCOSE, CAPILLARY
Glucose-Capillary: 105 mg/dL — ABNORMAL HIGH (ref 70–99)
Glucose-Capillary: 115 mg/dL — ABNORMAL HIGH (ref 70–99)
Glucose-Capillary: 105 mg/dL — ABNORMAL HIGH (ref 70–99)

## 2011-02-15 NOTE — Progress Notes (Signed)
10 Days Post-Op  Subjective: Pt ok. Tol diet pretty well. No N/V +BM and flatus  Objective: Vital signs in last 24 hours: Temp:  [97.7 F (36.5 C)-98.1 F (36.7 C)] 97.7 F (36.5 C) (11/24 0600) Pulse Rate:  [85-89] 85  (11/24 0600) Resp:  [16-18] 16  (11/24 0600) BP: (123-133)/(53-89) 133/54 mmHg (11/24 0600) SpO2:  [95 %-97 %] 97 % (11/24 0600) Last BM Date: 02/14/11  Intake/Output this shift:    Physical Exam: Abdomen: soft, ND. Mildly tender Wound looks good, pink healthy. Steristripped to approximate edges today. Drain removed  Labs: CBC No results found for this basename: WBC:2,HGB:2,HCT:2,PLT:2 in the last 72 hours BMET  Basename 02/13/11 0600  NA 134*  K 3.8  CL 101  CO2 25  GLUCOSE 115*  BUN 13  CREATININE 0.56  CALCIUM 8.1*   LFT  Basename 02/13/11 0600  PROT 5.4*  ALBUMIN 1.7*  AST 20  ALT 9  ALKPHOS 193*  BILITOT 0.2*  BILIDIR --  IBILI --  LIPASE --   PT/INR No results found for this basename: LABPROT:2,INR:2 in the last 72 hours ABG No results found for this basename: PHART:2,PCO2:2,PO2:2,HCO3:2 in the last 72 hours  Studies/Results: No results found.  Assessment: Principal Problem:  *Enteritis Active Problems:  Acute renal failure  Dehydration  Diarrhea  Hypotension  Hypokalemia  SBO (small bowel obstruction)  Anemia due to blood loss, acute  s/p Procedure(s): EXPLORATORY LAPAROTOMY SMALL BOWEL RESECTION Plan: Regular diet. Wean/DC TPN OOB Planning DC tomorrow.  LOS: 19 days    Priscilla [name] 02/15/2011

## 2011-02-16 MED ORDER — AMOXICILLIN-POT CLAVULANATE 875-125 MG PO TABS
1.0000 | ORAL_TABLET | Freq: Two times a day (BID) | ORAL | Status: DC
  Administered 2011-02-16 (×2): 1 via ORAL
  Filled 2011-02-16 (×5): qty 1

## 2011-02-16 NOTE — Progress Notes (Signed)
Midline abd wound had moderate amount of thin tan drainage pooled in umbilical area and dressing ws saturated when changed at bedtime.

## 2011-02-16 NOTE — Progress Notes (Signed)
11 Days Post-Op  Subjective: Pt ok. C/o sore spot near abd wound. Tan drainage from wd too. Appetite ok. Bowels moving  Objective: Vital signs in last 24 hours: Temp:  [98.4 F (36.9 C)-98.7 F (37.1 C)] 98.5 F (36.9 C) (11/25 0600) Pulse Rate:  [62-96] 62  (11/25 0600) Resp:  [18] 18  (11/25 0600) BP: (106-118)/(43-51) 106/48 mmHg (11/25 0600) SpO2:  [98 %-100 %] 98 % (11/25 0600) Last BM Date: 02/15/11  Intake/Output this shift:    Physical Exam: Abdomen soft. Wound with tan murky drainage coming from wound, steristrips removed and wound reopened. Small pocket entered allowing better drainage of pus. Wd repacked after cx obtained. Fascia intact.  Labs: CBC No results found for this basename: WBC:2,HGB:2,HCT:2,PLT:2 in the last 72 hours BMET No results found for this basename: NA:2,K:2,CL:2,CO2:2,GLUCOSE:2,BUN:2,CREATININE:2,CALCIUM:2 in the last 72 hours LFT No results found for this basename: PROT,ALBUMIN,AST,ALT,ALKPHOS,BILITOT,BILIDIR,IBILI,LIPASE in the last 72 hours PT/INR No results found for this basename: LABPROT:2,INR:2 in the last 72 hours ABG No results found for this basename: PHART:2,PCO2:2,PO2:2,HCO3:2 in the last 72 hours  Studies/Results: No results found.  Assessment: Principal Problem:  *Enteritis Active Problems:  Acute renal failure  Dehydration  Diarrhea  Hypotension  Hypokalemia  SBO (small bowel obstruction)  Anemia due to blood loss, acute  s/p Procedure(s): EXPLORATORY LAPAROTOMY SMALL BOWEL RESECTION  Plan: Begin repacking wound 3-4 times daily. CHeck culture, start po Augmentin  LOS: 20 days    Xaden Kaufman J 02/16/2011

## 2011-02-17 LAB — DIFFERENTIAL
Basophils Absolute: 0.1 10*3/uL (ref 0.0–0.1)
Basophils Relative: 1 % (ref 0–1)
Eosinophils Absolute: 0.6 10*3/uL (ref 0.0–0.7)
Neutro Abs: 4.6 10*3/uL (ref 1.7–7.7)
Neutrophils Relative %: 59 % (ref 43–77)

## 2011-02-17 LAB — COMPREHENSIVE METABOLIC PANEL
Albumin: 2.2 g/dL — ABNORMAL LOW (ref 3.5–5.2)
BUN: 19 mg/dL (ref 6–23)
Chloride: 104 mEq/L (ref 96–112)
Creatinine, Ser: 0.93 mg/dL (ref 0.50–1.10)
GFR calc Af Amer: 66 mL/min — ABNORMAL LOW (ref >90)
GFR calc non Af Amer: 57 mL/min — ABNORMAL LOW (ref >90)
Glucose, Bld: 101 mg/dL — ABNORMAL HIGH (ref 70–99)
Total Bilirubin: 0.3 mg/dL (ref 0.3–1.2)

## 2011-02-17 LAB — PREALBUMIN: Prealbumin: 13 mg/dL — ABNORMAL LOW (ref 17.0–34.0)

## 2011-02-17 LAB — TRIGLYCERIDES: Triglycerides: 178 mg/dL — ABNORMAL HIGH (ref <150)

## 2011-02-17 LAB — CBC
Hemoglobin: 8.1 g/dL — ABNORMAL LOW (ref 12.0–15.0)
MCHC: 32.1 g/dL (ref 30.0–36.0)
Platelets: 605 10*3/uL — ABNORMAL HIGH (ref 150–400)
RDW: 13.8 % (ref 11.5–15.5)

## 2011-02-17 LAB — PHOSPHORUS: Phosphorus: 3.1 mg/dL (ref 2.3–4.6)

## 2011-02-17 LAB — CHOLESTEROL, TOTAL: Cholesterol: 117 mg/dL (ref 0–200)

## 2011-02-17 LAB — MAGNESIUM: Magnesium: 1.9 mg/dL (ref 1.5–2.5)

## 2011-02-17 MED ORDER — METRONIDAZOLE 500 MG PO TABS
500.0000 mg | ORAL_TABLET | Freq: Three times a day (TID) | ORAL | Status: DC
  Administered 2011-02-17 – 2011-02-23 (×19): 500 mg via ORAL
  Filled 2011-02-17 (×22): qty 1

## 2011-02-17 NOTE — Progress Notes (Signed)
Pt with order for [facility] for dressing changes. Pt given choice of HH and reported that she had used [facility] in the past and wishes to remain with them for this service as well. Arrangements made. Pt not d/c today as she was vomiting and has suspected ileus. Face to Face completed and sent to Dr. Ezzard Standing for signature.

## 2011-02-17 NOTE — Progress Notes (Signed)
Physical Therapy Treatment Patient Details Name: Priscilla [name] MRN: [id number] DOB: 1930/07/17 Today's Date: 02/17/2011  PT Assessment/Plan  PT - Assessment/Plan Comments on Treatment Session: Pt doing much better today despite nausea and h/o diarrhea. Pt reports she has almost has her strength back. PT will continue to work with pt to return to prior level of function and decrease risk of falls with return home.  PT Plan: Discharge plan remains appropriate PT Frequency: Min 3X/week Follow Up Recommendations: Home health PT Equipment Recommended: None recommended by PT PT Goals  Acute Rehab PT Goals Pt will Roll Supine to Left Side: Independently PT Goal: Rolling Supine to Left Side - Progress: Progressing toward goal Pt will go Supine/Side to Sit: Independently PT Goal: Supine/Side to Sit - Progress: Progressing toward goal Pt will go Sit to Supine/Side: Independently PT Goal: Sit to Supine/Side - Progress: Progressing toward goal Pt will Transfer Sit to Stand/Stand to Sit: with modified independence PT Transfer Goal: Sit to Stand/Stand to Sit - Progress: Progressing toward goal Pt will Ambulate: >150 feet;with least restrictive assistive device;with modified independence PT Goal: Ambulate - Progress: Met Pt will Go Up / Down Stairs: 3-5 stairs;with least restrictive assistive device;with supervision PT Goal: Up/Down Stairs - Progress: Progressing toward goal  PT Treatment Precautions/Restrictions  Precautions Precautions: Fall Precaution Comments: Also NG tube, NPO Restrictions Weight Bearing Restrictions: No Mobility (including Balance) Bed Mobility Bed Mobility: No (pt seated at start) Rolling Left: 6: Modified independent (Device/Increase time);With rail (with rail) Rolling Left Details (indicate cue type and reason): Pt demonstrating log roll Left Sidelying to Sit: 6: Modified independent (Device/Increase time);With rails (cues for logroll technique for less pain with  bed mobility) Sitting - Scoot to Edge of Bed: 6: Modified independent (Device/Increase time) Transfers Transfers: Yes Sit to Stand: 5: Supervision;With armrests;From bed;From chair/3-in-1 Sit to Stand Details (indicate cue type and reason): supervision for safety only secondary pt reported weakness Stand to Sit: 5: Supervision;To chair/3-in-1;To bed Stand to Sit Details: Verbal cues for control of descent Ambulation/Gait Ambulation/Gait: Yes Ambulation/Gait Assistance: 6: Modified independent (Device/Increase time) Ambulation Distance (Feet): 400 Feet Assistive device: Rolling walker Gait Pattern: Decreased stride length;Trunk flexed (some decr. stability with dynamic balance challenges) Stairs: No  Posture/Postural Control Posture/Postural Control: No significant limitations Balance Balance Assessed: Yes High Level Balance High Level Balance Activites: Head turns High Level Balance Comments: Vertical and horizontal head movements with visual scanning. Practiced speed changes and fast gait speed  Exercise  General Exercises - Lower Extremity Long Arc Quad: AROM;20 reps;Both;Seated;Strengthening (manually resisted (2 set of 10)) Heel Slides: AROM;Strengthening;Both;20 reps;Seated (manually resisted  - (2 set of 10 each)) Hip Flexion/Marching:  (20 reps) Heel Raises: AROM;Strengthening;Both;15 reps;Seated Other Exercises Other Exercises: Sit <-> stand performed 6 x for strengthening.  End of Session PT - End of Session Equipment Utilized During Treatment: Gait belt Activity Tolerance: Patient tolerated treatment well Patient left: with call bell in reach;in bed (per pt request) Nurse Communication: Mobility status for ambulation General Behavior During Session: [facility] for tasks performed Cognition: [facility] for tasks performed  Sherie Don 02/17/2011, 3:30 PM  Sherie Don) Carleene Mains PT, DPT Acute Rehabilitation [phone]

## 2011-02-17 NOTE — Progress Notes (Addendum)
12 Days Post-Op  Subjective: Pt ok. Not much appetite but forcing herself to eat/drink Continues with some loose stools but slowing a little Minimal pain, well controlled  Objective: Vital signs in last 24 hours: Temp:  [97.9 [name] (36.6 C)-98.9 [name] (37.2 C)] 97.9 [name] (36.6 C) (11/26 0500) Pulse Rate:  [56-90] 56  (11/26 0500) Resp:  [18] 18  (11/26 0500) BP: (111-120)/(51-57) 112/51 mmHg (11/26 0500) SpO2:  [97 %-100 %] 97 % (11/26 0500) Last BM Date: 02/16/11  Intake/Output this shift:    Physical Exam: BP 112/51  Pulse 56  Temp(Src) 97.9 [name] (36.6 C) (Oral)  Resp 18  Ht 5\' 2"  (1.575 m)  Wt 157 lb 6.5 oz (71.4 kg)  BMI 28.79 kg/m2  SpO2 97% Abdomen: soft, ND Wound much cleaner than yesterday. Pink edges, fascia intact, no odor   Labs: CBC  Basename 02/17/11 0500  WBC 7.9  HGB 8.1*  HCT 25.2*  PLT 605*   BMET  Basename 02/17/11 0500  NA 135  K 3.7  CL 104  CO2 22  GLUCOSE 101*  BUN 19  CREATININE 0.93  CALCIUM 9.0   LFT  Basename 02/17/11 0500  PROT 6.6  ALBUMIN 2.2*  AST 22  ALT 19  ALKPHOS 304*  BILITOT 0.3  BILIDIR --  IBILI --  LIPASE --   PT/INR No results found for this basename: LABPROT:2,INR:2 in the last 72 hours ABG No results found for this basename: PHART:2,PCO2:2,PO2:2,HCO3:2 in the last 72 hours  Studies/Results: No results found.  Assessment: Principal Problem:  *Enteritis Active Problems:  Acute renal failure  Dehydration  Diarrhea  Hypotension  Hypokalemia  SBO (small bowel obstruction)  Anemia due to blood loss, acute  C. Diff Positive  s/p Procedure(s): EXPLORATORY LAPAROTOMY, SMALL BOWEL RESECTION - 01/27/2011 - D. Blackman  Plan: DC Augmentin Begin po flagyl 500 tid Wound packing BID Encourage po intake   LOS: 21 days    Priscilla [name],Priscilla [name] 02/17/2011   Sore abdomen, but diarrhea better.  On isolation for C. Diff.  DN 05/19/2010.

## 2011-02-18 DIAGNOSIS — A0472 Enterocolitis due to Clostridium difficile, not specified as recurrent: Secondary | ICD-10-CM | POA: Diagnosis not present

## 2011-02-18 LAB — CBC
MCH: 29.1 pg (ref 26.0–34.0)
MCHC: 32.2 g/dL (ref 30.0–36.0)
RDW: 13.8 % (ref 11.5–15.5)

## 2011-02-18 NOTE — Progress Notes (Signed)
Physical Therapy Treatment Patient Details Name: Priscilla [name] MRN: [id number] DOB: 1931/03/11 Today's Date: 02/18/2011  PT Assessment/Plan  PT - Assessment/Plan Comments on Treatment Session: Pt. did very well today.  Was able to progress ambulation and walked 100 feet without RW in addition to 350 feet with RW.  Pt.'s and pt.'s daughter who was present at completion of session were very appreciative.  PT Plan: Discharge plan needs to be updated Follow Up Recommendations: 24 hour supervision/assistance PT Goals  Acute Rehab PT Goals PT Goal: Rolling Supine to Left Side - Progress: Other (comment) (Not addressed. ) PT Goal: Supine/Side to Sit - Progress: Met PT Goal: Sit to Supine/Side - Progress: Other (comment) (Not addressed. ) PT Transfer Goal: Sit to Stand/Stand to Sit - Progress: Met Pt will Ambulate: >150 feet;Independently PT Goal: Ambulate - Progress: Revised (modified due to lack of progress/goal met) PT Goal: Up/Down Stairs - Progress: Met  PT Treatment Precautions/Restrictions  Precautions Precautions: Other (comment) (Contact) Precaution Comments: Also NG tube, NPO Restrictions Weight Bearing Restrictions: No Mobility (including Balance) Bed Mobility Bed Mobility: Yes Supine to Sit: 7: Independent Transfers Transfers: Yes Sit to Stand: 7: Independent;From bed;From chair/3-in-1 Stand to Sit: 7: Independent;To chair/3-in-1 Stand to Sit Details: two trials.  Ambulation/Gait Ambulation/Gait: Yes Ambulation/Gait Assistance: 6: Modified independent (Device/Increase time) Ambulation/Gait Assistance Details (indicate cue type and reason): Pt. did ambulate 100 feet without RW at end of ambulation session.  Ambulation Distance (Feet): 450 Feet Assistive device: Rolling walker Gait Pattern: Within Functional Limits Stairs: Yes Stairs Assistance: 6: Modified independent (Device/Increase time) Stair Management Technique: Two rails Number of Stairs: 3  Height of  Stairs: 4     Exercise  General Exercises - Lower Extremity Long Arc Quad: AROM;Both;20 reps;Seated Hip Flexion/Marching: AROM;Both;Seated;Other reps (comment) (20) End of Session PT - End of Session Equipment Utilized During Treatment: Gait belt Activity Tolerance: Patient tolerated treatment well Patient left: in chair;with call bell in reach;with family/visitor present Nurse Communication: Mobility status for ambulation General Behavior During Session: [facility] for tasks performed Cognition: [facility] for tasks performed  Laney Pastor, SPT  02/18/2011, 5:23 PM

## 2011-02-18 NOTE — Progress Notes (Signed)
Seen and agree with SPT note Brittaney Beaulieu Tabor, PT [phone]  

## 2011-02-18 NOTE — Progress Notes (Addendum)
13 Days Post-Op  Subjective: Pt ok. Last bout of vomiting yesterday afternoos, since then able to tolerate some clears and po Flagyl. Diarrhea slowing, feeling some better  Objective: Vital signs in last 24 hours: Temp:  [98.4 F (36.9 C)-98.7 F (37.1 C)] 98.7 F (37.1 C) (11/27 0642) Pulse Rate:  [84-92] 84  (11/27 0642) Resp:  [16-18] 16  (11/27 0642) BP: (105-119)/(39-46) 115/46 mmHg (11/27 0642) SpO2:  [96 %-98 %] 96 % (11/27 0642) Last BM Date: 02/17/11  Intake/Output this shift: Total I/O In: -  Out: 1 [Urine:1]  Physical Exam: BP 115/46  Pulse 84  Temp(Src) 98.7 F (37.1 C) (Oral)  Resp 16  Ht 5\' 2"  (1.575 m)  Wt 157 lb 6.5 oz (71.4 kg)  BMI 28.79 kg/m2  SpO2 96% Abdomen: soft, mildly tender, ND Wound clean, decreased drainage, granulating edges, fascia still white.  Labs: CBC  Basename 02/18/11 0535 02/17/11 0500  WBC 6.8 7.9  HGB 7.6* 8.1*  HCT 23.6* 25.2*  PLT 550* 605*   BMET  Basename 02/17/11 0500  NA 135  K 3.7  CL 104  CO2 22  GLUCOSE 101*  BUN 19  CREATININE 0.93  CALCIUM 9.0   LFT  Basename 02/17/11 0500  PROT 6.6  ALBUMIN 2.2*  AST 22  ALT 19  ALKPHOS 304*  BILITOT 0.3  BILIDIR --  IBILI --  LIPASE --   PT/INR No results found for this basename: LABPROT:2,INR:2 in the last 72 hours ABG No results found for this basename: PHART:2,PCO2:2,PO2:2,HCO3:2 in the last 72 hours  Studies/Results: No results found.  Assessment: Principal Problem:  *Enteritis Active Problems:  Acute renal failure  Dehydration  Diarrhea  Hypotension  Hypokalemia  SBO (small bowel obstruction)  Anemia due to blood loss, acute  C. difficile colitis s/p Procedure(s): EXPLORATORY LAPAROTOMY SMALL BOWEL RESECTION  Plan: Advanced to clears Cont po flagyl Wound care  LOS: 22 days    BRUNING,KEVIN F 02/18/2011  Nauseated today.  Only taking limited liquids.  Anxious to get well enough to go home.  [facility] 02/18/2011

## 2011-02-19 MED ORDER — BOOST / RESOURCE BREEZE PO LIQD
1.0000 | Freq: Three times a day (TID) | ORAL | Status: DC
  Administered 2011-02-19 – 2011-02-20 (×2): 1 via ORAL

## 2011-02-19 NOTE — Progress Notes (Signed)
Discussed in the long length of stay meeting Vanette Noguchi Weeks 02/19/2011  

## 2011-02-19 NOTE — Progress Notes (Signed)
POD# 14  Assessment/Plan:   1.  EXPLORATORY LAPAROTOMY SMALL BOWEL RESECTION - 02/05/2011 Priscilla [name] Has open wound - may need some debridement, but not now.   2. C. difficile colitis (02/18/2011) on Flagyl. Nausea better, less frequent bowel movements.   3. Anemia due to blood loss, acute (02/09/2011)  4.  Hypertension.  5.  Acute renal failure, resolved.  6.  Rectal cancer - LAR - 12/24/2006 - [name].      LOS: 23 days   Subjective:  Doing better.  Sister in room visiting.  Objective:   Filed Vitals:   02/19/11 0700  BP: 129/45  Pulse: 86  Temp: 98.3 F (36.8 C)  Resp: 19     Intake/Output from previous day:  11/27 0701 - 11/28 0700 In: 1511 [P.O.:360; I.V.:1151] Out: 4 [Urine:3; Emesis/NG output:1]  Intake/Output this shift:  Total I/O In: 240 [P.O.:240] Out: -    Physical Exam:   General: WN WF, older, who is alert and generally healthy appearing.    HEENT: Normal. Pupils equal. Good dentition. .   Lungs: Clear   Abdomen: Soft.  BS present.   Wound: Open wound.  Dressings BID.  Some tissue at bottom of wound may need debridement later.  Will need to check daily.   Neurologic:  Grossly intact to motor and sensory function.   Psychiatric: Has normal mood and affect. Behavior is normal   Lab Results:    Basename 02/18/11 0535 02/17/11 0500  WBC 6.8 7.9  HGB 7.6* 8.1*  HCT 23.6* 25.2*  PLT 550* 605*    BMET   Basename 02/17/11 0500  NA 135  K 3.7  CL 104  CO2 22  GLUCOSE 101*  BUN 19  CREATININE 0.93  CALCIUM 9.0    PT/INR  No results found for this basename: LABPROT:2,INR:2 in the last 72 hours  ABG  No results found for this basename: PHART:2,PCO2:2,PO2:2,HCO3:2 in the last 72 hours   Studies/Results:  No results found.   Anti-infectives:   Anti-infectives     Start     Dose/Rate Route Frequency Ordered Stop   02/17/11 0900   metroNIDAZOLE (FLAGYL) tablet 500 mg        500 mg Oral 3 times per day 02/17/11 0858     02/16/11 1000    amoxicillin-clavulanate (AUGMENTIN) 875-125 MG per tablet 1 tablet  Status:  Discontinued        1 tablet Oral Every 12 hours 02/16/11 0831 02/17/11 0858   02/06/11 1500   ertapenem (INVANZ) 1 g in sodium chloride 0.9 % 50 mL IVPB  Status:  Discontinued        1 g 100 mL/hr over 30 Minutes Intravenous Every 24 hours 02/05/11 1526 02/15/11 0827   02/04/11 1706   ertapenem (INVANZ) 1 g in sodium chloride 0.9 % 50 mL IVPB        1 g 100 mL/hr over 30 Minutes Intravenous 60 min pre-op 02/04/11 1706 02/05/11 1200   02/02/11 0015   ciprofloxacin (CIPRO) IVPB 400 mg  Status:  Discontinued        400 mg 200 mL/hr over 60 Minutes Intravenous Every 12 hours 02/01/11 1314 02/05/11 2128   01/29/11 1200   ciprofloxacin (CIPRO) IVPB 400 mg  Status:  Discontinued        400 mg 200 mL/hr over 60 Minutes Intravenous Every 24 hours 01/28/11 1538 02/01/11 1314   01/28/11 0945   ciprofloxacin (CIPRO) IVPB 400 mg  Status:  Discontinued  400 mg 200 mL/hr over 60 Minutes Intravenous Every 12 hours 01/28/11 0933 01/28/11 1537   01/28/11 0945   metroNIDAZOLE (FLAGYL) IVPB 500 mg  Status:  Discontinued        500 mg 100 mL/hr over 60 Minutes Intravenous Every 8 hours 01/28/11 0933 02/03/11 0913           Ovidio Kin, MD, FACS Pager: [phone],   [facility] Office: [phone] 02/19/2011

## 2011-02-19 NOTE — Progress Notes (Signed)
Nutrition Follow Up  Diet:  Clear liquid IVF:  D5NS with KCL @75  ml/hr TPN d/c'd 11/24- reg diet at that time  Results for TALOR, DESROSIERS (MRN [id number]) as of 02/19/2011 13:14   Labs:  02/17/2011 05:00  Sodium  135  Potassium  3.7  Chloride  104  CO2  22  BUN  19  Creat  0.93  Calcium  9.0  GFR calc non Af Amer  57 (L)  GFR calc Af Amer  66 (L)  Glucose  101 (H)  Phosphorus  3.1  Magnesium  1.9  Alkaline Phosphatase  304 (H)  Albumin  2.2 (L)  AST  22  ALT  19  Total Protein  6.6  Total Bilirubin  0.3  Prealbumin  13.0 (L)  Cholesterol  117  Triglycerides  178 (H)   Weight:  157# stable  Tolerated clear liquid diet today.  Previously NV with po.  Nutrition dx:  Inadequate oral intake, ongoing Goal:  Diet advancement to solids and intake of >90% to meet estimated needs.  Plan: Resource Fruit Beverage tid Monitor diet advancement/tolerance [place] Ree [phone]

## 2011-02-20 LAB — COMPREHENSIVE METABOLIC PANEL
ALT: 11 U/L (ref 0–35)
AST: 16 U/L (ref 0–37)
Albumin: 1.9 g/dL — ABNORMAL LOW (ref 3.5–5.2)
Alkaline Phosphatase: 210 U/L — ABNORMAL HIGH (ref 39–117)
BUN: 4 mg/dL — ABNORMAL LOW (ref 6–23)
Chloride: 108 mEq/L (ref 96–112)
Potassium: 3.3 mEq/L — ABNORMAL LOW (ref 3.5–5.1)
Total Bilirubin: 0.2 mg/dL — ABNORMAL LOW (ref 0.3–1.2)

## 2011-02-20 LAB — WOUND CULTURE

## 2011-02-20 LAB — MAGNESIUM: Magnesium: 1.4 mg/dL — ABNORMAL LOW (ref 1.5–2.5)

## 2011-02-20 MED ORDER — HYDROCHLOROTHIAZIDE 12.5 MG PO CAPS
25.0000 mg | ORAL_CAPSULE | Freq: Every day | ORAL | Status: DC
  Administered 2011-02-21 – 2011-02-23 (×3): 25 mg via ORAL
  Filled 2011-02-20 (×3): qty 2

## 2011-02-20 MED ORDER — POTASSIUM CHLORIDE CRYS ER 20 MEQ PO TBCR
20.0000 meq | EXTENDED_RELEASE_TABLET | Freq: Two times a day (BID) | ORAL | Status: DC
  Administered 2011-02-20 – 2011-02-23 (×7): 20 meq via ORAL
  Filled 2011-02-20 (×8): qty 1

## 2011-02-20 MED ORDER — ENSURE CLINICAL ST REVIGOR PO LIQD
237.0000 mL | Freq: Three times a day (TID) | ORAL | Status: DC
  Administered 2011-02-22: 237 mL via ORAL
  Administered 2011-02-22: 08:00:00 via ORAL

## 2011-02-20 NOTE — Progress Notes (Signed)
15 Days Post-Op  Subjective: Nausea better, BM's fewer about 5 per day, a little more solid.  No significant pain from open abdomen.  Objective: Vital signs in last 24 hours: Temp:  [98.1 F (36.7 C)-98.3 F (36.8 C)] 98.3 F (36.8 C) (11/29 0600) Pulse Rate:  [74-87] 87  (11/29 0600) Resp:  [17-20] 17  (11/29 0600) BP: (126-133)/(52-64) 129/64 mmHg (11/29 0600) SpO2:  [96 %-98 %] 96 % (11/29 0600) Last BM Date: 02/19/11  Intake/Output from previous day: 11/28 0701 - 11/29 0700 In: 1875 [P.O.:720; I.V.:1155] Out: -  Intake/Output this shift:    General appearance: alert, cooperative and no distress GI: soft, non-tender; bowel sounds normal; no masses,  no organomegaly and open wound with wet to dry, Kerlix, Sides look good the base is white fibrous tissue  Lab Results:   [facility] 02/18/11 0535  WBC 6.8  HGB 7.6*  HCT 23.6*  PLT 550*   BMET  Basename 02/20/11 0550  NA 139  K 3.3*  CL 108  CO2 22  GLUCOSE 101*  BUN 4*  CREATININE 0.71  CALCIUM 8.1*   PT/INR No results found for this basename: LABPROT:2,INR:2 in the last 72 hours ABG No results found for this basename: PHART:2,PCO2:2,PO2:2,HCO3:2 in the last 72 hours  Studies/Results: No results found.  Anti-infectives: Anti-infectives     Start     Dose/Rate Route Frequency Ordered Stop   02/17/11 0900   metroNIDAZOLE (FLAGYL) tablet 500 mg        500 mg Oral 3 times per day 02/17/11 0858     02/16/11 1000   amoxicillin-clavulanate (AUGMENTIN) 875-125 MG per tablet 1 tablet  Status:  Discontinued        1 tablet Oral Every 12 hours 02/16/11 0831 02/17/11 0858   02/06/11 1500   ertapenem (INVANZ) 1 g in sodium chloride 0.9 % 50 mL IVPB  Status:  Discontinued        1 g 100 mL/hr over 30 Minutes Intravenous Every 24 hours 02/05/11 1526 02/15/11 0827   02/04/11 1706   ertapenem (INVANZ) 1 g in sodium chloride 0.9 % 50 mL IVPB        1 g 100 mL/hr over 30 Minutes Intravenous 60 min pre-op 02/04/11 1706  02/05/11 1200   02/02/11 0015   ciprofloxacin (CIPRO) IVPB 400 mg  Status:  Discontinued        400 mg 200 mL/hr over 60 Minutes Intravenous Every 12 hours 02/01/11 1314 02/05/11 2128   01/29/11 1200   ciprofloxacin (CIPRO) IVPB 400 mg  Status:  Discontinued        400 mg 200 mL/hr over 60 Minutes Intravenous Every 24 hours 01/28/11 1538 02/01/11 1314   01/28/11 0945   ciprofloxacin (CIPRO) IVPB 400 mg  Status:  Discontinued        400 mg 200 mL/hr over 60 Minutes Intravenous Every 12 hours 01/28/11 0933 01/28/11 1537   01/28/11 0945   metroNIDAZOLE (FLAGYL) IVPB 500 mg  Status:  Discontinued        500 mg 100 mL/hr over 60 Minutes Intravenous Every 8 hours 01/28/11 0933 02/03/11 0913          Assessment/Plan: s/p Procedure(s): EXPLORATORY LAPAROTOMY SMALL BOWEL RESECTION Advance diet Continue dressing changes, she has some swelling in her feet..Will increase Hctz, replaceK+.  Ms. Priscilla [name] feels like she has stepped backwards.  Some more nausea today.  More loose stools. Her albumin has slid some as she has not been eating well.  Hopefully, her appetite will improve.  DNewman  02/20/2011  LOS: 24 days    Priscilla [name],Priscilla [name] 02/20/2011 POD# 14  Assessment/Plan:   1.  EXPLORATORY LAPAROTOMY SMALL BOWEL RESECTION - 02/05/2011 Priscilla [name] Has open wound - may need some debridement, but not now.   2. C. difficile colitis (02/18/2011) on Flagyl. Nausea better, less frequent bowel movements.   3. Anemia due to blood loss, acute (02/09/2011)  4.  Hypertension.  5.  Acute renal failure, resolved.  6.  Rectal cancer - LAR - 12/24/2006 - Priscilla [name].      LOS: 24 days   Subjective:  Doing better.  Sister in room visiting.  Objective:   Filed Vitals:   02/20/11 0600  BP: 129/64  Pulse: 87  Temp: 98.3 F (36.8 C)  Resp: 17     Intake/Output from previous day:  11/28 0701 - 11/29 0700 In: 1875 [P.O.:720; I.V.:1155] Out: -   Intake/Output this shift:      Physical  Exam:   General: WN WF, older, who is alert and generally healthy appearing.    HEENT: Normal. Pupils equal. Good dentition. .   Lungs: Clear   Abdomen: Soft.  BS present.   Wound: Open wound.  Dressings BID.  Some tissue at bottom of wound may need debridement later.  Will need to check daily.   Neurologic:  Grossly intact to motor and sensory function.   Psychiatric: Has normal mood and affect. Behavior is normal   Lab Results:     Basename 02/18/11 0535  WBC 6.8  HGB 7.6*  HCT 23.6*  PLT 550*    BMET    Basename 02/20/11 0550  NA 139  K 3.3*  CL 108  CO2 22  GLUCOSE 101*  BUN 4*  CREATININE 0.71  CALCIUM 8.1*    PT/INR  No results found for this basename: LABPROT:2,INR:2 in the last 72 hours  ABG  No results found for this basename: PHART:2,PCO2:2,PO2:2,HCO3:2 in the last 72 hours   Studies/Results:  No results found.   Anti-infectives:   Anti-infectives     Start     Dose/Rate Route Frequency Ordered Stop   02/17/11 0900   metroNIDAZOLE (FLAGYL) tablet 500 mg        500 mg Oral 3 times per day 02/17/11 0858     02/16/11 1000   amoxicillin-clavulanate (AUGMENTIN) 875-125 MG per tablet 1 tablet  Status:  Discontinued        1 tablet Oral Every 12 hours 02/16/11 0831 02/17/11 0858   02/06/11 1500   ertapenem (INVANZ) 1 g in sodium chloride 0.9 % 50 mL IVPB  Status:  Discontinued        1 g 100 mL/hr over 30 Minutes Intravenous Every 24 hours 02/05/11 1526 02/15/11 0827   02/04/11 1706   ertapenem (INVANZ) 1 g in sodium chloride 0.9 % 50 mL IVPB        1 g 100 mL/hr over 30 Minutes Intravenous 60 min pre-op 02/04/11 1706 02/05/11 1200   02/02/11 0015   ciprofloxacin (CIPRO) IVPB 400 mg  Status:  Discontinued        400 mg 200 mL/hr over 60 Minutes Intravenous Every 12 hours 02/01/11 1314 02/05/11 2128   01/29/11 1200   ciprofloxacin (CIPRO) IVPB 400 mg  Status:  Discontinued        400 mg 200 mL/hr over 60 Minutes Intravenous Every 24 hours 01/28/11  1538 02/01/11 1314   01/28/11 0945   ciprofloxacin (CIPRO) IVPB 400  mg  Status:  Discontinued        400 mg 200 mL/hr over 60 Minutes Intravenous Every 12 hours 01/28/11 0933 01/28/11 1537   01/28/11 0945   metroNIDAZOLE (FLAGYL) IVPB 500 mg  Status:  Discontinued        500 mg 100 mL/hr over 60 Minutes Intravenous Every 8 hours 01/28/11 0933 02/03/11 0913           Ovidio Kin, MD, FACS Pager: [phone],   [facility] Office: [phone] 02/20/2011

## 2011-02-20 NOTE — Progress Notes (Signed)
Nutrition Follow-up/Consult for supplements to help with wound healing   Meds:      . aspirin EC  81 mg Oral Daily  . atenolol  50 mg Oral BID  . bacitracin  1 application Topical BID  . feeding supplement  1 Container Oral TID WC  . heparin  5,000 Units Subcutaneous Q8H  . hydrochlorothiazide  25 mg Oral Daily  . losartan  100 mg Oral Daily  . metroNIDAZOLE  500 mg Oral Q8H  . potassium chloride  20 mEq Oral BID  . DISCONTD: hydrochlorothiazide  12.5 mg Oral Daily     Labs:   CMP     Component Value Date/Time   NA 139 02/20/2011 0550   K 3.3* 02/20/2011 0550   CL 108 02/20/2011 0550   CO2 22 02/20/2011 0550   GLUCOSE 101* 02/20/2011 0550   BUN 4* 02/20/2011 0550   CREATININE 0.71 02/20/2011 0550   CALCIUM 8.1* 02/20/2011 0550   PROT 5.6* 02/20/2011 0550   ALBUMIN 1.9* 02/20/2011 0550   AST 16 02/20/2011 0550   ALT 11 02/20/2011 0550   ALKPHOS 210* 02/20/2011 0550   BILITOT 0.2* 02/20/2011 0550   GFRNONAA 80* 02/20/2011 0550   GFRAA >90 02/20/2011 0550     Intake/Output Summary (Last 24 hours) at 02/20/11 1500 Last data filed at 02/20/11 1415  Gross per 24 hour  Intake   2070 ml  Output      0 ml  Net   2070 ml    Wt. Status:  No new weight available--stable throughout this admission  Diet:  Advanced to Full Liquids today from clears.  Also receiving Resource Breeze tid.  Nutr dx:  Inadequate oral intake; ongoing  Goal:  Diet advancement to solids and intakes to meet >/=90% minimum estimated needs; not yet met.  Estimated nutrition needs:  1400-1500 kcal, 70-85 grams protein daily.    Intervention:    D/C Resource [employer] Clinical Strength tid (350 kcal, 13 g prot each)  Monitor:  Diet advancement/tolerance, adequacy of intakes, supplement acceptance   Pager #:  [phone]

## 2011-02-20 NOTE — Progress Notes (Signed)
Physical Therapy Treatment Patient Details Name: Priscilla [name] MRN: [id number] DOB: 03/18/1931 Today's Date: 02/20/2011  PT Assessment/Plan  PT - Assessment/Plan Comments on Treatment Session: Pt. did very well, was able to ambulate 500 feet with no assistive device.  Pt. with no complaints and no further acute PT needs.  Discussed continued ambulation with nursing to which pt.was very agreeable to.  PT Plan: Discharge plan remains appropriate Follow Up Recommendations: 24 hour supervision/assistance Equipment Recommended: None recommended by PT PT Goals  Acute Rehab PT Goals PT Goal: Rolling Supine to Left Side - Progress: Met PT Goal: Supine/Side to Sit - Progress: Met PT Goal: Sit to Supine/Side - Progress: Met PT Transfer Goal: Sit to Stand/Stand to Sit - Progress: Met PT Goal: Ambulate - Progress: Met PT Goal: Up/Down Stairs - Progress: Met  PT Treatment Precautions/Restrictions  Precautions Precautions: Other (comment) (Contact) Precaution Comments: Also NG tube, NPO Restrictions Weight Bearing Restrictions: No Mobility (including Balance) Bed Mobility Bed Mobility: Yes Supine to Sit: 7: Independent;HOB flat Sitting - Scoot to Edge of Bed: 7: Independent Sit to Supine - Right: 7: Independent;HOB flat Transfers Transfers: Yes Sit to Stand: 7: Independent;From bed;From chair/3-in-1 Stand to Sit: 7: Independent;To bed;To chair/3-in-1 Ambulation/Gait Ambulation/Gait: Yes Ambulation/Gait Assistance: 7: Independent Ambulation Distance (Feet): 500 Feet Assistive device: None Gait Pattern: Within Functional Limits Stairs: Yes Stairs Assistance: 6: Modified independent (Device/Increase time) Stair Management Technique: Two rails Number of Stairs: 2  Height of Stairs: 8     Exercise    End of Session PT - End of Session Equipment Utilized During Treatment: Gait belt Activity Tolerance: Patient tolerated treatment well Patient left: in bed;with call bell in  reach;with family/visitor present Nurse Communication: Mobility status for transfers;Mobility status for ambulation General Behavior During Session: [facility] for tasks performed Cognition: [facility] for tasks performed  Laney Pastor, SPT  02/20/2011, 2:23 PM

## 2011-02-20 NOTE — Progress Notes (Signed)
Seen and agree with SPT note Donyel Nester Tabor, PT [phone]  

## 2011-02-20 NOTE — Progress Notes (Signed)
Agree with SPT note. Maressa Apollo Tabor, PT [phone]  

## 2011-02-20 NOTE — Progress Notes (Signed)
Physical Therapy Discharge Note:  Patient has met all goals and has no further acute PT needs.  Discussed continued ambulation with nursing throughout the remainder of her stay.  All education completed.  Signing off.  Laney Pastor, SPT  02/20/11  2:25 PM

## 2011-02-21 LAB — BASIC METABOLIC PANEL
GFR calc Af Amer: >90 mL/min (ref >90)
GFR calc non Af Amer: 81 mL/min — ABNORMAL LOW (ref >90)
Glucose, Bld: 103 mg/dL — ABNORMAL HIGH (ref 70–99)
Potassium: 3.6 mEq/L (ref 3.5–5.1)
Sodium: 139 mEq/L (ref 135–145)

## 2011-02-21 LAB — CBC
Hemoglobin: 7.6 g/dL — ABNORMAL LOW (ref 12.0–15.0)
MCHC: 32.1 g/dL (ref 30.0–36.0)
WBC: 6.3 10*3/uL (ref 4.0–10.5)

## 2011-02-21 MED ORDER — TAB-A-VITE/IRON PO TABS
1.0000 | ORAL_TABLET | Freq: Every day | ORAL | Status: DC
  Administered 2011-02-21 – 2011-02-22 (×2): 1 via ORAL
  Filled 2011-02-21 (×3): qty 1

## 2011-02-21 NOTE — Progress Notes (Addendum)
Patient ID: Priscilla [name], female   DOB: Feb 11, 1931, 77 y.o.   MRN: [id number] 16 Days Post-Op  Subjective:  No nausea, with full liquids, stools are fewer and improving.  She feels pretty good today/   Objective: Vital signs in last 24 hours: Temp:  [98.2 F (36.8 C)-98.5 F (36.9 C)] 98.5 F (36.9 C) (11/30 0635) Pulse Rate:  [86-97] 86  (11/30 0635) Resp:  [18-20] 20  (11/30 0635) BP: (149-151)/(50-56) 151/56 mmHg (11/30 0635) SpO2:  [96 %-99 %] 96 % (11/30 0635) Last BM Date: 02/20/11  Intake/Output from previous day: 11/29 0701 - 11/30 0700 In: 1495.8 [P.O.:120; I.V.:1375.8] Out: -  Intake/Output this shift:    General appearance: alert, cooperative and no distress GI: soft, non-tender; bowel sounds normal; no masses,  no organomegaly and open wound with wet to dry, Kerlix, Sides look good the base is white fibrous tissue  No change in exam today.   Lab Results:   [facility] 02/21/11 0548  WBC 6.3  HGB 7.6*  HCT 23.7*  PLT 548*   BMET  Basename 02/21/11 0548 02/20/11 0550  NA 139 139  K 3.6 3.3*  CL 108 108  CO2 23 22  GLUCOSE 103* 101*  BUN <3* 4*  CREATININE 0.70 0.71  CALCIUM 8.4 8.1*   PT/INR No results found for this basename: LABPROT:2,INR:2 in the last 72 hours ABG No results found for this basename: PHART:2,PCO2:2,PO2:2,HCO3:2 in the last 72 hours  Studies/Results: No results found.  Anti-infectives: Anti-infectives     Start     Dose/Rate Route Frequency Ordered Stop   02/17/11 0900   metroNIDAZOLE (FLAGYL) tablet 500 mg        500 mg Oral 3 times per day 02/17/11 0858     02/16/11 1000   amoxicillin-clavulanate (AUGMENTIN) 875-125 MG per tablet 1 tablet  Status:  Discontinued        1 tablet Oral Every 12 hours 02/16/11 0831 02/17/11 0858   02/06/11 1500   ertapenem (INVANZ) 1 g in sodium chloride 0.9 % 50 mL IVPB  Status:  Discontinued        1 g 100 mL/hr over 30 Minutes Intravenous Every 24 hours 02/05/11 1526 02/15/11 0827   02/04/11 1706   ertapenem (INVANZ) 1 g in sodium chloride 0.9 % 50 mL IVPB        1 g 100 mL/hr over 30 Minutes Intravenous 60 min pre-op 02/04/11 1706 02/05/11 1200   02/02/11 0015   ciprofloxacin (CIPRO) IVPB 400 mg  Status:  Discontinued        400 mg 200 mL/hr over 60 Minutes Intravenous Every 12 hours 02/01/11 1314 02/05/11 2128   01/29/11 1200   ciprofloxacin (CIPRO) IVPB 400 mg  Status:  Discontinued        400 mg 200 mL/hr over 60 Minutes Intravenous Every 24 hours 01/28/11 1538 02/01/11 1314   01/28/11 0945   ciprofloxacin (CIPRO) IVPB 400 mg  Status:  Discontinued        400 mg 200 mL/hr over 60 Minutes Intravenous Every 12 hours 01/28/11 0933 01/28/11 1537   01/28/11 0945   metroNIDAZOLE (FLAGYL) IVPB 500 mg  Status:  Discontinued        500 mg 100 mL/hr over 60 Minutes Intravenous Every 8 hours 01/28/11 0933 02/03/11 0913          Assessment/Plan: s/p Procedure(s): EXPLORATORY LAPAROTOMY, SMALL BOWEL RESECTION - POD #16 - [place]  Doing much better today.  Taking some soft food.  Bowels are better.  Hopes to go home in 1 to 2 days.  DN  02/21/2011   LOS: 25 days   JENNINGS,WILLARD 02/21/2011  Assessment/Plan:   1.  EXPLORATORY LAPAROTOMY SMALL BOWEL RESECTION - 02/05/2011 [name] Has open wound - may need some debridement, but not now.   2. C. difficile colitis (02/18/2011) on Flagyl. Nausea better, less frequent bowel movements.   3. Anemia due to blood loss, acute (02/09/2011)  4.  Hypertension.  5.  Acute renal failure, resolved.  6.  Rectal cancer - LAR - 12/24/2006 - [name]. Plan:  Go on to soft diet, discussed going home.  She lives alone.  If she can tolerate a soft diet, and stools continue to improve we may be able to send home with wet to dry dressings, or consider a VAC.     LOS: 25 days   Subjective:  Doing better.  Sister in room visiting.  Objective:   Filed Vitals:   02/21/11 0635  BP: 151/56  Pulse: 86  Temp: 98.5 F (36.9  C)  Resp: 20     Intake/Output from previous day:  11/29 0701 - 11/30 0700 In: 1495.8 [P.O.:120; I.V.:1375.8] Out: -   Intake/Output this shift:      Physical Exam:   General: WN WF, older, who is alert and generally healthy appearing.    HEENT: Normal. Pupils equal. Good dentition. .   Lungs: Clear   Abdomen: Soft.  BS present.   Wound: Open wound.  Dressings BID.  Some tissue at bottom of wound may need debridement later.  Will need to check daily.   Neurologic:  Grossly intact to motor and sensory function.   Psychiatric: Has normal mood and affect. Behavior is normal   Lab Results:     [facility] 02/21/11 0548  WBC 6.3  HGB 7.6*  HCT 23.7*  PLT 548*    BMET    Basename 02/21/11 0548 02/20/11 0550  NA 139 139  K 3.6 3.3*  CL 108 108  CO2 23 22  GLUCOSE 103* 101*  BUN <3* 4*  CREATININE 0.70 0.71  CALCIUM 8.4 8.1*    PT/INR  No results found for this basename: LABPROT:2,INR:2 in the last 72 hours  ABG  No results found for this basename: PHART:2,PCO2:2,PO2:2,HCO3:2 in the last 72 hours   Studies/Results:  No results found.   Anti-infectives:   Anti-infectives     Start     Dose/Rate Route Frequency Ordered Stop   02/17/11 0900   metroNIDAZOLE (FLAGYL) tablet 500 mg        500 mg Oral 3 times per day 02/17/11 0858     02/16/11 1000   amoxicillin-clavulanate (AUGMENTIN) 875-125 MG per tablet 1 tablet  Status:  Discontinued        1 tablet Oral Every 12 hours 02/16/11 0831 02/17/11 0858   02/06/11 1500   ertapenem (INVANZ) 1 g in sodium chloride 0.9 % 50 mL IVPB  Status:  Discontinued        1 g 100 mL/hr over 30 Minutes Intravenous Every 24 hours 02/05/11 1526 02/15/11 0827   02/04/11 1706   ertapenem (INVANZ) 1 g in sodium chloride 0.9 % 50 mL IVPB        1 g 100 mL/hr over 30 Minutes Intravenous 60 min pre-op 02/04/11 1706 02/05/11 1200   02/02/11 0015   ciprofloxacin (CIPRO) IVPB 400 mg  Status:  Discontinued        400 mg 200 mL/hr over  60  Minutes Intravenous Every 12 hours 02/01/11 1314 02/05/11 2128   01/29/11 1200   ciprofloxacin (CIPRO) IVPB 400 mg  Status:  Discontinued        400 mg 200 mL/hr over 60 Minutes Intravenous Every 24 hours 01/28/11 1538 02/01/11 1314   01/28/11 0945   ciprofloxacin (CIPRO) IVPB 400 mg  Status:  Discontinued        400 mg 200 mL/hr over 60 Minutes Intravenous Every 12 hours 01/28/11 0933 01/28/11 1537   01/28/11 0945   metroNIDAZOLE (FLAGYL) IVPB 500 mg  Status:  Discontinued        500 mg 100 mL/hr over 60 Minutes Intravenous Every 8 hours 01/28/11 0933 02/03/11 0913           Ovidio Kin, MD, FACS Pager: [phone],   [facility] Office: [phone] 02/21/2011

## 2011-02-22 NOTE — Progress Notes (Signed)
17 Days Post-Op  Subjective: Feels better and starting to eat a bit more. No diarrhea  Objective: Vital signs in last 24 hours: Temp:  [98.2 F (36.8 C)-98.6 F (37 C)] 98.2 F (36.8 C) (12/01 0538) Pulse Rate:  [81-83] 81  (12/01 0538) Resp:  [18] 18  (12/01 0538) BP: (142-148)/(48-61) 148/61 mmHg (12/01 0538) SpO2:  [96 %-99 %] 96 % (12/01 0538) Last BM Date: 02/21/11  Intake/Output from previous day: 11/30 0701 - 12/01 0700 In: 1477.5 [P.O.:480; I.V.:997.5] Out: -  Intake/Output this shift:    General appearance: alert, cooperative and no distress GI: soft, non-tender; bowel sounds normal; no masses,  no organomegaly and Wound edges clean granualtions but still necrotic material at the base none ready to debride yet  Lab Results:   [facility] 02/21/11 0548  WBC 6.3  HGB 7.6*  HCT 23.7*  PLT 548*   BMET  Basename 02/21/11 0548 02/20/11 0550  NA 139 139  K 3.6 3.3*  CL 108 108  CO2 23 22  GLUCOSE 103* 101*  BUN <3* 4*  CREATININE 0.70 0.71  CALCIUM 8.4 8.1*   PT/INR No results found for this basename: LABPROT:2,INR:2 in the last 72 hours ABG No results found for this basename: PHART:2,PCO2:2,PO2:2,HCO3:2 in the last 72 hours  Studies/Results: No results found.  Anti-infectives: Anti-infectives     Start     Dose/Rate Route Frequency Ordered Stop   02/17/11 0900   metroNIDAZOLE (FLAGYL) tablet 500 mg        500 mg Oral 3 times per day 02/17/11 0858     02/16/11 1000   amoxicillin-clavulanate (AUGMENTIN) 875-125 MG per tablet 1 tablet  Status:  Discontinued        1 tablet Oral Every 12 hours 02/16/11 0831 02/17/11 0858   02/06/11 1500   ertapenem (INVANZ) 1 g in sodium chloride 0.9 % 50 mL IVPB  Status:  Discontinued        1 g 100 mL/hr over 30 Minutes Intravenous Every 24 hours 02/05/11 1526 02/15/11 0827   02/04/11 1706   ertapenem (INVANZ) 1 g in sodium chloride 0.9 % 50 mL IVPB        1 g 100 mL/hr over 30 Minutes Intravenous 60 min pre-op  02/04/11 1706 02/05/11 1200   02/02/11 0015   ciprofloxacin (CIPRO) IVPB 400 mg  Status:  Discontinued        400 mg 200 mL/hr over 60 Minutes Intravenous Every 12 hours 02/01/11 1314 02/05/11 2128   01/29/11 1200   ciprofloxacin (CIPRO) IVPB 400 mg  Status:  Discontinued        400 mg 200 mL/hr over 60 Minutes Intravenous Every 24 hours 01/28/11 1538 02/01/11 1314   01/28/11 0945   ciprofloxacin (CIPRO) IVPB 400 mg  Status:  Discontinued        400 mg 200 mL/hr over 60 Minutes Intravenous Every 12 hours 01/28/11 0933 01/28/11 1537   01/28/11 0945   metroNIDAZOLE (FLAGYL) IVPB 500 mg  Status:  Discontinued        500 mg 100 mL/hr over 60 Minutes Intravenous Every 8 hours 01/28/11 0933 02/03/11 0913          Assessment/Plan: s/p Procedure(s): EXPLORATORY LAPAROTOMY SMALL BOWEL RESECTION  Seems to be improving with diet and having less diarrhea  Will continue local wound care. If she does well with eating today and we have all home health care arrangements made can discharge tomorrow  LOS: 26 days    Priscilla [name]  J 02/22/2011

## 2011-02-23 MED ORDER — TRAMADOL-ACETAMINOPHEN 37.5-325 MG PO TABS: 1.0000 | ORAL_TABLET | Freq: Four times a day (QID) | ORAL | Status: AC | PRN

## 2011-02-23 MED ORDER — POTASSIUM CHLORIDE CRYS ER 20 MEQ PO TBCR: 20.0000 meq | EXTENDED_RELEASE_TABLET | Freq: Two times a day (BID) | ORAL | Status: DC

## 2011-02-23 MED ORDER — TAB-A-VITE/IRON PO TABS: 1.0000 | ORAL_TABLET | Freq: Every day | ORAL | Status: DC

## 2011-02-23 MED ORDER — METRONIDAZOLE 500 MG PO TABS: 500.0000 mg | ORAL_TABLET | Freq: Three times a day (TID) | ORAL | Status: AC

## 2011-02-23 NOTE — Progress Notes (Signed)
Case Management:   02/23/11 1100 Pt. to be discharged home today with [facility] RN BID dressing changes wet to dry. TC to [place], [facility] to make aware of new HH orders for drg changes and to let know of discharge today.

## 2011-02-23 NOTE — Discharge Summary (Signed)
Physician Discharge Summary  Patient ID: Priscilla [name] MRN: [id number] DOB/AGE: 05/16/1930 77 y.o.  Admit date: 01/27/2011 Discharge date: 02/23/2011  Admission Diagnoses: Enteritis, dehydration, and renal failure.  Discharge Diagnoses: Diarrhea, acute renal insufficiency with a creatinine up to 3.3 secondary to enteritis. Small bowel obstruction requiring lysis of adhesions and small bowel resection. Postop ileus Postoperative anemia Postoperative wound infection requiring opening to the fascia; ongoing wet to dry dressing. Protein calorie malnutrition placed on TNA. C. difficile colitis; started on Flagyl 02/17/2011. Principal Problem:  *Enteritis Active Problems:  SBO (small bowel obstruction)  Acute renal failure  Diarrhea  Dehydration  Hypotension  Hypokalemia  Anemia due to blood loss, acute  C. difficile colitis  history of hypertension History of atypical chest pain History of iron deficiency anemia History of colon and rectal cancer with resection. PROCEDURES: EGD 01/31/2011 Dr. Dorena Cookey Exploratory laparotomy lysis of adhesions small bowel resection 02/05/2011 Dr. Loretha Brasil.    Consults: Dr. Dorena Cookey gastroenterology  [facility] Course: Patient is a 77 year old female who presented to the ER was admitted by the medical service. She had several days of diarrhea nausea vomiting. She was dehydrated and had developed renal insufficiency. She was managed medically for the next several days. She was seen by the GI service and underwent endoscopy. During this time she developed a partial small bowel upper GI showed a bowel obstruction and she was taken to the OR on 02/05/2011 by Dr. Rayburn Ma. Postoperatively she had a very difficult course. She had postoperative ileus which was prolonged. She was placed on TNA for protein calorie malnutrition. She had an abdominal wound infection which were prior to opening of the abdominal wound to the fascia. She also developed  postoperative C. difficile colitis and required Flagyl therapy. She has postop anemia in addition to her baseline iron deficiency anemia. She has made slow but steady progress over the last week and by 02/23/11 it was Dr. Tenna Child opinion she could go home. At this her diarrhea is much improved. Her incision has some white fibrous tissue over the base covering the fascial sutures. The sides are pink and granulating well. She is taking a regular diet tolerating it well. We plan home health twice a day dressing changes. We'll continue the Flagyl for 7 days. We'll resume her preadmission medicines as before. We'll have her return to see Dr. Rayburn Ma in 7-10 day. Followup with Dr. Patty Sermons in 2 weeks. Condition on discharge: Improving. Will [facility] physician assistant for Dr. Cyndia Bent.         Disposition: Home or Self Care   Current Discharge Medication List    START taking these medications   Details  metroNIDAZOLE (FLAGYL) 500 MG tablet Take 1 tablet (500 mg total) by mouth every 8 (eight) hours. Qty: 24 tablet, Refills: 0    Multiple Vitamins-Iron (MULTIVITAMINS WITH IRON) TABS Take 1 tablet by mouth daily with supper. Qty: 1 tablet    potassium chloride SA (K-DUR,KLOR-CON) 20 MEQ tablet Take 1 tablet (20 mEq total) by mouth 2 (two) times daily. Qty: 60 tablet, Refills: 0    traMADol-acetaminophen (ULTRACET) 37.5-325 MG per tablet Take 1-2 tablets by mouth every 6 (six) hours as needed. Qty: 40 tablet, Refills: 1      CONTINUE these medications which have NOT CHANGED   Details  aspirin 81 MG tablet Take 81 mg by mouth daily.      atenolol (TENORMIN) 50 MG tablet Take 1 tablet (50 mg total) by mouth 2 (two) times daily.  Qty: 180 tablet, Refills: 3   Associated Diagnoses: Palpitations    Diphenhydramine-APAP, sleep, (TYLENOL PM EXTRA STRENGTH PO) Take 1-2 tablets by mouth at bedtime. For sleep     hydrochlorothiazide (,MICROZIDE/HYDRODIURIL,) 12.5 MG capsule Take 1  capsule (12.5 mg total) by mouth daily. Qty: 90 capsule, Refills: 3   Associated Diagnoses: HTN (hypertension)    losartan (COZAAR) 100 MG tablet Take 1 tablet (100 mg total) by mouth daily. Qty: 90 tablet, Refills: 3   Associated Diagnoses: HTN (hypertension)    nitroGLYCERIN (NITROSTAT) 0.4 MG SL tablet Place 0.4 mg under the tongue every 5 (five) minutes as needed. For chest pain.       Follow-up Information    Follow up with Cassell Clement, MD. Make an appointment in 2 weeks.   Contact information:   [address] [place] [address] [phone]       Follow up with Shelly Rubenstein, MD. Make an appointment in 2 weeks. (Try to get in end of this week or eary the following week.  Call right away if you have any problems.. When she is eating better you can add an iron supplement daily)    Contact information:   [facility] [address], [address] [place] [address] [phone]          Signed: Sherrie George 02/23/2011, 10:20 AM

## 2011-02-23 NOTE — Progress Notes (Signed)
18 Days Post-Op  Subjective: Continues to feel better and would like to go home.  Incision looks about the same.  Diarrhea is much better.  She is on Day 7 of Flagyl.  Objective: Vital signs in last 24 hours: Temp:  [97.7 F (36.5 C)-98.4 F (36.9 C)] 98.3 F (36.8 C) (12/02 0535) Pulse Rate:  [82-96] 82  (12/02 0535) Resp:  [18-20] 20  (12/02 0535) BP: (135-156)/(59-61) 138/61 mmHg (12/02 0535) SpO2:  [97 %-98 %] 97 % (12/02 0535) Last BM Date: 02/22/11  Intake/Output from previous day: 12/01 0701 - 12/02 0700 In: 1680 [P.O.:1080; I.V.:600] Out: 650 [Urine:650] Intake/Output this shift:    General appearance: alert, cooperative and no distress Resp: clear to auscultation bilaterally GI: soft, non-tender; bowel sounds normal; no masses,  no organomegaly and Incision still has that fibrous white tissue at base around her sutures.  Sidees are pink and granulating well.  Lab Results:   [facility] 02/21/11 0548  WBC 6.3  HGB 7.6*  HCT 23.7*  PLT 548*    BMET  Basename 02/21/11 0548  NA 139  K 3.6  CL 108  CO2 23  GLUCOSE 103*  BUN <3*  CREATININE 0.70  CALCIUM 8.4   PT/INR No results found for this basename: LABPROT:2,INR:2 in the last 72 hours   Studies/Results: No results found.  Anti-infectives: Anti-infectives     Start     Dose/Rate Route Frequency Ordered Stop   02/17/11 0900   metroNIDAZOLE (FLAGYL) tablet 500 mg        500 mg Oral 3 times per day 02/17/11 0858     02/16/11 1000   amoxicillin-clavulanate (AUGMENTIN) 875-125 MG per tablet 1 tablet  Status:  Discontinued        1 tablet Oral Every 12 hours 02/16/11 0831 02/17/11 0858   02/06/11 1500   ertapenem (INVANZ) 1 g in sodium chloride 0.9 % 50 mL IVPB  Status:  Discontinued        1 g 100 mL/hr over 30 Minutes Intravenous Every 24 hours 02/05/11 1526 02/15/11 0827   02/04/11 1706   ertapenem (INVANZ) 1 g in sodium chloride 0.9 % 50 mL IVPB        1 g 100 mL/hr over 30 Minutes Intravenous  60 min pre-op 02/04/11 1706 02/05/11 1200   02/02/11 0015   ciprofloxacin (CIPRO) IVPB 400 mg  Status:  Discontinued        400 mg 200 mL/hr over 60 Minutes Intravenous Every 12 hours 02/01/11 1314 02/05/11 2128   01/29/11 1200   ciprofloxacin (CIPRO) IVPB 400 mg  Status:  Discontinued        400 mg 200 mL/hr over 60 Minutes Intravenous Every 24 hours 01/28/11 1538 02/01/11 1314   01/28/11 0945   ciprofloxacin (CIPRO) IVPB 400 mg  Status:  Discontinued        400 mg 200 mL/hr over 60 Minutes Intravenous Every 12 hours 01/28/11 0933 01/28/11 1537   01/28/11 0945   metroNIDAZOLE (FLAGYL) IVPB 500 mg  Status:  Discontinued        500 mg 100 mL/hr over 60 Minutes Intravenous Every 8 hours 01/28/11 0933 02/03/11 0913         Current Facility-Administered Medications  Medication Dose Route Frequency Provider Last Rate Last Dose  . aspirin EC tablet 81 mg  81 mg Oral Daily Christella Hartigan, PHARMD   81 mg at 02/22/11 1000  . atenolol (TENORMIN) tablet 50 mg  50 mg Oral BID  Iona Coach, MD   50 mg at 02/22/11 2139  . bacitracin ointment 1 application  1 application Topical BID Letha Cape, PA   1 application at 02/22/11 2140  . dextrose 5 % and 0.9 % NaCl with KCl 20 mEq/L infusion   Intravenous Continuous Sherrie George, PA 50 mL/hr at 02/23/11 0700    . diphenhydrAMINE (BENADRYL) injection 12.5 mg  12.5 mg Intravenous Q6H PRN Letha Cape, PA   12.5 mg at 02/10/11 0242   Or  . diphenhydrAMINE (BENADRYL) 12.5 MG/5ML elixir 12.5 mg  12.5 mg Oral Q6H PRN Letha Cape, PA      . feeding supplement (ENSURE CLINICAL STRENGTH) liquid 237 mL  237 mL Oral TID WC Holly Anne Van Poots, RD   237 mL at 02/22/11 1410  . heparin injection 5,000 Units  5,000 Units Subcutaneous Q8H Letha Cape, PA   5,000 Units at 02/23/11 1610  . hydrochlorothiazide (MICROZIDE) capsule 25 mg  25 mg Oral Daily Sherrie George, [place]   25 mg at 02/22/11 1001  . LORazepam (ATIVAN) injection 0.5 mg  0.5 mg  Intravenous Q6H PRN Letha Cape, PA   0.5 mg at 02/14/11 1545  . losartan (COZAAR) tablet 100 mg  100 mg Oral Daily Christella Hartigan, PHARMD   100 mg at 02/22/11 1002  . metroNIDAZOLE (FLAGYL) tablet 500 mg  500 mg Oral Q8H Marianna Fuss, PA   500 mg at 02/23/11 0706  . multivitamins with iron tablet 1 tablet  1 tablet Oral Q supper Sherrie George, [place]   1 tablet at 02/22/11 1740  . naloxone [facility]) injection 0.4 mg  0.4 mg Intravenous PRN Letha Cape, PA       And  . sodium chloride 0.9 % injection 9 mL  9 mL Intravenous PRN Letha Cape, PA      . ondansetron [facility]) injection 4 mg  4 mg Intravenous Q6H PRN Letha Cape, PA   4 mg at 02/20/11 2220  . ondansetron (ZOFRAN) tablet 4 mg  4 mg Oral Q4H PRN Letha Cape, PA   4 mg at 02/18/11 1711  . phenol (CHLORASEPTIC) mouth spray 1 spray  1 spray Mouth/Throat PRN Caleen Essex III, MD   1 spray at 02/08/11 0830  . potassium chloride SA (K-DUR,KLOR-CON) CR tablet 20 mEq  20 mEq Oral BID Sherrie George, PA   20 mEq at 02/22/11 2140  . sodium chloride 0.9 % injection 10 mL  10 mL Intracatheter PRN Srikar A Reddy   10 mL at 02/21/11 0556  . traMADol-acetaminophen (ULTRACET) 37.5-325 MG per tablet 1-2 tablet  1-2 tablet Oral Q6H PRN Iona Coach, MD   1 tablet at 02/17/11 9604    Assessment/Plan Diarrhea and dehydration, with acute Real insuffiency creatinine 3.3: Enteritis SBO with lysis of adhesions and SBresection 02/05/11 Dr. [name] Post op ileus Post op Anemia Abdominal wound infection, required opening to Fascia. PCM/TNA C diff colitis11/25/12. Plan to send home today with home Health, Bid dressing changes and flagyl for 7 more day.   LOS: 27 days    Okie Jansson 02/23/2011

## 2011-02-23 NOTE — Progress Notes (Signed)
Will d/c pt. today.  Daughter Roger Shelter has been instructed to do dressing change this evening  , home health will see pt in am .

## 2011-02-23 NOTE — Progress Notes (Signed)
She is doing well and able to go home. She plans for her daughter to change the packing tonight and [facility] will be by tomorrow

## 2011-02-24 ENCOUNTER — Telehealth: Payer: Self-pay | Admitting: Cardiology

## 2011-02-24 DIAGNOSIS — Z86718 Personal history of other venous thrombosis and embolism: Secondary | ICD-10-CM | POA: Diagnosis not present

## 2011-02-24 DIAGNOSIS — I10 Essential (primary) hypertension: Secondary | ICD-10-CM | POA: Diagnosis not present

## 2011-02-24 DIAGNOSIS — Z85038 Personal history of other malignant neoplasm of large intestine: Secondary | ICD-10-CM | POA: Diagnosis not present

## 2011-02-24 DIAGNOSIS — M159 Polyosteoarthritis, unspecified: Secondary | ICD-10-CM | POA: Diagnosis not present

## 2011-02-24 DIAGNOSIS — Z96659 Presence of unspecified artificial knee joint: Secondary | ICD-10-CM | POA: Diagnosis not present

## 2011-02-24 NOTE — Telephone Encounter (Signed)
error 

## 2011-02-25 DIAGNOSIS — Z86718 Personal history of other venous thrombosis and embolism: Secondary | ICD-10-CM | POA: Diagnosis not present

## 2011-02-25 DIAGNOSIS — Z96659 Presence of unspecified artificial knee joint: Secondary | ICD-10-CM | POA: Diagnosis not present

## 2011-02-25 DIAGNOSIS — I10 Essential (primary) hypertension: Secondary | ICD-10-CM | POA: Diagnosis not present

## 2011-02-25 DIAGNOSIS — M159 Polyosteoarthritis, unspecified: Secondary | ICD-10-CM | POA: Diagnosis not present

## 2011-02-25 DIAGNOSIS — Z85038 Personal history of other malignant neoplasm of large intestine: Secondary | ICD-10-CM | POA: Diagnosis not present

## 2011-02-26 DIAGNOSIS — Z86718 Personal history of other venous thrombosis and embolism: Secondary | ICD-10-CM | POA: Diagnosis not present

## 2011-02-26 DIAGNOSIS — I10 Essential (primary) hypertension: Secondary | ICD-10-CM | POA: Diagnosis not present

## 2011-02-26 DIAGNOSIS — Z96659 Presence of unspecified artificial knee joint: Secondary | ICD-10-CM | POA: Diagnosis not present

## 2011-02-26 DIAGNOSIS — Z85038 Personal history of other malignant neoplasm of large intestine: Secondary | ICD-10-CM | POA: Diagnosis not present

## 2011-02-26 DIAGNOSIS — M159 Polyosteoarthritis, unspecified: Secondary | ICD-10-CM | POA: Diagnosis not present

## 2011-02-27 DIAGNOSIS — M159 Polyosteoarthritis, unspecified: Secondary | ICD-10-CM | POA: Diagnosis not present

## 2011-02-27 DIAGNOSIS — Z86718 Personal history of other venous thrombosis and embolism: Secondary | ICD-10-CM | POA: Diagnosis not present

## 2011-02-27 DIAGNOSIS — Z96659 Presence of unspecified artificial knee joint: Secondary | ICD-10-CM | POA: Diagnosis not present

## 2011-02-27 DIAGNOSIS — I10 Essential (primary) hypertension: Secondary | ICD-10-CM | POA: Diagnosis not present

## 2011-02-27 DIAGNOSIS — Z85038 Personal history of other malignant neoplasm of large intestine: Secondary | ICD-10-CM | POA: Diagnosis not present

## 2011-02-28 ENCOUNTER — Encounter (INDEPENDENT_AMBULATORY_CARE_PROVIDER_SITE_OTHER): Payer: Self-pay | Admitting: Surgery

## 2011-02-28 DIAGNOSIS — I10 Essential (primary) hypertension: Secondary | ICD-10-CM | POA: Diagnosis not present

## 2011-02-28 DIAGNOSIS — M159 Polyosteoarthritis, unspecified: Secondary | ICD-10-CM | POA: Diagnosis not present

## 2011-02-28 DIAGNOSIS — Z86718 Personal history of other venous thrombosis and embolism: Secondary | ICD-10-CM | POA: Diagnosis not present

## 2011-02-28 DIAGNOSIS — Z85038 Personal history of other malignant neoplasm of large intestine: Secondary | ICD-10-CM | POA: Diagnosis not present

## 2011-02-28 DIAGNOSIS — Z96659 Presence of unspecified artificial knee joint: Secondary | ICD-10-CM | POA: Diagnosis not present

## 2011-03-03 DIAGNOSIS — Z86718 Personal history of other venous thrombosis and embolism: Secondary | ICD-10-CM | POA: Diagnosis not present

## 2011-03-03 DIAGNOSIS — Z96659 Presence of unspecified artificial knee joint: Secondary | ICD-10-CM | POA: Diagnosis not present

## 2011-03-03 DIAGNOSIS — M159 Polyosteoarthritis, unspecified: Secondary | ICD-10-CM | POA: Diagnosis not present

## 2011-03-03 DIAGNOSIS — Z85038 Personal history of other malignant neoplasm of large intestine: Secondary | ICD-10-CM | POA: Diagnosis not present

## 2011-03-03 DIAGNOSIS — I10 Essential (primary) hypertension: Secondary | ICD-10-CM | POA: Diagnosis not present

## 2011-03-04 ENCOUNTER — Ambulatory Visit (INDEPENDENT_AMBULATORY_CARE_PROVIDER_SITE_OTHER): Payer: Medicare Other | Admitting: Surgery

## 2011-03-04 ENCOUNTER — Telehealth (INDEPENDENT_AMBULATORY_CARE_PROVIDER_SITE_OTHER): Payer: Self-pay | Admitting: General Surgery

## 2011-03-04 ENCOUNTER — Encounter (INDEPENDENT_AMBULATORY_CARE_PROVIDER_SITE_OTHER): Payer: Self-pay | Admitting: Surgery

## 2011-03-04 VITALS — BP 134/66 | HR 80 | Temp 97.6°F | Resp 18 | Ht 61.0 in | Wt 143.2 lb

## 2011-03-04 DIAGNOSIS — Z09 Encounter for follow-up examination after completed treatment for conditions other than malignant neoplasm: Secondary | ICD-10-CM

## 2011-03-04 NOTE — Telephone Encounter (Signed)
Called [facility] and spoke to Barron and gave order to start a wound vac on pt starting tomorrow 03-05-11 and until then pt is do wet to dry dressing changes and pt is aware as is her daugther

## 2011-03-04 NOTE — Progress Notes (Signed)
Subjective:     Patient ID: Priscilla [name], female   DOB: 1930/07/04, 77 y.o.   MRN: [id number]  HPI She is here for her first postoperative visit status post exploratory laparotomy and small bowel resection for adhesions and a small bowel obstruction. This was extensive surgery and necessitated a large amount about being removed. She had a long postoperative recovery. She is currently undergoing wet-to-dry dressing changes for a wound infection. She has nausea but is moving her bowels. She denies fevers  Review of Systems     Objective:   Physical Exam On exam, her abdomen is soft. The wound is quite large there is excellent granulation tissue. I did debride sharply some desiccated fat and the deep part of the incision    Assessment:     Patient status post exploratory laparotomy and small bowel resection for bowel obstruction with postop wound infection    Plan:     We will now have home health place a wound VAC device on the wound. A rotor for Zofran for her nausea. I will see her back in 3 weeks

## 2011-03-05 DIAGNOSIS — Z85038 Personal history of other malignant neoplasm of large intestine: Secondary | ICD-10-CM | POA: Diagnosis not present

## 2011-03-05 DIAGNOSIS — Z86718 Personal history of other venous thrombosis and embolism: Secondary | ICD-10-CM | POA: Diagnosis not present

## 2011-03-05 DIAGNOSIS — M159 Polyosteoarthritis, unspecified: Secondary | ICD-10-CM | POA: Diagnosis not present

## 2011-03-05 DIAGNOSIS — Z96659 Presence of unspecified artificial knee joint: Secondary | ICD-10-CM | POA: Diagnosis not present

## 2011-03-05 DIAGNOSIS — I10 Essential (primary) hypertension: Secondary | ICD-10-CM | POA: Diagnosis not present

## 2011-03-06 DIAGNOSIS — M159 Polyosteoarthritis, unspecified: Secondary | ICD-10-CM | POA: Diagnosis not present

## 2011-03-06 DIAGNOSIS — Z85038 Personal history of other malignant neoplasm of large intestine: Secondary | ICD-10-CM | POA: Diagnosis not present

## 2011-03-06 DIAGNOSIS — Z96659 Presence of unspecified artificial knee joint: Secondary | ICD-10-CM | POA: Diagnosis not present

## 2011-03-06 DIAGNOSIS — Z86718 Personal history of other venous thrombosis and embolism: Secondary | ICD-10-CM | POA: Diagnosis not present

## 2011-03-06 DIAGNOSIS — I10 Essential (primary) hypertension: Secondary | ICD-10-CM | POA: Diagnosis not present

## 2011-03-07 DIAGNOSIS — Z96659 Presence of unspecified artificial knee joint: Secondary | ICD-10-CM | POA: Diagnosis not present

## 2011-03-07 DIAGNOSIS — I10 Essential (primary) hypertension: Secondary | ICD-10-CM | POA: Diagnosis not present

## 2011-03-07 DIAGNOSIS — M159 Polyosteoarthritis, unspecified: Secondary | ICD-10-CM | POA: Diagnosis not present

## 2011-03-07 DIAGNOSIS — Z86718 Personal history of other venous thrombosis and embolism: Secondary | ICD-10-CM | POA: Diagnosis not present

## 2011-03-07 DIAGNOSIS — Z85038 Personal history of other malignant neoplasm of large intestine: Secondary | ICD-10-CM | POA: Diagnosis not present

## 2011-03-10 DIAGNOSIS — Z86718 Personal history of other venous thrombosis and embolism: Secondary | ICD-10-CM | POA: Diagnosis not present

## 2011-03-10 DIAGNOSIS — I10 Essential (primary) hypertension: Secondary | ICD-10-CM | POA: Diagnosis not present

## 2011-03-10 DIAGNOSIS — Z85038 Personal history of other malignant neoplasm of large intestine: Secondary | ICD-10-CM | POA: Diagnosis not present

## 2011-03-10 DIAGNOSIS — M159 Polyosteoarthritis, unspecified: Secondary | ICD-10-CM | POA: Diagnosis not present

## 2011-03-10 DIAGNOSIS — Z96659 Presence of unspecified artificial knee joint: Secondary | ICD-10-CM | POA: Diagnosis not present

## 2011-03-11 ENCOUNTER — Encounter: Payer: Self-pay | Admitting: Cardiology

## 2011-03-11 ENCOUNTER — Ambulatory Visit (INDEPENDENT_AMBULATORY_CARE_PROVIDER_SITE_OTHER): Payer: Medicare Other | Admitting: Cardiology

## 2011-03-11 ENCOUNTER — Telehealth (INDEPENDENT_AMBULATORY_CARE_PROVIDER_SITE_OTHER): Payer: Self-pay | Admitting: General Surgery

## 2011-03-11 VITALS — BP 130/62 | HR 70 | Resp 18 | Ht 61.0 in | Wt 143.0 lb

## 2011-03-11 DIAGNOSIS — Z8679 Personal history of other diseases of the circulatory system: Secondary | ICD-10-CM

## 2011-03-11 DIAGNOSIS — K56609 Unspecified intestinal obstruction, unspecified as to partial versus complete obstruction: Secondary | ICD-10-CM

## 2011-03-11 DIAGNOSIS — I119 Hypertensive heart disease without heart failure: Secondary | ICD-10-CM

## 2011-03-11 DIAGNOSIS — Z85038 Personal history of other malignant neoplasm of large intestine: Secondary | ICD-10-CM | POA: Diagnosis not present

## 2011-03-11 DIAGNOSIS — M159 Polyosteoarthritis, unspecified: Secondary | ICD-10-CM | POA: Diagnosis not present

## 2011-03-11 DIAGNOSIS — I10 Essential (primary) hypertension: Secondary | ICD-10-CM | POA: Diagnosis not present

## 2011-03-11 DIAGNOSIS — Z86718 Personal history of other venous thrombosis and embolism: Secondary | ICD-10-CM | POA: Diagnosis not present

## 2011-03-11 DIAGNOSIS — Z96659 Presence of unspecified artificial knee joint: Secondary | ICD-10-CM | POA: Diagnosis not present

## 2011-03-11 NOTE — Assessment & Plan Note (Signed)
The patient is making slow but steady progress following her surgery.  Her appetite is still poor.  She is having a difficult time taking in enough protein but she is drinking some Ensure now

## 2011-03-11 NOTE — Telephone Encounter (Signed)
Called pt back to let her know that I called [facility] again to find out what was going on about her wound vac. I spoke to [place] one of the nurses there and she was going to look into it, because I stated to Eunice Blase that I called on the 03-04-11 and gave a orders over the phone to start the wound vac on the 03-05-11 to [place]. I told the pt that I will be calling her back to let her know what is going on with the wound vac

## 2011-03-11 NOTE — Assessment & Plan Note (Signed)
Patient's blood pressure has been low postoperatively.  She has not been back on her losartan because her pressure is staying low without it.  Continue to leave off losartan for now

## 2011-03-11 NOTE — Progress Notes (Signed)
Priscilla [name] Date of Birth:  1930-04-02 [facility] [address]   [address] [place], [place]  [address] [phone]           Fax   [phone]  HPI: This pleasant 77 year old woman is seen back for a scheduled followup office visit.  She has a past history of essential hypertension and a history of mild aortic valve disease.  She does not have any history of ischemic heart disease.  She had a normal nuclear stress test on 11/17/08.  Her echocardiogram on 06/15/08 showed moderate aortic insufficiency and mild mitral regurgitation and normal LV function.  She has a history of arthritis and has had a previous right total knee replacement by Dr. Brynda Greathouse.  She was in her usual state of health until 02/05/2011 when she was admitted to [facility].  She was hospitalized for 4 weeks.  She wound up having a small bowel obstruction and after about a week or 10 days of observation they operated.  She has had some problems with adhesions and postoperative wound infection and home health is coming out to dress the wound.  Current Outpatient Prescriptions  Medication Sig Dispense Refill  . aspirin 81 MG tablet Take 81 mg by mouth daily.        Marland Kitchen atenolol (TENORMIN) 50 MG tablet Take 1 tablet (50 mg total) by mouth 2 (two) times daily.  180 tablet  3  . Diphenhydramine-APAP, sleep, (TYLENOL PM EXTRA STRENGTH PO) Take 1-2 tablets by mouth at bedtime. For sleep       . hydrochlorothiazide (,MICROZIDE/HYDRODIURIL,) 12.5 MG capsule Take 1 capsule (12.5 mg total) by mouth daily.  90 capsule  3  . Multiple Vitamins-Iron (MULTIVITAMINS WITH IRON) TABS Take 1 tablet by mouth daily with supper.  1 tablet    . nitroGLYCERIN (NITROSTAT) 0.4 MG SL tablet Place 0.4 mg under the tongue every 5 (five) minutes as needed. For chest pain.      . potassium chloride SA (K-DUR,KLOR-CON) 20 MEQ tablet Take 1 tablet (20 mEq total) by mouth 2 (two) times daily.  60 tablet  0  . losartan  (COZAAR) 100 MG tablet Take 1 tablet (100 mg total) by mouth daily.  90 tablet  3    Allergies  Allergen Reactions  . Morphine And Related Nausea And Vomiting  . Lisinopril Cough    Patient Active Problem List  Diagnoses  . History of valvular heart disease  . Benign hypertensive heart disease without heart failure  . Osteoarthritis  . History of colon cancer  . Rectal cancer  . Acute renal failure  . Dehydration  . Diarrhea  . Enteritis  . Hypotension  . Hypokalemia  . SBO (small bowel obstruction)  . Anemia due to blood loss, acute  . C. difficile colitis    History  Smoking status  . Never Smoker   Smokeless tobacco  . Not on file    History  Alcohol Use No    Family History  Problem Relation Age of Onset  . Heart disease Mother   . Heart disease Father   . Lupus Father   . Cancer Paternal Grandmother     colon    Review of Systems: The patient denies any heat or cold intolerance.  No weight gain or weight loss.  The patient denies headaches or blurry vision.  There is no cough or sputum production.  The patient denies dizziness.  There is no hematuria or hematochezia.  The patient denies any muscle aches or arthritis.  The patient denies any rash.  The patient denies frequent falling or instability.  There is no history of depression or anxiety.  All other systems were reviewed and are negative.   Physical Exam: Filed Vitals:   03/11/11 1539  BP: 130/62  Pulse: 70  Resp: 18   the general appearance reveals a well-developed well-nourished woman in no distress.  Pupils equal and reactive.   Extraocular Movements are full.  There is no scleral icterus.  The mouth and pharynx are normal.  The neck is supple.  The carotids reveal no bruits.  The jugular venous pressure is normal.  The thyroid is not enlarged.  There is no lymphadenopathy.  The chest is clear to percussion and auscultation. There are no rales or rhonchi. Expansion of the chest is  symmetrical.  Heart reveals a soft systolic basilar ejection murmur.  The abdomen has a large dressing and was not examined.The pedal pulses are good.  There is no phlebitis or edema.  There is no cyanosis or clubbing. Strength is normal and symmetrical in all extremities.  There is no lateralizing weakness.  There are no sensory deficits.  The skin is warm and dry.  There is no rash.     Assessment / Plan: Continue to be off losartan as long as blood pressure remains normal.  Recheck in 3 months for followup office visit

## 2011-03-11 NOTE — Assessment & Plan Note (Signed)
The patient has a past history of moderate aortic insufficiency and mild mitral regurgitation.  She did well with her gastrointestinal surgery did not have any problems with arrhythmia or congestive heart failure.

## 2011-03-11 NOTE — Patient Instructions (Signed)
Your physician recommends that you continue on your current medications as directed. Please refer to the Current Medication list given to you today.  Your physician recommends that you schedule a follow-up appointment in: 3 months  

## 2011-03-12 ENCOUNTER — Telehealth (INDEPENDENT_AMBULATORY_CARE_PROVIDER_SITE_OTHER): Payer: Self-pay | Admitting: General Surgery

## 2011-03-12 DIAGNOSIS — M159 Polyosteoarthritis, unspecified: Secondary | ICD-10-CM | POA: Diagnosis not present

## 2011-03-12 DIAGNOSIS — Z85038 Personal history of other malignant neoplasm of large intestine: Secondary | ICD-10-CM | POA: Diagnosis not present

## 2011-03-12 DIAGNOSIS — I10 Essential (primary) hypertension: Secondary | ICD-10-CM | POA: Diagnosis not present

## 2011-03-12 DIAGNOSIS — Z86718 Personal history of other venous thrombosis and embolism: Secondary | ICD-10-CM | POA: Diagnosis not present

## 2011-03-12 DIAGNOSIS — Z96659 Presence of unspecified artificial knee joint: Secondary | ICD-10-CM | POA: Diagnosis not present

## 2011-03-12 NOTE — Telephone Encounter (Signed)
Called Priscilla [name] about the wound vac, I told Mrs Circle that I got the order faxed back to [facility] and I was told from [facility] that Mrs Bastedo will get her wound vac today within two hours per Christeen Douglas from [facility]. I told Mrs Hedstrom to call me tomorrow if she did or did not get the wound vac. If she did not that I will take the next step and get her some help

## 2011-03-13 DIAGNOSIS — I10 Essential (primary) hypertension: Secondary | ICD-10-CM | POA: Diagnosis not present

## 2011-03-13 DIAGNOSIS — M159 Polyosteoarthritis, unspecified: Secondary | ICD-10-CM | POA: Diagnosis not present

## 2011-03-13 DIAGNOSIS — Z96659 Presence of unspecified artificial knee joint: Secondary | ICD-10-CM | POA: Diagnosis not present

## 2011-03-13 DIAGNOSIS — Z85038 Personal history of other malignant neoplasm of large intestine: Secondary | ICD-10-CM | POA: Diagnosis not present

## 2011-03-13 DIAGNOSIS — Z86718 Personal history of other venous thrombosis and embolism: Secondary | ICD-10-CM | POA: Diagnosis not present

## 2011-03-15 DIAGNOSIS — Z96659 Presence of unspecified artificial knee joint: Secondary | ICD-10-CM | POA: Diagnosis not present

## 2011-03-15 DIAGNOSIS — Z85038 Personal history of other malignant neoplasm of large intestine: Secondary | ICD-10-CM | POA: Diagnosis not present

## 2011-03-15 DIAGNOSIS — I10 Essential (primary) hypertension: Secondary | ICD-10-CM | POA: Diagnosis not present

## 2011-03-15 DIAGNOSIS — M159 Polyosteoarthritis, unspecified: Secondary | ICD-10-CM | POA: Diagnosis not present

## 2011-03-15 DIAGNOSIS — Z86718 Personal history of other venous thrombosis and embolism: Secondary | ICD-10-CM | POA: Diagnosis not present

## 2011-03-17 DIAGNOSIS — Z85038 Personal history of other malignant neoplasm of large intestine: Secondary | ICD-10-CM | POA: Diagnosis not present

## 2011-03-17 DIAGNOSIS — I10 Essential (primary) hypertension: Secondary | ICD-10-CM | POA: Diagnosis not present

## 2011-03-17 DIAGNOSIS — Z96659 Presence of unspecified artificial knee joint: Secondary | ICD-10-CM | POA: Diagnosis not present

## 2011-03-17 DIAGNOSIS — M159 Polyosteoarthritis, unspecified: Secondary | ICD-10-CM | POA: Diagnosis not present

## 2011-03-17 DIAGNOSIS — Z86718 Personal history of other venous thrombosis and embolism: Secondary | ICD-10-CM | POA: Diagnosis not present

## 2011-03-19 DIAGNOSIS — Z96659 Presence of unspecified artificial knee joint: Secondary | ICD-10-CM | POA: Diagnosis not present

## 2011-03-19 DIAGNOSIS — I10 Essential (primary) hypertension: Secondary | ICD-10-CM | POA: Diagnosis not present

## 2011-03-19 DIAGNOSIS — Z85038 Personal history of other malignant neoplasm of large intestine: Secondary | ICD-10-CM | POA: Diagnosis not present

## 2011-03-19 DIAGNOSIS — M159 Polyosteoarthritis, unspecified: Secondary | ICD-10-CM | POA: Diagnosis not present

## 2011-03-19 DIAGNOSIS — Z86718 Personal history of other venous thrombosis and embolism: Secondary | ICD-10-CM | POA: Diagnosis not present

## 2011-03-21 DIAGNOSIS — I10 Essential (primary) hypertension: Secondary | ICD-10-CM | POA: Diagnosis not present

## 2011-03-21 DIAGNOSIS — Z96659 Presence of unspecified artificial knee joint: Secondary | ICD-10-CM | POA: Diagnosis not present

## 2011-03-21 DIAGNOSIS — Z85038 Personal history of other malignant neoplasm of large intestine: Secondary | ICD-10-CM | POA: Diagnosis not present

## 2011-03-21 DIAGNOSIS — M159 Polyosteoarthritis, unspecified: Secondary | ICD-10-CM | POA: Diagnosis not present

## 2011-03-21 DIAGNOSIS — Z86718 Personal history of other venous thrombosis and embolism: Secondary | ICD-10-CM | POA: Diagnosis not present

## 2011-03-24 DIAGNOSIS — I10 Essential (primary) hypertension: Secondary | ICD-10-CM | POA: Diagnosis not present

## 2011-03-24 DIAGNOSIS — Z85038 Personal history of other malignant neoplasm of large intestine: Secondary | ICD-10-CM | POA: Diagnosis not present

## 2011-03-24 DIAGNOSIS — M159 Polyosteoarthritis, unspecified: Secondary | ICD-10-CM | POA: Diagnosis not present

## 2011-03-24 DIAGNOSIS — Z86718 Personal history of other venous thrombosis and embolism: Secondary | ICD-10-CM | POA: Diagnosis not present

## 2011-03-24 DIAGNOSIS — Z96659 Presence of unspecified artificial knee joint: Secondary | ICD-10-CM | POA: Diagnosis not present

## 2011-03-26 ENCOUNTER — Telehealth (INDEPENDENT_AMBULATORY_CARE_PROVIDER_SITE_OTHER): Payer: Self-pay | Admitting: General Surgery

## 2011-03-26 ENCOUNTER — Encounter (INDEPENDENT_AMBULATORY_CARE_PROVIDER_SITE_OTHER): Payer: Self-pay | Admitting: Surgery

## 2011-03-26 ENCOUNTER — Ambulatory Visit (INDEPENDENT_AMBULATORY_CARE_PROVIDER_SITE_OTHER): Payer: Medicare Other | Admitting: Surgery

## 2011-03-26 VITALS — BP 132/76 | HR 70 | Temp 98.1°F | Resp 16 | Ht 61.0 in | Wt 143.6 lb

## 2011-03-26 DIAGNOSIS — Z86718 Personal history of other venous thrombosis and embolism: Secondary | ICD-10-CM | POA: Diagnosis not present

## 2011-03-26 DIAGNOSIS — T8140XA Infection following a procedure, unspecified, initial encounter: Secondary | ICD-10-CM

## 2011-03-26 DIAGNOSIS — Z09 Encounter for follow-up examination after completed treatment for conditions other than malignant neoplasm: Secondary | ICD-10-CM

## 2011-03-26 DIAGNOSIS — Z96659 Presence of unspecified artificial knee joint: Secondary | ICD-10-CM | POA: Diagnosis not present

## 2011-03-26 DIAGNOSIS — M159 Polyosteoarthritis, unspecified: Secondary | ICD-10-CM | POA: Diagnosis not present

## 2011-03-26 DIAGNOSIS — I10 Essential (primary) hypertension: Secondary | ICD-10-CM | POA: Diagnosis not present

## 2011-03-26 DIAGNOSIS — Z85038 Personal history of other malignant neoplasm of large intestine: Secondary | ICD-10-CM | POA: Diagnosis not present

## 2011-03-26 NOTE — Progress Notes (Signed)
Subjective:     Patient ID: Priscilla [name], female   DOB: Apr 21, 1930, 78 y.o.   MRN: [id number]  HPI She is here for another visit. She is now undergoing wound VAC care. Apparently every couple of days the wound VAC stopped working. She has no other complaints. She has no obstructive symptoms  Review of Systems     Objective:   Physical Exam On exam, the wound is starting to contract. There is excellent granulation tissue on the edges but still moderate desiccated fat at the base. I used a scalpel and did extensive sharp debridement at the base of the wound including removal of all sutures    Assessment:     Patient with postop wound infection now undergoing wound VAC care    Plan:     We will continue the wound VAC. I will see her back in 2 weeks

## 2011-03-26 NOTE — Telephone Encounter (Signed)
Called Raynelle Fanning from [facility] and told her to order a new wound vac for pt

## 2011-03-27 DIAGNOSIS — Z85038 Personal history of other malignant neoplasm of large intestine: Secondary | ICD-10-CM | POA: Diagnosis not present

## 2011-03-27 DIAGNOSIS — Z86718 Personal history of other venous thrombosis and embolism: Secondary | ICD-10-CM | POA: Diagnosis not present

## 2011-03-27 DIAGNOSIS — Z96659 Presence of unspecified artificial knee joint: Secondary | ICD-10-CM | POA: Diagnosis not present

## 2011-03-27 DIAGNOSIS — M159 Polyosteoarthritis, unspecified: Secondary | ICD-10-CM | POA: Diagnosis not present

## 2011-03-27 DIAGNOSIS — I10 Essential (primary) hypertension: Secondary | ICD-10-CM | POA: Diagnosis not present

## 2011-03-29 DIAGNOSIS — Z96659 Presence of unspecified artificial knee joint: Secondary | ICD-10-CM | POA: Diagnosis not present

## 2011-03-29 DIAGNOSIS — Z86718 Personal history of other venous thrombosis and embolism: Secondary | ICD-10-CM | POA: Diagnosis not present

## 2011-03-29 DIAGNOSIS — M159 Polyosteoarthritis, unspecified: Secondary | ICD-10-CM | POA: Diagnosis not present

## 2011-03-29 DIAGNOSIS — Z85038 Personal history of other malignant neoplasm of large intestine: Secondary | ICD-10-CM | POA: Diagnosis not present

## 2011-03-29 DIAGNOSIS — I10 Essential (primary) hypertension: Secondary | ICD-10-CM | POA: Diagnosis not present

## 2011-03-31 DIAGNOSIS — I10 Essential (primary) hypertension: Secondary | ICD-10-CM | POA: Diagnosis not present

## 2011-03-31 DIAGNOSIS — M159 Polyosteoarthritis, unspecified: Secondary | ICD-10-CM | POA: Diagnosis not present

## 2011-03-31 DIAGNOSIS — Z96659 Presence of unspecified artificial knee joint: Secondary | ICD-10-CM | POA: Diagnosis not present

## 2011-03-31 DIAGNOSIS — Z86718 Personal history of other venous thrombosis and embolism: Secondary | ICD-10-CM | POA: Diagnosis not present

## 2011-03-31 DIAGNOSIS — Z85038 Personal history of other malignant neoplasm of large intestine: Secondary | ICD-10-CM | POA: Diagnosis not present

## 2011-04-02 DIAGNOSIS — I10 Essential (primary) hypertension: Secondary | ICD-10-CM | POA: Diagnosis not present

## 2011-04-02 DIAGNOSIS — Z85038 Personal history of other malignant neoplasm of large intestine: Secondary | ICD-10-CM | POA: Diagnosis not present

## 2011-04-02 DIAGNOSIS — M159 Polyosteoarthritis, unspecified: Secondary | ICD-10-CM | POA: Diagnosis not present

## 2011-04-02 DIAGNOSIS — Z86718 Personal history of other venous thrombosis and embolism: Secondary | ICD-10-CM | POA: Diagnosis not present

## 2011-04-02 DIAGNOSIS — Z96659 Presence of unspecified artificial knee joint: Secondary | ICD-10-CM | POA: Diagnosis not present

## 2011-04-04 DIAGNOSIS — Z86718 Personal history of other venous thrombosis and embolism: Secondary | ICD-10-CM | POA: Diagnosis not present

## 2011-04-04 DIAGNOSIS — Z96659 Presence of unspecified artificial knee joint: Secondary | ICD-10-CM | POA: Diagnosis not present

## 2011-04-04 DIAGNOSIS — M159 Polyosteoarthritis, unspecified: Secondary | ICD-10-CM | POA: Diagnosis not present

## 2011-04-04 DIAGNOSIS — I10 Essential (primary) hypertension: Secondary | ICD-10-CM | POA: Diagnosis not present

## 2011-04-04 DIAGNOSIS — Z85038 Personal history of other malignant neoplasm of large intestine: Secondary | ICD-10-CM | POA: Diagnosis not present

## 2011-04-06 DIAGNOSIS — Z85038 Personal history of other malignant neoplasm of large intestine: Secondary | ICD-10-CM | POA: Diagnosis not present

## 2011-04-06 DIAGNOSIS — Z86718 Personal history of other venous thrombosis and embolism: Secondary | ICD-10-CM | POA: Diagnosis not present

## 2011-04-06 DIAGNOSIS — I10 Essential (primary) hypertension: Secondary | ICD-10-CM | POA: Diagnosis not present

## 2011-04-06 DIAGNOSIS — Z96659 Presence of unspecified artificial knee joint: Secondary | ICD-10-CM | POA: Diagnosis not present

## 2011-04-06 DIAGNOSIS — M159 Polyosteoarthritis, unspecified: Secondary | ICD-10-CM | POA: Diagnosis not present

## 2011-04-08 DIAGNOSIS — Z85038 Personal history of other malignant neoplasm of large intestine: Secondary | ICD-10-CM | POA: Diagnosis not present

## 2011-04-08 DIAGNOSIS — Z96659 Presence of unspecified artificial knee joint: Secondary | ICD-10-CM | POA: Diagnosis not present

## 2011-04-08 DIAGNOSIS — M159 Polyosteoarthritis, unspecified: Secondary | ICD-10-CM | POA: Diagnosis not present

## 2011-04-08 DIAGNOSIS — Z86718 Personal history of other venous thrombosis and embolism: Secondary | ICD-10-CM | POA: Diagnosis not present

## 2011-04-08 DIAGNOSIS — I10 Essential (primary) hypertension: Secondary | ICD-10-CM | POA: Diagnosis not present

## 2011-04-10 ENCOUNTER — Ambulatory Visit (INDEPENDENT_AMBULATORY_CARE_PROVIDER_SITE_OTHER): Payer: Medicare Other | Admitting: Surgery

## 2011-04-10 ENCOUNTER — Encounter (INDEPENDENT_AMBULATORY_CARE_PROVIDER_SITE_OTHER): Payer: Self-pay | Admitting: Surgery

## 2011-04-10 VITALS — BP 148/66 | HR 68 | Temp 98.1°F | Resp 16 | Ht 61.0 in | Wt 141.2 lb

## 2011-04-10 DIAGNOSIS — M159 Polyosteoarthritis, unspecified: Secondary | ICD-10-CM | POA: Diagnosis not present

## 2011-04-10 DIAGNOSIS — Z85038 Personal history of other malignant neoplasm of large intestine: Secondary | ICD-10-CM | POA: Diagnosis not present

## 2011-04-10 DIAGNOSIS — Z09 Encounter for follow-up examination after completed treatment for conditions other than malignant neoplasm: Secondary | ICD-10-CM

## 2011-04-10 DIAGNOSIS — I10 Essential (primary) hypertension: Secondary | ICD-10-CM | POA: Diagnosis not present

## 2011-04-10 DIAGNOSIS — Z86718 Personal history of other venous thrombosis and embolism: Secondary | ICD-10-CM | POA: Diagnosis not present

## 2011-04-10 DIAGNOSIS — Z96659 Presence of unspecified artificial knee joint: Secondary | ICD-10-CM | POA: Diagnosis not present

## 2011-04-10 NOTE — Progress Notes (Signed)
Subjective:     Patient ID: Priscilla [name], female   DOB: Apr 20, 1930, 78 y.o.   MRN: [id number]  HPI  She is here today for another postoperative visit. She is doing well has no complaints. She reports that the wound VAC has been working well Review of Systems     Objective:   Physical Exam On exam, the wound is very clean and is contracting very well and shows excellent granulation tissue with no evidence of infection    Assessment:     Patient status post exploratory lap for bowel obstruction with postop wound infection    Plan:     We will stop the wound VAC at her request and start hydrogel wound gel and she is doing quite well. I will see her back in 3 weeks

## 2011-04-14 DIAGNOSIS — Z85038 Personal history of other malignant neoplasm of large intestine: Secondary | ICD-10-CM | POA: Diagnosis not present

## 2011-04-14 DIAGNOSIS — Z96659 Presence of unspecified artificial knee joint: Secondary | ICD-10-CM | POA: Diagnosis not present

## 2011-04-14 DIAGNOSIS — M159 Polyosteoarthritis, unspecified: Secondary | ICD-10-CM | POA: Diagnosis not present

## 2011-04-14 DIAGNOSIS — I10 Essential (primary) hypertension: Secondary | ICD-10-CM | POA: Diagnosis not present

## 2011-04-14 DIAGNOSIS — Z86718 Personal history of other venous thrombosis and embolism: Secondary | ICD-10-CM | POA: Diagnosis not present

## 2011-04-16 DIAGNOSIS — Z86718 Personal history of other venous thrombosis and embolism: Secondary | ICD-10-CM | POA: Diagnosis not present

## 2011-04-16 DIAGNOSIS — Z96659 Presence of unspecified artificial knee joint: Secondary | ICD-10-CM | POA: Diagnosis not present

## 2011-04-16 DIAGNOSIS — I10 Essential (primary) hypertension: Secondary | ICD-10-CM | POA: Diagnosis not present

## 2011-04-16 DIAGNOSIS — Z85038 Personal history of other malignant neoplasm of large intestine: Secondary | ICD-10-CM | POA: Diagnosis not present

## 2011-04-16 DIAGNOSIS — M159 Polyosteoarthritis, unspecified: Secondary | ICD-10-CM | POA: Diagnosis not present

## 2011-04-18 DIAGNOSIS — Z85038 Personal history of other malignant neoplasm of large intestine: Secondary | ICD-10-CM | POA: Diagnosis not present

## 2011-04-18 DIAGNOSIS — Z86718 Personal history of other venous thrombosis and embolism: Secondary | ICD-10-CM | POA: Diagnosis not present

## 2011-04-18 DIAGNOSIS — Z96659 Presence of unspecified artificial knee joint: Secondary | ICD-10-CM | POA: Diagnosis not present

## 2011-04-18 DIAGNOSIS — I10 Essential (primary) hypertension: Secondary | ICD-10-CM | POA: Diagnosis not present

## 2011-04-18 DIAGNOSIS — M159 Polyosteoarthritis, unspecified: Secondary | ICD-10-CM | POA: Diagnosis not present

## 2011-04-22 DIAGNOSIS — I10 Essential (primary) hypertension: Secondary | ICD-10-CM | POA: Diagnosis not present

## 2011-04-22 DIAGNOSIS — Z86718 Personal history of other venous thrombosis and embolism: Secondary | ICD-10-CM | POA: Diagnosis not present

## 2011-04-22 DIAGNOSIS — Z96659 Presence of unspecified artificial knee joint: Secondary | ICD-10-CM | POA: Diagnosis not present

## 2011-04-22 DIAGNOSIS — Z85038 Personal history of other malignant neoplasm of large intestine: Secondary | ICD-10-CM | POA: Diagnosis not present

## 2011-04-22 DIAGNOSIS — M159 Polyosteoarthritis, unspecified: Secondary | ICD-10-CM | POA: Diagnosis not present

## 2011-04-25 DIAGNOSIS — Z96659 Presence of unspecified artificial knee joint: Secondary | ICD-10-CM | POA: Diagnosis not present

## 2011-04-25 DIAGNOSIS — I10 Essential (primary) hypertension: Secondary | ICD-10-CM | POA: Diagnosis not present

## 2011-04-25 DIAGNOSIS — Z85038 Personal history of other malignant neoplasm of large intestine: Secondary | ICD-10-CM | POA: Diagnosis not present

## 2011-04-25 DIAGNOSIS — M159 Polyosteoarthritis, unspecified: Secondary | ICD-10-CM | POA: Diagnosis not present

## 2011-04-25 DIAGNOSIS — Z86718 Personal history of other venous thrombosis and embolism: Secondary | ICD-10-CM | POA: Diagnosis not present

## 2011-04-29 DIAGNOSIS — Z96659 Presence of unspecified artificial knee joint: Secondary | ICD-10-CM | POA: Diagnosis not present

## 2011-04-29 DIAGNOSIS — I10 Essential (primary) hypertension: Secondary | ICD-10-CM | POA: Diagnosis not present

## 2011-04-29 DIAGNOSIS — Z86718 Personal history of other venous thrombosis and embolism: Secondary | ICD-10-CM | POA: Diagnosis not present

## 2011-04-29 DIAGNOSIS — M159 Polyosteoarthritis, unspecified: Secondary | ICD-10-CM | POA: Diagnosis not present

## 2011-04-29 DIAGNOSIS — Z85038 Personal history of other malignant neoplasm of large intestine: Secondary | ICD-10-CM | POA: Diagnosis not present

## 2011-05-02 DIAGNOSIS — Z85038 Personal history of other malignant neoplasm of large intestine: Secondary | ICD-10-CM | POA: Diagnosis not present

## 2011-05-02 DIAGNOSIS — Z96659 Presence of unspecified artificial knee joint: Secondary | ICD-10-CM | POA: Diagnosis not present

## 2011-05-02 DIAGNOSIS — M159 Polyosteoarthritis, unspecified: Secondary | ICD-10-CM | POA: Diagnosis not present

## 2011-05-02 DIAGNOSIS — Z86718 Personal history of other venous thrombosis and embolism: Secondary | ICD-10-CM | POA: Diagnosis not present

## 2011-05-02 DIAGNOSIS — I10 Essential (primary) hypertension: Secondary | ICD-10-CM | POA: Diagnosis not present

## 2011-05-05 ENCOUNTER — Encounter (INDEPENDENT_AMBULATORY_CARE_PROVIDER_SITE_OTHER): Payer: Self-pay | Admitting: Surgery

## 2011-05-05 ENCOUNTER — Ambulatory Visit (INDEPENDENT_AMBULATORY_CARE_PROVIDER_SITE_OTHER): Payer: Medicare Other | Admitting: Surgery

## 2011-05-05 VITALS — BP 150/92 | HR 74 | Temp 97.8°F | Resp 18 | Ht 61.0 in | Wt 145.6 lb

## 2011-05-05 DIAGNOSIS — Z09 Encounter for follow-up examination after completed treatment for conditions other than malignant neoplasm: Secondary | ICD-10-CM

## 2011-05-05 NOTE — Progress Notes (Signed)
Subjective:     Patient ID: Priscilla [name], female   DOB: 12/26/1930, 78 y.o.   MRN: [id number]  HPI  She is here today for a wound check. She is doing well and has no complaints. She is currently undergoing hydrogel dressing changes daily. Home health is still seen her twice a week Review of Systems     Objective:   Physical Exam On exam, the wound is much smaller than before. The wound remains deep There is excellent granulation tissue    Assessment:     Nonhealing postop wound    Plan:     Plan is to continue hydrogel dressing changes daily. I still want home health to see her. I will see her back in 3 weeks

## 2011-05-06 DIAGNOSIS — Z85038 Personal history of other malignant neoplasm of large intestine: Secondary | ICD-10-CM | POA: Diagnosis not present

## 2011-05-06 DIAGNOSIS — I10 Essential (primary) hypertension: Secondary | ICD-10-CM | POA: Diagnosis not present

## 2011-05-06 DIAGNOSIS — Z86718 Personal history of other venous thrombosis and embolism: Secondary | ICD-10-CM | POA: Diagnosis not present

## 2011-05-06 DIAGNOSIS — Z96659 Presence of unspecified artificial knee joint: Secondary | ICD-10-CM | POA: Diagnosis not present

## 2011-05-06 DIAGNOSIS — M159 Polyosteoarthritis, unspecified: Secondary | ICD-10-CM | POA: Diagnosis not present

## 2011-05-09 DIAGNOSIS — Z85038 Personal history of other malignant neoplasm of large intestine: Secondary | ICD-10-CM | POA: Diagnosis not present

## 2011-05-09 DIAGNOSIS — Z86718 Personal history of other venous thrombosis and embolism: Secondary | ICD-10-CM | POA: Diagnosis not present

## 2011-05-09 DIAGNOSIS — M159 Polyosteoarthritis, unspecified: Secondary | ICD-10-CM | POA: Diagnosis not present

## 2011-05-09 DIAGNOSIS — Z96659 Presence of unspecified artificial knee joint: Secondary | ICD-10-CM | POA: Diagnosis not present

## 2011-05-09 DIAGNOSIS — I10 Essential (primary) hypertension: Secondary | ICD-10-CM | POA: Diagnosis not present

## 2011-05-16 DIAGNOSIS — Z86718 Personal history of other venous thrombosis and embolism: Secondary | ICD-10-CM | POA: Diagnosis not present

## 2011-05-16 DIAGNOSIS — Z96659 Presence of unspecified artificial knee joint: Secondary | ICD-10-CM | POA: Diagnosis not present

## 2011-05-16 DIAGNOSIS — I10 Essential (primary) hypertension: Secondary | ICD-10-CM | POA: Diagnosis not present

## 2011-05-16 DIAGNOSIS — Z85038 Personal history of other malignant neoplasm of large intestine: Secondary | ICD-10-CM | POA: Diagnosis not present

## 2011-05-16 DIAGNOSIS — M159 Polyosteoarthritis, unspecified: Secondary | ICD-10-CM | POA: Diagnosis not present

## 2011-05-20 DIAGNOSIS — M159 Polyosteoarthritis, unspecified: Secondary | ICD-10-CM | POA: Diagnosis not present

## 2011-05-20 DIAGNOSIS — Z86718 Personal history of other venous thrombosis and embolism: Secondary | ICD-10-CM | POA: Diagnosis not present

## 2011-05-20 DIAGNOSIS — Z96659 Presence of unspecified artificial knee joint: Secondary | ICD-10-CM | POA: Diagnosis not present

## 2011-05-20 DIAGNOSIS — Z85038 Personal history of other malignant neoplasm of large intestine: Secondary | ICD-10-CM | POA: Diagnosis not present

## 2011-05-20 DIAGNOSIS — I10 Essential (primary) hypertension: Secondary | ICD-10-CM | POA: Diagnosis not present

## 2011-05-23 DIAGNOSIS — Z85038 Personal history of other malignant neoplasm of large intestine: Secondary | ICD-10-CM | POA: Diagnosis not present

## 2011-05-23 DIAGNOSIS — M159 Polyosteoarthritis, unspecified: Secondary | ICD-10-CM | POA: Diagnosis not present

## 2011-05-23 DIAGNOSIS — I10 Essential (primary) hypertension: Secondary | ICD-10-CM | POA: Diagnosis not present

## 2011-05-23 DIAGNOSIS — Z86718 Personal history of other venous thrombosis and embolism: Secondary | ICD-10-CM | POA: Diagnosis not present

## 2011-05-23 DIAGNOSIS — Z96659 Presence of unspecified artificial knee joint: Secondary | ICD-10-CM | POA: Diagnosis not present

## 2011-05-27 ENCOUNTER — Other Ambulatory Visit (INDEPENDENT_AMBULATORY_CARE_PROVIDER_SITE_OTHER): Payer: Self-pay | Admitting: Surgery

## 2011-05-27 ENCOUNTER — Encounter (INDEPENDENT_AMBULATORY_CARE_PROVIDER_SITE_OTHER): Payer: Self-pay | Admitting: General Surgery

## 2011-05-27 ENCOUNTER — Encounter (INDEPENDENT_AMBULATORY_CARE_PROVIDER_SITE_OTHER): Payer: Self-pay | Admitting: Surgery

## 2011-05-27 ENCOUNTER — Ambulatory Visit (INDEPENDENT_AMBULATORY_CARE_PROVIDER_SITE_OTHER): Payer: Medicare Other | Admitting: Surgery

## 2011-05-27 VITALS — BP 156/64 | HR 70 | Temp 97.6°F | Ht 61.0 in | Wt 145.2 lb

## 2011-05-27 DIAGNOSIS — T8189XA Other complications of procedures, not elsewhere classified, initial encounter: Secondary | ICD-10-CM | POA: Diagnosis not present

## 2011-05-27 NOTE — Progress Notes (Signed)
Subjective:     Patient ID: Priscilla [name], female   DOB: 1930-09-17, 78 y.o.   MRN: [id number]  HPI She is here for another visit. She is doing well and has no complaints. She is still undergoing wet-to-dry dressing changes to the abdominal wound  Review of Systems     Objective:   Physical Exam On exam, there is excellent granulation tissue. It remains a deep wound but is still small. At this point I decided to treat the wound a xenograft in the office. I placed the powder in the wound and then covered this with a xenograft. I then placed Adaptic, gel, and gauze as well as a Tegaderm    Assessment:     Nonhealing surgical wound status post xenograft placement    Plan:     We will now stop home health. She will leave the bandage in place and I will see her back next week

## 2011-06-03 ENCOUNTER — Encounter (INDEPENDENT_AMBULATORY_CARE_PROVIDER_SITE_OTHER): Payer: Self-pay | Admitting: Surgery

## 2011-06-03 ENCOUNTER — Ambulatory Visit (INDEPENDENT_AMBULATORY_CARE_PROVIDER_SITE_OTHER): Payer: Medicare Other | Admitting: Surgery

## 2011-06-03 VITALS — BP 180/74 | HR 64 | Temp 97.2°F | Resp 18 | Ht 61.0 in | Wt 145.6 lb

## 2011-06-03 DIAGNOSIS — T8189XA Other complications of procedures, not elsewhere classified, initial encounter: Secondary | ICD-10-CM

## 2011-06-03 NOTE — Progress Notes (Signed)
Subjective:     Patient ID: Priscilla [name], female   DOB: December 12, 1930, 77 y.o.   MRN: [id number]  HPI She is here for a wound check. She had increased drainage after the xenograft placement. It really irritated the skin.  Review of Systems     Objective:   Physical Exam On exam, there is excoriation of the skin circumferentially from a Tegaderm. The wound itself is very clean.  At this point we applied the xenograft powder and a dry bandage without Tegaderm.    Assessment:     Nonhealing surgical wound status post xenograft placement    Plan:     I will see her back in 3 days for followup

## 2011-06-06 ENCOUNTER — Ambulatory Visit (INDEPENDENT_AMBULATORY_CARE_PROVIDER_SITE_OTHER): Payer: Medicare Other | Admitting: Surgery

## 2011-06-06 ENCOUNTER — Encounter (INDEPENDENT_AMBULATORY_CARE_PROVIDER_SITE_OTHER): Payer: Self-pay | Admitting: Surgery

## 2011-06-06 VITALS — BP 146/68 | HR 78 | Resp 16 | Ht 61.0 in | Wt 145.0 lb

## 2011-06-06 DIAGNOSIS — T8189XA Other complications of procedures, not elsewhere classified, initial encounter: Secondary | ICD-10-CM

## 2011-06-06 NOTE — Progress Notes (Signed)
Subjective:     Patient ID: Priscilla [name], female   DOB: Sep 25, 1930, 78 y.o.   MRN: [id number]  HPI She is here for followup of the xenograft placement. She is feeling much better.  Review of Systems     Objective:   Physical Exam On exam, the wound has contracted further and there is excellent granulation tissue. Her skin also looks much better. I replaced the xenograft powder in the wound again    Assessment:     Nonhealing surgical wound status post xenograft placement    Plan:     She will leave this dressing until Monday and then start hydrogel daily. I will see her back in 2 weeks

## 2011-06-13 ENCOUNTER — Encounter: Payer: Self-pay | Admitting: Cardiology

## 2011-06-13 ENCOUNTER — Other Ambulatory Visit: Payer: Medicare Other

## 2011-06-13 ENCOUNTER — Ambulatory Visit (INDEPENDENT_AMBULATORY_CARE_PROVIDER_SITE_OTHER): Payer: Medicare Other | Admitting: Cardiology

## 2011-06-13 VITALS — BP 130/60 | HR 76 | Ht 61.0 in | Wt 145.0 lb

## 2011-06-13 DIAGNOSIS — I119 Hypertensive heart disease without heart failure: Secondary | ICD-10-CM | POA: Diagnosis not present

## 2011-06-13 DIAGNOSIS — D649 Anemia, unspecified: Secondary | ICD-10-CM | POA: Diagnosis not present

## 2011-06-13 DIAGNOSIS — I359 Nonrheumatic aortic valve disorder, unspecified: Secondary | ICD-10-CM

## 2011-06-13 DIAGNOSIS — Z8679 Personal history of other diseases of the circulatory system: Secondary | ICD-10-CM

## 2011-06-13 LAB — HEPATIC FUNCTION PANEL
AST: 24 U/L (ref 0–37)
Alkaline Phosphatase: 83 U/L (ref 39–117)
Total Bilirubin: 0.4 mg/dL (ref 0.3–1.2)

## 2011-06-13 LAB — BASIC METABOLIC PANEL
Chloride: 101 mEq/L (ref 96–112)
GFR: 79.33 mL/min (ref >60.00)
Glucose, Bld: 100 mg/dL — ABNORMAL HIGH (ref 70–99)
Potassium: 4.1 mEq/L (ref 3.5–5.1)
Sodium: 138 mEq/L (ref 135–145)

## 2011-06-13 LAB — CBC WITH DIFFERENTIAL/PLATELET
Eosinophils Absolute: 0.1 10*3/uL (ref 0.0–0.7)
Eosinophils Relative: 2.4 % (ref 0.0–5.0)
Lymphocytes Relative: 25.9 % (ref 12.0–46.0)
MCV: 89.3 fl (ref 78.0–100.0)
Monocytes Absolute: 0.5 10*3/uL (ref 0.1–1.0)
Neutrophils Relative %: 61.9 % (ref 43.0–77.0)
Platelets: 334 10*3/uL (ref 150.0–400.0)
WBC: 5.2 10*3/uL (ref 4.5–10.5)

## 2011-06-13 NOTE — Assessment & Plan Note (Signed)
Her blood pressure has been remaining stable on current therapy.  She has not been expressing any dizzy spells.  No chest pain.  No palpitations.

## 2011-06-13 NOTE — Progress Notes (Signed)
Priscilla [name] Date of Birth:  1931/01/04 [facility] [address] [place], [place]  [address] [phone]  Fax   [phone]  HPI: This pleasant 78 year old woman is seen for a scheduled followup office visit.  She has a past history of essential hypertension and a history of mild aortic valve disease.  She does not have any history of ischemic heart disease.  She has had a previous right total knee replacement by Dr. Brynda Greathouse.  In November she was admitted to [facility] with small bowel obstruction and required surgery.  She has had a problem with postoperative wound infection and her wound is still deep and is healing very slowly.  Current Outpatient Prescriptions  Medication Sig Dispense Refill  . aspirin 81 MG tablet Take 81 mg by mouth daily.        Marland Kitchen atenolol (TENORMIN) 50 MG tablet Take 1 tablet (50 mg total) by mouth 2 (two) times daily.  180 tablet  3  . BIOTIN PO Take by mouth daily.      . Diphenhydramine-APAP, sleep, (TYLENOL PM EXTRA STRENGTH PO) Take 1-2 tablets by mouth at bedtime. For sleep       . hydrochlorothiazide (,MICROZIDE/HYDRODIURIL,) 12.5 MG capsule Take 1 capsule (12.5 mg total) by mouth daily.  90 capsule  3  . Multiple Vitamins-Iron (MULTIVITAMINS WITH IRON) TABS Take 1 tablet by mouth daily with supper.  1 tablet    . nitroGLYCERIN (NITROSTAT) 0.4 MG SL tablet Place 0.4 mg under the tongue every 5 (five) minutes as needed. For chest pain.        Allergies  Allergen Reactions  . Morphine And Related Nausea And Vomiting  . Lisinopril Cough    Patient Active Problem List  Diagnoses  . History of valvular heart disease  . Benign hypertensive heart disease without heart failure  . Osteoarthritis  . History of colon cancer  . Rectal cancer  . Acute renal failure  . Dehydration  . Diarrhea  . Enteritis  . Hypotension  . Hypokalemia  . SBO (small bowel obstruction)  . Anemia due to blood loss, acute  . C. difficile  colitis    History  Smoking status  . Never Smoker   Smokeless tobacco  . Never Used    History  Alcohol Use No    Family History  Problem Relation Age of Onset  . Heart disease Mother   . Heart disease Father   . Lupus Father   . Cancer Paternal Grandmother     colon    Review of Systems: The patient denies any heat or cold intolerance.  No weight gain or weight loss.  The patient denies headaches or blurry vision.  There is no cough or sputum production.  The patient denies dizziness.  There is no hematuria or hematochezia.  The patient denies any muscle aches or arthritis.  The patient denies any rash.  The patient denies frequent falling or instability.  There is no history of depression or anxiety.  All other systems were reviewed and are negative.   Physical Exam: Filed Vitals:   06/13/11 1113  BP: 130/60  Pulse: 76   the general appearance reveals a well-developed well-nourished woman in no distress.Pupils equal and reactive.   Extraocular Movements are full.  There is no scleral icterus.  The mouth and pharynx are normal.  The neck is supple.  The carotids reveal no bruits.  The jugular venous pressure is normal.  The thyroid is not  enlarged.  There is no lymphadenopathy.  The chest is clear to percussion and auscultation. There are no rales or rhonchi. Expansion of the chest is symmetrical.  The precordium is quiet.  The first heart sound is normal.  The second heart sound is physiologically split.  There is no murmur gallop rub or click.  There is no abnormal lift or heave.  Her abdomen reveals a deep midline wound which is packed with gauze.  It does not appear to be infected and there is no cellulitis.The pedal pulses are good.  There is no phlebitis or edema.  There is no cyanosis or clubbing. Strength is normal and symmetrical in all extremities.  There is no lateralizing weakness.  There are no sensory deficits.      Assessment / Plan:  The patient is to  continue same medication and be rechecked in 4 months.  We are checking a CBC today as well as chemistries.  She is concerned that she might be diabetic as an explanation for why she is feeling so poorly.  She has a past history of anemia.

## 2011-06-13 NOTE — Assessment & Plan Note (Signed)
Her last echocardiogram in March 2010 showed moderate aortic insufficiency with mild mitral regurgitation and she had normal left ventricular function.  She has not been expressing any symptoms of congestive heart failure.

## 2011-06-13 NOTE — Patient Instructions (Addendum)
Will obtain labs today and call you with the results (cbc/bmet/hfp)  Your physician recommends that you continue on your current medications as directed. Please refer to the Current Medication list given to you today.  Your physician wants you to follow-up in: 4 months receive a reminder letter in the mail two months in advance. If you don't receive a letter, please  call our office to schedule the follow-up appointment.

## 2011-06-14 NOTE — Progress Notes (Signed)
Quick Note:  Please report to patient. The recent labs are stable. Continue same medication and careful diet. ______ 

## 2011-06-16 ENCOUNTER — Telehealth: Payer: Self-pay | Admitting: *Deleted

## 2011-06-16 NOTE — Telephone Encounter (Signed)
Message copied by Burnell Blanks on Mon Jun 16, 2011 12:19 PM ------      Message from: Cassell Clement      Created: Sat Jun 14, 2011  2:20 PM       Please report to patient.  The recent labs are stable. Continue same medication and careful diet.

## 2011-06-16 NOTE — Telephone Encounter (Signed)
Mailed copy of labs and left message to call if any questions  

## 2011-06-24 ENCOUNTER — Ambulatory Visit (INDEPENDENT_AMBULATORY_CARE_PROVIDER_SITE_OTHER): Payer: Medicare Other | Admitting: Surgery

## 2011-06-24 ENCOUNTER — Encounter (INDEPENDENT_AMBULATORY_CARE_PROVIDER_SITE_OTHER): Payer: Self-pay | Admitting: Surgery

## 2011-06-24 VITALS — BP 152/68 | HR 70 | Temp 97.4°F | Resp 18 | Ht 61.0 in | Wt 145.8 lb

## 2011-06-24 DIAGNOSIS — T8189XA Other complications of procedures, not elsewhere classified, initial encounter: Secondary | ICD-10-CM

## 2011-06-24 NOTE — Progress Notes (Signed)
Subjective:     Patient ID: Priscilla [name], female   DOB: 06-Apr-1930, 78 y.o.   MRN: [id number]  HPI She is here for a followup visit of the wound. She is still doing hydrogel daily. She has no complaints.  Review of Systems     Objective:   Physical Exam    On exam, the wound continues to contract. There is excellent granulation tissue. I treated with silver nitrate. Assessment:     Nonhealing surgical wound    Plan:     We will continue the current wound care. This will still take time for it to close and I reassured the family. I will see her back in approximately 4 weeks

## 2011-07-25 ENCOUNTER — Ambulatory Visit (INDEPENDENT_AMBULATORY_CARE_PROVIDER_SITE_OTHER): Payer: Medicare Other | Admitting: Surgery

## 2011-07-25 ENCOUNTER — Encounter (INDEPENDENT_AMBULATORY_CARE_PROVIDER_SITE_OTHER): Payer: Self-pay | Admitting: Surgery

## 2011-07-25 VITALS — BP 122/62 | HR 76 | Temp 98.7°F | Resp 14 | Ht 61.0 in | Wt 145.2 lb

## 2011-07-25 DIAGNOSIS — T8189XA Other complications of procedures, not elsewhere classified, initial encounter: Secondary | ICD-10-CM | POA: Diagnosis not present

## 2011-07-25 NOTE — Progress Notes (Signed)
Subjective:     Patient ID: Priscilla [name], female   DOB: 02/18/1931, 78 y.o.   MRN: [id number]  HPI She has no complaints. She is still doing hydrogel dressing changes daily to the midline wound.  Review of Systems     Objective:   Physical Exam    Today, the wound is contracting much further. I again treated the granulation tissue with silver nitrate. I then repacked Assessment:     Nonhealing surgical wound    Plan:     I think it is improving much better. We will continue hydrogel. I will see her back in 4 weeks

## 2011-08-12 ENCOUNTER — Ambulatory Visit (INDEPENDENT_AMBULATORY_CARE_PROVIDER_SITE_OTHER): Payer: Medicare Other | Admitting: Family Medicine

## 2011-08-12 ENCOUNTER — Encounter: Payer: Self-pay | Admitting: Family Medicine

## 2011-08-12 VITALS — BP 150/60 | HR 78 | Temp 98.5°F | Ht 61.0 in | Wt 149.2 lb

## 2011-08-12 DIAGNOSIS — S31109A Unspecified open wound of abdominal wall, unspecified quadrant without penetration into peritoneal cavity, initial encounter: Secondary | ICD-10-CM | POA: Diagnosis not present

## 2011-08-12 DIAGNOSIS — C801 Malignant (primary) neoplasm, unspecified: Secondary | ICD-10-CM | POA: Insufficient documentation

## 2011-08-12 DIAGNOSIS — M1990 Unspecified osteoarthritis, unspecified site: Secondary | ICD-10-CM | POA: Diagnosis not present

## 2011-08-12 DIAGNOSIS — I119 Hypertensive heart disease without heart failure: Secondary | ICD-10-CM

## 2011-08-12 NOTE — Progress Notes (Signed)
Subjective:    Patient ID: Priscilla [name], female    DOB: 1931-03-07, 78 y.o.   MRN: [id number]  HPI CC: new pt establish  H/o colon and rectal cancer s/p multiple surgeries (2001 colon, 2008 rectal, 2012 small intestine), latest was small intestine surgery for blockage 01/2011, led to 1 mo hospitalization, 6 mo slow healing process, daily packing and dressing changes by daughter.  Had infection so healing by secondary intention.  S/p C diff infection in hospital as well.  Now with normalizing  BMs, no fevers/chills.  No GI doctor.  HTN - notices elevated blood pressure over las 6 months.  No HA, vision changes, CP/tightness, SOB, leg swelling. BP Readings from Last 3 Encounters:  08/12/11 150/60  07/25/11 122/62  06/24/11 152/68   Preventative: unsure last CPE.  Last blood work was done with Dr. Patty Sermons in March. Pap smear - 2010 (thinks)  S/p hysterectomy for fibroid tumors.  Doesn't need rpt, discussed. Mammogram - 2012 (thinks).  Done at [facility], will schedule. pneumonia shot 2009 Unsure last tetanus.  Caffeine: decaf Lives alone, daughter lives nearby to visit.  Widow for 16 yrs. Occupation: retired, worked at [employer] office Edu: HS Activity: tries to get walking, walks up and down steps at home Diet:   Cards: Dr. Patty Sermons Surgeon: Dr. Rayburn Ma  Medications and allergies reviewed and updated in chart.  Past histories reviewed and updated if relevant as below. Patient Active Problem List  Diagnoses  . History of valvular heart disease  . Benign hypertensive heart disease without heart failure  . Osteoarthritis  . History of colon cancer  . Rectal cancer  . Acute renal failure  . Dehydration  . Diarrhea  . Enteritis  . Hypotension  . Hypokalemia  . SBO (small bowel obstruction)  . Anemia due to blood loss, acute  . C. difficile colitis   Past Medical History  Diagnosis Date  . Hypertension   . Chest pain, atypical   . IDA (iron deficiency anemia)   . Heart  murmur   . History of DVT (deep vein thrombosis) 2002    right leg, after chemo for first cancer, treated  . Osteoarthritis   . Cancer 2001, 2008    colon, rectal  . Small bowel obstruction 2012    adhesion related  . History of chicken pox    Past Surgical History  Procedure Date  . Nuclear stress test 11/17/08    NORMAL  . Replacement total knee 2010    RIGHT KNEE  . Colonoscopy 02/2010  . Ileostomy Q  . Colon surgery 2001 and 2008  . Appendectomy 1984  . Colectomy   . Colostomy   . Abdominal hysterectomy 1984  . Joint replacement 2010    knee   . Laparotomy 02/05/2011    Procedure: EXPLORATORY LAPAROTOMY;  Surgeon: Shelly Rubenstein, MD;  Location: [facility] OR;  Service: General;  Laterality: N/A;  lysis of adhesions.  . Bowel resection 02/05/2011    Procedure: SMALL BOWEL RESECTION;  Surgeon: Shelly Rubenstein, MD;  Location: [facility] OR;  Service: General;  Laterality: N/A;  . Esophagogastroduodenoscopy 01/31/2011    Procedure: ESOPHAGOGASTRODUODENOSCOPY (EGD);  Surgeon: Barrie Folk, MD;  Location: [facility] ENDOSCOPY;  Service: Endoscopy;  Laterality: N/A;  duodenal bx;s r/o giardia, r/o celiac diease  . Flexible sigmoidoscopy 01/31/2011    Procedure: FLEXIBLE SIGMOIDOSCOPY;  Surgeon: Barrie Folk, MD;  Location: [facility] ENDOSCOPY;  Service: Endoscopy;  Laterality: N/A;   History  Substance Use Topics  .  Smoking status: Never Smoker   . Smokeless tobacco: Never Used  . Alcohol Use: No   Family History  Problem Relation Age of Onset  . Heart disease Mother     CHF, valve problems  . Heart disease Father     due to lupus  . Lupus Father   . Cancer Paternal Grandmother     colon  . Coronary artery disease Brother     s/p CABG  . Coronary artery disease Sister     s/p CABG  . Diabetes Neg Hx    Allergies  Allergen Reactions  . Morphine And Related Nausea And Vomiting  . Lisinopril Cough   Current Outpatient Prescriptions on File Prior to Visit  Medication Sig Dispense Refill    . aspirin 81 MG tablet Take 81 mg by mouth daily.        Marland Kitchen BIOTIN PO Take 5,000 mg by mouth daily.       . calcium carbonate (OS-CAL) 600 MG TABS Take 600 mg by mouth daily.      . Diphenhydramine-APAP, sleep, (TYLENOL PM EXTRA STRENGTH PO) Take 1-2 tablets by mouth at bedtime. For sleep       . Multiple Vitamins-Iron (MULTIVITAMINS WITH IRON) TABS Take 1 tablet by mouth daily with supper.  1 tablet    . nitroGLYCERIN (NITROSTAT) 0.4 MG SL tablet Place 0.4 mg under the tongue every 5 (five) minutes as needed. For chest pain.         Review of Systems  Constitutional: Negative for fever, chills, activity change, appetite change, fatigue and unexpected weight change.  HENT: Negative for hearing loss and neck pain.   Eyes: Negative for visual disturbance.  Respiratory: Negative for cough, chest tightness, shortness of breath and wheezing.   Cardiovascular: Negative for chest pain, palpitations and leg swelling.  Gastrointestinal: Negative for nausea, vomiting, abdominal pain, diarrhea, constipation and blood in stool.  Genitourinary: Negative for hematuria and difficulty urinating.  Musculoskeletal: Negative for myalgias and arthralgias.  Skin: Negative for rash.  Neurological: Negative for dizziness, seizures, syncope and headaches.  Hematological: Bruises/bleeds easily.  Psychiatric/Behavioral: Negative for dysphoric mood. The patient is not nervous/anxious.        Objective:   Physical Exam  Nursing note and vitals reviewed. Constitutional: She is oriented to person, place, and time. She appears well-developed and well-nourished. No distress.  HENT:  Head: Normocephalic and atraumatic.  Right Ear: Hearing, tympanic membrane, external ear and ear canal normal.  Left Ear: Hearing, tympanic membrane, external ear and ear canal normal.  Nose: Nose normal.  Mouth/Throat: Oropharynx is clear and moist.  Eyes: Conjunctivae and EOM are normal. Pupils are equal, round, and reactive to  light.  Neck: Normal range of motion. Neck supple. No thyromegaly present.  Cardiovascular: Normal rate, regular rhythm, normal heart sounds and intact distal pulses.   No murmur heard. Pulses:      Radial pulses are 2+ on the right side, and 2+ on the left side.       No murmur appreciated  Pulmonary/Chest: Effort normal and breath sounds normal. No respiratory distress. She has no wheezes. She has no rales.  Abdominal: Soft. Bowel sounds are normal. She exhibits no distension and no mass. There is no tenderness. There is no rebound and no guarding.       Midline incision closing by second intention, dressings c/d/i  Musculoskeletal: Normal range of motion. She exhibits no edema.  Lymphadenopathy:    She has no cervical adenopathy.  Neurological: She is alert and oriented to person, place, and time.       CN grossly intact, station and gait intact  Skin: Skin is warm and dry. No rash noted.  Psychiatric: She has a normal mood and affect. Her behavior is normal. Judgment and thought content normal.      Assessment & Plan:

## 2011-08-12 NOTE — Assessment & Plan Note (Signed)
Currently on tylenol prn.

## 2011-08-12 NOTE — Assessment & Plan Note (Signed)
Chronic.  Somewhat elevated today, meeting new doctor. Has been stable in past. Asked to keep track at local pharmacy and call me with numbers in 1 mo.  If staying elevated, increase HCTZ. Reports compliance with meds.

## 2011-08-12 NOTE — Patient Instructions (Signed)
Keep track of blood pressures at local pharmacy and give me a call with numbers next month. Return at your convenience for medicare wellness check. Good to see you today, call us with questions.

## 2011-08-12 NOTE — Assessment & Plan Note (Signed)
Longstanding, 6 months.  Dressing changes daily with packing by daughter. Followed by [facility]. Continue to monitor, seems to be slowly healing. Today wound looks good.

## 2011-08-14 ENCOUNTER — Other Ambulatory Visit: Payer: Self-pay | Admitting: Cardiology

## 2011-08-14 NOTE — Telephone Encounter (Signed)
..   Requested Prescriptions   Signed Prescriptions Disp Refills  . atenolol (TENORMIN) 50 MG tablet 180 tablet 3    Sig: TAKE 1 TABLET BY MOUTH TWICE A DAY    Authorizing Provider: Cassell Clement    Ordering User: Jenelle Mages, Cassundra Mckeever S  . hydrochlorothiazide (MICROZIDE) 12.5 MG capsule 90 capsule 3    Sig: TAKE ONE CAPSULE BY MOUTH EVERY DAY    Authorizing Provider: Cassell Clement    Ordering User: Lacie Scotts

## 2011-08-22 ENCOUNTER — Ambulatory Visit (INDEPENDENT_AMBULATORY_CARE_PROVIDER_SITE_OTHER): Payer: Medicare Other | Admitting: Surgery

## 2011-08-22 VITALS — BP 164/89 | HR 72 | Temp 97.6°F | Resp 16 | Ht 61.0 in | Wt 147.4 lb

## 2011-08-22 DIAGNOSIS — T8189XA Other complications of procedures, not elsewhere classified, initial encounter: Secondary | ICD-10-CM | POA: Diagnosis not present

## 2011-08-22 NOTE — Progress Notes (Signed)
Subjective:     Patient ID: Priscilla [name], female   DOB: 1930/12/01, 78 y.o.   MRN: [id number]  HPI She has no complaints. She continues to wound care daily with hydrogel to the midline wound  Review of Systems     Objective:   Physical Exam The wound continues to slowly contracted midline. It is now less than the size of a quarter. I again treated with silver nitrate. There was no evidence of infection    Assessment:     Nonhealing surgical wound    Plan:     She will continue the hydrogel daily. I gave him several nitrate to try every 3-4 days. I will see her back in 4 weeks

## 2011-08-27 DIAGNOSIS — Z1231 Encounter for screening mammogram for malignant neoplasm of breast: Secondary | ICD-10-CM | POA: Diagnosis not present

## 2011-08-28 ENCOUNTER — Encounter: Payer: Self-pay | Admitting: Family Medicine

## 2011-08-29 ENCOUNTER — Encounter: Payer: Self-pay | Admitting: *Deleted

## 2011-08-29 ENCOUNTER — Encounter: Payer: Self-pay | Admitting: Family Medicine

## 2011-09-08 ENCOUNTER — Telehealth: Payer: Self-pay | Admitting: Family Medicine

## 2011-09-08 NOTE — Telephone Encounter (Signed)
Noted. Will see then.  bp remaining elevated.

## 2011-09-08 NOTE — Telephone Encounter (Signed)
Caller: Priscilla [name]/Patient; PCP: Eustaquio Boyden; CB#: [phone]; ; ; Call regarding Patient Seen in Office Once and Dr. Sharen Hones Asked Her To Call Back in 4. Weeks and Give Her BP Readings Then Make A. Wellness Appointment. BP 1st Week 162/62, 2nd Week 164/89, 3rd Week 180/70 and 4th Week 165/65.;  Made pt appt for 6/20 at 1130 (told pt 1115) per Bonita Quin in office for Wellness visit per MD. Pt is asymptomatic now. Call back parameters given for elevated pressures.

## 2011-09-11 ENCOUNTER — Ambulatory Visit (INDEPENDENT_AMBULATORY_CARE_PROVIDER_SITE_OTHER): Payer: Medicare Other | Admitting: Family Medicine

## 2011-09-11 ENCOUNTER — Encounter: Payer: Self-pay | Admitting: Family Medicine

## 2011-09-11 VITALS — BP 169/62 | HR 59 | Temp 98.5°F | Ht 61.0 in | Wt 147.2 lb

## 2011-09-11 DIAGNOSIS — Z0000 Encounter for general adult medical examination without abnormal findings: Secondary | ICD-10-CM | POA: Insufficient documentation

## 2011-09-11 DIAGNOSIS — Z780 Asymptomatic menopausal state: Secondary | ICD-10-CM

## 2011-09-11 DIAGNOSIS — Z23 Encounter for immunization: Secondary | ICD-10-CM | POA: Diagnosis not present

## 2011-09-11 DIAGNOSIS — I119 Hypertensive heart disease without heart failure: Secondary | ICD-10-CM

## 2011-09-11 DIAGNOSIS — S31109A Unspecified open wound of abdominal wall, unspecified quadrant without penetration into peritoneal cavity, initial encounter: Secondary | ICD-10-CM | POA: Diagnosis not present

## 2011-09-11 LAB — LIPID PANEL
HDL: 79.8 mg/dL (ref >39.00)
LDL Cholesterol: 70 mg/dL (ref 0–99)
Total CHOL/HDL Ratio: 2
Triglycerides: 127 mg/dL (ref 0.0–149.0)

## 2011-09-11 LAB — COMPREHENSIVE METABOLIC PANEL
ALT: 20 U/L (ref 0–35)
AST: 22 U/L (ref 0–37)
Alkaline Phosphatase: 87 U/L (ref 39–117)
Creatinine, Ser: 0.8 mg/dL (ref 0.4–1.2)
Total Bilirubin: 0.6 mg/dL (ref 0.3–1.2)

## 2011-09-11 MED ORDER — LOSARTAN POTASSIUM-HCTZ 50-12.5 MG PO TABS: 1.0000 | ORAL_TABLET | Freq: Every day | ORAL | Status: DC

## 2011-09-11 NOTE — Patient Instructions (Addendum)
Stop hydrochlorothiazide 12.5mg .  Start hyzaar (losartan/hctz 50/12.5 mg) once daily. Blood work today. Tetanus shot today. Pass by [facility] office for referral for bone scan. Good to see you today, call us with quesitons.

## 2011-09-11 NOTE — Assessment & Plan Note (Signed)
Longstanding. Continued dressing changes daily.

## 2011-09-11 NOTE — Progress Notes (Signed)
Subjective:    Patient ID: Priscilla [name], female    DOB: Apr 02, 1930, 78 y.o.   MRN: [id number]  HPI CC: medicare wellness visit  Wound after surgery - sees surgeon, no longer draining (Priscilla [name]).  F/u pending in 2 wks.  HTN - blood pressures have been staying elevated at home - see recent phone note.  Compliant with meds - atenolol bid and hctz 12.5  Hearing screen normal today. Vision screen ok.  Last vision exam was about 1 yr ago.  Has appt coming up.  Gets yearly checks.  Denies falls in last year. Endorses some depressed mood - will provide PHQ9.  Denies anhedonia.  Involved in church.  Preventative:  Last blood work was done with Dr. Patty [name] in March.  Pap smear - 2010 (thinks). S/p hysterectomy for fibroid tumors. Doesn't need rpt, discussed.  Mammogram - 08/2011 WNL.  Done at [facility] center. H/o colon and rectal cancer.  Followed by surgery.  H/o bowel resection after SBO. pneumonia shot 2009  Declines shingles shot.  Has had shingles Unsure last tetanus - would like Td today. Has not had recent DEXA, thinks about 20 yrs ago, told normal. Advanced directives - has living will at home.  HCPOA would be son Priscilla [name]) and daughter Priscilla [name]), also would be ok with other children helping.  Caffeine: decaf  Lives alone, daughter lives nearby to visit. Widow for 16 yrs.  Occupation: retired, worked at [employer] office  Edu: HS  Activity: tries to get walking, walks up and down steps at home  Diet:   Medications and allergies reviewed and updated in chart.  Past histories reviewed and updated if relevant as below. Patient Active Problem List  Diagnosis  . History of valvular heart disease  . Benign hypertensive heart disease without heart failure  . Osteoarthritis  . History of colon cancer  . Rectal cancer  . Acute renal failure  . Dehydration  . Diarrhea  . Enteritis  . Hypotension  . Hypokalemia  . SBO (small bowel obstruction)  . Anemia due to blood loss, acute  . C.  difficile colitis  . Open abdominal wall wound  . Cancer   Past Medical History  Diagnosis Date  . Hypertension   . Chest pain, atypical   . IDA (iron deficiency anemia)   . Heart murmur   . History of DVT (deep vein thrombosis) 2002    right leg, after chemo for first cancer, treated  . Osteoarthritis   . Cancer 2001, 2008    colon, rectal  . Small bowel obstruction 2012    adhesion related  . History of chicken pox    Past Surgical History  Procedure Date  . Nuclear stress test 11/17/08    NORMAL  . Replacement total knee 2010    RIGHT KNEE  . Colonoscopy 02/2010  . Ileostomy Q  . Colon surgery 2001 and 2008  . Appendectomy 1984  . Colectomy   . Colostomy   . Abdominal hysterectomy 1984  . Joint replacement 2010    knee   . Laparotomy 02/05/2011    Procedure: EXPLORATORY LAPAROTOMY;  Surgeon: Priscilla Rubenstein, MD;  Location: [facility] OR;  Service: General;  Laterality: N/A;  lysis of adhesions.  . Bowel resection 02/05/2011    Procedure: SMALL BOWEL RESECTION;  Surgeon: Priscilla Rubenstein, MD;  Location: [facility] OR;  Service: General;  Laterality: N/A;  . Esophagogastroduodenoscopy 01/31/2011    Procedure: ESOPHAGOGASTRODUODENOSCOPY (EGD);  Surgeon: Priscilla Folk, MD;  Location: [facility]  ENDOSCOPY;  Service: Endoscopy;  Laterality: N/A;  duodenal bx;s r/o giardia, r/o celiac diease  . Flexible sigmoidoscopy 01/31/2011    Procedure: FLEXIBLE SIGMOIDOSCOPY;  Surgeon: Priscilla Folk, MD;  Location: [facility] ENDOSCOPY;  Service: Endoscopy;  Laterality: N/A;   History  Substance Use Topics  . Smoking status: Never Smoker   . Smokeless tobacco: Never Used  . Alcohol Use: No   Family History  Problem Relation Age of Onset  . Heart disease Mother     CHF, valve problems  . Heart disease Father     due to lupus  . Lupus Father   . Cancer Paternal Grandmother     colon  . Coronary artery disease Brother     s/p CABG  . Coronary artery disease Sister     s/p CABG  . Diabetes Neg Hx     Allergies  Allergen Reactions  . Morphine And Related Nausea And Vomiting  . Lisinopril Cough   Current Outpatient Prescriptions on File Prior to Visit  Medication Sig Dispense Refill  . aspirin 81 MG tablet Take 81 mg by mouth daily.        Marland Kitchen atenolol (TENORMIN) 50 MG tablet TAKE 1 TABLET BY MOUTH TWICE A DAY  180 tablet  3  . BIOTIN PO Take 5,000 mg by mouth daily.       . calcium carbonate (OS-CAL) 600 MG TABS Take 600 mg by mouth daily.      . Diphenhydramine-APAP, sleep, (TYLENOL PM EXTRA STRENGTH PO) Take 1 tablet by mouth at bedtime. For sleep      . hydrochlorothiazide (MICROZIDE) 12.5 MG capsule TAKE ONE CAPSULE BY MOUTH EVERY DAY  90 capsule  3  . Multiple Vitamins-Iron (MULTIVITAMINS WITH IRON) TABS Take 1 tablet by mouth daily with supper.  1 tablet    . nitroGLYCERIN (NITROSTAT) 0.4 MG SL tablet Place 0.4 mg under the tongue every 5 (five) minutes as needed. For chest pain.         Review of Systems  Constitutional: Negative for fever, chills, activity change, appetite change, fatigue and unexpected weight change.  HENT: Negative for hearing loss and neck pain.   Eyes: Negative for visual disturbance.  Respiratory: Negative for cough, chest tightness, shortness of breath and wheezing.   Cardiovascular: Negative for chest pain, palpitations and leg swelling.  Gastrointestinal: Negative for nausea, vomiting, abdominal pain, diarrhea, constipation, blood in stool and abdominal distention.  Genitourinary: Negative for hematuria and difficulty urinating.  Musculoskeletal: Negative for myalgias and arthralgias.  Skin: Negative for rash.  Neurological: Negative for dizziness, seizures, syncope and headaches.  Hematological: Does not bruise/bleed easily.  Psychiatric/Behavioral: Negative for dysphoric mood. The patient is not nervous/anxious.        Objective:   Physical Exam  Nursing note and vitals reviewed. Constitutional: She is oriented to person, place, and time.  She appears well-developed and well-nourished. No distress.  HENT:  Head: Normocephalic and atraumatic.  Right Ear: Hearing, tympanic membrane, external ear and ear canal normal.  Left Ear: Hearing, tympanic membrane, external ear and ear canal normal.  Nose: Nose normal.  Mouth/Throat: Oropharynx is clear and moist. No oropharyngeal exudate.  Eyes: Conjunctivae and EOM are normal. Pupils are equal, round, and reactive to light. No scleral icterus.  Neck: Normal range of motion. Neck supple. Carotid bruit is not present.  Cardiovascular: Normal rate, regular rhythm, normal heart sounds and intact distal pulses.   No murmur heard. Pulses:      Radial pulses  are 2+ on the right side, and 2+ on the left side.  Pulmonary/Chest: Effort normal and breath sounds normal. No respiratory distress. She has no wheezes. She has no rales.  Abdominal: Soft. Bowel sounds are normal. She exhibits no distension and no mass. There is no tenderness. There is no rebound and no guarding.       Abdominal dressing c/d/i  Musculoskeletal: Normal range of motion. She exhibits no edema.  Lymphadenopathy:    She has no cervical adenopathy.  Neurological: She is alert and oriented to person, place, and time.       CN grossly intact, station and gait intact  Skin: Skin is warm and dry. No rash noted.  Psychiatric: She has a normal mood and affect. Her behavior is normal. Judgment and thought content normal.       Assessment & Plan:

## 2011-09-11 NOTE — Assessment & Plan Note (Addendum)
I have personally reviewed the Medicare Annual Wellness questionnaire and have noted 1. The patient's medical and social history 2. Their use of alcohol, tobacco or illicit drugs 3. Their current medications and supplements 4. The patient's functional ability including ADL's, fall risks, home safety risks and hearing or visual impairment. 5. Diet and physical activity 6. Evidence for depression or mood disorders The patients weight, height, BMI have been recorded in the chart.  Hearing and vision has been addressed. I have made referrals, counseling and provided education to the patient based review of the above and I have provided the pt with a written personalized care plan for preventive services. See scanned questionairre. Advanced directives discussed: has living will at home. HCPOA would be son [name]) and daughter [name]), also would be ok with other children helping.  Reviewed preventative protocols and updated unless pt declined. Followed by GI and surg for h/o colon and rectal cancer s/p surgery. Recent SBO s/p colon resection. Td today. Schedule DEXA scan as no recent values. Positive depression screen - however PHQ9 = 1-2.

## 2011-09-11 NOTE — Assessment & Plan Note (Signed)
bp remaining elevated. Start combo ACEI/HCTZ. Keep track of bp at home and let me know if running high.

## 2011-09-22 DIAGNOSIS — M8580 Other specified disorders of bone density and structure, unspecified site: Secondary | ICD-10-CM

## 2011-09-22 HISTORY — DX: Other specified disorders of bone density and structure, unspecified site: M85.80

## 2011-09-22 HISTORY — PX: OTHER SURGICAL HISTORY: SHX169

## 2011-09-24 ENCOUNTER — Encounter (INDEPENDENT_AMBULATORY_CARE_PROVIDER_SITE_OTHER): Payer: Self-pay | Admitting: Surgery

## 2011-09-24 ENCOUNTER — Ambulatory Visit (INDEPENDENT_AMBULATORY_CARE_PROVIDER_SITE_OTHER): Payer: Medicare Other | Admitting: Surgery

## 2011-09-24 VITALS — BP 128/68 | HR 67 | Temp 97.6°F | Ht 61.0 in | Wt 147.8 lb

## 2011-09-24 DIAGNOSIS — T8189XA Other complications of procedures, not elsewhere classified, initial encounter: Secondary | ICD-10-CM

## 2011-09-24 NOTE — Progress Notes (Signed)
Subjective:     Patient ID: Priscilla [name], female   DOB: [birth year], 78 y.o.   MRN: [id number]  HPI She is here for another wound check. She has no complaints. They are doing the hydrogel and intermittently putting silver nitrate on the wound.  Review of Systems     Objective:   Physical Exam On exam, the wound continues to contract nicely. It is now the size of a nickel    Assessment:     Nonhealing surgical wound    Plan:     We will continue the current wound care and I will see her back in one month

## 2011-10-02 DIAGNOSIS — Z13820 Encounter for screening for osteoporosis: Secondary | ICD-10-CM | POA: Diagnosis not present

## 2011-10-07 ENCOUNTER — Encounter: Payer: Self-pay | Admitting: Family Medicine

## 2011-10-10 ENCOUNTER — Encounter: Payer: Self-pay | Admitting: Family Medicine

## 2011-10-17 DIAGNOSIS — H2510 Age-related nuclear cataract, unspecified eye: Secondary | ICD-10-CM | POA: Diagnosis not present

## 2011-10-28 ENCOUNTER — Ambulatory Visit (INDEPENDENT_AMBULATORY_CARE_PROVIDER_SITE_OTHER): Payer: Medicare Other | Admitting: Surgery

## 2011-10-28 ENCOUNTER — Encounter (INDEPENDENT_AMBULATORY_CARE_PROVIDER_SITE_OTHER): Payer: Self-pay | Admitting: Surgery

## 2011-10-28 VITALS — BP 160/70 | HR 60 | Resp 18 | Ht 61.0 in | Wt 149.0 lb

## 2011-10-28 DIAGNOSIS — T8189XA Other complications of procedures, not elsewhere classified, initial encounter: Secondary | ICD-10-CM

## 2011-10-28 NOTE — Progress Notes (Signed)
Subjective:     Patient ID: Priscilla [name], female   DOB: [birth year], 78 y.o.   MRN: [id number]  HPI She is here today for another wound check. She still doing hydrogel to the midline wound. She has no complaints  Review of Systems     Objective:   Physical Exam On exam, the skin continues to slowly grow over the granulation tissue. The wound continues to get smaller.I again treated with silver nitrate    Assessment:     Nonhealing surgical wound    Plan:     She will continue the local wound care. I will see her back in one month.

## 2011-10-30 ENCOUNTER — Other Ambulatory Visit (HOSPITAL_COMMUNITY): Payer: Self-pay | Admitting: Orthopedic Surgery

## 2011-10-30 DIAGNOSIS — M25569 Pain in unspecified knee: Secondary | ICD-10-CM | POA: Diagnosis not present

## 2011-10-30 DIAGNOSIS — M25561 Pain in right knee: Secondary | ICD-10-CM

## 2011-10-30 DIAGNOSIS — M25562 Pain in left knee: Secondary | ICD-10-CM

## 2011-10-30 DIAGNOSIS — M25579 Pain in unspecified ankle and joints of unspecified foot: Secondary | ICD-10-CM | POA: Diagnosis not present

## 2011-10-30 DIAGNOSIS — C189 Malignant neoplasm of colon, unspecified: Secondary | ICD-10-CM

## 2011-11-04 ENCOUNTER — Telehealth (INDEPENDENT_AMBULATORY_CARE_PROVIDER_SITE_OTHER): Payer: Self-pay | Admitting: General Surgery

## 2011-11-04 NOTE — Telephone Encounter (Signed)
Message copied by Wilder Glade on Tue Nov 04, 2011  9:35 AM ------      Message from: Erin Sons      Created: Tue Nov 04, 2011  9:27 AM      Regarding: Dr Aneta Mins need you to call her she has a question concerning her mom's open wound. [phone]. She stated it wasn't urgent.            Thanks

## 2011-11-04 NOTE — Telephone Encounter (Signed)
Called pt daughter back and spoke with her about her mother wound care and the silver nitrate when she place it in the wound and the wound will have soupy tissue in it and she was questioning does she take out the soupy tissue and I spoke to Dr [name] and he stated yes pt has a up coming appt to see Dr [name] on 12-01-11

## 2011-11-05 ENCOUNTER — Encounter (HOSPITAL_COMMUNITY)
Admission: RE | Admit: 2011-11-05 | Discharge: 2011-11-05 | Disposition: A | Discharge status: Home / Self Care | Payer: Medicare Other | Source: Ambulatory Visit | Attending: Orthopedic Surgery | Admitting: Orthopedic Surgery

## 2011-11-05 DIAGNOSIS — M25569 Pain in unspecified knee: Secondary | ICD-10-CM | POA: Insufficient documentation

## 2011-11-05 DIAGNOSIS — M25561 Pain in right knee: Secondary | ICD-10-CM

## 2011-11-05 DIAGNOSIS — C189 Malignant neoplasm of colon, unspecified: Secondary | ICD-10-CM | POA: Diagnosis not present

## 2011-11-05 MED ORDER — TECHNETIUM TC 99M MEDRONATE IV KIT
25.0000 | PACK | Freq: Once | INTRAVENOUS | Status: AC | PRN
  Administered 2011-11-05: 25 via INTRAVENOUS

## 2011-11-13 DIAGNOSIS — M25569 Pain in unspecified knee: Secondary | ICD-10-CM | POA: Diagnosis not present

## 2011-12-01 ENCOUNTER — Encounter (INDEPENDENT_AMBULATORY_CARE_PROVIDER_SITE_OTHER): Payer: Self-pay | Admitting: Surgery

## 2011-12-01 ENCOUNTER — Ambulatory Visit (INDEPENDENT_AMBULATORY_CARE_PROVIDER_SITE_OTHER): Payer: Medicare Other | Admitting: Surgery

## 2011-12-01 VITALS — BP 144/72 | HR 74 | Temp 97.3°F | Resp 16 | Ht 61.0 in | Wt 151.1 lb

## 2011-12-01 DIAGNOSIS — T8189XA Other complications of procedures, not elsewhere classified, initial encounter: Secondary | ICD-10-CM | POA: Diagnosis not present

## 2011-12-01 NOTE — Progress Notes (Signed)
Subjective:     Patient ID: Priscilla [name], female   DOB: 01/10/1931, 78 y.o.   MRN: [id number]  HPI She is here for another wound check. She has no complaints other than the wound continues to close slowly. She continues hydrogel dressing changes daily.  Review of Systems     Objective:   Physical Exam The wound is now more than halfway covered with skin and continues to improve. The exposed granulation tissue is pink and healthy. I again treated with silver nitrate.    Assessment:     Nonhealing surgical wound    Plan:     She will continue hydrogel daily and I will see her back in one month

## 2011-12-05 ENCOUNTER — Other Ambulatory Visit: Payer: Self-pay | Admitting: *Deleted

## 2011-12-05 MED ORDER — LOSARTAN POTASSIUM-HCTZ 50-12.5 MG PO TABS: 1.0000 | ORAL_TABLET | Freq: Every day | ORAL | Status: DC

## 2011-12-11 ENCOUNTER — Encounter: Payer: Self-pay | Admitting: Family Medicine

## 2011-12-11 ENCOUNTER — Other Ambulatory Visit: Payer: Self-pay | Admitting: Family Medicine

## 2011-12-11 ENCOUNTER — Ambulatory Visit (INDEPENDENT_AMBULATORY_CARE_PROVIDER_SITE_OTHER): Payer: Medicare Other | Admitting: Family Medicine

## 2011-12-11 VITALS — BP 170/60 | HR 60 | Temp 98.5°F | Wt 151.2 lb

## 2011-12-11 DIAGNOSIS — Z23 Encounter for immunization: Secondary | ICD-10-CM | POA: Diagnosis not present

## 2011-12-11 DIAGNOSIS — I119 Hypertensive heart disease without heart failure: Secondary | ICD-10-CM

## 2011-12-11 LAB — BASIC METABOLIC PANEL
BUN: 21 mg/dL (ref 6–23)
CO2: 28 mEq/L (ref 19–32)
Chloride: 102 mEq/L (ref 96–112)
Creatinine, Ser: 0.9 mg/dL (ref 0.4–1.2)

## 2011-12-11 MED ORDER — LOSARTAN POTASSIUM-HCTZ 100-12.5 MG PO TABS: 1.0000 | ORAL_TABLET | Freq: Every day | ORAL | Status: DC

## 2011-12-11 NOTE — Patient Instructions (Addendum)
Blood work today to check on kidneys.  If normal, I would like to increase water pill component of hyzaar - we will call you with plan after blood work returns. Buy blood pressure cuff and keep track of numbers at home (once a week) Follow up with Dr. Patty Sermons. Good to see you today, call us with questions. Flu shot today.

## 2011-12-11 NOTE — Assessment & Plan Note (Signed)
Chronic, uncontrolled. Last visit started Hyzaar 2/2 elevated BPs. Continue elevated.   Asked her to buy bp cuff and keep track of this at home. Check Cr again today - if stable, increase hyzaar to 50/25mg  daily.  Will call her with results.

## 2011-12-11 NOTE — Progress Notes (Addendum)
  Subjective:    Patient ID: Priscilla [name], female    DOB: August 27, 1930, 78 y.o.   MRN: [id number]  HPI CC: f/u HTN  HTN - No HA, vision changes, CP/tightness, leg swelling.  Some SOB with exhertion, but manages ok.  No orthopnea.  1 pillow at night.  Doesn't have cuff at home.  Prior saw Dr. Patty Sermons cards.  Has not f/u with him.  Knee arthritis  S/p replacement.  Started seeing Dr. (replacement done by Dr. Marciano Sequin who retired).  Bone scan done - no infection in knee.  Has f/u with him in 6 mo.  Wound doing well.  No evidence of infection.  Sees Dr. Rayburn Ma regularly.  Review of Systems Per HPI    Objective:   Physical Exam  Nursing note and vitals reviewed. Constitutional: She appears well-developed and well-nourished. No distress.  HENT:  Head: Normocephalic and atraumatic.  Mouth/Throat: Oropharynx is clear and moist. No oropharyngeal exudate.  Eyes: Conjunctivae normal and EOM are normal. Pupils are equal, round, and reactive to light. No scleral icterus.  Neck: Normal range of motion. Neck supple. Carotid bruit is not present.  Cardiovascular: Normal rate, regular rhythm and intact distal pulses.   Murmur (mild SEM best at LUSB) heard. Pulmonary/Chest: Effort normal and breath sounds normal. No respiratory distress. She has no wheezes. She has no rales.  Abdominal:       Midline wound with dressing c/d/i, removed.  No erythema surrounding wound.  Musculoskeletal: She exhibits no edema.  Lymphadenopathy:    She has no cervical adenopathy.  Skin: Skin is warm and dry. No rash noted.       Assessment & Plan:

## 2011-12-19 ENCOUNTER — Telehealth: Payer: Self-pay | Admitting: *Deleted

## 2011-12-19 ENCOUNTER — Other Ambulatory Visit (INDEPENDENT_AMBULATORY_CARE_PROVIDER_SITE_OTHER): Payer: Medicare Other

## 2011-12-19 DIAGNOSIS — I119 Hypertensive heart disease without heart failure: Secondary | ICD-10-CM

## 2011-12-19 NOTE — Telephone Encounter (Signed)
Sounds resolved.  Monitor for now.  Be sure to let Dr. Rayburn Ma know next week.  If rpt episode over weekend, consider seeking care. Did not check blood count today. Lab Results  Component Value Date   WBC 5.2 06/13/2011   HGB 11.9* 06/13/2011   HCT 35.3* 06/13/2011   MCV 89.3 06/13/2011   PLT 334.0 06/13/2011

## 2011-12-19 NOTE — Telephone Encounter (Signed)
Patient notified

## 2011-12-19 NOTE — Telephone Encounter (Signed)
Patient came in for labs today. She mentioned to Terri that she had 1 episode of BRB with BM yesterday. I spoke with patient and she said it only happened once yesterday. She has had BM's since with no blood noted. She has no abd pain, fever or weakness. Skin color was good and patient said she felt fine. Offered her appt with another physician this AM due to her PMHx and she refused because she had other plans today. I advised that I would call her back with your recommendations. She has a follow up with Dr. Rayburn Ma next week.

## 2011-12-30 ENCOUNTER — Encounter (INDEPENDENT_AMBULATORY_CARE_PROVIDER_SITE_OTHER): Payer: Self-pay | Admitting: Surgery

## 2011-12-30 ENCOUNTER — Ambulatory Visit (INDEPENDENT_AMBULATORY_CARE_PROVIDER_SITE_OTHER): Payer: Medicare Other | Admitting: Surgery

## 2011-12-30 VITALS — BP 159/82 | HR 86 | Temp 98.6°F | Resp 16 | Ht 61.0 in | Wt 149.8 lb

## 2011-12-30 DIAGNOSIS — T8189XA Other complications of procedures, not elsewhere classified, initial encounter: Secondary | ICD-10-CM

## 2011-12-30 NOTE — Progress Notes (Signed)
Subjective:     Patient ID: Priscilla [name], female   DOB: 1930/04/21, 78 y.o.   MRN: [id number]  HPI She is here today for another wound check. She is still doing hydrogel dressing changes. She has occasional abdominal wall discomfort but no obstructive symptoms  Review of Systems     Objective:   Physical Exam On exam, the wound is almost completely healed except for one tiny open area which I treated with silver nitrate    Assessment:     Nonhealing surgical wound    Plan:     She will continue the current wound care and I will see her back in one month. I suspect the wound will be totally healed by then

## 2012-01-27 ENCOUNTER — Encounter (INDEPENDENT_AMBULATORY_CARE_PROVIDER_SITE_OTHER): Payer: Self-pay | Admitting: Surgery

## 2012-01-27 ENCOUNTER — Ambulatory Visit (INDEPENDENT_AMBULATORY_CARE_PROVIDER_SITE_OTHER): Payer: Medicare Other | Admitting: Surgery

## 2012-01-27 VITALS — BP 156/66 | HR 64 | Temp 97.6°F | Ht 61.0 in | Wt 151.0 lb

## 2012-01-27 DIAGNOSIS — T8189XA Other complications of procedures, not elsewhere classified, initial encounter: Secondary | ICD-10-CM | POA: Diagnosis not present

## 2012-01-27 NOTE — Progress Notes (Signed)
Subjective:     Patient ID: Priscilla [name], female   DOB: Dec 07, 1930, 78 y.o.   MRN: [id number]  HPI She is back today for another wound check. She still has occasional drainage from the wound.  Review of Systems     Objective:   Physical Exam There is only a very tiny 2-3 mm area that is open but x-rayed with silver nitrate.    Assessment:     Nonhealing surgical wound    Plan:     She will continue the current care. It really should close up very soon

## 2012-02-09 ENCOUNTER — Ambulatory Visit (INDEPENDENT_AMBULATORY_CARE_PROVIDER_SITE_OTHER): Payer: Medicare Other | Admitting: Cardiology

## 2012-02-09 ENCOUNTER — Encounter: Payer: Self-pay | Admitting: Cardiology

## 2012-02-09 VITALS — BP 132/68 | HR 61 | Ht 61.0 in | Wt 150.0 lb

## 2012-02-09 DIAGNOSIS — Z8679 Personal history of other diseases of the circulatory system: Secondary | ICD-10-CM | POA: Diagnosis not present

## 2012-02-09 DIAGNOSIS — I119 Hypertensive heart disease without heart failure: Secondary | ICD-10-CM

## 2012-02-09 DIAGNOSIS — M1990 Unspecified osteoarthritis, unspecified site: Secondary | ICD-10-CM

## 2012-02-09 MED ORDER — NITROGLYCERIN 0.4 MG SL SUBL: 0.4000 mg | SUBLINGUAL_TABLET | SUBLINGUAL | Status: DC | PRN

## 2012-02-09 NOTE — Assessment & Plan Note (Signed)
The patient has not been experiencing any headaches or dizziness.  She is not having shortness of breath.  She denies chest pain.

## 2012-02-09 NOTE — Patient Instructions (Addendum)
Your physician recommends that you continue on your current medications as directed. Please refer to the Current Medication list given to you today.  Your physician wants you to follow-up in: 6 months ov/ekg  You will receive a reminder letter in the mail two months in advance. If you don't receive a letter, please call our office to schedule the follow-up appointment.  

## 2012-02-09 NOTE — Progress Notes (Signed)
Priscilla [name] Date of Birth:  1930-03-26 [facility] [address] [place], [place]  [address] [phone]         Fax   [phone]  History of Present Illness: This pleasant 78 year old woman is seen for a scheduled followup office visit. She has a past history of essential hypertension and a history of mild aortic valve disease with aortic stenosis and aortic insufficiency.  Her last echocardiogram in March 2010 showed moderate aortic insufficiency with mild mitral regurgitation and normal LV function. She does not have any history of ischemic heart disease. She has had a previous right total knee replacement by Dr. Priscille Kluver.  She is still having problems with her knees but does not anticipate any further surgery. In November she was admitted to [facility] with small bowel obstruction and required surgery. She has had a problem with postoperative wound infection the wound is closing by secondary intention.  It is almost healed and it is no longer draining.   Current Outpatient Prescriptions  Medication Sig Dispense Refill  . aspirin 81 MG tablet Take 81 mg by mouth daily.        Marland Kitchen atenolol (TENORMIN) 50 MG tablet TAKE 1 TABLET BY MOUTH TWICE A DAY  180 tablet  3  . BIOTIN PO Take 5,000 mg by mouth daily.       . Calcium Carbonate-Vitamin D 600-400 MG-UNIT per tablet Take 1 tablet by mouth daily.      . Diphenhydramine-APAP, sleep, (TYLENOL PM EXTRA STRENGTH PO) Take 1 tablet by mouth at bedtime. For sleep      . losartan-hydrochlorothiazide (HYZAAR) 100-12.5 MG per tablet Take 1 tablet by mouth daily.  90 tablet  3  . Multiple Vitamins-Iron (MULTIVITAMINS WITH IRON) TABS Take 1 tablet by mouth daily with supper.  1 tablet    . nitroGLYCERIN (NITROSTAT) 0.4 MG SL tablet Place 0.4 mg under the tongue every 5 (five) minutes as needed. For chest pain.        Allergies  Allergen Reactions  . Morphine And Related Nausea And Vomiting  . Lisinopril Cough     Patient Active Problem List  Diagnosis  . History of valvular heart disease  . Benign hypertensive heart disease without heart failure  . Osteoarthritis  . History of colon cancer  . Rectal cancer  . Acute renal failure  . Enteritis  . Hypotension  . SBO (small bowel obstruction)  . Anemia due to blood loss, acute  . C. difficile colitis  . Open abdominal wall wound  . Cancer  . Medicare annual wellness visit, initial    History  Smoking status  . Never Smoker   Smokeless tobacco  . Never Used    History  Alcohol Use No    Family History  Problem Relation Age of Onset  . Heart disease Mother     CHF, valve problems  . Heart disease Father     due to lupus  . Lupus Father   . Cancer Paternal Grandmother     colon  . Coronary artery disease Brother     s/p CABG  . Coronary artery disease Sister     s/p CABG  . Diabetes Neg Hx     Review of Systems: Constitutional: no fever chills diaphoresis or fatigue or change in weight.  Head and neck: no hearing loss, no epistaxis, no photophobia or visual disturbance. Respiratory: No cough, shortness of breath or wheezing. Cardiovascular: No chest pain peripheral edema, palpitations. Gastrointestinal:  No abdominal distention, no abdominal pain, no change in bowel habits hematochezia or melena. Genitourinary: No dysuria, no frequency, no urgency, no nocturia. Musculoskeletal:No arthralgias, no back pain, no gait disturbance or myalgias. Neurological: No dizziness, no headaches, no numbness, no seizures, no syncope, no weakness, no tremors. Hematologic: No lymphadenopathy, no easy bruising. Psychiatric: No confusion, no hallucinations, no sleep disturbance.    Physical Exam: Filed Vitals:   02/09/12 0952  BP: 132/68  Pulse: 61   the general appearance reveals a well-developed well-nourished woman in no distress.  Her weight is up 5 pounds and she is now at her base preoperative weight.The head and neck exam  reveals pupils equal and reactive.  Extraocular movements are full.  There is no scleral icterus.  The mouth and pharynx are normal.  The neck is supple.  The carotids reveal no bruits.  The jugular venous pressure is normal.  The  thyroid is not enlarged.  There is no lymphadenopathy.  The chest is clear to percussion and auscultation.  There are no rales or rhonchi.  Expansion of the chest is symmetrical.  The precordium is quiet.  The first heart sound is normal.  The second heart sound is physiologically split.  There is very soft systolic murmur at the base.  No diastolic murmur heard today.  No gallop or rub. There is no abnormal lift or heave.  The abdomen is soft and nontender.  The bowel sounds are normal.  The liver and spleen are not enlarged.  There are no abdominal masses.  There are no abdominal bruits.  Extremities reveal good pedal pulses.  There is no phlebitis or edema.  There is no cyanosis or clubbing.  Strength is normal and symmetrical in all extremities.  There is no lateralizing weakness.  There are no sensory deficits.  The skin is warm and dry.  There is no rash.     Assessment / Plan: Continue same medication.  Recheck in 6 months for followup office visit and EKG.

## 2012-02-09 NOTE — Assessment & Plan Note (Signed)
Patient has a history of moderate aortic insufficiency.  She is not experiencing any orthopnea or paroxysmal nocturnal dyspnea.  Last chest x-ray done a year ago on 02/05/11 showed mild cardiomegaly

## 2012-02-09 NOTE — Assessment & Plan Note (Signed)
The patient has had prior right knee replacement.  He is still having discomfort in both knees.  She saw Dr. Madelon Lips who performed x-rays.  She does not wish to proceed with any further surgery on her knees at this time

## 2012-02-23 ENCOUNTER — Encounter (INDEPENDENT_AMBULATORY_CARE_PROVIDER_SITE_OTHER): Payer: Self-pay | Admitting: Surgery

## 2012-02-23 ENCOUNTER — Ambulatory Visit (INDEPENDENT_AMBULATORY_CARE_PROVIDER_SITE_OTHER): Payer: Medicare Other | Admitting: Surgery

## 2012-02-23 VITALS — BP 154/60 | HR 66 | Temp 97.8°F | Ht 61.0 in | Wt 153.8 lb

## 2012-02-23 DIAGNOSIS — T8189XA Other complications of procedures, not elsewhere classified, initial encounter: Secondary | ICD-10-CM | POA: Diagnosis not present

## 2012-02-23 NOTE — Progress Notes (Signed)
Subjective:     Patient ID: Priscilla [name], female   DOB: 1930/08/16, 78 y.o.   MRN: [id number]  HPI She is back today for another wound check. She still has intermittent drainage from the wound at the umbilicus  Review of Systems     Objective:   Physical Exam On exam, there is only a 2-3 mm open area which I treated with silver nitrate    Assessment:     Nonhealing surgical wound    Plan:     We will continue the current wound care and I will see her back in one month. I again offered revision of the wound but she declined

## 2012-03-11 ENCOUNTER — Ambulatory Visit (INDEPENDENT_AMBULATORY_CARE_PROVIDER_SITE_OTHER): Payer: Medicare Other | Admitting: Family Medicine

## 2012-03-11 ENCOUNTER — Encounter: Payer: Self-pay | Admitting: Family Medicine

## 2012-03-11 VITALS — BP 130/74 | HR 64 | Temp 98.1°F | Wt 153.2 lb

## 2012-03-11 DIAGNOSIS — I119 Hypertensive heart disease without heart failure: Secondary | ICD-10-CM | POA: Diagnosis not present

## 2012-03-11 NOTE — Patient Instructions (Signed)
Good to see you today!  Call us with questions. Merry Christmas. Return in 6 months for medicare wellness visit. No changes to meds today.

## 2012-03-11 NOTE — Progress Notes (Signed)
  Subjective:    Patient ID: Priscilla [name], female    DOB: [birth year], 78 y.o.   MRN: [id number]  HPI CC: f/u HTN  HTN - last visit increased hyzaar to 100/12.5.  Also on atenolol 50mg  bid.  At home bp running 121-140/50s.  No HA, vision changes, CP/tightness, SOB, leg swelling.  No dizziness.  abd wound - sees Dr. Rayburn Ma regularly.  Considering rpt skin grafting.  Having knee pain and pain between L ankle and knee.  Sees ortho for this.  Has had R knee replaced.  Sees Dr. Madelon Lips ortho.  Past Medical History  Diagnosis Date  . Hypertension   . Chest pain, atypical   . IDA (iron deficiency anemia)   . Heart murmur   . History of DVT (deep vein thrombosis) 2002    right leg, after chemo for first cancer, treated  . Osteoarthritis   . Cancer 2001, 2008    colon, rectal  . Small bowel obstruction 2012    adhesion related  . History of chicken pox   . Osteopenia 09/2011    DEXA spine -2.2, femur -1.6    Past Surgical History  Procedure Date  . Nuclear stress test 11/17/08    NORMAL  . Replacement total knee 2010    RIGHT KNEE  . Colonoscopy 02/2010  . Ileostomy Q  . Colon surgery 2001 and 2008  . Appendectomy 1984  . Colectomy   . Colostomy   . Abdominal hysterectomy 1984  . Joint replacement 2010    knee   . Laparotomy 02/05/2011    Procedure: EXPLORATORY LAPAROTOMY;  Surgeon: Shelly Rubenstein, MD;  Location: [facility] OR;  Service: General;  Laterality: N/A;  lysis of adhesions.  . Bowel resection 02/05/2011    Procedure: SMALL BOWEL RESECTION;  Surgeon: Shelly Rubenstein, MD;  Location: [facility] OR;  Service: General;  Laterality: N/A;  . Esophagogastroduodenoscopy 01/31/2011    Procedure: ESOPHAGOGASTRODUODENOSCOPY (EGD);  Surgeon: Barrie Folk, MD;  Location: [facility] ENDOSCOPY;  Service: Endoscopy;  Laterality: N/A;  duodenal bx;s r/o giardia, r/o celiac diease  . Flexible sigmoidoscopy 01/31/2011    Procedure: FLEXIBLE SIGMOIDOSCOPY;  Surgeon: Barrie Folk, MD;  Location: [facility]  ENDOSCOPY;  Service: Endoscopy;  Laterality: N/A;  . Dexa 09/2011    T-score: spine -2.2, femur -1.6    Review of Systems Per HPI    Objective:   Physical Exam  Nursing note and vitals reviewed. Constitutional: She appears well-developed and well-nourished. No distress.  HENT:  Head: Normocephalic and atraumatic.  Mouth/Throat: Oropharynx is clear and moist. No oropharyngeal exudate.  Eyes: Conjunctivae normal and EOM are normal. Pupils are equal, round, and reactive to light. No scleral icterus.  Neck: Normal range of motion. Neck supple. Carotid bruit is not present.  Cardiovascular: Normal rate, regular rhythm, normal heart sounds and intact distal pulses.   No murmur heard. Pulmonary/Chest: Effort normal and breath sounds normal. No respiratory distress. She has no wheezes. She has no rales.  Musculoskeletal: She exhibits no edema.  Lymphadenopathy:    She has no cervical adenopathy.  Skin: Skin is warm and dry. No rash noted.  Psychiatric: She has a normal mood and affect.       Assessment & Plan:

## 2012-03-11 NOTE — Assessment & Plan Note (Signed)
Chronic, stable. Good control on increased hyzaar. No changes today. RTC 6 mo for medicare wellness visit.

## 2012-03-29 ENCOUNTER — Encounter (INDEPENDENT_AMBULATORY_CARE_PROVIDER_SITE_OTHER): Payer: Medicare Other | Admitting: Surgery

## 2012-04-07 ENCOUNTER — Encounter (INDEPENDENT_AMBULATORY_CARE_PROVIDER_SITE_OTHER): Payer: Self-pay | Admitting: Surgery

## 2012-04-07 ENCOUNTER — Ambulatory Visit (INDEPENDENT_AMBULATORY_CARE_PROVIDER_SITE_OTHER): Payer: Medicare Other | Admitting: Surgery

## 2012-04-07 VITALS — BP 148/89 | HR 80 | Temp 97.2°F | Resp 16 | Ht 61.0 in | Wt 157.4 lb

## 2012-04-07 DIAGNOSIS — T8189XA Other complications of procedures, not elsewhere classified, initial encounter: Secondary | ICD-10-CM | POA: Diagnosis not present

## 2012-04-07 NOTE — Progress Notes (Signed)
Subjective:     Patient ID: Priscilla [name], female   DOB: January 03, 1931, 79 y.o.   MRN: [id number]  HPI She is here for another visit. She reports only occasional drainage from the wound.  Review of Systems     Objective:   Physical Exam On exam, the wound is 95% closed. I probed a small open area to make sure there was not a residual suture underneath and could find none. I then treated with silver nitrate    Assessment:     Nonhealing surgical wound    Plan:     I will see her back in one month. If this persists, we will have to consider excision of this area

## 2012-04-19 DIAGNOSIS — M25569 Pain in unspecified knee: Secondary | ICD-10-CM | POA: Diagnosis not present

## 2012-04-19 DIAGNOSIS — M25579 Pain in unspecified ankle and joints of unspecified foot: Secondary | ICD-10-CM | POA: Diagnosis not present

## 2012-05-04 ENCOUNTER — Encounter (INDEPENDENT_AMBULATORY_CARE_PROVIDER_SITE_OTHER): Payer: Medicare Other | Admitting: Surgery

## 2012-05-06 ENCOUNTER — Encounter (INDEPENDENT_AMBULATORY_CARE_PROVIDER_SITE_OTHER): Payer: Medicare Other | Admitting: Surgery

## 2012-05-17 ENCOUNTER — Ambulatory Visit (INDEPENDENT_AMBULATORY_CARE_PROVIDER_SITE_OTHER): Payer: Medicare Other | Admitting: Surgery

## 2012-05-17 ENCOUNTER — Encounter (HOSPITAL_COMMUNITY): Payer: Self-pay | Admitting: Pharmacy Technician

## 2012-05-17 ENCOUNTER — Encounter (INDEPENDENT_AMBULATORY_CARE_PROVIDER_SITE_OTHER): Payer: Self-pay | Admitting: Surgery

## 2012-05-17 VITALS — BP 124/72 | HR 70 | Temp 97.7°F | Resp 12 | Ht 61.0 in | Wt 155.8 lb

## 2012-05-17 DIAGNOSIS — Z5189 Encounter for other specified aftercare: Secondary | ICD-10-CM

## 2012-05-17 DIAGNOSIS — T8189XD Other complications of procedures, not elsewhere classified, subsequent encounter: Secondary | ICD-10-CM

## 2012-05-17 NOTE — Progress Notes (Signed)
Subjective:     Patient ID: Priscilla [name], female   DOB: Aug 18, 1930, 79 y.o.   MRN: [id number]  HPI She is here for another evaluation of her nonhealing midline surgical wound. She reports that she is still having drainage from the wound. There also be occasional bleeding as well as pain. We have treated the wound with a wound VAC, wet to dry dressing changes, and hydrogel now for over one year and have not been able to heal the wound.  Review of Systems     Objective:   Physical Exam On exam, there is still a moderate size midline scar with granulation tissue that is still draining. There is no current infection. There are no underlying sutures. There is no drainage to suggest a fistula    Assessment:     Nonhealing surgical wound.     Plan:     As this has failed all conservative measures, surgical excision of the wound in the operating room is recommended to finally achieve closure. She agrees to proceed. Risks were discussed. Surgery will be scheduled

## 2012-05-20 ENCOUNTER — Encounter (HOSPITAL_COMMUNITY)
Admission: RE | Admit: 2012-05-20 | Discharge: 2012-05-20 | Disposition: A | Discharge status: Home / Self Care | Payer: Medicare Other | Source: Ambulatory Visit | Attending: Surgery | Admitting: Surgery

## 2012-05-20 ENCOUNTER — Encounter (HOSPITAL_COMMUNITY): Payer: Self-pay

## 2012-05-20 DIAGNOSIS — Z01810 Encounter for preprocedural cardiovascular examination: Secondary | ICD-10-CM | POA: Diagnosis not present

## 2012-05-20 DIAGNOSIS — T8189XA Other complications of procedures, not elsewhere classified, initial encounter: Secondary | ICD-10-CM | POA: Diagnosis not present

## 2012-05-20 DIAGNOSIS — Z01812 Encounter for preprocedural laboratory examination: Secondary | ICD-10-CM | POA: Diagnosis not present

## 2012-05-20 DIAGNOSIS — I10 Essential (primary) hypertension: Secondary | ICD-10-CM | POA: Diagnosis not present

## 2012-05-20 DIAGNOSIS — Z86718 Personal history of other venous thrombosis and embolism: Secondary | ICD-10-CM | POA: Diagnosis not present

## 2012-05-20 DIAGNOSIS — Z01818 Encounter for other preprocedural examination: Secondary | ICD-10-CM | POA: Diagnosis not present

## 2012-05-20 HISTORY — DX: Unspecified open wound of abdominal wall, unspecified quadrant without penetration into peritoneal cavity, initial encounter: S31.109A

## 2012-05-20 LAB — BASIC METABOLIC PANEL WITH GFR
BUN: 21 mg/dL (ref 6–23)
CO2: 27 meq/L (ref 19–32)
Calcium: 9.9 mg/dL (ref 8.4–10.5)
Chloride: 100 meq/L (ref 96–112)
Creatinine, Ser: 0.8 mg/dL (ref 0.50–1.10)
GFR calc Af Amer: 79 mL/min — ABNORMAL LOW
GFR calc non Af Amer: 68 mL/min — ABNORMAL LOW
Glucose, Bld: 101 mg/dL — ABNORMAL HIGH (ref 70–99)
Potassium: 4.5 meq/L (ref 3.5–5.1)
Sodium: 137 meq/L (ref 135–145)

## 2012-05-20 LAB — CBC
MCH: 30.8 pg (ref 26.0–34.0)
MCHC: 34.8 g/dL (ref 30.0–36.0)
Platelets: 298 10*3/uL (ref 150–400)
RDW: 12.4 % (ref 11.5–15.5)

## 2012-05-20 LAB — SURGICAL PCR SCREEN: MRSA, PCR: NEGATIVE

## 2012-05-20 MED ORDER — CEFAZOLIN SODIUM-DEXTROSE 2-3 GM-% IV SOLR
2.0000 g | INTRAVENOUS | Status: AC
  Administered 2012-05-21: 2 g via INTRAVENOUS
  Filled 2012-05-20: qty 50

## 2012-05-20 NOTE — Progress Notes (Signed)
Dr Patty Sermons called for any cardiac studies

## 2012-05-20 NOTE — H&P (Signed)
Priscilla [name] is an 79 y.o. female.   Chief Complaint: non healing abdominal wound HPI: this is a pleasant female over 1 year s/p exploratory lap for small bowel obstruction.  Despite aggressive wound care including wound VAC, wet to dry wound care, hydrogel, etc, she has failed to heal the midline wound and has had persistent drainage from the wound as well as pain.  Past Medical History  Diagnosis Date  . Chest pain, atypical   . IDA (iron deficiency anemia)   . Heart murmur   . History of DVT (deep vein thrombosis) 2002    right leg, after chemo for first cancer, treated  . Osteoarthritis   . Cancer 2001, 2008    colon, rectal  . Small bowel obstruction 2012    adhesion related  . History of chicken pox   . Osteopenia 09/2011    DEXA spine -2.2, femur -1.6  . Hypertension     dr Patty Sermons  . Open wound of abdominal wall with complication     Past Surgical History  Procedure Laterality Date  . Nuclear stress test  11/17/08    NORMAL  . Replacement total knee  2010    RIGHT KNEE  . Colonoscopy  02/2010  . Ileostomy  Q  . Colon surgery  2001 and 2008  . Appendectomy  1984  . Colectomy    . Colostomy    . Abdominal hysterectomy  1984  . Joint replacement  2010    knee   . Laparotomy  02/05/2011    Procedure: EXPLORATORY LAPAROTOMY;  Surgeon: Shelly Rubenstein, MD;  Location: [facility] OR;  Service: General;  Laterality: N/A;  lysis of adhesions.  . Bowel resection  02/05/2011    Procedure: SMALL BOWEL RESECTION;  Surgeon: Shelly Rubenstein, MD;  Location: [facility] OR;  Service: General;  Laterality: N/A;  . Esophagogastroduodenoscopy  01/31/2011    Procedure: ESOPHAGOGASTRODUODENOSCOPY (EGD);  Surgeon: Barrie Folk, MD;  Location: [facility] ENDOSCOPY;  Service: Endoscopy;  Laterality: N/A;  duodenal bx;s r/o giardia, r/o celiac diease  . Flexible sigmoidoscopy  01/31/2011    Procedure: FLEXIBLE SIGMOIDOSCOPY;  Surgeon: Barrie Folk, MD;  Location: [facility] ENDOSCOPY;  Service: Endoscopy;   Laterality: N/A;  . Dexa  09/2011    T-score: spine -2.2, femur -1.6  . Broken nose      Family History  Problem Relation Age of Onset  . Heart disease Mother     CHF, valve problems  . Heart disease Father     due to lupus  . Lupus Father   . Cancer Paternal Grandmother     colon  . Coronary artery disease Brother     s/p CABG  . Coronary artery disease Sister     s/p CABG  . Diabetes Neg Hx    Social History:  reports that she has never smoked. She has never used smokeless tobacco. She reports that she does not drink alcohol or use illicit drugs.  Allergies:  Allergies  Allergen Reactions  . Morphine And Related Nausea And Vomiting  . Lisinopril Cough    No prescriptions prior to admission    Results for orders placed during the hospital encounter of 05/20/12 (from the past 48 hour(s))  SURGICAL PCR SCREEN     Status: None   Collection Time    05/20/12 10:01 AM      Result Value Range   MRSA, PCR NEGATIVE  NEGATIVE   Staphylococcus aureus NEGATIVE  NEGATIVE   Comment:  The Xpert SA Assay (FDA     approved for NASAL specimens     in patients over 62 years of age),     is one component of     a comprehensive surveillance     program.  Test performance has     been validated by [employer] for patients greater     than or equal to 7 year old.     It is not intended     to diagnose infection nor to     guide or monitor treatment.  BASIC METABOLIC PANEL     Status: Abnormal   Collection Time    05/20/12 10:01 AM      Result Value Range   Sodium 137  135 - 145 mEq/L   Potassium 4.5  3.5 - 5.1 mEq/L   Chloride 100  96 - 112 mEq/L   CO2 27  19 - 32 mEq/L   Glucose, Bld 101 (*) 70 - 99 mg/dL   BUN 21  6 - 23 mg/dL   Creatinine, Ser 1.61  0.50 - 1.10 mg/dL   Calcium 9.9  8.4 - 09.6 mg/dL   GFR calc non Af Amer 68 (*) >90 mL/min   GFR calc Af Amer 79 (*) >90 mL/min   Comment:            The eGFR has been calculated     using the CKD EPI  equation.     This calculation has not been     validated in all clinical     situations.     eGFR's persistently     <90 mL/min signify     possible Chronic Kidney Disease.  CBC     Status: Abnormal   Collection Time    05/20/12 10:01 AM      Result Value Range   WBC 6.3  4.0 - 10.5 K/uL   RBC 3.93  3.87 - 5.11 MIL/uL   Hemoglobin 12.1  12.0 - 15.0 g/dL   HCT 04.5 (*) 40.9 - 81.1 %   MCV 88.5  78.0 - 100.0 fL   MCH 30.8  26.0 - 34.0 pg   MCHC 34.8  30.0 - 36.0 g/dL   RDW 91.4  78.2 - 95.6 %   Platelets 298  150 - 400 K/uL   Dg Chest 2 View  05/20/2012  *RADIOLOGY REPORT*  Clinical Data: Hypertension  CHEST - 2 VIEW  Comparison: 02/05/2011  Findings: Cardiomediastinal silhouette is stable.  No acute infiltrate or pleural effusion.  No pulmonary edema.  Bony thorax is stable.  IMPRESSION: No active disease.   Original Report Authenticated By: Natasha Mead, M.D.     Review of Systems  All other systems reviewed and are negative.    There were no vitals taken for this visit. Physical Exam  Constitutional: She is oriented to person, place, and time. She appears well-developed and well-nourished. No distress.  HENT:  Head: Normocephalic and atraumatic.  Cardiovascular: Normal rate, regular rhythm and normal heart sounds.   No murmur heard. Respiratory: Effort normal and breath sounds normal. No respiratory distress.  GI: Soft. She exhibits no distension. There is tenderness. There is guarding.  Open midline incision with deep recess and undermining of the lateral edge as well as exposed granulation tissue  Musculoskeletal: Normal range of motion.  Neurological: She is alert and oriented to person, place, and time.  Skin: Skin is warm. She is not diaphoretic.  Psychiatric: Her  behavior is normal. Judgment normal.     Assessment/Plan Chronic non healing surgical wound  Will proceed with wound excision and primary closure.  This will excise the chronic draining granulation  tissue and explore for possible foreign body/stitch.  The risks of developing another open wound, bleeding, infection, injury were discussed.  Drequan Ironside A 05/20/2012, 10:09 PM

## 2012-05-20 NOTE — Pre-Procedure Instructions (Signed)
Priscilla [name]  05/20/2012   Your procedure is scheduled on:  05/21/12  Report to [facility] at 650 AM.  Call this number if you have problems the morning of surgery: [phone]   Remember:   Do not eat food or drink liquids after midnight.   Take these medicines the morning of surgery with A SIP OF WATER: atenolol,nyg if needed   Do not wear jewelry, make-up or nail polish.  Do not wear lotions, powders, or perfumes. You may wear deodorant.  Do not shave 48 hours prior to surgery. Men may shave face and neck.  Do not bring valuables to the hospital.  Contacts, dentures or bridgework may not be worn into surgery.  Leave suitcase in the car. After surgery it may be brought to your room.  For patients admitted to the hospital, checkout time is 11:00 AM the day of  discharge.   Patients discharged the day of surgery will not be allowed to drive  home.  Name and phone number of your driver: family  Special Instructions: Shower using CHG 2 nights before surgery and the night before surgery.  If you shower the day of surgery use CHG.  Use special wash - you have one bottle of CHG for all showers.  You should use approximately 1/3 of the bottle for each shower.   Please read over the following fact sheets that you were given: Pain Booklet, Coughing and Deep Breathing, MRSA Information and Surgical Site Infection Prevention

## 2012-05-21 ENCOUNTER — Encounter: Payer: Self-pay | Admitting: Cardiology

## 2012-05-21 ENCOUNTER — Encounter (HOSPITAL_COMMUNITY): Payer: Self-pay | Admitting: *Deleted

## 2012-05-21 ENCOUNTER — Ambulatory Visit (HOSPITAL_COMMUNITY)
Admission: RE | Admit: 2012-05-21 | Discharge: 2012-05-21 | Disposition: A | Discharge status: Home / Self Care | Payer: Medicare Other | Source: Ambulatory Visit | Attending: Surgery | Admitting: Surgery

## 2012-05-21 ENCOUNTER — Ambulatory Visit (HOSPITAL_COMMUNITY): Payer: Medicare Other | Admitting: Certified Registered Nurse Anesthetist

## 2012-05-21 ENCOUNTER — Encounter (HOSPITAL_COMMUNITY): Payer: Self-pay | Admitting: Certified Registered Nurse Anesthetist

## 2012-05-21 ENCOUNTER — Encounter (HOSPITAL_COMMUNITY)
Admission: RE | Disposition: A | Discharge status: Home / Self Care | Payer: Self-pay | Source: Ambulatory Visit | Attending: Surgery

## 2012-05-21 DIAGNOSIS — Z86718 Personal history of other venous thrombosis and embolism: Secondary | ICD-10-CM | POA: Diagnosis not present

## 2012-05-21 DIAGNOSIS — Z01818 Encounter for other preprocedural examination: Secondary | ICD-10-CM | POA: Insufficient documentation

## 2012-05-21 DIAGNOSIS — Y849 Medical procedure, unspecified as the cause of abnormal reaction of the patient, or of later complication, without mention of misadventure at the time of the procedure: Secondary | ICD-10-CM | POA: Insufficient documentation

## 2012-05-21 DIAGNOSIS — Z01810 Encounter for preprocedural cardiovascular examination: Secondary | ICD-10-CM | POA: Diagnosis not present

## 2012-05-21 DIAGNOSIS — I10 Essential (primary) hypertension: Secondary | ICD-10-CM | POA: Diagnosis not present

## 2012-05-21 DIAGNOSIS — Z01812 Encounter for preprocedural laboratory examination: Secondary | ICD-10-CM | POA: Insufficient documentation

## 2012-05-21 DIAGNOSIS — T8189XA Other complications of procedures, not elsewhere classified, initial encounter: Secondary | ICD-10-CM | POA: Diagnosis not present

## 2012-05-21 DIAGNOSIS — S31109A Unspecified open wound of abdominal wall, unspecified quadrant without penetration into peritoneal cavity, initial encounter: Secondary | ICD-10-CM | POA: Diagnosis not present

## 2012-05-21 HISTORY — PX: WOUND DEBRIDEMENT: SHX247

## 2012-05-21 SURGERY — IRRIGATION AND DEBRIDEMENT, WOUND, ABDOMEN, WITH CLOSURE
Anesthesia: General | Site: Abdomen | Wound class: Clean Contaminated

## 2012-05-21 MED ORDER — MIDAZOLAM HCL 5 MG/5ML IJ SOLN
INTRAMUSCULAR | Status: DC | PRN
  Administered 2012-05-21: 2 mg via INTRAVENOUS

## 2012-05-21 MED ORDER — HYDROCODONE-ACETAMINOPHEN 5-325 MG PO TABS
2.0000 | ORAL_TABLET | Freq: Once | ORAL | Status: AC
  Administered 2012-05-21: 2 via ORAL

## 2012-05-21 MED ORDER — FENTANYL CITRATE 0.05 MG/ML IJ SOLN
25.0000 ug | INTRAMUSCULAR | Status: DC | PRN
  Administered 2012-05-21: 50 ug via INTRAVENOUS
  Administered 2012-05-21: 25 ug via INTRAVENOUS

## 2012-05-21 MED ORDER — HYDROCODONE-ACETAMINOPHEN 5-325 MG PO TABS
ORAL_TABLET | ORAL | Status: AC
  Filled 2012-05-21: qty 2

## 2012-05-21 MED ORDER — ONDANSETRON HCL 4 MG/2ML IJ SOLN
INTRAMUSCULAR | Status: DC | PRN
  Administered 2012-05-21: 4 mg via INTRAVENOUS

## 2012-05-21 MED ORDER — LACTATED RINGERS IV SOLN
INTRAVENOUS | Status: DC | PRN
  Administered 2012-05-21: 08:00:00 via INTRAVENOUS

## 2012-05-21 MED ORDER — BUPIVACAINE-EPINEPHRINE 0.25% -1:200000 IJ SOLN
INTRAMUSCULAR | Status: DC | PRN
  Administered 2012-05-21: 20 mL

## 2012-05-21 MED ORDER — HYDROCODONE-ACETAMINOPHEN 5-325 MG PO TABS: 1.0000 | ORAL_TABLET | Freq: Four times a day (QID) | ORAL | Status: DC | PRN

## 2012-05-21 MED ORDER — FENTANYL CITRATE 0.05 MG/ML IJ SOLN
INTRAMUSCULAR | Status: AC
  Filled 2012-05-21: qty 2

## 2012-05-21 MED ORDER — METOCLOPRAMIDE HCL 5 MG/ML IJ SOLN: 10.0000 mg | Freq: Once | INTRAMUSCULAR | Status: DC | PRN

## 2012-05-21 MED ORDER — FENTANYL CITRATE 0.05 MG/ML IJ SOLN
INTRAMUSCULAR | Status: DC | PRN
  Administered 2012-05-21 (×2): 25 ug via INTRAVENOUS

## 2012-05-21 MED ORDER — 0.9 % SODIUM CHLORIDE (POUR BTL) OPTIME
TOPICAL | Status: DC | PRN
  Administered 2012-05-21: 1000 mL

## 2012-05-21 MED ORDER — EPHEDRINE SULFATE 50 MG/ML IJ SOLN
INTRAMUSCULAR | Status: DC | PRN
  Administered 2012-05-21 (×2): 5 mg via INTRAVENOUS

## 2012-05-21 MED ORDER — PROPOFOL 10 MG/ML IV BOLUS
INTRAVENOUS | Status: DC | PRN
  Administered 2012-05-21: 150 mg via INTRAVENOUS

## 2012-05-21 SURGICAL SUPPLY — 43 items
BAG DECANTER FOR FLEXI CONT (MISCELLANEOUS) IMPLANT
BENZOIN TINCTURE PRP APPL 2/3 (GAUZE/BANDAGES/DRESSINGS) ×2 IMPLANT
BLADE SURG ROTATE 9660 (MISCELLANEOUS) IMPLANT
CANISTER SUCTION 2500CC (MISCELLANEOUS) ×2 IMPLANT
CLOTH BEACON ORANGE TIMEOUT ST (SAFETY) ×2 IMPLANT
COVER SURGICAL LIGHT HANDLE (MISCELLANEOUS) ×2 IMPLANT
DRAPE INCISE IOBAN 66X45 STRL (DRAPES) IMPLANT
DRAPE LAPAROSCOPIC ABDOMINAL (DRAPES) ×2 IMPLANT
DRAPE UTILITY 15X26 W/TAPE STR (DRAPE) ×4 IMPLANT
ELECT REM PT RETURN 9FT ADLT (ELECTROSURGICAL) ×2
ELECTRODE REM PT RTRN 9FT ADLT (ELECTROSURGICAL) ×1 IMPLANT
GLOVE BIO SURGEON STRL SZ7.5 (GLOVE) ×2 IMPLANT
GLOVE BIO SURGEON STRL SZ8 (GLOVE) IMPLANT
GLOVE BIOGEL PI IND STRL 6.5 (GLOVE) ×1 IMPLANT
GLOVE BIOGEL PI IND STRL 7.0 (GLOVE) ×1 IMPLANT
GLOVE BIOGEL PI IND STRL 7.5 (GLOVE) ×1 IMPLANT
GLOVE BIOGEL PI IND STRL 8 (GLOVE) IMPLANT
GLOVE BIOGEL PI INDICATOR 6.5 (GLOVE) ×1
GLOVE BIOGEL PI INDICATOR 7.0 (GLOVE) ×1
GLOVE BIOGEL PI INDICATOR 7.5 (GLOVE) ×1
GLOVE BIOGEL PI INDICATOR 8 (GLOVE)
GLOVE SURG SIGNA 7.5 PF LTX (GLOVE) ×2 IMPLANT
GLOVE SURG SS PI 6.5 STRL IVOR (GLOVE) ×2 IMPLANT
GOWN PREVENTION PLUS XLARGE (GOWN DISPOSABLE) ×2 IMPLANT
GOWN STRL NON-REIN LRG LVL3 (GOWN DISPOSABLE) ×4 IMPLANT
KIT BASIN OR (CUSTOM PROCEDURE TRAY) ×2 IMPLANT
KIT ROOM TURNOVER OR (KITS) ×2 IMPLANT
NEEDLE HYPO 25GX1X1/2 BEV (NEEDLE) ×2 IMPLANT
NS IRRIG 1000ML POUR BTL (IV SOLUTION) ×2 IMPLANT
PACK GENERAL/GYN (CUSTOM PROCEDURE TRAY) ×2 IMPLANT
PAD ARMBOARD 7.5X6 YLW CONV (MISCELLANEOUS) ×4 IMPLANT
SPONGE GAUZE 4X4 12PLY (GAUZE/BANDAGES/DRESSINGS) IMPLANT
SPONGE LAP 18X18 X RAY DECT (DISPOSABLE) IMPLANT
STAPLER VISISTAT 35W (STAPLE) IMPLANT
STRIP CLOSURE SKIN 1/2X4 (GAUZE/BANDAGES/DRESSINGS) ×2 IMPLANT
SUT MNCRL AB 4-0 PS2 18 (SUTURE) ×2 IMPLANT
SUT NOVA 1 T20/GS 25DT (SUTURE) ×4 IMPLANT
SYR CONTROL 10ML LL (SYRINGE) ×2 IMPLANT
TOWEL OR 17X24 6PK STRL BLUE (TOWEL DISPOSABLE) ×2 IMPLANT
TOWEL OR 17X26 10 PK STRL BLUE (TOWEL DISPOSABLE) ×2 IMPLANT
UNDERPAD 30X30 INCONTINENT (UNDERPADS AND DIAPERS) IMPLANT
WATER STERILE IRR 1000ML POUR (IV SOLUTION) IMPLANT
YANKAUER SUCT BULB TIP NO VENT (SUCTIONS) ×2 IMPLANT

## 2012-05-21 NOTE — Anesthesia Procedure Notes (Signed)
Procedure Name: LMA Insertion Date/Time: 05/21/2012 8:38 AM Performed by: Margaree Mackintosh Pre-anesthesia Checklist: Patient identified, Timeout performed, Emergency Drugs available, Suction available and Patient being monitored Patient Re-evaluated:Patient Re-evaluated prior to inductionOxygen Delivery Method: Circle system utilized Preoxygenation: Pre-oxygenation with 100% oxygen Intubation Type: IV induction LMA: LMA inserted LMA Size: 4.0 Number of attempts: 1 Placement Confirmation: positive ETCO2 and breath sounds checked- equal and bilateral Tube secured with: Tape Dental Injury: Teeth and Oropharynx as per pre-operative assessment

## 2012-05-21 NOTE — Anesthesia Preprocedure Evaluation (Addendum)
Anesthesia Evaluation  Patient identified by MRN, date of birth, ID band Patient awake    Reviewed: Allergy & Precautions, H&P , NPO status , Patient's Chart, lab work & pertinent test results, reviewed documented beta blocker date and time   History of Anesthesia Complications Negative for: history of anesthetic complications  Airway Mallampati: I TM Distance: >3 FB Neck ROM: Full    Dental  (+) Teeth Intact and Dental Advisory Given   Pulmonary neg pulmonary ROS,  breath sounds clear to auscultation  Pulmonary exam normal       Cardiovascular hypertension, Pt. on home beta blockers and Pt. on medications + Valvular Problems/Murmurs MVP Rhythm:regular     Neuro/Psych negative neurological ROS  negative psych ROS   GI/Hepatic negative GI ROS, Neg liver ROS,   Endo/Other  negative endocrine ROS  Renal/GU Renal disease  negative genitourinary   Musculoskeletal   Abdominal   Peds  Hematology negative hematology ROS (+) anemia ,   Anesthesia Other Findings See surgeon's H&P   Reproductive/Obstetrics negative OB ROS                          Anesthesia Physical Anesthesia Plan  ASA: II  Anesthesia Plan: General   Post-op Pain Management:    Induction: Intravenous  Airway Management Planned: LMA  Additional Equipment:   Intra-op Plan:   Post-operative Plan: Extubation in OR  Informed Consent: I have reviewed the patients History and Physical, chart, labs and discussed the procedure including the risks, benefits and alternatives for the proposed anesthesia with the patient or authorized representative who has indicated his/her understanding and acceptance.   Dental Advisory Given  Plan Discussed with: CRNA and Surgeon  Anesthesia Plan Comments:         Anesthesia Quick Evaluation

## 2012-05-21 NOTE — Preoperative (Signed)
Beta Blockers   Reason not to administer Beta Blockers:Not Applicable, took atenolol this am 

## 2012-05-21 NOTE — Transfer of Care (Signed)
Immediate Anesthesia Transfer of Care Note  Patient: Priscilla [name]  Procedure(s) Performed: Procedure(s): Excision non healing ABDOMINAL WOUND (N/A)  Patient Location: PACU  Anesthesia Type:General  Level of Consciousness: awake, alert  and oriented  Airway & Oxygen Therapy: Patient Spontanous Breathing and Patient connected to nasal cannula oxygen  Post-op Assessment: Report given to PACU RN and Post -op Vital signs reviewed and stable  Post vital signs: Reviewed and stable  Complications: No apparent anesthesia complications

## 2012-05-21 NOTE — Anesthesia Postprocedure Evaluation (Signed)
  Anesthesia Post-op Note  Patient: Priscilla [name]  Procedure(s) Performed: Procedure(s): Excision non healing ABDOMINAL WOUND (N/A)  Patient Location: PACU  Anesthesia Type:General  Level of Consciousness: awake, alert  and oriented  Airway and Oxygen Therapy: Patient Spontanous Breathing and Patient connected to nasal cannula oxygen  Post-op Pain: mild  Post-op Assessment: Post-op Vital signs reviewed  Post-op Vital Signs: Reviewed  Complications: No apparent anesthesia complications

## 2012-05-21 NOTE — Op Note (Signed)
Excision non healing ABDOMINAL WOUND  Procedure Note  Quilla Freeze 05/21/2012   Pre-op Diagnosis: non healing surgical wound     Post-op Diagnosis: same  Procedure(s): Excision non healing ABDOMINAL WOUND  Surgeon(s): Shelly Rubenstein, MD  Anesthesia: General  Staff:  Circulator: Gerre Pebbles Sipsis, RN Scrub Person: Janeece Agee Pingue, CST  Estimated Blood Loss: Minimal              Priscilla [name]   Date: 05/21/2012  Time: 9:11 AM

## 2012-05-21 NOTE — Interval H&P Note (Signed)
History and Physical Interval Note: No change in H and P  05/21/2012 8:09 AM  Priscilla [name]  has presented today for surgery, with the diagnosis of non healing surgial wound  The various methods of treatment have been discussed with the patient and family. After consideration of risks, benefits and other options for treatment, the patient has consented to  Procedure(s): Excision non healing ABDOMINAL WOUND (N/A) as a surgical intervention .  The patient's history has been reviewed, patient examined, no change in status, stable for surgery.  I have reviewed the patient's chart and labs.  Questions were answered to the patient's satisfaction.     [name] A

## 2012-05-22 NOTE — Op Note (Signed)
NAME:  Priscilla [name], Priscilla [name]              ACCOUNT NO.:  [id number]  MEDICAL RECORD NO.:  [id number]  LOCATION:  [facility]                         FACILITY:  [facility]  PHYSICIAN:  Abigail Miyamoto, M.D. DATE OF BIRTH:  22-Jun-1930  DATE OF PROCEDURE:  05/21/2012 DATE OF DISCHARGE:  05/21/2012                              OPERATIVE REPORT   PREOPERATIVE DIAGNOSIS:  Nonhealing abdominal wound.  POSTOPERATIVE DIAGNOSIS:  Nonhealing abdominal wound.  PROCEDURE:  Excision of nonhealing abdominal wound.  SURGEON:  Abigail Miyamoto, M.D.  ANESTHESIA:  General and 0.5% Marcaine.  ESTIMATED BLOOD LOSS:  Minimal.  INDICATIONS:  This is a 79 year old female who has had previous exploratory laparotomy.  She had a chronic nonhealing wound for over a year.  This has failed conservative measures at closing with wound VAC, wet-to-dry dressing, Hydrogel, wound gel, etc.  Decision made to proceed with excising this area.  PROCEDURE IN DETAIL:  The patient was brought to the operating room, identified as [name].  She was placed supine on the operating table and general anesthesia was induced.  Her abdomen was then prepped and draped in usual sterile fashion.  I anesthetized the skin around the open granulation tissue with Marcaine.  I then performed elliptical incision including all the area of chronic granulation tissue.  I took this down into the subcutaneous tissue with electrocautery.  I then wanted to excise this area going down to the fascia with electrocautery. The patient actually had a fascial defect underneath this area.  Once I excised the area, I closed the fascial defect with figure-of-eight #1 Novafil sutures.  I then had to undermine the skin circumferentially with electrocautery in order to facilitate closure of the wound.  The total length of the wound was approximately 12 cm.  I then irrigated the wounds thoroughly with saline.  I anesthetized the skin and fascia further with  Marcaine.  I closed the subcutaneous tissue with interrupted 3-0 Vicryl sutures and closed the skin with running 4-0 Monocryl.  Steri-Strips, gauze, and tape were then applied.  The patient tolerated the procedure well.  All counts were correct at the end of the procedure.  The patient was then extubated in the operating room and taken in a stable condition to the recovery room.    Abigail Miyamoto, M.D.    DB/MEDQ  D:  05/21/2012  T:  05/22/2012  Job:  [id number]

## 2012-05-24 ENCOUNTER — Encounter (HOSPITAL_COMMUNITY): Payer: Self-pay | Admitting: Surgery

## 2012-06-01 ENCOUNTER — Encounter (INDEPENDENT_AMBULATORY_CARE_PROVIDER_SITE_OTHER): Payer: Self-pay | Admitting: Surgery

## 2012-06-01 ENCOUNTER — Ambulatory Visit (INDEPENDENT_AMBULATORY_CARE_PROVIDER_SITE_OTHER): Payer: Medicare Other | Admitting: Surgery

## 2012-06-01 VITALS — BP 140/58 | HR 60 | Temp 97.8°F | Resp 14 | Ht 61.0 in | Wt 154.0 lb

## 2012-06-01 DIAGNOSIS — Z09 Encounter for follow-up examination after completed treatment for conditions other than malignant neoplasm: Secondary | ICD-10-CM

## 2012-06-01 NOTE — Progress Notes (Signed)
Subjective:     Patient ID: Priscilla [name], female   DOB: 02-07-1931, 79 y.o.   MRN: [id number]  HPI She is here for her first postop visit status post excision of a chronic draining abdominal wound. She is doing well and has no complaints.  Review of Systems     Objective:   Physical Exam On exam, the incision is healing well without evidence of infection    Assessment:     Patient stable postop     Plan:     She will still refrain from from heavy lifting for 3 more weeks. I will see her back in one month just to make sure a hernia has not developed

## 2012-06-07 ENCOUNTER — Encounter (INDEPENDENT_AMBULATORY_CARE_PROVIDER_SITE_OTHER): Payer: Medicare Other | Admitting: Surgery

## 2012-06-28 ENCOUNTER — Ambulatory Visit (INDEPENDENT_AMBULATORY_CARE_PROVIDER_SITE_OTHER): Payer: Medicare Other | Admitting: Surgery

## 2012-06-28 ENCOUNTER — Encounter (INDEPENDENT_AMBULATORY_CARE_PROVIDER_SITE_OTHER): Payer: Self-pay | Admitting: Surgery

## 2012-06-28 VITALS — BP 142/58 | HR 73 | Temp 97.6°F | Resp 18 | Ht 61.0 in | Wt 158.4 lb

## 2012-06-28 DIAGNOSIS — Z09 Encounter for follow-up examination after completed treatment for conditions other than malignant neoplasm: Secondary | ICD-10-CM

## 2012-06-28 NOTE — Progress Notes (Signed)
Subjective:     Patient ID: Priscilla [name], female   DOB: 02/18/1931, 80 y.o.   MRN: [id number]  HPI She is doing well and has no complaints.  Review of Systems     Objective:   Physical Exam The midline wound is now well healed    Assessment:     Patient stable postop     Plan:     I will see her back as needed

## 2012-06-29 ENCOUNTER — Encounter (INDEPENDENT_AMBULATORY_CARE_PROVIDER_SITE_OTHER): Payer: Medicare Other | Admitting: Surgery

## 2012-08-02 ENCOUNTER — Ambulatory Visit (INDEPENDENT_AMBULATORY_CARE_PROVIDER_SITE_OTHER): Payer: Medicare Other | Admitting: Cardiology

## 2012-08-02 ENCOUNTER — Encounter: Payer: Self-pay | Admitting: Cardiology

## 2012-08-02 VITALS — BP 120/64 | HR 62 | Ht 61.0 in | Wt 155.2 lb

## 2012-08-02 DIAGNOSIS — IMO0002 Reserved for concepts with insufficient information to code with codable children: Secondary | ICD-10-CM

## 2012-08-02 DIAGNOSIS — Z8679 Personal history of other diseases of the circulatory system: Secondary | ICD-10-CM | POA: Diagnosis not present

## 2012-08-02 DIAGNOSIS — I119 Hypertensive heart disease without heart failure: Secondary | ICD-10-CM | POA: Diagnosis not present

## 2012-08-02 DIAGNOSIS — S31109S Unspecified open wound of abdominal wall, unspecified quadrant without penetration into peritoneal cavity, sequela: Secondary | ICD-10-CM

## 2012-08-02 MED ORDER — ATENOLOL 50 MG PO TABS: 50.0000 mg | ORAL_TABLET | Freq: Two times a day (BID) | ORAL | Status: DC

## 2012-08-02 NOTE — Patient Instructions (Addendum)
Your physician recommends that you continue on your current medications as directed. Please refer to the Current Medication list given to you today.  Your physician wants you to follow-up in: 6 month ov You will receive a reminder letter in the mail two months in advance. If you don't receive a letter, please call our office to schedule the follow-up appointment.  

## 2012-08-02 NOTE — Assessment & Plan Note (Signed)
The patient has a history of mild mitral regurgitation and moderate aortic insufficiency.  She is not having symptoms of CHF.

## 2012-08-02 NOTE — Progress Notes (Signed)
Priscilla [name] Date of Birth:  21-Dec-1930 [facility] [address] [place], [place]  [address] [phone]         Fax   [phone]  History of Present Illness: This pleasant 79 year old woman is seen for a scheduled followup office visit. She has a past history of essential hypertension and a history of mild aortic valve disease with aortic stenosis and aortic insufficiency. Her last echocardiogram in March 2010 showed moderate aortic insufficiency with mild mitral regurgitation and normal LV function. She does not have any history of ischemic heart disease. She has had a previous right total knee replacement by Dr. Priscille Kluver. She is still having problems with her knees but does not anticipate any further surgery. In November she was admitted to [facility] with small bowel obstruction and required surgery.  Recently she underwent successful closure of a small wound abscess.   Current Outpatient Prescriptions  Medication Sig Dispense Refill  . aspirin 81 MG chewable tablet Chew 81 mg by mouth daily.      Marland Kitchen atenolol (TENORMIN) 50 MG tablet Take 1 tablet (50 mg total) by mouth 2 (two) times daily.  180 tablet  3  . CALCIUM-VITAMIN D PO Take 1 tablet by mouth 2 (two) times daily.      Marland Kitchen losartan-hydrochlorothiazide (HYZAAR) 100-12.5 MG per tablet Take 1 tablet by mouth daily.  90 tablet  3  . nitroGLYCERIN (NITROSTAT) 0.4 MG SL tablet Place 0.4 mg under the tongue every 5 (five) minutes as needed for chest pain.      . traMADol (ULTRAM) 50 MG tablet Take 50 mg by mouth every 6 (six) hours as needed for pain.        No current facility-administered medications for this visit.    Allergies  Allergen Reactions  . Morphine And Related Nausea And Vomiting  . Lisinopril Cough    Patient Active Problem List   Diagnosis Date Noted  . History of valvular heart disease 08/22/2010    Priority: High  . Benign hypertensive heart disease without heart failure  08/22/2010    Priority: High  . Osteoarthritis 08/22/2010    Priority: Medium  . Medicare annual wellness visit, initial 09/11/2011  . Open abdominal wall wound 08/12/2011  . Cancer 08/12/2011  . C. difficile colitis 02/18/2011  . Anemia due to blood loss, acute 02/09/2011  . SBO (small bowel obstruction) 02/05/2011  . Acute renal failure 01/28/2011  . Enteritis 01/28/2011  . Hypotension 01/28/2011  . Rectal cancer 12/04/2010  . History of colon cancer 08/22/2010    History  Smoking status  . Never Smoker   Smokeless tobacco  . Never Used    History  Alcohol Use No    Family History  Problem Relation Age of Onset  . Heart disease Mother     CHF, valve problems  . Heart disease Father     due to lupus  . Lupus Father   . Cancer Paternal Grandmother     colon  . Coronary artery disease Brother     s/p CABG  . Coronary artery disease Sister     s/p CABG  . Diabetes Neg Hx     Review of Systems: Constitutional: no fever chills diaphoresis or fatigue or change in weight.  Head and neck: no hearing loss, no epistaxis, no photophobia or visual disturbance. Respiratory: No cough, shortness of breath or wheezing. Cardiovascular: No chest pain peripheral edema, palpitations. Gastrointestinal: No abdominal distention, no abdominal pain, no change  in bowel habits hematochezia or melena. Genitourinary: No dysuria, no frequency, no urgency, no nocturia. Musculoskeletal:No arthralgias, no back pain, no gait disturbance or myalgias. Neurological: No dizziness, no headaches, no numbness, no seizures, no syncope, no weakness, no tremors. Hematologic: No lymphadenopathy, no easy bruising. Psychiatric: No confusion, no hallucinations, no sleep disturbance.    Physical Exam: Filed Vitals:   08/02/12 1344  BP: 120/64  Pulse: 62   the general appearance reveals a well-developed well-nourished woman in no distress.The head and neck exam reveals pupils equal and reactive.   Extraocular movements are full.  There is no scleral icterus.  The mouth and pharynx are normal.  The neck is supple.  The carotids reveal no bruits.  The jugular venous pressure is normal.  The  thyroid is not enlarged.  There is no lymphadenopathy.  The chest is clear to percussion and auscultation.  There are no rales or rhonchi.  Expansion of the chest is symmetrical.  The precordium is quiet.  The first heart sound is normal.  The second heart sound is physiologically split.  There is no murmur gallop rub or click.  There is no abnormal lift or heave.  The abdomen is soft and nontender.  The bowel sounds are normal.  The liver and spleen are not enlarged.  There are no abdominal masses.  There are no abdominal bruits.  Extremities reveal good pedal pulses.  There is no phlebitis or edema.  There is no cyanosis or clubbing.  Strength is normal and symmetrical in all extremities.  There is no lateralizing weakness.  There are no sensory deficits.  The skin is warm and dry.  There is no rash.  EKG shows normal sinus rhythm with first degree AV block and no ischemic changes.  She has left axis deviation.   Assessment / Plan: Continue same medication.  We refilled her atenolol.  Recheck in 6 months for followup office visit

## 2012-08-02 NOTE — Assessment & Plan Note (Signed)
The open abdominal wall wound most recently surgically close and is not showing any evidence of further problems or drainage

## 2012-08-02 NOTE — Assessment & Plan Note (Signed)
The patient has not been experiencing any chest pain or shortness of breath.  No palpitations dizziness or syncope

## 2012-08-29 ENCOUNTER — Other Ambulatory Visit: Payer: Self-pay | Admitting: Family Medicine

## 2012-08-29 DIAGNOSIS — D62 Acute posthemorrhagic anemia: Secondary | ICD-10-CM

## 2012-08-29 DIAGNOSIS — C20 Malignant neoplasm of rectum: Secondary | ICD-10-CM

## 2012-08-29 DIAGNOSIS — I119 Hypertensive heart disease without heart failure: Secondary | ICD-10-CM

## 2012-09-02 ENCOUNTER — Ambulatory Visit (INDEPENDENT_AMBULATORY_CARE_PROVIDER_SITE_OTHER): Payer: Medicare Other | Admitting: Family Medicine

## 2012-09-02 ENCOUNTER — Encounter: Payer: Self-pay | Admitting: Family Medicine

## 2012-09-02 ENCOUNTER — Other Ambulatory Visit (INDEPENDENT_AMBULATORY_CARE_PROVIDER_SITE_OTHER): Payer: Medicare Other

## 2012-09-02 VITALS — BP 162/70 | HR 60 | Temp 98.1°F | Wt 156.0 lb

## 2012-09-02 DIAGNOSIS — I119 Hypertensive heart disease without heart failure: Secondary | ICD-10-CM | POA: Diagnosis not present

## 2012-09-02 DIAGNOSIS — H6121 Impacted cerumen, right ear: Secondary | ICD-10-CM

## 2012-09-02 DIAGNOSIS — Z0000 Encounter for general adult medical examination without abnormal findings: Secondary | ICD-10-CM | POA: Diagnosis not present

## 2012-09-02 DIAGNOSIS — D62 Acute posthemorrhagic anemia: Secondary | ICD-10-CM | POA: Diagnosis not present

## 2012-09-02 DIAGNOSIS — H6120 Impacted cerumen, unspecified ear: Secondary | ICD-10-CM | POA: Insufficient documentation

## 2012-09-02 LAB — COMPREHENSIVE METABOLIC PANEL
AST: 19 U/L (ref 0–37)
Alkaline Phosphatase: 67 U/L (ref 39–117)
BUN: 15 mg/dL (ref 6–23)
Calcium: 9.3 mg/dL (ref 8.4–10.5)
Chloride: 106 mEq/L (ref 96–112)
Creatinine, Ser: 0.9 mg/dL (ref 0.4–1.2)

## 2012-09-02 LAB — CBC WITH DIFFERENTIAL/PLATELET
Basophils Relative: 1.2 % (ref 0.0–3.0)
Eosinophils Absolute: 0.1 10*3/uL (ref 0.0–0.7)
Eosinophils Relative: 1.9 % (ref 0.0–5.0)
Hemoglobin: 11.3 g/dL — ABNORMAL LOW (ref 12.0–15.0)
Lymphocytes Relative: 33.2 % (ref 12.0–46.0)
MCHC: 33.9 g/dL (ref 30.0–36.0)
Monocytes Relative: 11.1 % (ref 3.0–12.0)
Neutro Abs: 2.4 10*3/uL (ref 1.4–7.7)
RBC: 3.73 Mil/uL — ABNORMAL LOW (ref 3.87–5.11)

## 2012-09-02 NOTE — Progress Notes (Signed)
  Subjective:    Patient ID: Priscilla [name], female    DOB: 1930/10/25, 78 y.o.   MRN: [id number]  HPI CC: R ear fullness, popping and dizziness  1 wk h/o R ear popping and muffling.  Started after trip to mountains.  Has noted dizzy spells for last few days - no vertigo or presyncope - just lightheaded. No fevers/chills, pain, drainage, nasal congestion, HA, no recent swimming.  No hearing loss noted.  Today elevated BP noted but states at home bp running 120-140 systolic.  Will bring cuff to physical appt.  Abdominal wound has fully closed.  Past Medical History  Diagnosis Date  . Chest pain, atypical   . IDA (iron deficiency anemia)   . Heart murmur   . History of DVT (deep vein thrombosis) 2002    right leg, after chemo for first cancer, treated  . Osteoarthritis   . Cancer 2001, 2008    colon, rectal  . Small bowel obstruction 2012    adhesion related  . History of chicken pox   . Osteopenia 09/2011    DEXA spine -2.2, femur -1.6  . Hypertension     dr Patty Sermons  . Open wound of abdominal wall with complication      Review of Systems Per HPI    Objective:   Physical Exam  Nursing note and vitals reviewed. Constitutional: She appears well-developed and well-nourished. No distress.  HENT:  Head: Normocephalic and atraumatic.  Right Ear: Hearing, tympanic membrane, external ear and ear canal normal.  Left Ear: Hearing, tympanic membrane, external ear and ear canal normal.  Nose: Nose normal. No mucosal edema or rhinorrhea.  Mouth/Throat: Uvula is midline, oropharynx is clear and moist and mucous membranes are normal. No oropharyngeal exudate, posterior oropharyngeal edema, posterior oropharyngeal erythema or tonsillar abscesses.  R external canal with dry ear wax present - removed with alligator forceps and plastic curette  Eyes: Conjunctivae and EOM are normal. Pupils are equal, round, and reactive to light. No scleral icterus.  Neck: Normal range of motion. Neck  supple.  Lymphadenopathy:    She has no cervical adenopathy.       Assessment & Plan:

## 2012-09-02 NOTE — Assessment & Plan Note (Signed)
Removed with alligator forceps - pt tolerated well.  After cleaning, pt endorsed improved sxs.

## 2012-09-09 ENCOUNTER — Ambulatory Visit (INDEPENDENT_AMBULATORY_CARE_PROVIDER_SITE_OTHER): Payer: Medicare Other | Admitting: Family Medicine

## 2012-09-09 ENCOUNTER — Encounter: Payer: Self-pay | Admitting: Family Medicine

## 2012-09-09 VITALS — BP 144/64 | HR 64 | Temp 98.0°F | Ht 61.0 in | Wt 157.2 lb

## 2012-09-09 DIAGNOSIS — Z0000 Encounter for general adult medical examination without abnormal findings: Secondary | ICD-10-CM

## 2012-09-09 DIAGNOSIS — M949 Disorder of cartilage, unspecified: Secondary | ICD-10-CM | POA: Diagnosis not present

## 2012-09-09 DIAGNOSIS — R1012 Left upper quadrant pain: Secondary | ICD-10-CM | POA: Diagnosis not present

## 2012-09-09 DIAGNOSIS — I119 Hypertensive heart disease without heart failure: Secondary | ICD-10-CM | POA: Diagnosis not present

## 2012-09-09 DIAGNOSIS — C20 Malignant neoplasm of rectum: Secondary | ICD-10-CM

## 2012-09-09 DIAGNOSIS — Z85038 Personal history of other malignant neoplasm of large intestine: Secondary | ICD-10-CM

## 2012-09-09 DIAGNOSIS — M8580 Other specified disorders of bone density and structure, unspecified site: Secondary | ICD-10-CM | POA: Insufficient documentation

## 2012-09-09 MED ORDER — CALCIUM-VITAMIN D 600-400 MG-UNIT PO TABS: 1.0000 | ORAL_TABLET | Freq: Every day | ORAL | Status: DC

## 2012-09-09 NOTE — Assessment & Plan Note (Signed)
overall benign exam - will obtain abd [place] to further eval spleen and L kidney given h/o cancer

## 2012-09-09 NOTE — Progress Notes (Signed)
Subjective:    Patient ID: Priscilla [name], female    DOB: 02-03-1931, 79 y.o.   MRN: [id number]  HPI CC: subsequent med wellness visit  H/o colon and rectal cancer s/p colectomy and small bowel removal for obstruction.    HTN - at home bp runs 120 systolic.  Brought BP cuff today to compare - on home cuff today 152/54, HR 59.  No changes today.  LUQ pain - ongoing for months.  No weight loss, early satiety, nausea/vomiting, or fevers/chills.  Worried about pancreas.  Left lateral leg pain - worse at night, sometimes keeps her up.  Throbbing pain.  No paresthesias or weakness.  H/o knee problems in the past, has seen ortho.  Has tried meloxicam which didn't really help.  Hearing screen normal today.  Vision screen normal today.  Last had eye exam 1 yr ago.  Has appt pending.  Denies falls in last year.  Denies depression, sadness, anhedonia.  Preventative:  Pap smear - 2010 (thinks). S/p hysterectomy for fibroid tumors. Doesn't need rpt Mammogram - 08/2011 WNL. Done at breast center. Has upcoming later this month H/o colon and rectal cancer. H/o bowel resection after SBO.  Sees Dr Madilyn Fireman GI. pneumonia shot 2009  Declines shingles shot. Has had shingles. Td 08/2011 Flu - 11/2011 Td - 08/2011 DEXA 09/2011 - osteopenia. Advanced directives - has living will at home. HCPOA would be son Priscilla [name]) and daughter Priscilla [name]), also would be ok with other children helping.   Caffeine: decaf  Lives alone, daughter lives nearby to visit. Widow for 16 yrs.  Occupation: retired, worked at [employer] office  Edu: HS  Activity: tries to get walking, walks up and down steps at home  Diet: good water, fruits/vegetables daily.  1 cup milk/day, cheese.   Medications and allergies reviewed and updated in chart.  Past histories reviewed and updated if relevant as below. Patient Active Problem List   Diagnosis Date Noted  . Cerumen impaction 09/02/2012  . Medicare annual wellness visit, initial 09/11/2011  .  Open abdominal wall wound 08/12/2011  . C. difficile colitis 02/18/2011  . Anemia due to blood loss, acute 02/09/2011  . SBO (small bowel obstruction) 02/05/2011  . Acute renal failure 01/28/2011  . Enteritis 01/28/2011  . Hypotension 01/28/2011  . Rectal cancer 12/04/2010  . History of valvular heart disease 08/22/2010  . Benign hypertensive heart disease without heart failure 08/22/2010  . Osteoarthritis 08/22/2010  . History of colon cancer 08/22/2010   Past Medical History  Diagnosis Date  . Chest pain, atypical   . IDA (iron deficiency anemia)   . Heart murmur   . History of DVT (deep vein thrombosis) 2002    right leg, after chemo for first cancer, treated  . Osteoarthritis   . Cancer 2001, 2008    colon, rectal  . Small bowel obstruction 2012    adhesion related  . History of chicken pox   . Osteopenia 09/2011    DEXA spine -2.2, femur -1.6  . Hypertension     dr Patty Sermons  . Open wound of abdominal wall with complication    Past Surgical History  Procedure Laterality Date  . Nuclear stress test  11/17/08    NORMAL  . Replacement total knee  2010    RIGHT KNEE  . Colonoscopy  02/2010  . Ileostomy  Q  . Colon surgery  2001 and 2008  . Appendectomy  1984  . Colectomy    . Colostomy    . Abdominal  hysterectomy  1984  . Joint replacement  2010    knee   . Laparotomy  02/05/2011    Procedure: EXPLORATORY LAPAROTOMY;  Surgeon: Shelly Rubenstein, MD;  Location: [facility] OR;  Service: General;  Laterality: N/A;  lysis of adhesions.  . Bowel resection  02/05/2011    Procedure: SMALL BOWEL RESECTION;  Surgeon: Shelly Rubenstein, MD;  Location: [facility] OR;  Service: General;  Laterality: N/A;  . Esophagogastroduodenoscopy  01/31/2011    Procedure: ESOPHAGOGASTRODUODENOSCOPY (EGD);  Surgeon: Barrie Folk, MD;  Location: [facility] ENDOSCOPY;  Service: Endoscopy;  Laterality: N/A;  duodenal bx;s r/o giardia, r/o celiac diease  . Flexible sigmoidoscopy  01/31/2011    Procedure: FLEXIBLE  SIGMOIDOSCOPY;  Surgeon: Barrie Folk, MD;  Location: [facility] ENDOSCOPY;  Service: Endoscopy;  Laterality: N/A;  . Dexa  09/2011    T-score: spine -2.2, femur -1.6  . Broken nose    . Wound debridement N/A 05/21/2012    Procedure: Excision non healing ABDOMINAL WOUND;  Surgeon: Shelly Rubenstein, MD;  Location: [facility] OR;  Service: General;  Laterality: N/A;   History  Substance Use Topics  . Smoking status: Never Smoker   . Smokeless tobacco: Never Used  . Alcohol Use: No   Family History  Problem Relation Age of Onset  . Heart disease Mother     CHF, valve problems  . Heart disease Father     due to lupus  . Lupus Father   . Cancer Paternal Grandmother     colon  . Coronary artery disease Brother     s/p CABG  . Coronary artery disease Sister     s/p CABG  . Diabetes Neg Hx    Allergies  Allergen Reactions  . Morphine And Related Nausea And Vomiting  . Lisinopril Cough   Current Outpatient Prescriptions on File Prior to Visit  Medication Sig Dispense Refill  . aspirin 81 MG chewable tablet Chew 81 mg by mouth daily.      Marland Kitchen atenolol (TENORMIN) 50 MG tablet Take 1 tablet (50 mg total) by mouth 2 (two) times daily.  180 tablet  3  . CALCIUM-VITAMIN D PO Take 1 tablet by mouth 2 (two) times daily.      Marland Kitchen losartan-hydrochlorothiazide (HYZAAR) 100-12.5 MG per tablet Take 1 tablet by mouth daily.  90 tablet  3  . nitroGLYCERIN (NITROSTAT) 0.4 MG SL tablet Place 0.4 mg under the tongue every 5 (five) minutes as needed for chest pain.      . traMADol (ULTRAM) 50 MG tablet Take 50 mg by mouth every 6 (six) hours as needed for pain.        No current facility-administered medications on file prior to visit.     Review of Systems  Constitutional: Negative for fever, chills, activity change, appetite change, fatigue and unexpected weight change.  HENT: Negative for hearing loss and neck pain.   Eyes: Negative for visual disturbance.  Respiratory: Negative for cough, chest tightness,  shortness of breath and wheezing.   Cardiovascular: Negative for chest pain, palpitations and leg swelling.  Gastrointestinal: Positive for diarrhea (after bowel resection). Negative for nausea, vomiting, abdominal pain, constipation, blood in stool and abdominal distention.  Genitourinary: Negative for hematuria and difficulty urinating.  Musculoskeletal: Negative for myalgias and arthralgias.  Skin: Negative for rash.  Neurological: Positive for dizziness (with standing up too quickly). Negative for seizures, syncope and headaches.  Hematological: Negative for adenopathy. Does not bruise/bleed easily.  Psychiatric/Behavioral: Negative for dysphoric mood.  The patient is not nervous/anxious.        Objective:   Physical Exam  Nursing note and vitals reviewed. Constitutional: She is oriented to person, place, and time. She appears well-developed and well-nourished. No distress.  HENT:  Head: Normocephalic and atraumatic.  Right Ear: Hearing, tympanic membrane, external ear and ear canal normal.  Left Ear: Hearing, tympanic membrane, external ear and ear canal normal.  Nose: Nose normal.  Mouth/Throat: Oropharynx is clear and moist. No oropharyngeal exudate.  Eyes: Conjunctivae and EOM are normal. Pupils are equal, round, and reactive to light. No scleral icterus.  Neck: Normal range of motion. Neck supple. Carotid bruit is not present. No thyromegaly present.  Cardiovascular: Normal rate, regular rhythm, normal heart sounds and intact distal pulses.   No murmur heard. Pulses:      Radial pulses are 2+ on the right side, and 2+ on the left side.  Pulmonary/Chest: Effort normal and breath sounds normal. No respiratory distress. She has no wheezes. She has no rales. Right breast exhibits no inverted nipple, no mass, no nipple discharge, no skin change and no tenderness. Left breast exhibits no inverted nipple, no mass, no nipple discharge, no skin change and no tenderness.  Abdominal: Soft.  Normal appearance and bowel sounds are normal. She exhibits no distension and no mass. There is no hepatosplenomegaly. There is tenderness (mild to deep palpation) in the left upper quadrant. There is no rebound, no guarding and no CVA tenderness. No hernia.  Midline incision  Musculoskeletal: Normal range of motion. She exhibits no edema.  Lymphadenopathy:    She has no cervical adenopathy.    She has no axillary adenopathy.       Right axillary: No lateral adenopathy present.       Left axillary: No lateral adenopathy present.      Right: No supraclavicular adenopathy present.       Left: No supraclavicular adenopathy present.  Neurological: She is alert and oriented to person, place, and time.  CN grossly intact, station and gait intact  Skin: Skin is warm and dry. No rash noted.  Psychiatric: She has a normal mood and affect. Her behavior is normal. Judgment and thought content normal.       Assessment & Plan:

## 2012-09-09 NOTE — Assessment & Plan Note (Signed)
I have personally reviewed the Medicare Annual Wellness questionnaire and have noted 1. The patient's medical and social history 2. Their use of alcohol, tobacco or illicit drugs 3. Their current medications and supplements 4. The patient's functional ability including ADL's, fall risks, home safety risks and hearing or visual impairment. 5. Diet and physical activity 6. Evidence for depression or mood disorders The patients weight, height, BMI have been recorded in the chart.  Hearing and vision has been addressed. I have made referrals, counseling and provided education to the patient based review of the above and I have provided the pt with a written personalized care plan for preventive services. See scanned questionairre. Advanced directives discussed: has living will at home.  Reviewed preventative protocols and updated unless pt declined. UTD immunizations - will check on shingles with insurance. I asked her to contact GI about when next f/u surveillance is due for h/o colon and rectal cancer.

## 2012-09-09 NOTE — Assessment & Plan Note (Signed)
Discussed calcium/vit d recommended dietary/supplement intake

## 2012-09-09 NOTE — Patient Instructions (Signed)
Call your insurance about the shingles shot to see if it is covered or how much it would cost and where is cheaper (here or pharmacy).  If you want to receive here, call for nurse visit. Call Dr. Madilyn Fireman office to inquire about when next colonoscopy or other surveillance is due in history of colon cancer. Pass by Marion's office for referral for abdominal ultrasound in the next week. Good to see you today, return as needed or in 1 year for next wellness visit.

## 2012-09-09 NOTE — Assessment & Plan Note (Signed)
Chronic, stable. No changes today as  Endorses BP running better at home.

## 2012-09-13 ENCOUNTER — Ambulatory Visit
Admission: RE | Admit: 2012-09-13 | Discharge: 2012-09-13 | Disposition: A | Discharge status: Home / Self Care | Payer: Medicare Other | Source: Ambulatory Visit | Attending: Family Medicine | Admitting: Family Medicine

## 2012-09-13 DIAGNOSIS — R1012 Left upper quadrant pain: Secondary | ICD-10-CM

## 2012-10-18 DIAGNOSIS — H2510 Age-related nuclear cataract, unspecified eye: Secondary | ICD-10-CM | POA: Diagnosis not present

## 2012-10-18 DIAGNOSIS — H25019 Cortical age-related cataract, unspecified eye: Secondary | ICD-10-CM | POA: Diagnosis not present

## 2012-12-06 ENCOUNTER — Other Ambulatory Visit: Payer: Self-pay | Admitting: Family Medicine

## 2012-12-13 DIAGNOSIS — Z1231 Encounter for screening mammogram for malignant neoplasm of breast: Secondary | ICD-10-CM | POA: Diagnosis not present

## 2012-12-14 ENCOUNTER — Encounter: Payer: Self-pay | Admitting: Family Medicine

## 2012-12-22 HISTORY — PX: COLONOSCOPY: SHX174

## 2012-12-23 DIAGNOSIS — K625 Hemorrhage of anus and rectum: Secondary | ICD-10-CM | POA: Diagnosis not present

## 2012-12-23 DIAGNOSIS — Z85038 Personal history of other malignant neoplasm of large intestine: Secondary | ICD-10-CM | POA: Diagnosis not present

## 2013-01-03 DIAGNOSIS — Z23 Encounter for immunization: Secondary | ICD-10-CM | POA: Diagnosis not present

## 2013-01-17 DIAGNOSIS — K625 Hemorrhage of anus and rectum: Secondary | ICD-10-CM | POA: Diagnosis not present

## 2013-01-17 DIAGNOSIS — K6389 Other specified diseases of intestine: Secondary | ICD-10-CM | POA: Diagnosis not present

## 2013-01-17 DIAGNOSIS — Z85038 Personal history of other malignant neoplasm of large intestine: Secondary | ICD-10-CM | POA: Diagnosis not present

## 2013-01-24 ENCOUNTER — Encounter: Payer: Self-pay | Admitting: Family Medicine

## 2013-01-25 ENCOUNTER — Ambulatory Visit (INDEPENDENT_AMBULATORY_CARE_PROVIDER_SITE_OTHER): Payer: Medicare Other | Admitting: Cardiology

## 2013-01-25 ENCOUNTER — Encounter: Payer: Self-pay | Admitting: Cardiology

## 2013-01-25 VITALS — BP 146/46 | HR 60 | Ht 61.0 in | Wt 156.0 lb

## 2013-01-25 DIAGNOSIS — C20 Malignant neoplasm of rectum: Secondary | ICD-10-CM

## 2013-01-25 DIAGNOSIS — I359 Nonrheumatic aortic valve disorder, unspecified: Secondary | ICD-10-CM

## 2013-01-25 DIAGNOSIS — I119 Hypertensive heart disease without heart failure: Secondary | ICD-10-CM | POA: Diagnosis not present

## 2013-01-25 NOTE — Progress Notes (Signed)
Priscilla [name] Date of Birth:  Jan 31, 1931 [address] [place], [place]  [address] [phone]         Fax   [phone]  History of Present Illness: This pleasant 79 year old woman is seen for a scheduled followup office visit. She has a past history of essential hypertension and a history of mild aortic valve disease with aortic stenosis and aortic insufficiency. Her last echocardiogram in March 2010 showed moderate aortic insufficiency with mild mitral regurgitation and normal LV function. She does not have any history of ischemic heart disease. She has had a previous right total knee replacement by Dr. Priscille Kluver. She is still having problems with her knees but does not anticipate any further surgery.  She is now seeing Dr. Madelon Lips concerning her knees. In November 2013 she was admitted to [facility] with small bowel obstruction and required surgery.  She has not been experiencing any new cardiac symptoms.  She had a colonoscopy last month by Dr. Evette Cristal which was okay.  He complains of lack of stamina.  Her last hemoglobin in June 2014 was 11.   Current Outpatient Prescriptions  Medication Sig Dispense Refill  . aspirin 81 MG chewable tablet Chew 81 mg by mouth daily.      Marland Kitchen atenolol (TENORMIN) 50 MG tablet Take 1 tablet (50 mg total) by mouth 2 (two) times daily.  180 tablet  3  . Biotin 5 MG TABS Take 1 tablet by mouth daily.      . Calcium Carb-Cholecalciferol (CALCIUM-VITAMIN D) 600-400 MG-UNIT TABS Take 1 tablet by mouth daily.      . clindamycin (CLEOCIN) 150 MG capsule For dental appointments      . losartan-hydrochlorothiazide (HYZAAR) 100-12.5 MG per tablet TAKE 1 TABLET BY MOUTH EVERY DAY  90 tablet  3  . nitroGLYCERIN (NITROSTAT) 0.4 MG SL tablet Place 0.4 mg under the tongue every 5 (five) minutes as needed for chest pain.      . polyethylene glycol-electrolytes (NULYTELY/GOLYTELY) 420 G solution       . [DISCONTINUED] CALCIUM-VITAMIN D PO Take 1 tablet  by mouth 2 (two) times daily.       No current facility-administered medications for this visit.    Allergies  Allergen Reactions  . Morphine And Related Nausea And Vomiting  . Lisinopril Cough    Patient Active Problem List   Diagnosis Date Noted  . History of valvular heart disease 08/22/2010    Priority: High  . Benign hypertensive heart disease without heart failure 08/22/2010    Priority: High  . Osteoarthritis 08/22/2010    Priority: Medium  . LUQ pain 09/09/2012  . Osteopenia 09/09/2012  . Medicare annual wellness visit, subsequent 09/11/2011  . C. difficile colitis 02/18/2011  . SBO (small bowel obstruction) 02/05/2011  . Rectal cancer 12/04/2010  . History of colon cancer 08/22/2010    History  Smoking status  . Never Smoker   Smokeless tobacco  . Never Used    History  Alcohol Use No    Family History  Problem Relation Age of Onset  . Heart disease Mother     CHF, valve problems  . Heart disease Father     due to lupus  . Lupus Father   . Cancer Paternal Grandmother     colon  . Coronary artery disease Brother     s/p CABG  . Coronary artery disease Sister     s/p CABG  . Diabetes Neg Hx  Review of Systems: Constitutional: no fever chills diaphoresis or fatigue or change in weight.  Head and neck: no hearing loss, no epistaxis, no photophobia or visual disturbance. Respiratory: No cough, shortness of breath or wheezing. Cardiovascular: No chest pain peripheral edema, palpitations. Gastrointestinal: No abdominal distention, no abdominal pain, no change in bowel habits hematochezia or melena. Genitourinary: No dysuria, no frequency, no urgency, no nocturia. Musculoskeletal:No arthralgias, no back pain, no gait disturbance or myalgias. Neurological: No dizziness, no headaches, no numbness, no seizures, no syncope, no weakness, no tremors. Hematologic: No lymphadenopathy, no easy bruising. Psychiatric: No confusion, no hallucinations, no sleep  disturbance.    Physical Exam: Filed Vitals:   01/25/13 1034  BP: 146/46  Pulse: 60   the general appearance reveals a well-developed well-nourished woman in no distress.The head and neck exam reveals pupils equal and reactive.  Extraocular movements are full.  There is no scleral icterus.  The mouth and pharynx are normal.  The neck is supple.  The carotids reveal no bruits.  The jugular venous pressure is normal.  The  thyroid is not enlarged.  There is no lymphadenopathy.  The chest is clear to percussion and auscultation.  There are no rales or rhonchi.  Expansion of the chest is symmetrical.  The precordium is quiet.  The first heart sound is normal.  The second heart sound is physiologically split.  There is no murmur gallop rub or click.  There is no abnormal lift or heave.  The abdomen is soft and nontender.  The bowel sounds are normal.  The liver and spleen are not enlarged.  There are no abdominal masses.  There are no abdominal bruits.  Extremities reveal good pedal pulses.  There is no phlebitis or edema.  There is no cyanosis or clubbing.  Strength is normal and symmetrical in all extremities.  There is no lateralizing weakness.  There are no sensory deficits.  The skin is warm and dry.  There is no rash.     Assessment / Plan: Continue same medication.  Recheck in 6 months for office visit and EKG.  She will be getting blood work at her annual physical with her PCP

## 2013-01-25 NOTE — Assessment & Plan Note (Signed)
Blood pressure has been remaining satisfactory.  She has a wide pulse pressure because of her aortic insufficiency.

## 2013-01-25 NOTE — Patient Instructions (Signed)
Your physician recommends that you continue on your current medications as directed. Please refer to the Current Medication list given to you today.  Your physician wants you to follow-up in: 6 month ov/ekg You will receive a reminder letter in the mail two months in advance. If you don't receive a letter, please call our office to schedule the follow-up appointment.  

## 2013-01-25 NOTE — Assessment & Plan Note (Signed)
She has not been having a hematochezia or melena.

## 2013-01-25 NOTE — Assessment & Plan Note (Signed)
The patient has moderate aortic insufficiency by echocardiogram.  Her last chest x-ray 05/20/12 showed only borderline cardiomegaly and no CHF.  She is not having any symptoms of CHF.  She does have some lack of stamina

## 2013-08-06 IMAGING — CR DG CHEST 1V PORT
1 series · 1 of 1 positions shown · non-contrast
Comparison: Chest x-ray of 02/02/2011

CLINICAL DATA: Central line placement

PORTABLE CHEST - 1 VIEW

[AP]
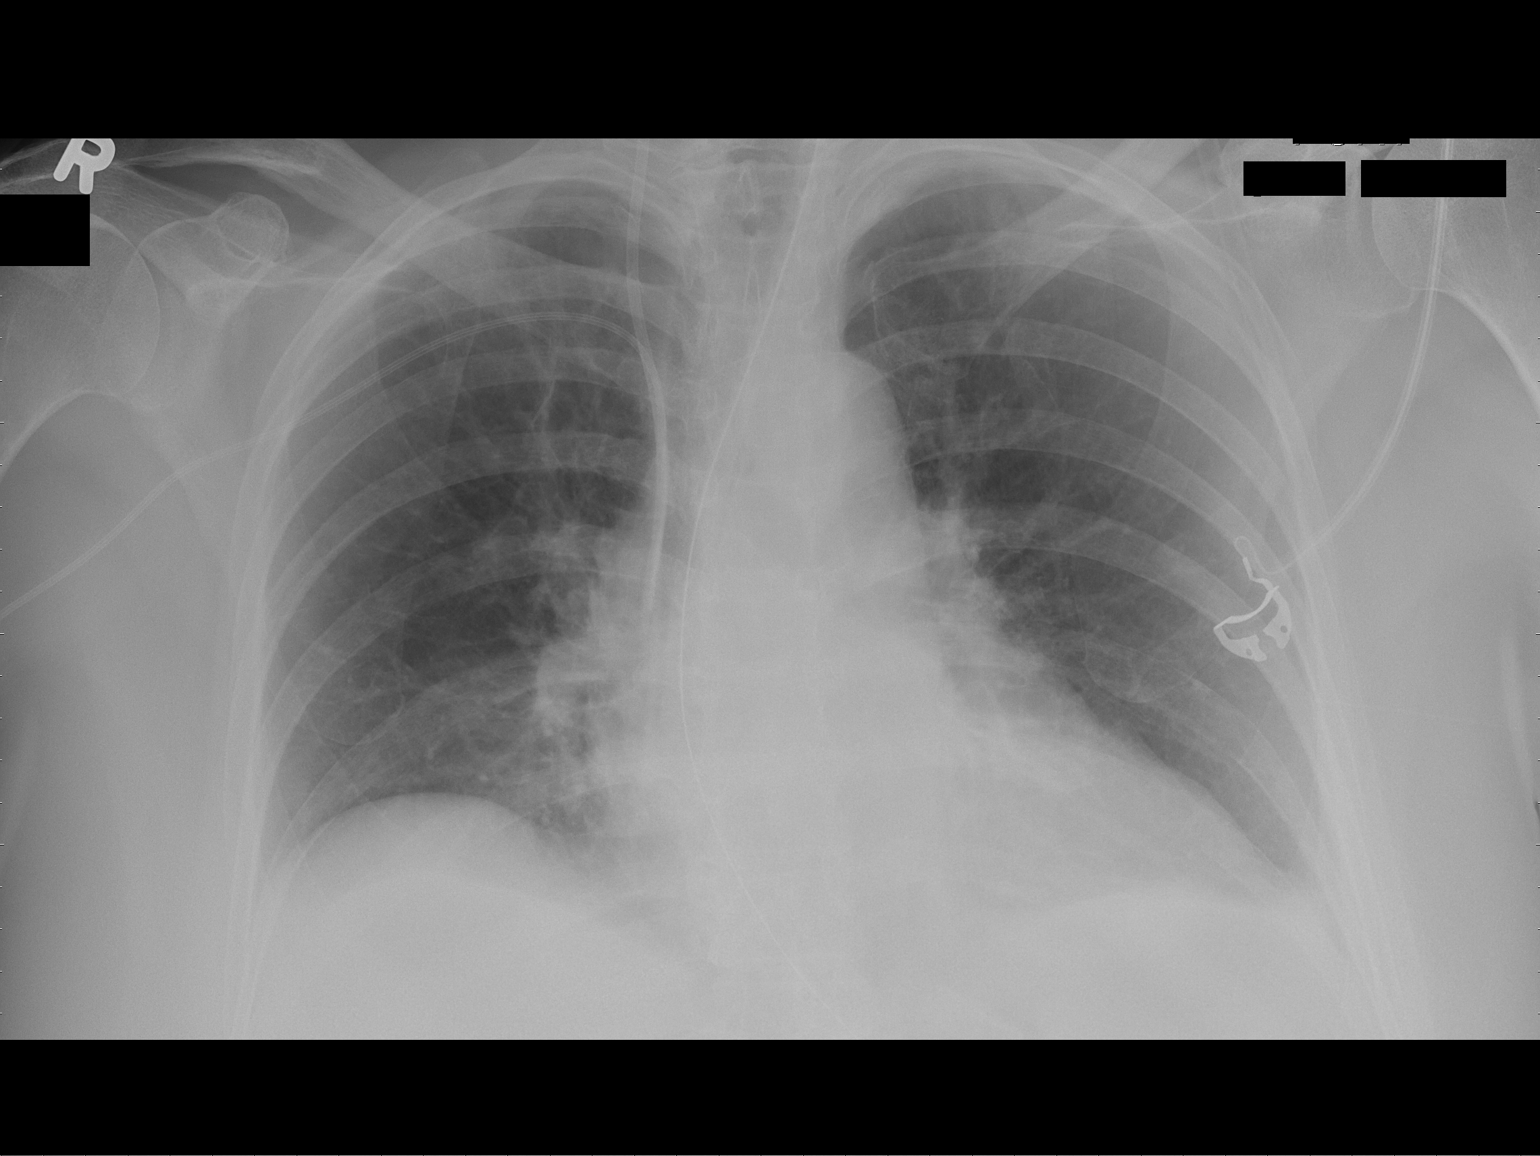

[1 of 1 positions shown; findings below may reference images not displayed]

FINDINGS: Both the right upper extremity central venous line and
the right IJ central venous line overlap with the tips in the
region of the lower SVC just above the expected right atrial
junction.  No pneumothorax is seen.  Mild basilar atelectasis is
noted.  There is cardiomegaly present.  An NG tube is present.
IMPRESSION: Both the right IJ and right upper extremity central venous lines
overlap with the tips in the lower SVC.  No pneumothorax.

## 2013-08-26 ENCOUNTER — Other Ambulatory Visit: Payer: Self-pay | Admitting: *Deleted

## 2013-08-26 DIAGNOSIS — I119 Hypertensive heart disease without heart failure: Secondary | ICD-10-CM

## 2013-08-26 MED ORDER — ATENOLOL 50 MG PO TABS: 50.0000 mg | ORAL_TABLET | Freq: Two times a day (BID) | ORAL | Status: DC

## 2013-09-01 ENCOUNTER — Ambulatory Visit (INDEPENDENT_AMBULATORY_CARE_PROVIDER_SITE_OTHER): Payer: Medicare Other | Admitting: Cardiology

## 2013-09-01 ENCOUNTER — Encounter: Payer: Self-pay | Admitting: Cardiology

## 2013-09-01 VITALS — BP 154/67 | HR 63 | Ht 61.0 in | Wt 157.0 lb

## 2013-09-01 DIAGNOSIS — I119 Hypertensive heart disease without heart failure: Secondary | ICD-10-CM

## 2013-09-01 DIAGNOSIS — I359 Nonrheumatic aortic valve disorder, unspecified: Secondary | ICD-10-CM

## 2013-09-01 DIAGNOSIS — M1990 Unspecified osteoarthritis, unspecified site: Secondary | ICD-10-CM | POA: Diagnosis not present

## 2013-09-01 NOTE — Patient Instructions (Signed)
Your physician recommends that you continue on your current medications as directed. Please refer to the Current Medication list given to you today.  Your physician wants you to follow-up in: 6 month ov You will receive a reminder letter in the mail two months in advance. If you don't receive a letter, please call our office to schedule the follow-up appointment.  

## 2013-09-01 NOTE — Progress Notes (Signed)
Priscilla [name] Date of Birth:  09-07-1930 [facility] [address] [place], [place]  [address] [phone]        Fax   [phone]   History of Present Illness: This pleasant 80 year old woman is seen for a scheduled followup office visit. She has a past history of essential hypertension and a history of mild aortic valve disease with aortic stenosis and aortic insufficiency. Her last echocardiogram in March 2010 showed moderate aortic insufficiency with mild mitral regurgitation and normal LV function. She does not have any history of ischemic heart disease. She has had a previous right total knee replacement by Dr. Telford Nab. She is still having problems with her knees but does not anticipate any further surgery. In November 2014 she was admitted to [facility] with small bowel obstruction and required surgery.  Since last visit she has been doing well.   Current Outpatient Prescriptions  Medication Sig Dispense Refill  . aspirin 81 MG chewable tablet Chew 81 mg by mouth daily.      Marland Kitchen atenolol (TENORMIN) 50 MG tablet Take 1 tablet (50 mg total) by mouth 2 (two) times daily.  180 tablet  0  . Biotin 5 MG TABS Take 1 tablet by mouth daily.      . Calcium Carb-Cholecalciferol (CALCIUM-VITAMIN D) 600-400 MG-UNIT TABS Take 1 tablet by mouth daily.      . clindamycin (CLEOCIN) 150 MG capsule For dental appointments      . losartan-hydrochlorothiazide (HYZAAR) 100-12.5 MG per tablet TAKE 1 TABLET BY MOUTH EVERY DAY  90 tablet  3  . nitroGLYCERIN (NITROSTAT) 0.4 MG SL tablet Place 0.4 mg under the tongue every 5 (five) minutes as needed for chest pain.      . [DISCONTINUED] CALCIUM-VITAMIN D PO Take 1 tablet by mouth 2 (two) times daily.       No current facility-administered medications for this visit.    Allergies  Allergen Reactions  . Morphine And Related Nausea And Vomiting  . Lisinopril Cough    Patient Active Problem List   Diagnosis Date Noted  .  History of valvular heart disease 08/22/2010    Priority: High  . Benign hypertensive heart disease without heart failure 08/22/2010    Priority: High  . Osteoarthritis 08/22/2010    Priority: Medium  . Aortic valve disease 01/25/2013  . LUQ pain 09/09/2012  . Osteopenia 09/09/2012  . Medicare annual wellness visit, subsequent 09/11/2011  . C. difficile colitis 02/18/2011  . SBO (small bowel obstruction) 02/05/2011  . Rectal cancer 12/04/2010  . History of colon cancer 08/22/2010    History  Smoking status  . Never Smoker   Smokeless tobacco  . Never Used    History  Alcohol Use No    Family History  Problem Relation Age of Onset  . Heart disease Mother     CHF, valve problems  . Heart disease Father     due to lupus  . Lupus Father   . Cancer Paternal Grandmother     colon  . Coronary artery disease Brother     s/p CABG  . Coronary artery disease Sister     s/p CABG  . Diabetes Neg Hx     Review of Systems: Constitutional: no fever chills diaphoresis or fatigue or change in weight.  Head and neck: no hearing loss, no epistaxis, no photophobia or visual disturbance. Respiratory: No cough, shortness of breath or wheezing. Cardiovascular: No chest pain peripheral edema, palpitations.  Gastrointestinal: No abdominal distention, no abdominal pain, no change in bowel habits hematochezia or melena. Genitourinary: No dysuria, no frequency, no urgency, no nocturia. Musculoskeletal:No arthralgias, no back pain, no gait disturbance or myalgias. Neurological: No dizziness, no headaches, no numbness, no seizures, no syncope, no weakness, no tremors. Hematologic: No lymphadenopathy, no easy bruising. Psychiatric: No confusion, no hallucinations, no sleep disturbance.    Physical Exam: Filed Vitals:   09/01/13 0834  BP: 154/67  Pulse: 63   the general appearance reveals a well-developed well-nourished woman in no distress.The head and neck exam reveals pupils equal and  reactive.  Extraocular movements are full.  There is no scleral icterus.  The mouth and pharynx are normal.  The neck is supple.  The carotids reveal no bruits.  The jugular venous pressure is normal.  The  thyroid is not enlarged.  There is no lymphadenopathy.  The chest is clear to percussion and auscultation.  There are no rales or rhonchi.  Expansion of the chest is symmetrical.  The precordium is quiet.  The first heart sound is normal.  The second heart sound is physiologically split.  There is soft systolic ejection murmur at the base.  No definite diastolic murmur.  The aortic closure sound is prominent. There is no abnormal lift or heave.  The abdomen is soft and nontender.  The bowel sounds are normal.  The liver and spleen are not enlarged.  There are no abdominal masses.  There are no abdominal bruits.  Extremities reveal good pedal pulses.  There is no phlebitis or edema.  There is no cyanosis or clubbing.  Strength is normal and symmetrical in all extremities.  There is no lateralizing weakness.  There are no sensory deficits.  The skin is warm and dry.  There is no rash.  EKG shows sinus rhythm with first degree AV block and left axis deviation and is unchanged since 08/02/12   Assessment / Plan: 1. benign hypertensive heart disease without heart failure. 2. aortic valve disease with mild aortic insufficiency 3. Osteoarthritis 4. past history of rectal cancer     Plan: Continue same medication.  Recheck in 6 months for office visit.

## 2013-09-01 NOTE — Assessment & Plan Note (Signed)
Blood pressure was high today here in the office typically at home is in the 846 systolic range.  She has a home blood pressure cuff that she uses to monitor.  She's not had any headaches dizziness or syncope.

## 2013-09-01 NOTE — Assessment & Plan Note (Signed)
She remains active working in her home and in her garden.  Her arthritis has been stable

## 2013-09-01 NOTE — Assessment & Plan Note (Signed)
She's not having any symptoms referable to her aortic valve disease.  No evidence of CHF.  No dizziness or syncope.

## 2013-09-03 ENCOUNTER — Other Ambulatory Visit: Payer: Self-pay | Admitting: Family Medicine

## 2013-09-03 DIAGNOSIS — D649 Anemia, unspecified: Secondary | ICD-10-CM

## 2013-09-03 DIAGNOSIS — I119 Hypertensive heart disease without heart failure: Secondary | ICD-10-CM

## 2013-09-03 DIAGNOSIS — M8580 Other specified disorders of bone density and structure, unspecified site: Secondary | ICD-10-CM

## 2013-09-05 ENCOUNTER — Other Ambulatory Visit (INDEPENDENT_AMBULATORY_CARE_PROVIDER_SITE_OTHER): Payer: Medicare Other

## 2013-09-05 DIAGNOSIS — M8580 Other specified disorders of bone density and structure, unspecified site: Secondary | ICD-10-CM

## 2013-09-05 DIAGNOSIS — I119 Hypertensive heart disease without heart failure: Secondary | ICD-10-CM

## 2013-09-05 DIAGNOSIS — M899 Disorder of bone, unspecified: Secondary | ICD-10-CM | POA: Diagnosis not present

## 2013-09-05 DIAGNOSIS — M949 Disorder of cartilage, unspecified: Secondary | ICD-10-CM | POA: Diagnosis not present

## 2013-09-05 DIAGNOSIS — D649 Anemia, unspecified: Secondary | ICD-10-CM

## 2013-09-05 LAB — BASIC METABOLIC PANEL
BUN: 22 mg/dL (ref 6–23)
CALCIUM: 9.3 mg/dL (ref 8.4–10.5)
CO2: 27 mEq/L (ref 19–32)
CREATININE: 1 mg/dL (ref 0.4–1.2)
Chloride: 102 mEq/L (ref 96–112)
GFR: 57.93 mL/min — AB (ref >60.00)
Glucose, Bld: 103 mg/dL — ABNORMAL HIGH (ref 70–99)
Potassium: 4.4 mEq/L (ref 3.5–5.1)
SODIUM: 136 meq/L (ref 135–145)

## 2013-09-05 LAB — CBC WITH DIFFERENTIAL/PLATELET
BASOS PCT: 1 % (ref 0.0–3.0)
Basophils Absolute: 0 10*3/uL (ref 0.0–0.1)
EOS ABS: 0.1 10*3/uL (ref 0.0–0.7)
EOS PCT: 2.6 % (ref 0.0–5.0)
HCT: 32.8 % — ABNORMAL LOW (ref 36.0–46.0)
HEMOGLOBIN: 10.9 g/dL — AB (ref 12.0–15.0)
LYMPHS PCT: 32.5 % (ref 12.0–46.0)
Lymphs Abs: 1.2 10*3/uL (ref 0.7–4.0)
MCHC: 33.2 g/dL (ref 30.0–36.0)
MCV: 87.8 fl (ref 78.0–100.0)
MONOS PCT: 11.8 % (ref 3.0–12.0)
Monocytes Absolute: 0.4 10*3/uL (ref 0.1–1.0)
NEUTROS ABS: 2 10*3/uL (ref 1.4–7.7)
NEUTROS PCT: 52.1 % (ref 43.0–77.0)
Platelets: 297 10*3/uL (ref 150.0–400.0)
RBC: 3.73 Mil/uL — AB (ref 3.87–5.11)
RDW: 13.8 % (ref 11.5–15.5)
WBC: 3.8 10*3/uL — AB (ref 4.0–10.5)

## 2013-09-05 LAB — VITAMIN D 25 HYDROXY (VIT D DEFICIENCY, FRACTURES): VITD: 22.65 ng/mL

## 2013-09-12 ENCOUNTER — Ambulatory Visit (INDEPENDENT_AMBULATORY_CARE_PROVIDER_SITE_OTHER): Payer: Medicare Other | Admitting: Family Medicine

## 2013-09-12 ENCOUNTER — Encounter: Payer: Self-pay | Admitting: Family Medicine

## 2013-09-12 VITALS — BP 158/74 | HR 64 | Temp 98.1°F | Ht 61.0 in | Wt 157.5 lb

## 2013-09-12 DIAGNOSIS — Z85038 Personal history of other malignant neoplasm of large intestine: Secondary | ICD-10-CM

## 2013-09-12 DIAGNOSIS — D649 Anemia, unspecified: Secondary | ICD-10-CM

## 2013-09-12 DIAGNOSIS — Z23 Encounter for immunization: Secondary | ICD-10-CM | POA: Diagnosis not present

## 2013-09-12 DIAGNOSIS — M8580 Other specified disorders of bone density and structure, unspecified site: Secondary | ICD-10-CM

## 2013-09-12 DIAGNOSIS — C20 Malignant neoplasm of rectum: Secondary | ICD-10-CM

## 2013-09-12 DIAGNOSIS — M899 Disorder of bone, unspecified: Secondary | ICD-10-CM

## 2013-09-12 DIAGNOSIS — D539 Nutritional anemia, unspecified: Secondary | ICD-10-CM | POA: Insufficient documentation

## 2013-09-12 DIAGNOSIS — Z0000 Encounter for general adult medical examination without abnormal findings: Secondary | ICD-10-CM | POA: Diagnosis not present

## 2013-09-12 DIAGNOSIS — I119 Hypertensive heart disease without heart failure: Secondary | ICD-10-CM | POA: Diagnosis not present

## 2013-09-12 DIAGNOSIS — M949 Disorder of cartilage, unspecified: Secondary | ICD-10-CM

## 2013-09-12 NOTE — Addendum Note (Signed)
Addended by: Royann Shivers A on: 09/12/2013 09:42 AM   Modules accepted: Orders

## 2013-09-12 NOTE — Patient Instructions (Addendum)
Call your insurance about the shingles shot to see if it is covered or how much it would cost and where is cheaper (here or pharmacy).  If you want to receive here, call for nurse visit. Bring me a copy of your living will. Prevnar today. Return in 6 months for labwork to check on blood counts and kidneys Good to see you today, call us with questions.

## 2013-09-12 NOTE — Assessment & Plan Note (Signed)
I have personally reviewed the Medicare Annual Wellness questionnaire and have noted 1. The patient's medical and social history 2. Their use of alcohol, tobacco or illicit drugs 3. Their current medications and supplements 4. The patient's functional ability including ADL's, fall risks, home safety risks and hearing or visual impairment. 5. Diet and physical activity 6. Evidence for depression or mood disorders The patients weight, height, BMI have been recorded in the chart.  Hearing and vision has been addressed. I have made referrals, counseling and provided education to the patient based review of the above and I have provided the pt with a written personalized care plan for preventive services. See scanned questionairre. Advanced directives discussed: pt will bring me copy  Reviewed preventative protocols and updated unless pt declined. prevnar today.

## 2013-09-12 NOTE — Assessment & Plan Note (Signed)
Recent colonoscopy stable except for erosion at anastomosis.

## 2013-09-12 NOTE — Assessment & Plan Note (Signed)
bp elevated today but pt endorses improved control at home. No changes today.

## 2013-09-12 NOTE — Assessment & Plan Note (Signed)
Reviewed cal/vit D intake. rec increased vit D (extra 1000 IU daily.

## 2013-09-12 NOTE — Assessment & Plan Note (Signed)
Progressively deteriorated over last few years - recent colonoscopy ok. Will ask her to return in 6 mo for recheck along with anemia panel. Pt agrees with plan, denies current evident blood loss. H/o significant portion of bowels removed from cancers

## 2013-09-12 NOTE — Progress Notes (Signed)
Pre visit review using our clinic review tool, if applicable. No additional management support is needed unless otherwise documented below in the visit note. 

## 2013-09-12 NOTE — Progress Notes (Signed)
BP 158/74  Pulse 64  Temp(Src) 98.1 F (36.7 C) (Oral)  Ht 5\' 1"  (1.549 m)  Wt 157 lb 8 oz (71.442 kg)  BMI 29.77 kg/m2   CC: medicare wellness visit  Subjective:    Patient ID: Priscilla [name], female    DOB: [birth year], 80 y.o.   MRN: [id number]  HPI: Priscilla [name] is a 80 y.o. female presenting on 09/12/2013 for Annual Exam   H/o colon and rectal cancer s/p colectomy and small bowel removal for obstruction.  HTN - at home bp runs 262 systolic. bp by me today:  Hearing screen normal today.  Vision screen normal today. Last had eye exam 1 yr ago. Has appt pending. Sees yearly in summer Denies falls in last year.  Denies depression, sadness, anhedonia.   Preventative: Well woman - has aged out of pap smears. Denies pelvic pain or skin changes Mammogram - 11/2012 WNL. Done at [place]. H/o colon and rectal cancer. H/o bowel resection after SBO. Sees Dr Amedeo Plenty GI.  COLONOSCOPY Date: 10 2014 erosions at colonic anastomosis, o/w normal rpt 5 yrs (Dr. Penelope Coop at [facility]) DEXA Date: 09/2011 T-score: spine -2.2, femur -1.6 pneumonia shot 2009  Declines shingles shot. Has had shingles.  Flu - 2014 Td - 08/2011  peumovax - 2009. prevnar today. Advanced directives - has living will at home. HCPOA would be son Clare Gandy) and daughter Lelan Pons), also would be ok with other children helping.  Sunscreen use discussed  Caffeine: decaf  Lives alone, daughter lives nearby to visit. Widow for 16 yrs  Occupation: retired, worked at [employer] office  Edu: HS  Activity: yardwork, walks up and down steps at home  Diet: good water, fruits/vegetables daily. 1 cup milk/day, cheese   Relevant past medical, surgical, family and social history reviewed and updated as indicated.  Allergies and medications reviewed and updated. Current Outpatient Prescriptions on File Prior to Visit  Medication Sig  . aspirin 81 MG chewable tablet Chew 81 mg by mouth daily.  Marland Kitchen atenolol (TENORMIN) 50 MG tablet Take 1 tablet (50 mg  total) by mouth 2 (two) times daily.  . Biotin 5 MG TABS Take 1 tablet by mouth daily.  . Calcium Carb-Cholecalciferol (CALCIUM-VITAMIN D) 600-400 MG-UNIT TABS Take 1 tablet by mouth daily.  . clindamycin (CLEOCIN) 150 MG capsule For dental appointments  . losartan-hydrochlorothiazide (HYZAAR) 100-12.5 MG per tablet TAKE 1 TABLET BY MOUTH EVERY DAY  . nitroGLYCERIN (NITROSTAT) 0.4 MG SL tablet Place 0.4 mg under the tongue every 5 (five) minutes as needed for chest pain.  . [DISCONTINUED] CALCIUM-VITAMIN D PO Take 1 tablet by mouth 2 (two) times daily.   No current facility-administered medications on file prior to visit.    Review of Systems Per HPI unless specifically indicated above    Objective:    BP 158/74  Pulse 64  Temp(Src) 98.1 F (36.7 C) (Oral)  Ht 5\' 1"  (1.549 m)  Wt 157 lb 8 oz (71.442 kg)  BMI 29.77 kg/m2  Physical Exam  Nursing note and vitals reviewed. Constitutional: She is oriented to person, place, and time. She appears well-developed and well-nourished. No distress.  HENT:  Head: Normocephalic and atraumatic.  Right Ear: Hearing, tympanic membrane, external ear and ear canal normal.  Left Ear: Hearing, tympanic membrane, external ear and ear canal normal.  Nose: Nose normal.  Mouth/Throat: Uvula is midline, oropharynx is clear and moist and mucous membranes are normal. No oropharyngeal exudate, posterior oropharyngeal edema or posterior oropharyngeal erythema.  Eyes: Conjunctivae  and EOM are normal. Pupils are equal, round, and reactive to light. No scleral icterus.  Neck: Normal range of motion. Neck supple. Carotid bruit is not present. No thyromegaly present.  Cardiovascular: Normal rate, regular rhythm, normal heart sounds and intact distal pulses.   No murmur heard. Pulses:      Radial pulses are 2+ on the right side, and 2+ on the left side.  Pulmonary/Chest: Effort normal and breath sounds normal. No respiratory distress. She has no wheezes. She has  no rales.  Breast exam - deferred  Abdominal: Soft. Bowel sounds are normal. She exhibits no distension and no mass. There is no tenderness. There is no rebound and no guarding.  Genitourinary:  deferred  Musculoskeletal: Normal range of motion. She exhibits no edema.  Lymphadenopathy:    She has no cervical adenopathy.  Neurological: She is alert and oriented to person, place, and time.  CN grossly intact, station and gait intact Recall 3/3 Calculation 5/5 serial 7s  Skin: Skin is warm and dry. No rash noted.  Psychiatric: She has a normal mood and affect. Her behavior is normal. Judgment and thought content normal.   Results for orders placed in visit on 16/10/96  BASIC METABOLIC PANEL      Result Value Ref Range   Sodium 136  135 - 145 mEq/L   Potassium 4.4  3.5 - 5.1 mEq/L   Chloride 102  96 - 112 mEq/L   CO2 27  19 - 32 mEq/L   Glucose, Bld 103 (*) 70 - 99 mg/dL   BUN 22  6 - 23 mg/dL   Creatinine, Ser 1.0  0.4 - 1.2 mg/dL   Calcium 9.3  8.4 - 10.5 mg/dL   GFR 57.93 (*) >60.00 mL/min  CBC WITH DIFFERENTIAL      Result Value Ref Range   WBC 3.8 (*) 4.0 - 10.5 K/uL   RBC 3.73 (*) 3.87 - 5.11 Mil/uL   Hemoglobin 10.9 (*) 12.0 - 15.0 g/dL   HCT 32.8 (*) 36.0 - 46.0 %   MCV 87.8  78.0 - 100.0 fl   MCHC 33.2  30.0 - 36.0 g/dL   RDW 13.8  11.5 - 15.5 %   Platelets 297.0  150.0 - 400.0 K/uL   Neutrophils Relative % 52.1  43.0 - 77.0 %   Lymphocytes Relative 32.5  12.0 - 46.0 %   Monocytes Relative 11.8  3.0 - 12.0 %   Eosinophils Relative 2.6  0.0 - 5.0 %   Basophils Relative 1.0  0.0 - 3.0 %   Neutro Abs 2.0  1.4 - 7.7 K/uL   Lymphs Abs 1.2  0.7 - 4.0 K/uL   Monocytes Absolute 0.4  0.1 - 1.0 K/uL   Eosinophils Absolute 0.1  0.0 - 0.7 K/uL   Basophils Absolute 0.0  0.0 - 0.1 K/uL  VITAMIN D 25 HYDROXY      Result Value Ref Range   VITD 22.65        Assessment & Plan:   Problem List Items Addressed This Visit   Rectal cancer   Osteopenia     Reviewed cal/vit D  intake. rec increased vit D (extra 1000 IU daily.    Medicare annual wellness visit, subsequent - Primary     I have personally reviewed the Medicare Annual Wellness questionnaire and have noted 1. The patient's medical and social history 2. Their use of alcohol, tobacco or illicit drugs 3. Their current medications and supplements 4. The patient's functional ability  including ADL's, fall risks, home safety risks and hearing or visual impairment. 5. Diet and physical activity 6. Evidence for depression or mood disorders The patients weight, height, BMI have been recorded in the chart.  Hearing and vision has been addressed. I have made referrals, counseling and provided education to the patient based review of the above and I have provided the pt with a written personalized care plan for preventive services. See scanned questionairre. Advanced directives discussed: pt will bring me copy  Reviewed preventative protocols and updated unless pt declined. prevnar today.    History of colon cancer     Recent colonoscopy stable except for erosion at anastomosis.    Benign hypertensive heart disease without heart failure     bp elevated today but pt endorses improved control at home. No changes today.    Anemia     Progressively deteriorated over last few years - recent colonoscopy ok. Will ask her to return in 6 mo for recheck along with anemia panel. Pt agrees with plan, denies current evident blood loss. H/o significant portion of bowels removed from cancers        Follow up plan: Return in about 1 year (around 09/13/2014), or as needed, for annual exam, prior fasting for blood work.

## 2013-11-15 DIAGNOSIS — M461 Sacroiliitis, not elsewhere classified: Secondary | ICD-10-CM | POA: Diagnosis not present

## 2013-11-21 ENCOUNTER — Other Ambulatory Visit: Payer: Self-pay | Admitting: Cardiology

## 2013-11-21 ENCOUNTER — Other Ambulatory Visit: Payer: Self-pay | Admitting: Family Medicine

## 2013-11-30 DIAGNOSIS — M461 Sacroiliitis, not elsewhere classified: Secondary | ICD-10-CM | POA: Diagnosis not present

## 2013-12-06 DIAGNOSIS — M461 Sacroiliitis, not elsewhere classified: Secondary | ICD-10-CM | POA: Diagnosis not present

## 2013-12-13 DIAGNOSIS — Z1231 Encounter for screening mammogram for malignant neoplasm of breast: Secondary | ICD-10-CM | POA: Diagnosis not present

## 2013-12-14 ENCOUNTER — Encounter: Payer: Self-pay | Admitting: Family Medicine

## 2013-12-15 ENCOUNTER — Encounter: Payer: Self-pay | Admitting: Family Medicine

## 2014-01-18 DIAGNOSIS — H2513 Age-related nuclear cataract, bilateral: Secondary | ICD-10-CM | POA: Diagnosis not present

## 2014-03-01 ENCOUNTER — Ambulatory Visit (INDEPENDENT_AMBULATORY_CARE_PROVIDER_SITE_OTHER): Payer: Medicare Other | Admitting: Cardiology

## 2014-03-01 ENCOUNTER — Encounter: Payer: Self-pay | Admitting: Cardiology

## 2014-03-01 VITALS — BP 170/60 | HR 69 | Ht 61.0 in | Wt 158.0 lb

## 2014-03-01 DIAGNOSIS — I359 Nonrheumatic aortic valve disorder, unspecified: Secondary | ICD-10-CM

## 2014-03-01 DIAGNOSIS — I119 Hypertensive heart disease without heart failure: Secondary | ICD-10-CM | POA: Diagnosis not present

## 2014-03-01 DIAGNOSIS — M159 Polyosteoarthritis, unspecified: Secondary | ICD-10-CM

## 2014-03-01 NOTE — Progress Notes (Signed)
Priscilla [name] Date of Birth:  02-May-1930 [facility] [address] [place], [place]  [address] [phone]        Fax   [phone]   History of Present Illness: This pleasant 80 year old woman is seen for a scheduled followup office visit. She has a past history of essential hypertension and a history of mild aortic valve disease with aortic stenosis and aortic insufficiency. Her last echocardiogram in March 2010 showed moderate aortic insufficiency with mild mitral regurgitation and normal LV function. She does not have any history of ischemic heart disease. She has had a previous right total knee replacement by Dr. Telford Nab. She is still having problems with her knees but does not anticipate any further surgery. In November 2014 she was admitted to [facility] with small bowel obstruction and required surgery.  Since last visit she has been doing well.  One morning she had severe chest pain which woke her up.  She took a single nitroglycerin and had relief within 5 minutes.  She has had no further difficulty.  For the past several months she has been having some intermittent neuropathic pain involving just the tip of the right third toe.   Current Outpatient Prescriptions  Medication Sig Dispense Refill  . aspirin 81 MG chewable tablet Chew 81 mg by mouth daily.    Marland Kitchen atenolol (TENORMIN) 50 MG tablet TAKE 1 TABLET BY MOUTH 2 TIMES DAILY. 180 tablet 2  . Biotin 5 MG TABS Take 1 tablet by mouth daily.    . Calcium Carb-Cholecalciferol (CALCIUM-VITAMIN D) 600-400 MG-UNIT TABS Take 1 tablet by mouth daily.    . cholecalciferol (VITAMIN D) 1000 UNITS tablet Take 1,000 Units by mouth daily.    . clindamycin (CLEOCIN) 150 MG capsule For dental appointments    . losartan-hydrochlorothiazide (HYZAAR) 100-12.5 MG per tablet TAKE 1 TABLET BY MOUTH EVERY DAY 90 tablet 3  . nitroGLYCERIN (NITROSTAT) 0.4 MG SL tablet Place 0.4 mg under the tongue every 5 (five) minutes as  needed for chest pain.    . [DISCONTINUED] CALCIUM-VITAMIN D PO Take 1 tablet by mouth 2 (two) times daily.     No current facility-administered medications for this visit.    Allergies  Allergen Reactions  . Morphine And Related Nausea And Vomiting  . Lisinopril Cough    Patient Active Problem List   Diagnosis Date Noted  . History of valvular heart disease 08/22/2010    Priority: High  . Benign hypertensive heart disease without heart failure 08/22/2010    Priority: High  . Osteoarthritis 08/22/2010    Priority: Medium  . Anemia 09/12/2013  . Aortic valve disease 01/25/2013  . LUQ pain 09/09/2012  . Osteopenia 09/09/2012  . Medicare annual wellness visit, subsequent 09/11/2011  . SBO (small bowel obstruction) 02/05/2011  . Rectal cancer 12/04/2010  . History of colon cancer 08/22/2010    History  Smoking status  . Never Smoker   Smokeless tobacco  . Never Used    History  Alcohol Use No    Family History  Problem Relation Age of Onset  . Heart disease Mother     CHF, valve problems  . Heart disease Father     due to lupus  . Lupus Father   . Cancer Paternal Grandmother     colon  . Coronary artery disease Brother     s/p CABG  . Coronary artery disease Sister     s/p CABG  . Diabetes Neg  Hx     Review of Systems: Constitutional: no fever chills diaphoresis or fatigue or change in weight.  Head and neck: no hearing loss, no epistaxis, no photophobia or visual disturbance. Respiratory: No cough, shortness of breath or wheezing. Cardiovascular: No chest pain peripheral edema, palpitations. Gastrointestinal: No abdominal distention, no abdominal pain, no change in bowel habits hematochezia or melena. Genitourinary: No dysuria, no frequency, no urgency, no nocturia. Musculoskeletal:No arthralgias, no back pain, no gait disturbance or myalgias. Neurological: No dizziness, no headaches, no numbness, no seizures, no syncope, no weakness, no  tremors. Hematologic: No lymphadenopathy, no easy bruising. Psychiatric: No confusion, no hallucinations, no sleep disturbance.    Physical Exam: Filed Vitals:   03/01/14 1443  BP: 170/60  Pulse: 69   the general appearance reveals a well-developed well-nourished woman in no distress.The head and neck exam reveals pupils equal and reactive.  Extraocular movements are full.  There is no scleral icterus.  The mouth and pharynx are normal.  The neck is supple.  The carotids reveal no bruits.  The jugular venous pressure is normal.  The  thyroid is not enlarged.  There is no lymphadenopathy.  The chest is clear to percussion and auscultation.  There are no rales or rhonchi.  Expansion of the chest is symmetrical.  The precordium is quiet.  The first heart sound is normal.  The second heart sound is physiologically split.  There is soft systolic ejection murmur at the base.  No definite diastolic murmur.  The aortic closure sound is prominent. There is no abnormal lift or heave.  The abdomen is soft and nontender.  The bowel sounds are normal.  The liver and spleen are not enlarged.  There are no abdominal masses.  There are no abdominal bruits.  Extremities reveal good pedal pulses.  There is no phlebitis or edema.  There is no cyanosis or clubbing.  Strength is normal and symmetrical in all extremities.  There is no lateralizing weakness.  There are no sensory deficits.  The skin is warm and dry.  There is no rash.  EKG shows sinus rhythm with first degree AV block and left axis deviation and is unchanged since 08/02/12   Assessment / Plan: 1. benign hypertensive heart disease without heart failure.  White coat hypertension plays a role 2. aortic valve disease with mild aortic insufficiency 3. Osteoarthritis 4. past history of rectal cancer.  No evidence of recurrence.     Plan: Continue same medication.  Recheck in 6 months for office visit and EKG

## 2014-03-01 NOTE — Assessment & Plan Note (Signed)
She has osteoarthritis of multiple joints.  She had recent flareup of sacroiliac osteoarthritis and received a injection from her orthopedist which helped.

## 2014-03-01 NOTE — Patient Instructions (Signed)
Your physician recommends that you continue on your current medications as directed. Please refer to the Current Medication list given to you today.  Your physician wants you to follow-up in: 6 month ov/ekg You will receive a reminder letter in the mail two months in advance. If you don't receive a letter, please call our office to schedule the follow-up appointment.  

## 2014-03-01 NOTE — Assessment & Plan Note (Signed)
She is not having any symptoms of congestive heart failure.

## 2014-03-01 NOTE — Assessment & Plan Note (Signed)
Blood pressure here in the office was elevated today.  She checks her blood pressure frequently at home and it is always normal.  She does have some degree of whitecoat syndrome.  I rechecked her blood pressure myself later in the examiner was down to 154/70.

## 2014-03-12 ENCOUNTER — Other Ambulatory Visit: Payer: Self-pay | Admitting: Family Medicine

## 2014-03-12 DIAGNOSIS — E559 Vitamin D deficiency, unspecified: Secondary | ICD-10-CM | POA: Insufficient documentation

## 2014-03-12 DIAGNOSIS — D72819 Decreased white blood cell count, unspecified: Secondary | ICD-10-CM

## 2014-03-12 DIAGNOSIS — I119 Hypertensive heart disease without heart failure: Secondary | ICD-10-CM

## 2014-03-12 DIAGNOSIS — C20 Malignant neoplasm of rectum: Secondary | ICD-10-CM

## 2014-03-12 DIAGNOSIS — D649 Anemia, unspecified: Secondary | ICD-10-CM

## 2014-03-14 ENCOUNTER — Other Ambulatory Visit (INDEPENDENT_AMBULATORY_CARE_PROVIDER_SITE_OTHER): Payer: Medicare Other

## 2014-03-14 DIAGNOSIS — E559 Vitamin D deficiency, unspecified: Secondary | ICD-10-CM

## 2014-03-14 DIAGNOSIS — I119 Hypertensive heart disease without heart failure: Secondary | ICD-10-CM

## 2014-03-14 DIAGNOSIS — D649 Anemia, unspecified: Secondary | ICD-10-CM | POA: Diagnosis not present

## 2014-03-14 DIAGNOSIS — D72819 Decreased white blood cell count, unspecified: Secondary | ICD-10-CM

## 2014-03-14 LAB — RENAL FUNCTION PANEL
Albumin: 3.8 g/dL (ref 3.5–5.2)
BUN: 16 mg/dL (ref 6–23)
CO2: 26 meq/L (ref 19–32)
Calcium: 9.5 mg/dL (ref 8.4–10.5)
Chloride: 103 mEq/L (ref 96–112)
Creatinine, Ser: 0.9 mg/dL (ref 0.4–1.2)
GFR: 62.23 mL/min (ref >60.00)
GLUCOSE: 94 mg/dL (ref 70–99)
PHOSPHORUS: 3.5 mg/dL (ref 2.3–4.6)
Potassium: 4.4 mEq/L (ref 3.5–5.1)
SODIUM: 136 meq/L (ref 135–145)

## 2014-03-14 LAB — CBC WITH DIFFERENTIAL/PLATELET
BASOS ABS: 0.1 10*3/uL (ref 0.0–0.1)
Basophils Relative: 1.4 % (ref 0.0–3.0)
Eosinophils Absolute: 0.1 10*3/uL (ref 0.0–0.7)
Eosinophils Relative: 2.8 % (ref 0.0–5.0)
HCT: 31.1 % — ABNORMAL LOW (ref 36.0–46.0)
HEMOGLOBIN: 10.1 g/dL — AB (ref 12.0–15.0)
LYMPHS PCT: 28.4 % (ref 12.0–46.0)
Lymphs Abs: 1.2 10*3/uL (ref 0.7–4.0)
MCHC: 32.4 g/dL (ref 30.0–36.0)
MCV: 85.2 fl (ref 78.0–100.0)
MONOS PCT: 12.1 % — AB (ref 3.0–12.0)
Monocytes Absolute: 0.5 10*3/uL (ref 0.1–1.0)
NEUTROS ABS: 2.3 10*3/uL (ref 1.4–7.7)
Neutrophils Relative %: 55.3 % (ref 43.0–77.0)
Platelets: 317 10*3/uL (ref 150.0–400.0)
RBC: 3.65 Mil/uL — ABNORMAL LOW (ref 3.87–5.11)
RDW: 13.7 % (ref 11.5–15.5)
WBC: 4.2 10*3/uL (ref 4.0–10.5)

## 2014-03-14 LAB — VITAMIN D 25 HYDROXY (VIT D DEFICIENCY, FRACTURES): VITD: 21.01 ng/mL — AB (ref 30.00–100.00)

## 2014-03-14 LAB — IBC PANEL
Iron: 55 ug/dL (ref 42–145)
Saturation Ratios: 12.3 % — ABNORMAL LOW (ref 20.0–50.0)
TRANSFERRIN: 319.7 mg/dL (ref 212.0–360.0)

## 2014-03-14 LAB — FOLATE: Folate: >24.6 ng/mL (ref >5.9)

## 2014-03-14 LAB — VITAMIN B12: Vitamin B-12: 102 pg/mL — ABNORMAL LOW (ref 211–911)

## 2014-03-14 LAB — FERRITIN: FERRITIN: 7.7 ng/mL — AB (ref 10.0–291.0)

## 2014-03-15 LAB — PATHOLOGIST SMEAR REVIEW

## 2014-03-15 LAB — RETICULOCYTES
ABS Retic: 36.2 10*3/uL (ref 19.0–186.0)
RBC.: 3.62 MIL/uL — AB (ref 3.87–5.11)
Retic Ct Pct: 1 % (ref 0.4–2.3)

## 2014-03-25 ENCOUNTER — Encounter: Payer: Self-pay | Admitting: Family Medicine

## 2014-03-25 ENCOUNTER — Other Ambulatory Visit: Payer: Self-pay | Admitting: Family Medicine

## 2014-03-25 DIAGNOSIS — E538 Deficiency of other specified B group vitamins: Secondary | ICD-10-CM

## 2014-03-25 HISTORY — DX: Deficiency of other specified B group vitamins: E53.8

## 2014-03-25 MED ORDER — FERROUS SULFATE 325 (65 FE) MG PO TABS: 325.0000 mg | ORAL_TABLET | Freq: Every day | ORAL | Status: DC

## 2014-03-28 ENCOUNTER — Other Ambulatory Visit: Payer: Self-pay | Admitting: *Deleted

## 2014-04-05 ENCOUNTER — Ambulatory Visit (INDEPENDENT_AMBULATORY_CARE_PROVIDER_SITE_OTHER): Payer: Medicare Other

## 2014-04-05 ENCOUNTER — Telehealth: Payer: Self-pay

## 2014-04-05 ENCOUNTER — Ambulatory Visit: Payer: Medicare Other

## 2014-04-05 DIAGNOSIS — E538 Deficiency of other specified B group vitamins: Secondary | ICD-10-CM

## 2014-04-05 MED ORDER — CYANOCOBALAMIN 1000 MCG/ML IJ SOLN
1000.0000 ug | Freq: Once | INTRAMUSCULAR | Status: AC
  Administered 2014-04-05: 1000 ug via INTRAMUSCULAR

## 2014-04-05 NOTE — Telephone Encounter (Signed)
Patient notified and verbalized understanding. She said she has been mistakenly taking the iron BID. She is not taking it tonight or tomorrow and will follow the BRAT diet. She will call back tomorrow if no better.

## 2014-04-05 NOTE — Telephone Encounter (Signed)
Iron usually causes constipation, would recommend hold iron for now and if no improvement to come in for eval tomorrow or if pt prefers to come in today (coud add on at 12:30pm if needed). Recommend bland diet - BRAT diet today.

## 2014-04-05 NOTE — Telephone Encounter (Signed)
Pt started iron 325 mg bid about one week ago; 04/03/14 pt developed diarrhea, watery at times, no mucus or blood. Stools are black since starting iron, advised pt that is normal after taking iron tab. Pt has no abdominal pain or fever. In last 24 hrs had diarrhea x 6. Last diarrhea was last night. [facility]. Pt request cb.

## 2014-04-05 NOTE — Telephone Encounter (Signed)
Attempted to call numerous times. Line was busy. Will try again later.

## 2014-05-09 ENCOUNTER — Ambulatory Visit (INDEPENDENT_AMBULATORY_CARE_PROVIDER_SITE_OTHER): Payer: Medicare Other | Admitting: *Deleted

## 2014-05-09 DIAGNOSIS — Z23 Encounter for immunization: Secondary | ICD-10-CM | POA: Diagnosis not present

## 2014-05-09 DIAGNOSIS — D519 Vitamin B12 deficiency anemia, unspecified: Secondary | ICD-10-CM

## 2014-05-09 MED ORDER — CYANOCOBALAMIN 1000 MCG/ML IJ SOLN
1000.0000 ug | Freq: Once | INTRAMUSCULAR | Status: AC
  Administered 2014-05-09: 1000 ug via INTRAMUSCULAR

## 2014-06-08 ENCOUNTER — Ambulatory Visit: Payer: Medicare Other

## 2014-06-13 ENCOUNTER — Ambulatory Visit (INDEPENDENT_AMBULATORY_CARE_PROVIDER_SITE_OTHER): Payer: Medicare Other | Admitting: *Deleted

## 2014-06-13 DIAGNOSIS — D519 Vitamin B12 deficiency anemia, unspecified: Secondary | ICD-10-CM | POA: Diagnosis not present

## 2014-06-13 MED ORDER — CYANOCOBALAMIN 1000 MCG/ML IJ SOLN
1000.0000 ug | Freq: Once | INTRAMUSCULAR | Status: AC
  Administered 2014-06-13: 1000 ug via INTRAMUSCULAR

## 2014-07-06 ENCOUNTER — Ambulatory Visit (INDEPENDENT_AMBULATORY_CARE_PROVIDER_SITE_OTHER): Payer: Medicare Other | Admitting: Podiatry

## 2014-07-06 ENCOUNTER — Ambulatory Visit (INDEPENDENT_AMBULATORY_CARE_PROVIDER_SITE_OTHER): Payer: Medicare Other

## 2014-07-06 ENCOUNTER — Encounter: Payer: Self-pay | Admitting: Podiatry

## 2014-07-06 VITALS — BP 156/70 | HR 60 | Resp 12

## 2014-07-06 DIAGNOSIS — M2040 Other hammer toe(s) (acquired), unspecified foot: Secondary | ICD-10-CM

## 2014-07-06 DIAGNOSIS — M779 Enthesopathy, unspecified: Secondary | ICD-10-CM | POA: Diagnosis not present

## 2014-07-06 DIAGNOSIS — L84 Corns and callosities: Secondary | ICD-10-CM

## 2014-07-06 MED ORDER — TRIAMCINOLONE ACETONIDE 10 MG/ML IJ SUSP
10.0000 mg | Freq: Once | INTRAMUSCULAR | Status: AC
  Administered 2014-07-06: 10 mg

## 2014-07-06 NOTE — Progress Notes (Signed)
   Subjective:    Patient ID: Priscilla [name], female    DOB: 04-Oct-1930, 81 y.o.   MRN: [id number]  HPI  PT STATED RT FOOT 2ND TOE IS BEEN PAINFUL FOR 6 MONTHS. THE TOE IS GETTING WORSE ESPECIALLY AT NIGHT HAVE TINGLING SENSATION. TRIED WARM WATER IT HELP SOME.  ALSO, RT BALL OF THE FOOT HAVE CALLUS.  Review of Systems  Gastrointestinal: Positive for diarrhea.       Objective:   Physical Exam        Assessment & Plan:

## 2014-07-06 NOTE — Progress Notes (Signed)
Subjective:     Patient ID: Priscilla [name], female   DOB: 11/29/1930, 81 y.o.   MRN: [id number]  HPI patient points to the third toe right states it's been bothering her a lot and she feels like it's in the joint. States it's been bothering her for a few months and is worsened over the last month or 2 and makes gradually walking more difficult. Patient turned about the progression and the night pain   Review of Systems  All other systems reviewed and are negative.      Objective:   Physical Exam  Constitutional: She is oriented to person, place, and time.  Cardiovascular: Intact distal pulses.   Musculoskeletal: Normal range of motion.  Neurological: She is oriented to person, place, and time.  Skin: Skin is warm.  Nursing note and vitals reviewed.  neurovascular status intact muscle strength adequate range of motion within normal limits. Patient's noted to have inflammation third metatarsophalangeal joint right with fluid buildup and mild contracture of the lesser digits. Patient does have discomfort when I pressed on the area     Assessment:     Probable inflammatory capsule  third MPJ right    Plan:     H&P and x-rays reviewed. Explained condition and today did proximal nerve block aspirated the joint was able to get out a small amount of serosanguineous fluid and then injected with a quarter cc deck for some Kenalog and applied padding. Reappoint to reevaluate results in 3 weeks

## 2014-07-14 ENCOUNTER — Ambulatory Visit (INDEPENDENT_AMBULATORY_CARE_PROVIDER_SITE_OTHER): Payer: Medicare Other

## 2014-07-14 DIAGNOSIS — E538 Deficiency of other specified B group vitamins: Secondary | ICD-10-CM | POA: Diagnosis not present

## 2014-07-14 MED ORDER — CYANOCOBALAMIN 1000 MCG/ML IJ SOLN
1000.0000 ug | Freq: Once | INTRAMUSCULAR | Status: AC
Start: 2014-07-14 — End: 2014-07-14
  Administered 2014-07-14: 1000 ug via INTRAMUSCULAR

## 2014-07-20 ENCOUNTER — Ambulatory Visit (INDEPENDENT_AMBULATORY_CARE_PROVIDER_SITE_OTHER): Payer: Medicare Other | Admitting: Podiatry

## 2014-07-20 ENCOUNTER — Encounter: Payer: Self-pay | Admitting: Podiatry

## 2014-07-20 VITALS — BP 157/70 | HR 67 | Resp 12

## 2014-07-20 DIAGNOSIS — M779 Enthesopathy, unspecified: Secondary | ICD-10-CM

## 2014-07-20 DIAGNOSIS — M2040 Other hammer toe(s) (acquired), unspecified foot: Secondary | ICD-10-CM | POA: Diagnosis not present

## 2014-07-20 NOTE — Progress Notes (Signed)
Subjective:     Patient ID: Priscilla [name], female   DOB: [birth year], 81 y.o.   MRN: [id number]  HPI patient states my right foot is doing quite a bit better with a diminishment of discomfort upon palpation   Review of Systems     Objective:   Physical Exam Neurovascular status intact with muscle strength adequate and significant diminishment of discomfort palpation lesser MPJ right foot with diminished fluid buildup noted    Assessment:     Inflammatory capsulitis right reduced    Plan:     Reviewed condition and advised on physical therapy and padding therapy. Patient will use rigid bottom shoes and will be seen back as needed

## 2014-08-17 ENCOUNTER — Ambulatory Visit (INDEPENDENT_AMBULATORY_CARE_PROVIDER_SITE_OTHER): Payer: Medicare Other | Admitting: *Deleted

## 2014-08-17 DIAGNOSIS — E538 Deficiency of other specified B group vitamins: Secondary | ICD-10-CM

## 2014-08-17 MED ORDER — CYANOCOBALAMIN 1000 MCG/ML IJ SOLN
1000.0000 ug | Freq: Once | INTRAMUSCULAR | Status: AC
  Administered 2014-08-17: 1000 ug via INTRAMUSCULAR

## 2014-08-21 ENCOUNTER — Other Ambulatory Visit: Payer: Self-pay | Admitting: Cardiology

## 2014-09-12 ENCOUNTER — Other Ambulatory Visit (INDEPENDENT_AMBULATORY_CARE_PROVIDER_SITE_OTHER): Payer: Medicare Other

## 2014-09-12 ENCOUNTER — Other Ambulatory Visit: Payer: Self-pay | Admitting: Family Medicine

## 2014-09-12 DIAGNOSIS — I119 Hypertensive heart disease without heart failure: Secondary | ICD-10-CM

## 2014-09-12 DIAGNOSIS — E538 Deficiency of other specified B group vitamins: Secondary | ICD-10-CM

## 2014-09-12 DIAGNOSIS — D509 Iron deficiency anemia, unspecified: Secondary | ICD-10-CM | POA: Diagnosis not present

## 2014-09-12 DIAGNOSIS — E559 Vitamin D deficiency, unspecified: Secondary | ICD-10-CM | POA: Diagnosis not present

## 2014-09-12 LAB — BASIC METABOLIC PANEL
BUN: 19 mg/dL (ref 6–23)
CO2: 26 meq/L (ref 19–32)
Calcium: 9.9 mg/dL (ref 8.4–10.5)
Chloride: 101 mEq/L (ref 96–112)
Creatinine, Ser: 1.07 mg/dL (ref 0.40–1.20)
GFR: 52.21 mL/min — ABNORMAL LOW (ref >60.00)
GLUCOSE: 95 mg/dL (ref 70–99)
Potassium: 4.4 mEq/L (ref 3.5–5.1)
SODIUM: 135 meq/L (ref 135–145)

## 2014-09-12 LAB — CBC WITH DIFFERENTIAL/PLATELET
Basophils Absolute: 0 10*3/uL (ref 0.0–0.1)
Basophils Relative: 0.9 % (ref 0.0–3.0)
Eosinophils Absolute: 0.1 10*3/uL (ref 0.0–0.7)
Eosinophils Relative: 2 % (ref 0.0–5.0)
HEMATOCRIT: 35.2 % — AB (ref 36.0–46.0)
HEMOGLOBIN: 11.9 g/dL — AB (ref 12.0–15.0)
Lymphocytes Relative: 29.8 % (ref 12.0–46.0)
Lymphs Abs: 1.5 10*3/uL (ref 0.7–4.0)
MCHC: 33.9 g/dL (ref 30.0–36.0)
MCV: 91.7 fl (ref 78.0–100.0)
MONO ABS: 0.6 10*3/uL (ref 0.1–1.0)
Monocytes Relative: 12.8 % — ABNORMAL HIGH (ref 3.0–12.0)
NEUTROS ABS: 2.7 10*3/uL (ref 1.4–7.7)
Neutrophils Relative %: 54.5 % (ref 43.0–77.0)
Platelets: 284 10*3/uL (ref 150.0–400.0)
RBC: 3.84 Mil/uL — AB (ref 3.87–5.11)
RDW: 12.8 % (ref 11.5–15.5)
WBC: 4.9 10*3/uL (ref 4.0–10.5)

## 2014-09-12 LAB — LIPID PANEL
CHOL/HDL RATIO: 2
CHOLESTEROL: 171 mg/dL (ref 0–200)
HDL: 70.7 mg/dL (ref >39.00)
LDL CALC: 73 mg/dL (ref 0–99)
NONHDL: 100.3
Triglycerides: 137 mg/dL (ref 0.0–149.0)
VLDL: 27.4 mg/dL (ref 0.0–40.0)

## 2014-09-12 LAB — IBC PANEL
Iron: 55 ug/dL (ref 42–145)
SATURATION RATIOS: 14.2 % — AB (ref 20.0–50.0)
Transferrin: 276 mg/dL (ref 212.0–360.0)

## 2014-09-12 LAB — VITAMIN B12: Vitamin B-12: 286 pg/mL (ref 211–911)

## 2014-09-12 LAB — VITAMIN D 25 HYDROXY (VIT D DEFICIENCY, FRACTURES): VITD: 27.12 ng/mL — ABNORMAL LOW (ref 30.00–100.00)

## 2014-09-12 LAB — FOLATE

## 2014-09-12 LAB — FERRITIN: FERRITIN: 40.3 ng/mL (ref 10.0–291.0)

## 2014-09-18 ENCOUNTER — Encounter: Payer: Medicare Other | Admitting: Family Medicine

## 2014-09-19 ENCOUNTER — Ambulatory Visit: Payer: Medicare Other | Admitting: Cardiology

## 2014-09-19 ENCOUNTER — Ambulatory Visit (INDEPENDENT_AMBULATORY_CARE_PROVIDER_SITE_OTHER): Payer: Medicare Other | Admitting: Family Medicine

## 2014-09-19 ENCOUNTER — Encounter: Payer: Self-pay | Admitting: Family Medicine

## 2014-09-19 VITALS — BP 150/64 | HR 64 | Temp 98.6°F | Ht 61.0 in | Wt 157.2 lb

## 2014-09-19 DIAGNOSIS — Z85038 Personal history of other malignant neoplasm of large intestine: Secondary | ICD-10-CM

## 2014-09-19 DIAGNOSIS — Z0000 Encounter for general adult medical examination without abnormal findings: Secondary | ICD-10-CM | POA: Diagnosis not present

## 2014-09-19 DIAGNOSIS — Z7189 Other specified counseling: Secondary | ICD-10-CM | POA: Insufficient documentation

## 2014-09-19 DIAGNOSIS — D508 Other iron deficiency anemias: Secondary | ICD-10-CM | POA: Diagnosis not present

## 2014-09-19 DIAGNOSIS — I119 Hypertensive heart disease without heart failure: Secondary | ICD-10-CM

## 2014-09-19 DIAGNOSIS — Z85048 Personal history of other malignant neoplasm of rectum, rectosigmoid junction, and anus: Secondary | ICD-10-CM

## 2014-09-19 DIAGNOSIS — M8580 Other specified disorders of bone density and structure, unspecified site: Secondary | ICD-10-CM | POA: Diagnosis not present

## 2014-09-19 DIAGNOSIS — M159 Polyosteoarthritis, unspecified: Secondary | ICD-10-CM

## 2014-09-19 DIAGNOSIS — E538 Deficiency of other specified B group vitamins: Secondary | ICD-10-CM | POA: Diagnosis not present

## 2014-09-19 DIAGNOSIS — E559 Vitamin D deficiency, unspecified: Secondary | ICD-10-CM | POA: Diagnosis not present

## 2014-09-19 DIAGNOSIS — Z8679 Personal history of other diseases of the circulatory system: Secondary | ICD-10-CM

## 2014-09-19 DIAGNOSIS — M150 Primary generalized (osteo)arthritis: Secondary | ICD-10-CM

## 2014-09-19 MED ORDER — CYANOCOBALAMIN 1000 MCG/ML IJ SOLN
1000.0000 ug | Freq: Once | INTRAMUSCULAR | Status: AC
  Administered 2014-09-19: 1000 ug via INTRAMUSCULAR

## 2014-09-19 MED ORDER — CYANOCOBALAMIN 1000 MCG/ML IJ SOLN: 1000.0000 ug | Freq: Once | INTRAMUSCULAR | Status: DC

## 2014-09-19 NOTE — Assessment & Plan Note (Signed)
Continue vit D 2000 IU daily.  

## 2014-09-19 NOTE — Assessment & Plan Note (Signed)
bp again elevated today, but endorses better control at home. Given age, no changes indicated currently.

## 2014-09-19 NOTE — Assessment & Plan Note (Signed)
B12 shot today. 

## 2014-09-19 NOTE — Patient Instructions (Addendum)
B12 shot today. Continue monitoring blood pressure at home and let us know if consistently >150/90. Bring me copy of your living will/advanced directive to update your chart. Return as needed or in 1 year for next medicare wellness visit. Good to see you today, call us with questions.

## 2014-09-19 NOTE — Assessment & Plan Note (Signed)
Recent colonoscopy stable.

## 2014-09-19 NOTE — Assessment & Plan Note (Signed)
Murmur not appreciated today.  

## 2014-09-19 NOTE — Progress Notes (Signed)
BP 150/64 mmHg  Pulse 64  Temp(Src) 98.6 F (37 C) (Oral)  Ht 5\' 1"  (1.549 m)  Wt 157 lb 4 oz (71.328 kg)  BMI 29.73 kg/m2   CC: medicare wellness visit  Subjective:    Patient ID: Priscilla [name], female    DOB: [birth year], 81 y.o.   MRN: [id number]  HPI: Priscilla [name] is a 81 y.o. female presenting on 09/19/2014 for Annual Exam   HTN - at home bp runs <338 systolic. BP by me today: 190/70. Pt complaint with meds.  L SIJ injection by orthopedist - since last visit here. Also saw podiatrist s/p aspiration of toe capsulitis.  B12 shots - due for this. Gets monthly. Started 03/2014. Complaint with iron as well.  Occasional LLQ pain without bowel changes, fevers.   Hearing screen normal today.  Vision screen with eye doctor.  Denies falls in last year.  Denies depression, sadness, anhedonia.   Preventative: Well woman - has aged out of pap smears. Denies pelvic pain or skin changes Mammogram - 11/2013 WNL. Done at [place]. H/o colon and rectal cancer. H/o bowel resection after SBO. Sees Dr Amedeo Plenty GI.  COLONOSCOPY Date: 12/2012 erosions at colonic anastomosis, o/w normal rpt 5 yrs (Dr. Penelope Coop at [place]) DEXA Date: 09/2011 T-score: spine -2.2, femur -1.6 Declines shingles shot. Has had shingles.  Flu - fall 2015 Td - 08/2011  peumovax - 2009. prevnar 08/2013 zostavax 04/2014 Advanced directives - has living will at home. HCPOA would be son Priscilla [name]) and daughter Priscilla [name]), also would be ok with other children helping. Ok with CPR, intubation and limited trial for reversible conditions, does not want prolonged life support if terminal condition. Sunscreen use discussed. Seat belt use discussed  Caffeine: decaf  Lives alone, daughter lives nearby to visit. Widow for 16 yrs  Occupation: retired, worked at [employer] office  Edu: HS  Activity: yardwork, walks up and down steps at home  Diet: good water, fruits/vegetables daily. 1 cup milk/day, cheese   Relevant past medical,  surgical, family and social history reviewed and updated as indicated. Interim medical history since our last visit reviewed. Allergies and medications reviewed and updated. Current Outpatient Prescriptions on File Prior to Visit  Medication Sig  . aspirin 81 MG chewable tablet Chew 81 mg by mouth daily.  Marland Kitchen atenolol (TENORMIN) 50 MG tablet TAKE 1 TABLET BY MOUTH 2 TIMES DAILY.  . Calcium Carb-Cholecalciferol (CALCIUM-VITAMIN D) 600-400 MG-UNIT TABS Take 1 tablet by mouth daily.  . cholecalciferol (VITAMIN D) 1000 UNITS tablet Take 2,000 Units by mouth daily.   . cyanocobalamin (,VITAMIN B-12,) 1000 MCG/ML injection Inject 1 mL (1,000 mcg total) into the muscle every 30 (thirty) days. Start 03/2014  . losartan-hydrochlorothiazide (HYZAAR) 100-12.5 MG per tablet TAKE 1 TABLET BY MOUTH EVERY DAY  . nitroGLYCERIN (NITROSTAT) 0.4 MG SL tablet Place 0.4 mg under the tongue every 5 (five) minutes as needed for chest pain.  . [DISCONTINUED] CALCIUM-VITAMIN D PO Take 1 tablet by mouth 2 (two) times daily.   No current facility-administered medications on file prior to visit.    Review of Systems Per HPI unless specifically indicated above     Objective:    BP 150/64 mmHg  Pulse 64  Temp(Src) 98.6 F (37 C) (Oral)  Ht 5\' 1"  (1.549 m)  Wt 157 lb 4 oz (71.328 kg)  BMI 29.73 kg/m2  Wt Readings from Last 3 Encounters:  09/19/14 157 lb 4 oz (71.328 kg)  03/01/14 158 lb (71.668 kg)  09/12/13  157 lb 8 oz (71.442 kg)    Physical Exam  Constitutional: She is oriented to person, place, and time. She appears well-developed and well-nourished. No distress.  HENT:  Head: Normocephalic and atraumatic.  Right Ear: Hearing, tympanic membrane, external ear and ear canal normal.  Left Ear: Hearing, tympanic membrane, external ear and ear canal normal.  Nose: Nose normal.  Mouth/Throat: Uvula is midline, oropharynx is clear and moist and mucous membranes are normal. No oropharyngeal exudate, posterior  oropharyngeal edema or posterior oropharyngeal erythema.  Eyes: Conjunctivae and EOM are normal. Pupils are equal, round, and reactive to light. No scleral icterus.  Neck: Normal range of motion. Neck supple. Carotid bruit is not present. No thyromegaly present.  Cardiovascular: Normal rate, regular rhythm, normal heart sounds and intact distal pulses.   No murmur heard. Pulses:      Radial pulses are 2+ on the right side, and 2+ on the left side.  Pulmonary/Chest: Effort normal and breath sounds normal. No respiratory distress. She has no wheezes. She has no rales.  Abdominal: Soft. Bowel sounds are normal. She exhibits no distension and no mass. There is no tenderness. There is no rebound and no guarding.  Musculoskeletal: Normal range of motion. She exhibits no edema.  Lymphadenopathy:    She has no cervical adenopathy.  Neurological: She is alert and oriented to person, place, and time.  CN grossly intact, station and gait intact Recall 2/3, 3/3 with cue Calculation 5/5 serial 3s  Skin: Skin is warm and dry. No rash noted.  Psychiatric: She has a normal mood and affect. Her behavior is normal. Judgment and thought content normal.  Nursing note and vitals reviewed.  Results for orders placed or performed in visit on 09/12/14  Lipid panel  Result Value Ref Range   Cholesterol 171 0 - 200 mg/dL   Triglycerides 137.0 0.0 - 149.0 mg/dL   HDL 70.70 >39.00 mg/dL   VLDL 27.4 0.0 - 40.0 mg/dL   LDL Cholesterol 73 0 - 99 mg/dL   Total CHOL/HDL Ratio 2    NonHDL 100.30   CBC with Differential/Platelet  Result Value Ref Range   WBC 4.9 4.0 - 10.5 K/uL   RBC 3.84 (L) 3.87 - 5.11 Mil/uL   Hemoglobin 11.9 (L) 12.0 - 15.0 g/dL   HCT 35.2 (L) 36.0 - 46.0 %   MCV 91.7 78.0 - 100.0 fl   MCHC 33.9 30.0 - 36.0 g/dL   RDW 12.8 11.5 - 15.5 %   Platelets 284.0 150.0 - 400.0 K/uL   Neutrophils Relative % 54.5 43.0 - 77.0 %   Lymphocytes Relative 29.8 12.0 - 46.0 %   Monocytes Relative 12.8 (H)  3.0 - 12.0 %   Eosinophils Relative 2.0 0.0 - 5.0 %   Basophils Relative 0.9 0.0 - 3.0 %   Neutro Abs 2.7 1.4 - 7.7 K/uL   Lymphs Abs 1.5 0.7 - 4.0 K/uL   Monocytes Absolute 0.6 0.1 - 1.0 K/uL   Eosinophils Absolute 0.1 0.0 - 0.7 K/uL   Basophils Absolute 0.0 0.0 - 0.1 K/uL  Basic metabolic panel  Result Value Ref Range   Sodium 135 135 - 145 mEq/L   Potassium 4.4 3.5 - 5.1 mEq/L   Chloride 101 96 - 112 mEq/L   CO2 26 19 - 32 mEq/L   Glucose, Bld 95 70 - 99 mg/dL   BUN 19 6 - 23 mg/dL   Creatinine, Ser 1.07 0.40 - 1.20 mg/dL   Calcium 9.9 8.4 -  10.5 mg/dL   GFR 52.21 (L) >60.00 mL/min  Vitamin B12  Result Value Ref Range   Vitamin B-12 286 211 - 911 pg/mL  Ferritin  Result Value Ref Range   Ferritin 40.3 10.0 - 291.0 ng/mL  IBC panel  Result Value Ref Range   Iron 55 42 - 145 ug/dL   Transferrin 276.0 212.0 - 360.0 mg/dL   Saturation Ratios 14.2 (L) 20.0 - 50.0 %  Vit D  25 hydroxy (rtn osteoporosis monitoring)  Result Value Ref Range   VITD 27.12 (L) 30.00 - 100.00 ng/mL  Folate  Result Value Ref Range   Folate >24.8 >5.9 ng/mL      Assessment & Plan:   Problem List Items Addressed This Visit    Advanced care planning/counseling discussion    Advanced directives - has living will at home. HCPOA would be son Priscilla [name]) and daughter Priscilla [name]), also would be ok with other children helping. Ok with CPR, intubation and limited trial for reversible conditions, does not want prolonged life support if terminal condition.      Anemia associated with nutritional deficiency    Improvement noted with iron daily and b12 shots monthly. Normal colonoscopy 2014. Continue iron and b12.       Relevant Medications   ferrous sulfate 325 (65 FE) MG tablet   B12 deficiency    B12 shot today.      Benign hypertensive heart disease without heart failure    bp again elevated today, but endorses better control at home. Given age, no changes indicated currently.      History of colon cancer     Recent colonoscopy stable.      History of rectal cancer    Stable.       History of valvular heart disease    Murmur not appreciated today.      Medicare annual wellness visit, subsequent - Primary    I have personally reviewed the Medicare Annual Wellness questionnaire and have noted 1. The patient's medical and social history 2. Their use of alcohol, tobacco or illicit drugs 3. Their current medications and supplements 4. The patient's functional ability including ADL's, fall risks, home safety risks and hearing or visual impairment. Cognitive function has been assessed and addressed as indicated.  5. Diet and physical activity 6. Evidence for depression or mood disorders The patients weight, height, BMI have been recorded in the chart. I have made referrals, counseling and provided education to the patient based on review of the above and I have provided the pt with a written personalized care plan for preventive services. Provider list updated.. See scanned questionairre as needed for further documentation. Reviewed preventative protocols and updated unless pt declined.       Osteoarthritis    Stable with prn tylenol use      Osteopenia    Continue calcium and vitamin D.      Vitamin D deficiency    Continue vit D 2000 IU daily.          Follow up plan: Return in about 3 months (around 12/20/2014), or as needed, for annual exam, prior fasting for blood work.

## 2014-09-19 NOTE — Assessment & Plan Note (Signed)
Continue calcium and vitamin D 

## 2014-09-19 NOTE — Assessment & Plan Note (Addendum)
Improvement noted with iron daily and b12 shots monthly. Normal colonoscopy 2014. Continue iron and b12.

## 2014-09-19 NOTE — Assessment & Plan Note (Addendum)
Advanced directives - has living will at home. HCPOA would be son Clare Gandy) and daughter Lelan Pons), also would be ok with other children helping. Ok with CPR, intubation and limited trial for reversible conditions, does not want prolonged life support if terminal condition.

## 2014-09-19 NOTE — Assessment & Plan Note (Signed)
Stable

## 2014-09-19 NOTE — Assessment & Plan Note (Signed)
Stable with prn tylenol use

## 2014-09-19 NOTE — Progress Notes (Signed)
Pre visit review using our clinic review tool, if applicable. No additional management support is needed unless otherwise documented below in the visit note. 

## 2014-09-19 NOTE — Addendum Note (Signed)
Addended byCloyd Stagers B on: 09/19/2014 11:39 AM   Modules accepted: Orders, Medications

## 2014-09-19 NOTE — Assessment & Plan Note (Signed)

## 2014-10-02 ENCOUNTER — Ambulatory Visit (INDEPENDENT_AMBULATORY_CARE_PROVIDER_SITE_OTHER): Payer: Medicare Other | Admitting: Cardiology

## 2014-10-02 ENCOUNTER — Encounter: Payer: Self-pay | Admitting: Cardiology

## 2014-10-02 VITALS — BP 130/50 | HR 58 | Ht 61.0 in | Wt 157.0 lb

## 2014-10-02 DIAGNOSIS — I119 Hypertensive heart disease without heart failure: Secondary | ICD-10-CM

## 2014-10-02 DIAGNOSIS — M159 Polyosteoarthritis, unspecified: Secondary | ICD-10-CM

## 2014-10-02 DIAGNOSIS — I359 Nonrheumatic aortic valve disorder, unspecified: Secondary | ICD-10-CM

## 2014-10-02 NOTE — Patient Instructions (Signed)
Medication Instructions:  Your physician recommends that you continue on your current medications as directed. Please refer to the Current Medication list given to you today.  Labwork: NONE  Testing/Procedures: NONE  Follow-Up: Your physician wants you to follow-up in: [place] will receive a reminder letter in the mail two months in advance. If you don't receive a letter, please call our office to schedule the follow-up appointment.

## 2014-10-02 NOTE — Progress Notes (Signed)
Cardiology Office Note   Date:  10/02/2014   ID:  Priscilla [name], DOB 07/02/1930, MRN [id number]  PCP:  Ria Bush, MD  Cardiologist: Darlin Coco MD  Chief Complaint  Patient presents with  . AORTIC VALVE DISEASE      History of Present Illness: Priscilla [name] is a 81 y.o. female who presents for a six-month follow-up office visit This pleasant 81 year old woman is seen for a scheduled followup office visit. She has a past history of essential hypertension and a history of mild aortic valve disease with aortic stenosis and aortic insufficiency. Her last echocardiogram in March 2010 showed moderate aortic insufficiency with mild mitral regurgitation and normal LV function. She does not have any history of ischemic heart disease. She has had a previous right total knee replacement by Dr. Telford Nab.  Now she is having problems with her left knee.  She is being seen by [facility].. In November 2014 she was admitted to [facility] with small bowel obstruction and required surgery. Since last visit she has been doing well. One morning she had severe chest pain which woke her up. She took a single nitroglycerin and had relief within 5 minutes. She has had no further difficulty. For the past several months she has been having some intermittent neuropathic pain involving just the tip of the right third toe.  She saw Dr. Felisa Bonier, podiatrist, who was able to treat her successfully.   Past Medical History  Diagnosis Date  . Chest pain, atypical   . IDA (iron deficiency anemia)   . Heart murmur   . History of DVT (deep vein thrombosis) 2002    right leg, after chemo for first cancer, treated  . Osteoarthritis   . Cancer 2001, 2008    colon, rectal  . Small bowel obstruction 2012    adhesion related  . History of chicken pox   . Osteopenia 09/2011    DEXA spine -2.2, femur -1.6  . Hypertension     dr Mare Ferrari  . Open wound of abdominal wall with  complication   . History of Clostridium difficile 2012    Past Surgical History  Procedure Laterality Date  . Nuclear stress test  11/17/08    NORMAL  . Replacement total knee  2010    RIGHT KNEE  . Ileostomy    . Colon surgery  2001 and 2008  . Appendectomy  1984  . Colectomy    . Colostomy    . Abdominal hysterectomy  1984  . Joint replacement  2010    knee   . Laparotomy  02/05/2011    Procedure: EXPLORATORY LAPAROTOMY;  Surgeon: Harl Bowie, MD;  Location: [place];  Service: General;  Laterality: N/A;  lysis of adhesions.  . Bowel resection  02/05/2011    Procedure: SMALL BOWEL RESECTION;  Surgeon: Harl Bowie, MD;  Location: [place];  Service: General;  Laterality: N/A;  . Esophagogastroduodenoscopy  01/31/2011    Procedure: ESOPHAGOGASTRODUODENOSCOPY (EGD);  Surgeon: Missy Sabins, MD;  Location[facility];  Service: Endoscopy;  Laterality: N/A;  duodenal bx;s r/o giardia, r/o celiac diease  . Flexible sigmoidoscopy  01/31/2011    Procedure: FLEXIBLE SIGMOIDOSCOPY;  Surgeon: Missy Sabins, MD;  Location: [facility];  Service: Endoscopy;  Laterality: N/A;  . Dexa  09/2011    T-score: spine -2.2, femur -1.6  . Broken nose    . Wound debridement N/A 05/21/2012    Procedure: Excision non healing ABDOMINAL WOUND;  Surgeon: Harl Bowie, MD;  Location: [facility];  Service: General;  Laterality: N/A;  . Colonoscopy  12/2012    erosions at colonic anastomosis, o/w normal rpt 5 yrs (Dr. Penelope Coop at [place])     Current Outpatient Prescriptions  Medication Sig Dispense Refill  . aspirin 81 MG chewable tablet Chew 81 mg by mouth daily.    Marland Kitchen atenolol (TENORMIN) 50 MG tablet TAKE 1 TABLET BY MOUTH 2 TIMES DAILY. 180 tablet 0  . Biotin 5 MG CAPS Take 1 capsule by mouth daily.    . Calcium Carb-Cholecalciferol (CALCIUM-VITAMIN D) 600-400 MG-UNIT TABS Take 1 tablet by mouth daily.    . cholecalciferol (VITAMIN D) 1000 UNITS tablet Take 2,000 Units by mouth daily.     .  cyanocobalamin (,VITAMIN B-12,) 1000 MCG/ML injection Inject 1 mL (1,000 mcg total) into the muscle every 30 (thirty) days. Start 03/2014 1 mL 0  . ferrous sulfate 325 (65 FE) MG tablet Take 325 mg by mouth daily with breakfast.    . losartan-hydrochlorothiazide (HYZAAR) 100-12.5 MG per tablet TAKE 1 TABLET BY MOUTH EVERY DAY 90 tablet 3  . nitroGLYCERIN (NITROSTAT) 0.4 MG SL tablet Place 0.4 mg under the tongue every 5 (five) minutes as needed for chest pain.    . [DISCONTINUED] CALCIUM-VITAMIN D PO Take 1 tablet by mouth 2 (two) times daily.     No current facility-administered medications for this visit.    Allergies:   Morphine and related and Lisinopril    Social History:  The patient  reports that she has never smoked. She has never used smokeless tobacco. She reports that she does not drink alcohol or use illicit drugs.   Family History:  The patient's family history includes Cancer in her paternal grandmother; Coronary artery disease in her brother and sister; Heart attack in her brother, father, mother, and sister; Heart disease in her father and mother; Lupus in her father. There is no history of Diabetes, Stroke, or Hypertension.    ROS:  Please see the history of present illness.   Otherwise, review of systems are positive for none.   All other systems are reviewed and negative.    PHYSICAL EXAM: VS:  BP 130/50 mmHg  Pulse 58  Ht 5\' 1"  (1.549 m)  Wt 157 lb (71.215 kg)  BMI 29.68 kg/m2 , BMI Body mass index is 29.68 kg/(m^2). GEN: Well nourished, well developed, in no acute distress HEENT: normal Neck: no JVD, carotid bruits, or masses Cardiac: RRR; no murmurs, rubs, or gallops,no edema  Respiratory:  clear to auscultation bilaterally, normal work of breathing GI: soft, nontender, nondistended, + BS MS: no deformity or atrophy Skin: warm and dry, no rash Neuro:  Strength and sensation are intact Psych: euthymic mood, full affect   EKG:  EKG is ordered today. The ekg  ordered today demonstrates sinus bradycardia with first-degree AV block.  Left axis deviation.  PR interval is 244 ms.  Since the prior tracing of 09/01/13, no significant change.   Recent Labs: 09/12/2014: BUN 19; Creatinine, Ser 1.07; Hemoglobin 11.9*; Platelets 284.0; Potassium 4.4; Sodium 135    Lipid Panel    Component Value Date/Time   CHOL 171 09/12/2014 0838   TRIG 137.0 09/12/2014 0838   HDL 70.70 09/12/2014 0838   CHOLHDL 2 09/12/2014 0838   VLDL 27.4 09/12/2014 0838   LDLCALC 73 09/12/2014 0838   LDLDIRECT 101.7 12/12/2010 0846      Wt Readings from Last 3 Encounters:  10/02/14 157 lb (  71.215 kg)  09/19/14 157 lb 4 oz (71.328 kg)  03/01/14 158 lb (71.668 kg)         ASSESSMENT AND PLAN:  1. benign hypertensive heart disease without heart failure. White coat hypertension plays a role 2. aortic valve disease with mild aortic insufficiency 3. Osteoarthritis of knees.  She has had previous right total knee replacement.  She may need to have similar procedure on her left knee.  She is seeing [facility]. 4. past history of rectal cancer. No evidence of recurrence.    Plan: Continue same medication. Recheck in 6 months for office visit .   Current medicines are reviewed at length with the patient today.  The patient does not have concerns regarding medicines.  The following changes have been made:  no change  Labs/ tests ordered today include:   Orders Placed This Encounter  Procedures  . EKG 12-Lead      Signed, Darlin Coco MD 10/02/2014 5:27 PM    [facility] [place], [place], [place]  [address] Phone: [phone]; Fax: [phone]

## 2014-10-20 ENCOUNTER — Ambulatory Visit (INDEPENDENT_AMBULATORY_CARE_PROVIDER_SITE_OTHER): Payer: Medicare Other

## 2014-10-20 DIAGNOSIS — E538 Deficiency of other specified B group vitamins: Secondary | ICD-10-CM | POA: Diagnosis not present

## 2014-10-20 MED ORDER — CYANOCOBALAMIN 1000 MCG/ML IJ SOLN
1000.0000 ug | Freq: Once | INTRAMUSCULAR | Status: AC
  Administered 2014-10-20: 1000 ug via INTRAMUSCULAR

## 2014-10-31 ENCOUNTER — Emergency Department (HOSPITAL_COMMUNITY): Payer: Medicare Other

## 2014-10-31 ENCOUNTER — Encounter (HOSPITAL_COMMUNITY): Payer: Self-pay | Admitting: Emergency Medicine

## 2014-10-31 ENCOUNTER — Emergency Department (HOSPITAL_COMMUNITY)
Admission: EM | Admit: 2014-10-31 | Discharge: 2014-11-01 | Disposition: A | Discharge status: Home / Self Care | Payer: Medicare Other | Attending: Emergency Medicine | Admitting: Emergency Medicine

## 2014-10-31 DIAGNOSIS — Z86718 Personal history of other venous thrombosis and embolism: Secondary | ICD-10-CM | POA: Diagnosis not present

## 2014-10-31 DIAGNOSIS — M25561 Pain in right knee: Secondary | ICD-10-CM | POA: Insufficient documentation

## 2014-10-31 DIAGNOSIS — M25569 Pain in unspecified knee: Secondary | ICD-10-CM

## 2014-10-31 DIAGNOSIS — Z8719 Personal history of other diseases of the digestive system: Secondary | ICD-10-CM | POA: Insufficient documentation

## 2014-10-31 DIAGNOSIS — Z8619 Personal history of other infectious and parasitic diseases: Secondary | ICD-10-CM | POA: Diagnosis not present

## 2014-10-31 DIAGNOSIS — Z87828 Personal history of other (healed) physical injury and trauma: Secondary | ICD-10-CM | POA: Diagnosis not present

## 2014-10-31 DIAGNOSIS — Z96651 Presence of right artificial knee joint: Secondary | ICD-10-CM | POA: Diagnosis not present

## 2014-10-31 DIAGNOSIS — Z7982 Long term (current) use of aspirin: Secondary | ICD-10-CM | POA: Insufficient documentation

## 2014-10-31 DIAGNOSIS — Z85038 Personal history of other malignant neoplasm of large intestine: Secondary | ICD-10-CM | POA: Insufficient documentation

## 2014-10-31 DIAGNOSIS — I10 Essential (primary) hypertension: Secondary | ICD-10-CM | POA: Diagnosis not present

## 2014-10-31 DIAGNOSIS — M1990 Unspecified osteoarthritis, unspecified site: Secondary | ICD-10-CM | POA: Diagnosis not present

## 2014-10-31 DIAGNOSIS — R011 Cardiac murmur, unspecified: Secondary | ICD-10-CM | POA: Insufficient documentation

## 2014-10-31 DIAGNOSIS — Z79899 Other long term (current) drug therapy: Secondary | ICD-10-CM | POA: Insufficient documentation

## 2014-10-31 DIAGNOSIS — L539 Erythematous condition, unspecified: Secondary | ICD-10-CM | POA: Diagnosis not present

## 2014-10-31 DIAGNOSIS — D509 Iron deficiency anemia, unspecified: Secondary | ICD-10-CM | POA: Insufficient documentation

## 2014-10-31 DIAGNOSIS — Z471 Aftercare following joint replacement surgery: Secondary | ICD-10-CM | POA: Diagnosis not present

## 2014-10-31 NOTE — ED Notes (Signed)
Pt c/o right knee pain, no known injury, unable to apple pressure, no increased swelling, no numbness, rates pain 10/10 with movement.

## 2014-11-01 ENCOUNTER — Emergency Department (HOSPITAL_COMMUNITY): Payer: Medicare Other

## 2014-11-01 DIAGNOSIS — M25561 Pain in right knee: Secondary | ICD-10-CM | POA: Diagnosis not present

## 2014-11-01 DIAGNOSIS — Z96651 Presence of right artificial knee joint: Secondary | ICD-10-CM | POA: Diagnosis not present

## 2014-11-01 DIAGNOSIS — Z471 Aftercare following joint replacement surgery: Secondary | ICD-10-CM | POA: Diagnosis not present

## 2014-11-01 LAB — I-STAT CHEM 8, ED
BUN: 23 mg/dL — ABNORMAL HIGH (ref 6–20)
CALCIUM ION: 1.19 mmol/L (ref 1.13–1.30)
CHLORIDE: 104 mmol/L (ref 101–111)
CREATININE: 1 mg/dL (ref 0.44–1.00)
Glucose, Bld: 98 mg/dL (ref 65–99)
HCT: 34 % — ABNORMAL LOW (ref 36.0–46.0)
Hemoglobin: 11.6 g/dL — ABNORMAL LOW (ref 12.0–15.0)
Potassium: 3.8 mmol/L (ref 3.5–5.1)
Sodium: 136 mmol/L (ref 135–145)
TCO2: 23 mmol/L (ref 0–100)

## 2014-11-01 LAB — CBC WITH DIFFERENTIAL/PLATELET
Basophils Absolute: 0.1 10*3/uL (ref 0.0–0.1)
Basophils Relative: 1 % (ref 0–1)
EOS ABS: 0.1 10*3/uL (ref 0.0–0.7)
Eosinophils Relative: 2 % (ref 0–5)
HCT: 33.9 % — ABNORMAL LOW (ref 36.0–46.0)
Hemoglobin: 11.4 g/dL — ABNORMAL LOW (ref 12.0–15.0)
Lymphocytes Relative: 36 % (ref 12–46)
Lymphs Abs: 1.5 10*3/uL (ref 0.7–4.0)
MCH: 31.1 pg (ref 26.0–34.0)
MCHC: 33.6 g/dL (ref 30.0–36.0)
MCV: 92.4 fL (ref 78.0–100.0)
Monocytes Absolute: 0.4 10*3/uL (ref 0.1–1.0)
Monocytes Relative: 8 % (ref 3–12)
NEUTROS PCT: 53 % (ref 43–77)
Neutro Abs: 2.2 10*3/uL (ref 1.7–7.7)
Platelets: 264 10*3/uL (ref 150–400)
RBC: 3.67 MIL/uL — AB (ref 3.87–5.11)
RDW: 12.4 % (ref 11.5–15.5)
WBC: 4.3 10*3/uL (ref 4.0–10.5)

## 2014-11-01 MED ORDER — FENTANYL CITRATE (PF) 100 MCG/2ML IJ SOLN
50.0000 ug | Freq: Once | INTRAMUSCULAR | Status: AC
  Administered 2014-11-01: 50 ug via INTRAVENOUS
  Filled 2014-11-01: qty 2

## 2014-11-01 MED ORDER — ONDANSETRON HCL 4 MG PO TABS: 4.0000 mg | ORAL_TABLET | Freq: Four times a day (QID) | ORAL | Status: DC

## 2014-11-01 MED ORDER — HYDROCODONE-ACETAMINOPHEN 5-325 MG PO TABS: 2.0000 | ORAL_TABLET | ORAL | Status: DC | PRN

## 2014-11-01 NOTE — ED Notes (Signed)
Pt c/o sudden onset right knee pain while sitting approx 8hrs ago.  Pt denies injury.  Pt states she was sitting at a meeting when pain began.  Pt had knee replacement to right knee 67yrs ago.  Denies other symptoms.

## 2014-11-01 NOTE — Discharge Instructions (Signed)
Knee Bracing Keep your appointment with Dr. Alvan Dame. Knee braces are supports to help stabilize and protect an injured or painful knee. They come in many different styles. They should support and protect the knee without increasing the chance of other injuries to yourself or others. It is important not to have a false sense of security when using a brace. Knee braces that help you to keep using your knee:  Do not restore normal knee stability under high stress forces.  May decrease some aspects of athletic performance. Some of the different types of knee braces are:  Prophylactic knee braces are designed to prevent or reduce the severity of knee injuries during sports that make injury to the knee more likely.  Rehabilitative knee braces are designed to allow protected motion of:  Injured knees.  Knees that have been treated with or without surgery. There is no evidence that the use of a supportive knee brace protects the graft following a successful anterior cruciate ligament (ACL) reconstruction. However, braces are sometimes used to:   Protect injured ligaments.  Control knee movement during the initial healing period. They may be used as part of the treatment program for the various injured ligaments or cartilage of the knee including the:  Anterior cruciate ligament.  Medial collateral ligament.  Medial or lateral cartilage (meniscus).  Posterior cruciate ligament.  Lateral collateral ligament. Rehabilitative knee braces are most commonly used:  During crutch-assisted walking right after injury.  During crutch-assisted walking right after surgery to repair the cartilage and/or cruciate ligament injury.  For a short period of time, 2-8 weeks, after the injury or surgery. The value of a rehabilitative brace as opposed to a cast or splint includes the:  Ability to adjust the brace for swelling.  Ability to remove the brace for examinations, icing, or showering.  Ability to  allow for movement in a controlled range of motion. Functional knee braces give support to knees that have already been injured. They are designed to provide stability for the injured knee and provide protection after repair. Functional knee braces may not affect performance much. Lower extremity muscle strengthening, flexibility, and improvement in technique are more important than bracing in treating ligamentous knee injuries. Functional braces are not a substitute for rehabilitation or surgical procedures. Unloader/off-loader braces are designed to provide pain relief in arthritic knees. Patients with wear and tear arthritis from growing old or from an old cartilage injury (osteoarthritis) of the knee, and bowlegged (varus) or knock-knee (valgus) deformities, often develop increased pain in the arthritic side due to increased loading. Unloader/off-loader braces are made to reduce uneven loading in such knees. There is reduction in bowing out movement in bowlegged knees when the correct unloader brace is used. Patients with advanced osteoarthritis or severe varus or valgus alignment problems would not likely benefit from bracing. Patellofemoral braces help the kneecap to move smoothly and well centered over the end of the femur in the knee.  Most people who wear knee braces feel that they help. However, there is a lack of scientific evidence that knee braces are helpful at the level needed for athletic participation to prevent injury. In spite of this, athletes report an increase in knee stability, pain relief, performance improvement, and confidence during athletics when using a brace.  Different knee problems require different knee braces:  Your caregiver may suggest one kind of knee brace after knee surgery.  A caregiver may choose another kind of knee brace for support instead of surgery for some types  of torn ligaments.  You may also need one for pain in the front of your knee that is not getting  better with strengthening and flexibility exercises. Get your caregiver's advice if you want to try a knee brace. The caregiver will advise you on where to get them and provide a prescription when it is needed to fashion and/or fit the brace. Knee braces are the least important part of preventing knee injuries or getting better following injury. Stretching, strengthening and technique improvement are far more important in caring for and preventing knee injuries. When strengthening your knee, increase your activities a little at a time so as not to develop injuries from overuse. Work out an exercise plan with your caregiver and/or physical therapist to get the best program for you. Do not let a knee brace become a crutch. Always remember, there are no braces which support the knee as well as your original ligaments and cartilage you were born with. Conditioning, proper warm-up, and stretching remain the most important parts of keeping your knees healthy. HOW TO USE A KNEE BRACE  During sports, knee braces should be used as directed by your caregiver.  Make sure that the hinges are where the knee bends.  Straps, tapes, or hook-and-loop tapes should be fastened around your leg as instructed.  You should check the placement of the brace during activities to make sure that it has not moved. Poorly positioned braces can hurt rather than help you.  To work well, a knee brace should be worn during all activities that put you at risk of knee injury.  Warm up properly before beginning athletic activities. HOME CARE INSTRUCTIONS  Knee braces often get damaged during normal use. Replace worn-out braces for maximum benefit.  Clean regularly with soap and water.  Inspect your brace often for wear and tear.  Cover exposed metal to protect others from injury.  Durable materials may cost more, but last longer. SEEK IMMEDIATE MEDICAL CARE IF:   Your knee seems to be getting worse rather than  better.  You have increasing pain or swelling in the knee.  You have problems caused by the knee brace.  You have increased swelling or inflammation (redness or soreness) in your knee.  Your knee becomes warm and more painful and you develop an unexplained temperature over 101F (38.3C). MAKE SURE YOU:   Understand these instructions.  Will watch your condition.  Will get help right away if you are not doing well or get worse. See your caregiver, physical therapist, or orthopedic surgeon for additional information. Document Released: 05/31/2003 Document Revised: 07/25/2013 Document Reviewed: 09/06/2008 [facility] Patient Information 2015 [place], [place]. This information is not intended to replace advice given to you by your health care provider. Make sure you discuss any questions you have with your health care provider.

## 2014-11-01 NOTE — ED Provider Notes (Signed)
CSN: [id number]     Arrival date & time 10/31/14  2228 History   First MD Initiated Contact with Patient 11/01/14 0359     Chief Complaint  Patient presents with  . Knee Pain     (Consider location/radiation/quality/duration/timing/severity/associated sxs/prior Treatment) HPI Comments: This is an 81 year old female with a history of right knee replacement approximately 6 years ago who states tonight while sitting at a meeting, she developed sudden acute pain in her lateral right knee and is been unable to straighten the leg since then.  She denies any numbness or tingling to her feet.  Denies any trauma, denies any past history of similar pain.  She has not taken any medicine for the pain. She has an appointment with Dr. Durward Fortes orthopedic surgeon, August 31 for her left knee  Patient is a 81 y.o. female presenting with knee pain. The history is provided by the patient.  Knee Pain Location:  Knee Injury: no   Knee location:  R knee Pain details:    Quality:  Aching   Radiates to:  Does not radiate   Severity:  Severe   Onset quality:  Sudden   Timing:  Constant   Progression:  Unchanged Chronicity:  New Dislocation: no   Foreign body present:  No foreign bodies Prior injury to area:  No Relieved by:  Nothing Worsened by:  Activity, extension, flexion, bearing weight, adduction, rotation and abduction Ineffective treatments:  None tried Associated symptoms: decreased ROM   Associated symptoms: no fever, no muscle weakness, no numbness, no stiffness and no swelling     Past Medical History  Diagnosis Date  . Chest pain, atypical   . IDA (iron deficiency anemia)   . Heart murmur   . History of DVT (deep vein thrombosis) 2002    right leg, after chemo for first cancer, treated  . Osteoarthritis   . Cancer 2001, 2008    colon, rectal  . Small bowel obstruction 2012    adhesion related  . History of chicken pox   . Osteopenia 09/2011    DEXA spine -2.2, femur -1.6  .  Hypertension     dr Mare Ferrari  . Open wound of abdominal wall with complication   . History of Clostridium difficile 2012   Past Surgical History  Procedure Laterality Date  . Nuclear stress test  11/17/08    NORMAL  . Replacement total knee  2010    RIGHT KNEE  . Ileostomy    . Colon surgery  2001 and 2008  . Appendectomy  1984  . Colectomy    . Colostomy    . Abdominal hysterectomy  1984  . Joint replacement  2010    knee   . Laparotomy  02/05/2011    Procedure: EXPLORATORY LAPAROTOMY;  Surgeon: Harl Bowie, MD;  Location: [place];  Service: General;  Laterality: N/A;  lysis of adhesions.  . Bowel resection  02/05/2011    Procedure: SMALL BOWEL RESECTION;  Surgeon: Harl Bowie, MD;  Location: [place];  Service: General;  Laterality: N/A;  . Esophagogastroduodenoscopy  01/31/2011    Procedure: ESOPHAGOGASTRODUODENOSCOPY (EGD);  Surgeon: Missy Sabins, MD;  Location: [facility] ENDOSCOPY;  Service: Endoscopy;  Laterality: N/A;  duodenal bx;s r/o giardia, r/o celiac diease  . Flexible sigmoidoscopy  01/31/2011    Procedure: FLEXIBLE SIGMOIDOSCOPY;  Surgeon: Missy Sabins, MD;  Location: [place];  Service: Endoscopy;  Laterality: N/A;  . Dexa  09/2011    T-score: spine -2.2, femur -  1.6  . Broken nose    . Wound debridement N/A 05/21/2012    Procedure: Excision non healing ABDOMINAL WOUND;  Surgeon: Harl Bowie, MD;  Location: [place];  Service: General;  Laterality: N/A;  . Colonoscopy  12/2012    erosions at colonic anastomosis, o/w normal rpt 5 yrs (Dr. Penelope Coop at [facility])   Family History  Problem Relation Age of Onset  . Heart disease Mother     CHF, valve problems  . Heart disease Father     due to lupus  . Lupus Father   . Cancer Paternal Grandmother     colon  . Coronary artery disease Brother     s/p CABG  . Coronary artery disease Sister     s/p CABG  . Diabetes Neg Hx   . Heart attack Brother   . Heart attack Sister   . Heart attack Father   . Heart attack  Mother   . Stroke Neg Hx   . Hypertension Neg Hx    Social History  Substance Use Topics  . Smoking status: Never Smoker   . Smokeless tobacco: Never Used  . Alcohol Use: No   OB History    No data available     Review of Systems  Constitutional: Negative for fever.  Musculoskeletal: Positive for arthralgias. Negative for joint swelling and stiffness.  Skin: Negative for color change, rash and wound.  Neurological: Negative for weakness and numbness.  All other systems reviewed and are negative.     Allergies  Morphine and related and Lisinopril  Home Medications   Prior to Admission medications   Medication Sig Start Date End Date Taking? Authorizing Provider  aspirin 81 MG chewable tablet Chew 81 mg by mouth daily.    Historical Provider, MD  atenolol (TENORMIN) 50 MG tablet TAKE 1 TABLET BY MOUTH 2 TIMES DAILY. 08/22/14   Darlin Coco, MD  Biotin 5 MG CAPS Take 1 capsule by mouth daily.    Historical Provider, MD  Calcium Carb-Cholecalciferol (CALCIUM-VITAMIN D) 600-400 MG-UNIT TABS Take 1 tablet by mouth daily. 09/09/12   Ria Bush, MD  cholecalciferol (VITAMIN D) 1000 UNITS tablet Take 2,000 Units by mouth daily.     Historical Provider, MD  cyanocobalamin (,VITAMIN B-12,) 1000 MCG/ML injection Inject 1 mL (1,000 mcg total) into the muscle every 30 (thirty) days. Start 03/2014 03/25/14   Ria Bush, MD  ferrous sulfate 325 (65 FE) MG tablet Take 325 mg by mouth daily with breakfast.    Historical Provider, MD  losartan-hydrochlorothiazide (HYZAAR) 100-12.5 MG per tablet TAKE 1 TABLET BY MOUTH EVERY DAY 11/21/13   Ria Bush, MD  nitroGLYCERIN (NITROSTAT) 0.4 MG SL tablet Place 0.4 mg under the tongue every 5 (five) minutes as needed for chest pain. 02/09/12   Darlin Coco, MD   BP 174/60 mmHg  Pulse 63  Temp(Src) 98 F (36.7 C) (Oral)  Resp 12  SpO2 97% Physical Exam  Constitutional: She is oriented to person, place, and time. She appears  well-nourished. No distress.  HENT:  Head: Normocephalic.  Eyes: Pupils are equal, round, and reactive to light.  Neck: Normal range of motion.  Cardiovascular: Normal rate.   Pulmonary/Chest: Effort normal.  Musculoskeletal: She exhibits tenderness. She exhibits no edema.       Right knee: She exhibits decreased range of motion and erythema. She exhibits no swelling, no effusion, no ecchymosis and no deformity. Tenderness found. Lateral joint line tenderness noted.  Neurological: She is alert and  oriented to person, place, and time.  Skin: Skin is warm and dry. No rash noted. No erythema.  Nursing note and vitals reviewed.   ED Course  Procedures (including critical care time) Labs Review Labs Reviewed  CBC WITH DIFFERENTIAL/PLATELET - Abnormal; Notable for the following:    RBC 3.67 (*)    Hemoglobin 11.4 (*)    HCT 33.9 (*)    All other components within normal limits  I-STAT CHEM 8, ED - Abnormal; Notable for the following:    BUN 23 (*)    Hemoglobin 11.6 (*)    HCT 34.0 (*)    All other components within normal limits    Imaging Review Dg Knee Complete 4 Views Right  10/31/2014   CLINICAL DATA:  Acute onset of right knee pain for 3 hours. Felt knee "go out" while sitting in chair. Initial encounter.  EXAM: RIGHT KNEE - COMPLETE 4+ VIEW  COMPARISON:  None.  FINDINGS: There is no evidence of fracture or dislocation. The patient's knee arthroplasty appears grossly intact, without definite evidence of loosening. No knee joint effusion is identified. Degenerative change is noted about the upper pole of the patella.  IMPRESSION: No evidence of fracture or dislocation. Right knee arthroplasty appears grossly intact, without evidence of loosening.   Electronically Signed   By: Garald Balding M.D.   On: 10/31/2014 23:18     EKG Interpretation None      MDM   Final diagnoses:  None         Junius Creamer, NP 11/01/14 6010  Shanon Rosser, MD 11/01/14 [phone]

## 2014-11-01 NOTE — ED Provider Notes (Signed)
  Physical Exam  BP 174/60 mmHg  Pulse 63  Temp(Src) 98 F (36.7 C) (Oral)  Resp 12  SpO2 97%  Physical Exam Right knee: No joint effusion. There is a vertical scar from a previous right knee replacement. No signs of infection such as warmth, erythema. There is moderate tenderness to the lateral knee. No patellar or fibular head tenderness. She is nonambulatory and in a 20 flexed position. She is able to flex the knee but unable to straighten it due to pain. 2+ DP pulse. ED Course  Procedures  Patient was signed out to me by Junius Creamer. The plan was to discharge the patient home if CT of the right knee was negative for acute fracture.  Recheck: Patient is still complaining of right knee pain. She was given 50 mg of fentanyl prior to me seeing the patient.  I spoke to radiology who stated they could not do the MRI of the knee due to the knee replacement. She has an appointment with Dr. Ihor Gully, orthopedist, on August 31. I discussed negative CT findings with her. I will give the patient Zofran, hydrocodone for pain and knee immobilizer. Patient verbally agrees with the plan.  Ottie Glazier, PA-C 11/01/14 [phone]

## 2014-11-08 DIAGNOSIS — M25562 Pain in left knee: Secondary | ICD-10-CM | POA: Diagnosis not present

## 2014-11-08 DIAGNOSIS — M25561 Pain in right knee: Secondary | ICD-10-CM | POA: Diagnosis not present

## 2014-11-20 ENCOUNTER — Other Ambulatory Visit: Payer: Self-pay | Admitting: Family Medicine

## 2014-11-20 ENCOUNTER — Other Ambulatory Visit: Payer: Self-pay | Admitting: Cardiology

## 2014-11-22 ENCOUNTER — Ambulatory Visit: Payer: Medicare Other

## 2014-11-22 DIAGNOSIS — Z96651 Presence of right artificial knee joint: Secondary | ICD-10-CM | POA: Diagnosis not present

## 2014-11-22 DIAGNOSIS — M2242 Chondromalacia patellae, left knee: Secondary | ICD-10-CM | POA: Diagnosis not present

## 2014-11-22 DIAGNOSIS — M25562 Pain in left knee: Secondary | ICD-10-CM | POA: Diagnosis not present

## 2014-11-22 DIAGNOSIS — M25561 Pain in right knee: Secondary | ICD-10-CM | POA: Diagnosis not present

## 2014-11-28 ENCOUNTER — Ambulatory Visit (INDEPENDENT_AMBULATORY_CARE_PROVIDER_SITE_OTHER): Payer: Medicare Other | Admitting: *Deleted

## 2014-11-28 DIAGNOSIS — D519 Vitamin B12 deficiency anemia, unspecified: Secondary | ICD-10-CM

## 2014-11-28 MED ORDER — CYANOCOBALAMIN 1000 MCG/ML IJ SOLN
1000.0000 ug | Freq: Once | INTRAMUSCULAR | Status: AC
  Administered 2014-11-28: 1000 ug via INTRAMUSCULAR

## 2015-01-03 ENCOUNTER — Telehealth: Payer: Self-pay

## 2015-01-03 ENCOUNTER — Ambulatory Visit (INDEPENDENT_AMBULATORY_CARE_PROVIDER_SITE_OTHER): Payer: Medicare Other

## 2015-01-03 DIAGNOSIS — E538 Deficiency of other specified B group vitamins: Secondary | ICD-10-CM

## 2015-01-03 MED ORDER — CYANOCOBALAMIN 1000 MCG/ML IJ SOLN
1000.0000 ug | Freq: Once | INTRAMUSCULAR | Status: AC
  Administered 2015-01-03: 1000 ug via INTRAMUSCULAR

## 2015-01-03 NOTE — Telephone Encounter (Signed)
Message left advising patient. Rena, please note that she will need lab work prior to her injection in December. I know it hasn't been scheduled yet, but when it is, she will need the lab work prior for an accurate level. Thanks!

## 2015-01-03 NOTE — Telephone Encounter (Signed)
Pt was in office this AM for B 12 injection; pt said it takes 1/2 day to get up, get ready and drive to office and then drive home; pt lives 30 miles round trip from office in [place]. Pt does not have any one that could give her B12 injection at home and pt does not want to give herself the shot; pt wants to know if she could stop B 12 injections and try taking oral B 12. Pt request cb. [facility].

## 2015-01-03 NOTE — Telephone Encounter (Signed)
H/o small bowel obstruction with resection as well as colorectal cancer s/p partial colectomy. Due to this reason, oral supplements may not work as well as IM replacement.   Let's finish monthly B12 shots the rest of this year and check B12 level when she comes for her shot in December (get lab drawn prior to shot) then will decide if we can try oral replacement.  Lab Results  Component Value Date   [id number] 09/12/2014

## 2015-01-09 ENCOUNTER — Encounter: Payer: Self-pay | Admitting: Family Medicine

## 2015-01-09 DIAGNOSIS — Z1231 Encounter for screening mammogram for malignant neoplasm of breast: Secondary | ICD-10-CM | POA: Diagnosis not present

## 2015-01-09 LAB — HM MAMMOGRAPHY: HM Mammogram: NORMAL

## 2015-01-10 ENCOUNTER — Encounter: Payer: Self-pay | Admitting: *Deleted

## 2015-01-23 DIAGNOSIS — H25013 Cortical age-related cataract, bilateral: Secondary | ICD-10-CM | POA: Diagnosis not present

## 2015-01-23 DIAGNOSIS — H2513 Age-related nuclear cataract, bilateral: Secondary | ICD-10-CM | POA: Diagnosis not present

## 2015-02-07 DIAGNOSIS — Z471 Aftercare following joint replacement surgery: Secondary | ICD-10-CM | POA: Diagnosis not present

## 2015-02-07 DIAGNOSIS — M2242 Chondromalacia patellae, left knee: Secondary | ICD-10-CM | POA: Diagnosis not present

## 2015-02-07 DIAGNOSIS — Z96651 Presence of right artificial knee joint: Secondary | ICD-10-CM | POA: Diagnosis not present

## 2015-02-08 ENCOUNTER — Ambulatory Visit (INDEPENDENT_AMBULATORY_CARE_PROVIDER_SITE_OTHER): Payer: Medicare Other

## 2015-02-08 DIAGNOSIS — E538 Deficiency of other specified B group vitamins: Secondary | ICD-10-CM

## 2015-02-08 MED ORDER — CYANOCOBALAMIN 1000 MCG/ML IJ SOLN
1000.0000 ug | Freq: Once | INTRAMUSCULAR | Status: AC
  Administered 2015-02-08: 1000 ug via INTRAMUSCULAR

## 2015-02-08 NOTE — Telephone Encounter (Signed)
Pt came in today for B 12 inj and pt will schedule B 12 lab prior to B 12 inj in Dec.

## 2015-02-18 ENCOUNTER — Other Ambulatory Visit: Payer: Self-pay | Admitting: Cardiology

## 2015-03-13 ENCOUNTER — Ambulatory Visit (INDEPENDENT_AMBULATORY_CARE_PROVIDER_SITE_OTHER): Payer: Medicare Other

## 2015-03-13 ENCOUNTER — Other Ambulatory Visit: Payer: Medicare Other

## 2015-03-13 ENCOUNTER — Other Ambulatory Visit (INDEPENDENT_AMBULATORY_CARE_PROVIDER_SITE_OTHER): Payer: Medicare Other

## 2015-03-13 ENCOUNTER — Ambulatory Visit: Payer: Medicare Other

## 2015-03-13 DIAGNOSIS — E538 Deficiency of other specified B group vitamins: Secondary | ICD-10-CM

## 2015-03-13 LAB — VITAMIN B12: Vitamin B-12: 298 pg/mL (ref 211–911)

## 2015-03-13 MED ORDER — CYANOCOBALAMIN 1000 MCG/ML IJ SOLN
1000.0000 ug | Freq: Once | INTRAMUSCULAR | Status: AC
  Administered 2015-03-13: 1000 ug via INTRAMUSCULAR

## 2015-03-14 ENCOUNTER — Encounter: Payer: Self-pay | Admitting: *Deleted

## 2015-03-15 ENCOUNTER — Encounter: Payer: Self-pay | Admitting: *Deleted

## 2015-03-15 ENCOUNTER — Encounter: Payer: Self-pay | Admitting: Family Medicine

## 2015-03-30 ENCOUNTER — Ambulatory Visit (INDEPENDENT_AMBULATORY_CARE_PROVIDER_SITE_OTHER): Payer: Medicare Other | Admitting: Cardiology

## 2015-03-30 DIAGNOSIS — I119 Hypertensive heart disease without heart failure: Secondary | ICD-10-CM

## 2015-03-30 DIAGNOSIS — I359 Nonrheumatic aortic valve disorder, unspecified: Secondary | ICD-10-CM | POA: Diagnosis not present

## 2015-03-30 DIAGNOSIS — M159 Polyosteoarthritis, unspecified: Secondary | ICD-10-CM | POA: Diagnosis not present

## 2015-03-30 NOTE — Progress Notes (Addendum)
Cardiology Office Note   Date:  03/30/2015   ID:  Priscilla [name], DOB 07-22-1930, MRN [id number]  PCP:  Ria Bush, MD  Cardiologist: Darlin Coco MD  Chief Complaint  Patient presents with  . Follow-up    aortic valve disease c/o shortness of breath with activity and intermittent chest pain  denies le edema      History of Present Illness: Priscilla [name] is a 82 y.o. female who presents for a scheduled follow-up visit.   She has a past history of essential hypertension and a history of mild aortic valve disease with aortic stenosis and aortic insufficiency. Her last echocardiogram in March 2010 showed moderate aortic insufficiency with mild mitral regurgitation and normal LV function. She does not have any history of ischemic heart disease. She has had a previous right total knee replacement by Dr. Telford Nab. The patient has osteoarthritis in her left knee and sees Dr. Alvan Dame who has been able to treat her with intermittent injections into the knee.  In November 2014 she was admitted to [facility] with small bowel obstruction and required surgery. Since last visit she has been doing well. She continues to have chronic diarrhea.  She has not been producing any rectal bleeding.  Dr. Penelope Coop is her gastroenterologist.. The patient has occasional chest pain.  She has never had to take any sublingual nitroglycerin for it however.  Past Medical History  Diagnosis Date  . Chest pain, atypical   . IDA (iron deficiency anemia)   . Heart murmur   . History of DVT (deep vein thrombosis) 2002    right leg, after chemo for first cancer, treated  . Osteoarthritis   . Cancer ([place]) 2001, 2008    colon, rectal  . Small bowel obstruction ([place]) 2012    adhesion related  . History of chicken pox   . Osteopenia 09/2011    DEXA spine -2.2, femur -1.6  . Hypertension     dr Mare Ferrari  . Open wound of abdominal wall with complication   . History of Clostridium difficile 2012  .  B12 deficiency 03/25/2014    H/o small bowel obstruction with resection as well as colorectal cancer s/p partial colectomy.  Receives B12 injections     Past Surgical History  Procedure Laterality Date  . Nuclear stress test  11/17/08    NORMAL  . Replacement total knee  2010    RIGHT KNEE  . Ileostomy    . Colon surgery  2001 and 2008  . Appendectomy  1984  . Colectomy    . Colostomy    . Abdominal hysterectomy  1984  . Joint replacement  2010    knee   . Laparotomy  02/05/2011    Procedure: EXPLORATORY LAPAROTOMY;  Surgeon: Harl Bowie, MD;  Location: [place];  Service: General;  Laterality: N/A;  lysis of adhesions.  . Bowel resection  02/05/2011    Procedure: SMALL BOWEL RESECTION;  Surgeon: Harl Bowie, MD;  Location: [place];  Service: General;  Laterality: N/A;  . Esophagogastroduodenoscopy  01/31/2011    Procedure: ESOPHAGOGASTRODUODENOSCOPY (EGD);  Surgeon: Missy Sabins, MD;  Location: [facility] ENDOSCOPY;  Service: Endoscopy;  Laterality: N/A;  duodenal bx;s r/o giardia, r/o celiac diease  . Flexible sigmoidoscopy  01/31/2011    Procedure: FLEXIBLE SIGMOIDOSCOPY;  Surgeon: Missy Sabins, MD;  Location: [place];  Service: Endoscopy;  Laterality: N/A;  . Dexa  09/2011    T-score: spine -2.2, femur -1.6  .  Broken nose    . Wound debridement N/A 05/21/2012    Procedure: Excision non healing ABDOMINAL WOUND;  Surgeon: Harl Bowie, MD;  Location: [place];  Service: General;  Laterality: N/A;  . Colonoscopy  12/2012    erosions at colonic anastomosis, o/w normal rpt 5 yrs (Dr. Penelope Coop at [place])     Current Outpatient Prescriptions  Medication Sig Dispense Refill  . aspirin 81 MG chewable tablet Chew 81 mg by mouth daily.    Marland Kitchen atenolol (TENORMIN) 50 MG tablet TAKE 1 TABLET BY MOUTH 2 TIMES DAILY. 180 tablet 0  . Biotin 5 MG CAPS Take 1 capsule by mouth daily.    . Calcium Carb-Cholecalciferol (CALCIUM-VITAMIN D) 600-400 MG-UNIT TABS Take 1 tablet by mouth daily.    .  cholecalciferol (VITAMIN D) 1000 UNITS tablet Take 2,000 Units by mouth daily.     . cyanocobalamin (,VITAMIN B-12,) 1000 MCG/ML injection Inject 1 mL (1,000 mcg total) into the muscle every 30 (thirty) days. Start 03/2014 1 mL 0  . ferrous sulfate 325 (65 FE) MG tablet Take 325 mg by mouth daily with breakfast.    . losartan-hydrochlorothiazide (HYZAAR) 100-12.5 MG per tablet TAKE 1 TABLET BY MOUTH EVERY DAY 90 tablet 3  . nitroGLYCERIN (NITROSTAT) 0.4 MG SL tablet Place 0.4 mg under the tongue every 5 (five) minutes as needed for chest pain.    . [DISCONTINUED] CALCIUM-VITAMIN D PO Take 1 tablet by mouth 2 (two) times daily.     No current facility-administered medications for this visit.    Allergies:   Morphine and related and Lisinopril    Social History:  The patient  reports that she has never smoked. She has never used smokeless tobacco. She reports that she does not drink alcohol or use illicit drugs.   Family History:  The patient's family history includes Colon cancer in her paternal grandmother; Congestive Heart Failure in her mother; Coronary artery disease in her brother and sister; Heart attack in her brother, father, mother, and sister; Heart disease in her father and mother; Lupus in her father. There is no history of Diabetes, Stroke, or Hypertension.    ROS:  Please see the history of present illness.   Otherwise, review of systems are positive for none.   All other systems are reviewed and negative.    PHYSICAL EXAM: VS:  BP 140/60 mmHg  Pulse 76  Ht 5\' 1"  (1.549 m)  Wt 158 lb 6.4 oz (71.85 kg)  BMI 29.94 kg/m2 , BMI Body mass index is 29.94 kg/(m^2). GEN: Well nourished, well developed, in no acute distress HEENT: normal Neck: no JVD, carotid bruits, or masses Cardiac: RRR; no rubs, or gallops,no edema .  There is a soft systolic ejection murmur grade 1/6 at the base. Respiratory:  clear to auscultation bilaterally, normal work of breathing GI: soft, nontender,  nondistended, + BS MS: no deformity or atrophy Skin: warm and dry, no rash Neuro:  Strength and sensation are intact Psych: euthymic mood, full affect   EKG:  EKG  ordered today.  EKG shows normal sinus rhythm at 77/m.  Nonspecific T-wave changes.    Recent Labs: 11/01/2014: BUN 23*; Creatinine, Ser 1.00; Hemoglobin 11.6*; Platelets 264; Potassium 3.8; Sodium 136    Lipid Panel    Component Value Date/Time   CHOL 171 09/12/2014 0838   TRIG 137.0 09/12/2014 0838   HDL 70.70 09/12/2014 0838   CHOLHDL 2 09/12/2014 0838   VLDL 27.4 09/12/2014 0838   LDLCALC 73  09/12/2014 0838   LDLDIRECT 101.7 12/12/2010 0846      Wt Readings from Last 3 Encounters:  03/30/15 158 lb 6.4 oz (71.85 kg)  10/02/14 157 lb (71.215 kg)  09/19/14 157 lb 4 oz (71.328 kg)         ASSESSMENT AND PLAN:  1. benign hypertensive heart disease without heart failure. White coat hypertension plays a role 2. aortic valve disease with mild aortic insufficiency.  No symptoms of CHF. 3. Osteoarthritis of knees. She has had previous right total knee replacement. She may need to have similar procedure eventually on her left knee. She is seeing [facility] 4. past history of rectal cancer. No evidence of recurrence.  She is troubled with chronic diarrhea as a result of previous rectal cancer surgery.    Plan: Continue same medication. Recheck in 6 months for office visit with Dr. Radford Pax.    Current medicines are reviewed at length with the patient today.  The patient does not have concerns regarding medicines.  The following changes have been made:  no change  Labs/ tests ordered today include:   Orders Placed This Encounter  Procedures  . EKG 12-Lead     SignedDarlin Coco MD 03/30/2015 12:34 PM    [address], [place], [place]  [address] Phone: [phone]; Fax: [phone]

## 2015-03-30 NOTE — Patient Instructions (Signed)
Medication Instructions:  Your physician recommends that you continue on your current medications as directed. Please refer to the Current Medication list given to you today.  Labwork: NONE  Testing/Procedures: NONE  Follow-Up: Your physician wants you to follow-up in: [place] will receive a reminder letter in the mail two months in advance. If you don't receive a letter, please call our office to schedule the follow-up appointment.  If you need a refill on your cardiac medications before your next appointment, please call your pharmacy.

## 2015-04-18 ENCOUNTER — Ambulatory Visit (INDEPENDENT_AMBULATORY_CARE_PROVIDER_SITE_OTHER): Payer: Medicare Other | Admitting: *Deleted

## 2015-04-18 DIAGNOSIS — E538 Deficiency of other specified B group vitamins: Secondary | ICD-10-CM

## 2015-04-18 MED ORDER — CYANOCOBALAMIN 1000 MCG/ML IJ SOLN
1000.0000 ug | Freq: Once | INTRAMUSCULAR | Status: AC
  Administered 2015-04-18: 1000 ug via INTRAMUSCULAR

## 2015-05-01 ENCOUNTER — Ambulatory Visit (INDEPENDENT_AMBULATORY_CARE_PROVIDER_SITE_OTHER): Payer: Medicare Other

## 2015-05-01 ENCOUNTER — Ambulatory Visit (INDEPENDENT_AMBULATORY_CARE_PROVIDER_SITE_OTHER): Payer: Medicare Other | Admitting: Podiatry

## 2015-05-01 ENCOUNTER — Encounter: Payer: Self-pay | Admitting: Podiatry

## 2015-05-01 VITALS — BP 146/61 | HR 73 | Resp 16

## 2015-05-01 DIAGNOSIS — M2040 Other hammer toe(s) (acquired), unspecified foot: Secondary | ICD-10-CM

## 2015-05-01 DIAGNOSIS — M779 Enthesopathy, unspecified: Secondary | ICD-10-CM | POA: Diagnosis not present

## 2015-05-01 DIAGNOSIS — M79674 Pain in right toe(s): Secondary | ICD-10-CM | POA: Diagnosis not present

## 2015-05-01 DIAGNOSIS — L84 Corns and callosities: Secondary | ICD-10-CM

## 2015-05-01 NOTE — Progress Notes (Signed)
Subjective:     Patient ID: Priscilla [name], female   DOB: 1930-09-06, 82 y.o.   MRN: [id number]  HPI patient presents with multiple lesions that are painful on the bottom of the right foot and a loose damaged big toenail right along with discomfort in the third toe right with lesion at the distal end of the toe   Review of Systems  All other systems reviewed and are negative.      Objective:   Physical Exam  Constitutional: She is oriented to person, place, and time.  Cardiovascular: Intact distal pulses.   Musculoskeletal: Normal range of motion.  Neurological: She is oriented to person, place, and time.  Skin: Skin is warm and dry.  Nursing note and vitals reviewed.  neurovascular status found to be intact with muscle strength found to be diminished but adequate with range of motion diminished of the subtalar midtarsal joint. Patient's found to have multiple keratotic lesions plantar aspect right foot that are painful and is noted to have yellow discoloration with looseness of the right big toenail right and also third digit right with mild swelling     Assessment:     . Multiple problems including probable porokeratotic plantar lesions right along with nail disease and trauma right big toe and digital irritation right third toe    Plan:     H&P and x-rays reviewed with patient. Today I went ahead and I debrided plantar lesions right debrided the nailbed right advised on allowing it to regrow with possibility at one point in future may need to be removed. Discussed third toe do not recommend treatment except for wider shoes and soaks

## 2015-05-20 ENCOUNTER — Other Ambulatory Visit: Payer: Self-pay | Admitting: Cardiology

## 2015-05-22 ENCOUNTER — Ambulatory Visit (INDEPENDENT_AMBULATORY_CARE_PROVIDER_SITE_OTHER): Payer: Medicare Other

## 2015-05-22 DIAGNOSIS — E538 Deficiency of other specified B group vitamins: Secondary | ICD-10-CM

## 2015-05-22 MED ORDER — CYANOCOBALAMIN 1000 MCG/ML IJ SOLN
1000.0000 ug | Freq: Once | INTRAMUSCULAR | Status: AC
  Administered 2015-05-22: 1000 ug via INTRAMUSCULAR

## 2015-05-30 DIAGNOSIS — Z471 Aftercare following joint replacement surgery: Secondary | ICD-10-CM | POA: Diagnosis not present

## 2015-05-30 DIAGNOSIS — M25561 Pain in right knee: Secondary | ICD-10-CM | POA: Diagnosis not present

## 2015-05-30 DIAGNOSIS — Z96651 Presence of right artificial knee joint: Secondary | ICD-10-CM | POA: Diagnosis not present

## 2015-05-30 DIAGNOSIS — M25562 Pain in left knee: Secondary | ICD-10-CM | POA: Diagnosis not present

## 2015-06-21 ENCOUNTER — Telehealth: Payer: Self-pay | Admitting: Family Medicine

## 2015-06-21 NOTE — Telephone Encounter (Signed)
Patient Name: [name]  DOB: [birth year]    Initial Comment Caller states she has been having flu like symptoms and nausea.    Nurse Assessment  Nurse: Raphael Gibney, RN, [place] Date/Time (Eastern Time): 06/21/2015 1:40:50 PM  Confirm and document reason for call. If symptomatic, describe symptoms. You must click the next button to save text entered. ---Caller states she has been coughing. She has a headache. Cough has gone away. No fever. she is nauseated. She is drinking fluids. Nothing tastes well. has been nauseated for the past few days. Vomited on Monday but not since. No diarrhea. No abd pain.  Has the patient traveled out of the country within the last 30 days? ---No  Does the patient have any new or worsening symptoms? ---Yes  Will a triage be completed? ---Yes  Related visit to physician within the last 2 weeks? ---No  Does the PT have any chronic conditions? (i.e. diabetes, asthma, etc.) ---Yes  List chronic conditions. ---bowel problems.  Is this a behavioral health or substance abuse call? ---No     Guidelines    Guideline Title Affirmed Question Affirmed Notes  Nausea Unexplained nausea (all triage questions negative)    Final Disposition User   Omar, RN, Vanita Ingles    Disagree/Comply: Comply

## 2015-06-22 ENCOUNTER — Ambulatory Visit: Payer: Medicare Other

## 2015-06-27 ENCOUNTER — Ambulatory Visit (INDEPENDENT_AMBULATORY_CARE_PROVIDER_SITE_OTHER): Payer: Medicare Other

## 2015-06-27 DIAGNOSIS — E538 Deficiency of other specified B group vitamins: Secondary | ICD-10-CM

## 2015-06-27 MED ORDER — CYANOCOBALAMIN 1000 MCG/ML IJ SOLN
1000.0000 ug | Freq: Once | INTRAMUSCULAR | Status: AC
  Administered 2015-06-27: 1000 ug via INTRAMUSCULAR

## 2015-08-01 ENCOUNTER — Ambulatory Visit (INDEPENDENT_AMBULATORY_CARE_PROVIDER_SITE_OTHER): Payer: Medicare Other

## 2015-08-01 DIAGNOSIS — E538 Deficiency of other specified B group vitamins: Secondary | ICD-10-CM | POA: Diagnosis not present

## 2015-08-01 MED ORDER — CYANOCOBALAMIN 1000 MCG/ML IJ SOLN
1000.0000 ug | Freq: Once | INTRAMUSCULAR | Status: AC
  Administered 2015-08-01: 1000 ug via INTRAMUSCULAR

## 2015-09-12 ENCOUNTER — Other Ambulatory Visit: Payer: Self-pay | Admitting: Family Medicine

## 2015-09-12 DIAGNOSIS — E559 Vitamin D deficiency, unspecified: Secondary | ICD-10-CM

## 2015-09-12 DIAGNOSIS — D508 Other iron deficiency anemias: Secondary | ICD-10-CM

## 2015-09-12 DIAGNOSIS — E538 Deficiency of other specified B group vitamins: Secondary | ICD-10-CM

## 2015-09-13 ENCOUNTER — Ambulatory Visit (INDEPENDENT_AMBULATORY_CARE_PROVIDER_SITE_OTHER): Payer: Medicare Other

## 2015-09-13 ENCOUNTER — Other Ambulatory Visit (INDEPENDENT_AMBULATORY_CARE_PROVIDER_SITE_OTHER): Payer: Medicare Other

## 2015-09-13 DIAGNOSIS — E559 Vitamin D deficiency, unspecified: Secondary | ICD-10-CM | POA: Diagnosis not present

## 2015-09-13 DIAGNOSIS — D508 Other iron deficiency anemias: Secondary | ICD-10-CM

## 2015-09-13 DIAGNOSIS — E538 Deficiency of other specified B group vitamins: Secondary | ICD-10-CM | POA: Diagnosis not present

## 2015-09-13 LAB — CBC WITH DIFFERENTIAL/PLATELET
Basophils Absolute: 42 cells/uL (ref 0–200)
Basophils Relative: 1 %
EOS ABS: 84 {cells}/uL (ref 15–500)
Eosinophils Relative: 2 %
HEMATOCRIT: 34.1 % — AB (ref 35.0–45.0)
HEMOGLOBIN: 11.1 g/dL — AB (ref 11.7–15.5)
LYMPHS PCT: 27 %
Lymphs Abs: 1134 cells/uL (ref 850–3900)
MCH: 29.4 pg (ref 27.0–33.0)
MCHC: 32.6 g/dL (ref 32.0–36.0)
MCV: 90.5 fL (ref 80.0–100.0)
MPV: 9 fL (ref 7.5–12.5)
Monocytes Absolute: 546 cells/uL (ref 200–950)
Monocytes Relative: 13 %
NEUTROS ABS: 2394 {cells}/uL (ref 1500–7800)
Neutrophils Relative %: 57 %
Platelets: 275 10*3/uL (ref 140–400)
RBC: 3.77 MIL/uL — AB (ref 3.80–5.10)
RDW: 13.2 % (ref 11.0–15.0)
WBC: 4.2 10*3/uL (ref 3.8–10.8)

## 2015-09-13 LAB — FERRITIN: Ferritin: 72 ng/mL (ref 20–288)

## 2015-09-13 LAB — VITAMIN B12: Vitamin B-12: 430 pg/mL (ref 200–1100)

## 2015-09-13 MED ORDER — CYANOCOBALAMIN 1000 MCG/ML IJ SOLN
1000.0000 ug | Freq: Once | INTRAMUSCULAR | Status: AC
  Administered 2015-09-13: 1000 ug via INTRAMUSCULAR

## 2015-09-14 LAB — VITAMIN D 25 HYDROXY (VIT D DEFICIENCY, FRACTURES): VIT D 25 HYDROXY: 37 ng/mL (ref 30–100)

## 2015-09-17 LAB — BASIC METABOLIC PANEL
BUN: 19 mg/dL (ref 7–25)
CALCIUM: 9.2 mg/dL (ref 8.6–10.4)
CO2: 24 mmol/L (ref 20–31)
Chloride: 102 mmol/L (ref 98–110)
Creat: 0.99 mg/dL — ABNORMAL HIGH (ref 0.60–0.88)
GLUCOSE: 92 mg/dL (ref 65–99)
Potassium: 4.8 mmol/L (ref 3.5–5.3)
Sodium: 136 mmol/L (ref 135–146)

## 2015-09-17 LAB — IBC PANEL
%SAT: 35 % (ref 11–50)
TIBC: 328 ug/dL (ref 250–450)
UIBC: 214 ug/dL (ref 125–400)

## 2015-09-17 LAB — IRON: Iron: 114 ug/dL (ref 45–160)

## 2015-09-19 DIAGNOSIS — R197 Diarrhea, unspecified: Secondary | ICD-10-CM | POA: Diagnosis not present

## 2015-09-21 ENCOUNTER — Ambulatory Visit (INDEPENDENT_AMBULATORY_CARE_PROVIDER_SITE_OTHER)
Admission: RE | Admit: 2015-09-21 | Discharge: 2015-09-21 | Disposition: A | Discharge status: Home / Self Care | Payer: Medicare Other | Source: Ambulatory Visit | Attending: Family Medicine | Admitting: Family Medicine

## 2015-09-21 ENCOUNTER — Ambulatory Visit (INDEPENDENT_AMBULATORY_CARE_PROVIDER_SITE_OTHER): Payer: Medicare Other | Admitting: Family Medicine

## 2015-09-21 ENCOUNTER — Encounter: Payer: Self-pay | Admitting: Family Medicine

## 2015-09-21 VITALS — BP 148/58 | HR 57 | Temp 97.7°F | Ht 61.0 in | Wt 157.5 lb

## 2015-09-21 DIAGNOSIS — M8580 Other specified disorders of bone density and structure, unspecified site: Secondary | ICD-10-CM

## 2015-09-21 DIAGNOSIS — Z85038 Personal history of other malignant neoplasm of large intestine: Secondary | ICD-10-CM

## 2015-09-21 DIAGNOSIS — R05 Cough: Secondary | ICD-10-CM | POA: Diagnosis not present

## 2015-09-21 DIAGNOSIS — Z0000 Encounter for general adult medical examination without abnormal findings: Secondary | ICD-10-CM

## 2015-09-21 DIAGNOSIS — I119 Hypertensive heart disease without heart failure: Secondary | ICD-10-CM | POA: Diagnosis not present

## 2015-09-21 DIAGNOSIS — Z7189 Other specified counseling: Secondary | ICD-10-CM

## 2015-09-21 DIAGNOSIS — Z85048 Personal history of other malignant neoplasm of rectum, rectosigmoid junction, and anus: Secondary | ICD-10-CM

## 2015-09-21 DIAGNOSIS — R430 Anosmia: Secondary | ICD-10-CM | POA: Insufficient documentation

## 2015-09-21 DIAGNOSIS — D508 Other iron deficiency anemias: Secondary | ICD-10-CM

## 2015-09-21 DIAGNOSIS — R059 Cough, unspecified: Secondary | ICD-10-CM

## 2015-09-21 DIAGNOSIS — E538 Deficiency of other specified B group vitamins: Secondary | ICD-10-CM

## 2015-09-21 DIAGNOSIS — E559 Vitamin D deficiency, unspecified: Secondary | ICD-10-CM

## 2015-09-21 NOTE — Assessment & Plan Note (Signed)
Advanced directives - has living will at home. HCPOA would be son (Ted) and daughter (Marie), also would be ok with other children helping. Ok with CPR, intubation and limited trial for reversible conditions, does not want prolonged life support if terminal condition. Brings copy today 09/21/2015 

## 2015-09-21 NOTE — Assessment & Plan Note (Signed)

## 2015-09-21 NOTE — Assessment & Plan Note (Signed)
Check CXR today for 3+ mo h/o chronic mildly productive cough, h/o colon cancer.

## 2015-09-21 NOTE — Assessment & Plan Note (Addendum)
DEXA 2013.  Continue calcium/vit D.

## 2015-09-21 NOTE — Assessment & Plan Note (Signed)
Chronic, stable. Continue current regimen. 

## 2015-09-21 NOTE — Assessment & Plan Note (Signed)
With dysgeusia. Started after URI, persistent over 3 months.

## 2015-09-21 NOTE — Progress Notes (Signed)
Pre visit review using our clinic review tool, if applicable. No additional management support is needed unless otherwise documented below in the visit note. 

## 2015-09-21 NOTE — Assessment & Plan Note (Signed)
Continue supplementation. Iron levels normal with good iron stores. B12 normal as well.

## 2015-09-21 NOTE — Patient Instructions (Addendum)
We will check xray today. Drop off advanced directive up front to make a copy.  Return as needed or in 1 year for next physical.  Health Maintenance, Female Adopting a healthy lifestyle and getting preventive care can go a long way to promote health and wellness. Talk with your health care provider about what schedule of regular examinations is right for you. This is a good chance for you to check in with your provider about disease prevention and staying healthy. In between checkups, there are plenty of things you can do on your own. Experts have done a lot of research about which lifestyle changes and preventive measures are most likely to keep you healthy. Ask your health care provider for more information. WEIGHT AND DIET  Eat a healthy diet  Be sure to include plenty of vegetables, fruits, low-fat dairy products, and lean protein.  Do not eat a lot of foods high in solid fats, added sugars, or salt.  Get regular exercise. This is one of the most important things you can do for your health.  Most adults should exercise for at least 150 minutes each week. The exercise should increase your heart rate and make you sweat (moderate-intensity exercise).  Most adults should also do strengthening exercises at least twice a week. This is in addition to the moderate-intensity exercise.  Maintain a healthy weight  Body mass index (BMI) is a measurement that can be used to identify possible weight problems. It estimates body fat based on height and weight. Your health care provider can help determine your BMI and help you achieve or maintain a healthy weight.  For females 31 years of age and older:   A BMI below 18.5 is considered underweight.  A BMI of 18.5 to 24.9 is normal.  A BMI of 25 to 29.9 is considered overweight.  A BMI of 30 and above is considered obese.  Watch levels of cholesterol and blood lipids  You should start having your blood tested for lipids and cholesterol at 80  years of age, then have this test every 5 years.  You may need to have your cholesterol levels checked more often if:  Your lipid or cholesterol levels are high.  You are older than 80 years of age.  You are at high risk for heart disease.  CANCER SCREENING   Lung Cancer  Lung cancer screening is recommended for adults 70-94 years old who are at high risk for lung cancer because of a history of smoking.  A yearly low-dose CT scan of the lungs is recommended for people who:  Currently smoke.  Have quit within the past 15 years.  Have at least a 30-pack-year history of smoking. A pack year is smoking an average of one pack of cigarettes a day for 1 year.  Yearly screening should continue until it has been 15 years since you quit.  Yearly screening should stop if you develop a health problem that would prevent you from having lung cancer treatment.  Breast Cancer  Practice breast self-awareness. This means understanding how your breasts normally appear and feel.  It also means doing regular breast self-exams. Let your health care provider know about any changes, no matter how small.  If you are in your 20s or 30s, you should have a clinical breast exam (CBE) by a health care provider every 1-3 years as part of a regular health exam.  If you are 24 or older, have a CBE every year. Also consider having a  breast X-ray (mammogram) every year.  If you have a family history of breast cancer, talk to your health care provider about genetic screening.  If you are at high risk for breast cancer, talk to your health care provider about having an MRI and a mammogram every year.  Breast cancer gene (BRCA) assessment is recommended for women who have family members with BRCA-related cancers. BRCA-related cancers include:  Breast.  Ovarian.  Tubal.  Peritoneal cancers.  Results of the assessment will determine the need for genetic counseling and BRCA1 and BRCA2 testing. Cervical  Cancer Your health care provider may recommend that you be screened regularly for cancer of the pelvic organs (ovaries, uterus, and vagina). This screening involves a pelvic examination, including checking for microscopic changes to the surface of your cervix (Pap test). You may be encouraged to have this screening done every 3 years, beginning at age 63.  For women ages 28-65, health care providers may recommend pelvic exams and Pap testing every 3 years, or they may recommend the Pap and pelvic exam, combined with testing for human papilloma virus (HPV), every 5 years. Some types of HPV increase your risk of cervical cancer. Testing for HPV may also be done on women of any age with unclear Pap test results.  Other health care providers may not recommend any screening for nonpregnant women who are considered low risk for pelvic cancer and who do not have symptoms. Ask your health care provider if a screening pelvic exam is right for you.  If you have had past treatment for cervical cancer or a condition that could lead to cancer, you need Pap tests and screening for cancer for at least 20 years after your treatment. If Pap tests have been discontinued, your risk factors (such as having a new sexual partner) need to be reassessed to determine if screening should resume. Some women have medical problems that increase the chance of getting cervical cancer. In these cases, your health care provider may recommend more frequent screening and Pap tests. Colorectal Cancer  This type of cancer can be detected and often prevented.  Routine colorectal cancer screening usually begins at 80 years of age and continues through 80 years of age.  Your health care provider may recommend screening at an earlier age if you have risk factors for colon cancer.  Your health care provider may also recommend using home test kits to check for hidden blood in the stool.  A small camera at the end of a tube can be used to  examine your colon directly (sigmoidoscopy or colonoscopy). This is done to check for the earliest forms of colorectal cancer.  Routine screening usually begins at age 36.  Direct examination of the colon should be repeated every 5-10 years through 80 years of age. However, you may need to be screened more often if early forms of precancerous polyps or small growths are found. Skin Cancer  Check your skin from head to toe regularly.  Tell your health care provider about any new moles or changes in moles, especially if there is a change in a mole's shape or color.  Also tell your health care provider if you have a mole that is larger than the size of a pencil eraser.  Always use sunscreen. Apply sunscreen liberally and repeatedly throughout the day.  Protect yourself by wearing long sleeves, pants, a wide-brimmed hat, and sunglasses whenever you are outside. HEART DISEASE, DIABETES, AND HIGH BLOOD PRESSURE   High blood pressure causes  heart disease and increases the risk of stroke. High blood pressure is more likely to develop in:  People who have blood pressure in the high end of the normal range (130-139/85-89 mm Hg).  People who are overweight or obese.  People who are African American.  If you are 38-75 years of age, have your blood pressure checked every 3-5 years. If you are 30 years of age or older, have your blood pressure checked every year. You should have your blood pressure measured twice--once when you are at a hospital or clinic, and once when you are not at a hospital or clinic. Record the average of the two measurements. To check your blood pressure when you are not at a hospital or clinic, you can use:  An automated blood pressure machine at a pharmacy.  A home blood pressure monitor.  If you are between 39 years and 69 years old, ask your health care provider if you should take aspirin to prevent strokes.  Have regular diabetes screenings. This involves taking a  blood sample to check your fasting blood sugar level.  If you are at a normal weight and have a low risk for diabetes, have this test once every three years after 80 years of age.  If you are overweight and have a high risk for diabetes, consider being tested at a younger age or more often. PREVENTING INFECTION  Hepatitis B  If you have a higher risk for hepatitis B, you should be screened for this virus. You are considered at high risk for hepatitis B if:  You were born in a country where hepatitis B is common. Ask your health care provider which countries are considered high risk.  Your parents were born in a high-risk country, and you have not been immunized against hepatitis B (hepatitis B vaccine).  You have HIV or AIDS.  You use needles to inject street drugs.  You live with someone who has hepatitis B.  You have had sex with someone who has hepatitis B.  You get hemodialysis treatment.  You take certain medicines for conditions, including cancer, organ transplantation, and autoimmune conditions. Hepatitis C  Blood testing is recommended for:  Everyone born from [birth year] through 1963.  Anyone with known risk factors for hepatitis C. Sexually transmitted infections (STIs)  You should be screened for sexually transmitted infections (STIs) including gonorrhea and chlamydia if:  You are sexually active and are younger than 80 years of age.  You are older than 80 years of age and your health care provider tells you that you are at risk for this type of infection.  Your sexual activity has changed since you were last screened and you are at an increased risk for chlamydia or gonorrhea. Ask your health care provider if you are at risk.  If you do not have HIV, but are at risk, it may be recommended that you take a prescription medicine daily to prevent HIV infection. This is called pre-exposure prophylaxis (PrEP). You are considered at risk if:  You are sexually active and do  not regularly use condoms or know the HIV status of your partner(s).  You take drugs by injection.  You are sexually active with a partner who has HIV. Talk with your health care provider about whether you are at high risk of being infected with HIV. If you choose to begin PrEP, you should first be tested for HIV. You should then be tested every 3 months for as long as you are  taking PrEP.  PREGNANCY   If you are premenopausal and you may become pregnant, ask your health care provider about preconception counseling.  If you may become pregnant, take 400 to 800 micrograms (mcg) of folic acid every day.  If you want to prevent pregnancy, talk to your health care provider about birth control (contraception). OSTEOPOROSIS AND MENOPAUSE   Osteoporosis is a disease in which the bones lose minerals and strength with aging. This can result in serious bone fractures. Your risk for osteoporosis can be identified using a bone density scan.  If you are 4 years of age or older, or if you are at risk for osteoporosis and fractures, ask your health care provider if you should be screened.  Ask your health care provider whether you should take a calcium or vitamin D supplement to lower your risk for osteoporosis.  Menopause may have certain physical symptoms and risks.  Hormone replacement therapy may reduce some of these symptoms and risks. Talk to your health care provider about whether hormone replacement therapy is right for you.  HOME CARE INSTRUCTIONS   Schedule regular health, dental, and eye exams.  Stay current with your immunizations.   Do not use any tobacco products including cigarettes, chewing tobacco, or electronic cigarettes.  If you are pregnant, do not drink alcohol.  If you are breastfeeding, limit how much and how often you drink alcohol.  Limit alcohol intake to no more than 1 drink per day for nonpregnant women. One drink equals 12 ounces of beer, 5 ounces of wine, or 1  ounces of hard liquor.  Do not use street drugs.  Do not share needles.  Ask your health care provider for help if you need support or information about quitting drugs.  Tell your health care provider if you often feel depressed.  Tell your health care provider if you have ever been abused or do not feel safe at home.   This information is not intended to replace advice given to you by your health care provider. Make sure you discuss any questions you have with your health care provider.   Document Released: 09/23/2010 Document Revised: 03/31/2014 Document Reviewed: 02/09/2013 Elsevier Interactive Patient Education [employer].

## 2015-09-21 NOTE — Assessment & Plan Note (Signed)
Followed by GI Ganem.

## 2015-09-21 NOTE — Progress Notes (Signed)
BP 148/58 mmHg  Pulse 57  Temp(Src) 97.7 F (36.5 C) (Oral)  Ht '5\' 1"'$  (1.549 m)  Wt 157 lb 8 oz (71.442 kg)  BMI 29.77 kg/m2  SpO2 97%   CC: medicare wellness visit  Subjective:    Patient ID: Priscilla [name], female    DOB: 1930-11-14, 82 y.o.   MRN: [id number]  HPI: Priscilla [name] is a 82 y.o. female presenting on 09/21/2015 for [facility]   Has been receiving b12 shots regularly since 03/2014.  xifaxan started yesterday for IBS x2 wks. Ongoing diarrhea.  Ongoing insomnia both falling and staying asleep.   Ongoing cough mildly productive of mucous over last 3 months. She did have a cold 3 months ago and since has had loss of taste and smell. No prolonged car or plane rides. Not on hormonal replacement therapy.   Hearing screen normal today.  Vision screen with eye doctor.  2+ falls in last year - tripped in garden on vines. No injury. Never feels premonitory symptoms.  Denies depression, sadness, anhedonia.   Preventative: H/o colon and rectal cancer. H/o bowel resection after SBO. Sees Dr Amedeo Plenty GI.  COLONOSCOPY Date: 12/2012 erosions at colonic anastomosis, o/w normal rpt 5 yrs (Dr. Penelope Coop at [place]) Well woman - has aged out of pap smears.  Mammogram - 12/2014 WNL. Done at [place]. DEXA Date: 09/2011 T-score: spine -2.2, femur -1.6 Flu - yearly Td - 08/2011  peumovax - 2009. prevnar 08/2013 zostavax 04/2014 Advanced directives - has living will at home. HCPOA would be son Clare Gandy) and daughter Lelan Pons), also would be ok with other children helping. Ok with CPR, intubation and limited trial for reversible conditions, does not want prolonged life support if terminal condition. Brings copy today 09/21/2015 Sunscreen use discussed. No changing moles on skin. Seat belt use discussed  Caffeine: decaf  Lives alone, daughter lives nearby to visit. Widow for 16 yrs  Occupation: retired, worked at [employer] office  Edu: HS  Activity: yardwork, walks up and down steps at home  Diet:  good water, fruits/vegetables daily. 1 cup milk/day, cheese   Relevant past medical, surgical, family and social history reviewed and updated as indicated. Interim medical history since our last visit reviewed. Allergies and medications reviewed and updated. Current Outpatient Prescriptions on File Prior to Visit  Medication Sig  . aspirin 81 MG chewable tablet Chew 81 mg by mouth daily.  Marland Kitchen atenolol (TENORMIN) 50 MG tablet TAKE 1 TABLET BY MOUTH 2 TIMES DAILY.  Marland Kitchen Biotin 5 MG CAPS Take 1 capsule by mouth daily.  . Calcium Carb-Cholecalciferol (CALCIUM-VITAMIN D) 600-400 MG-UNIT TABS Take 1 tablet by mouth daily.  . cholecalciferol (VITAMIN D) 1000 UNITS tablet Take 2,000 Units by mouth daily.   . cyanocobalamin (,VITAMIN B-12,) 1000 MCG/ML injection Inject 1 mL (1,000 mcg total) into the muscle every 30 (thirty) days. Start 03/2014  . ferrous sulfate 325 (65 FE) MG tablet Take 325 mg by mouth daily with breakfast.  . losartan-hydrochlorothiazide (HYZAAR) 100-12.5 MG per tablet TAKE 1 TABLET BY MOUTH EVERY DAY  . nitroGLYCERIN (NITROSTAT) 0.4 MG SL tablet Place 0.4 mg under the tongue every 5 (five) minutes as needed for chest pain.  . [DISCONTINUED] CALCIUM-VITAMIN D PO Take 1 tablet by mouth 2 (two) times daily.   No current facility-administered medications on file prior to visit.    Review of Systems Per HPI unless specifically indicated in ROS section     Objective:    BP 148/58 mmHg  Pulse 57  Temp(Src) 97.7 F (36.5 C) (Oral)  Ht '5\' 1"'$  (1.549 m)  Wt 157 lb 8 oz (71.442 kg)  BMI 29.77 kg/m2  SpO2 97%  Wt Readings from Last 3 Encounters:  09/21/15 157 lb 8 oz (71.442 kg)  03/30/15 158 lb 6.4 oz (71.85 kg)  10/02/14 157 lb (71.215 kg)    Physical Exam  Constitutional: She is oriented to person, place, and time. She appears well-developed and well-nourished. No distress.  HENT:  Head: Normocephalic and atraumatic.  Right Ear: Hearing, tympanic membrane, external ear and ear  canal normal.  Left Ear: Hearing, tympanic membrane, external ear and ear canal normal.  Nose: Nose normal.  Mouth/Throat: Uvula is midline, oropharynx is clear and moist and mucous membranes are normal. No oropharyngeal exudate, posterior oropharyngeal edema or posterior oropharyngeal erythema.  Eyes: Conjunctivae and EOM are normal. Pupils are equal, round, and reactive to light. No scleral icterus.  Neck: Normal range of motion. Neck supple. Carotid bruit is not present. No thyromegaly present.  Cardiovascular: Normal rate, regular rhythm, normal heart sounds and intact distal pulses.   No murmur heard. Pulses:      Radial pulses are 2+ on the right side, and 2+ on the left side.  Pulmonary/Chest: Effort normal and breath sounds normal. No respiratory distress. She has no wheezes. She has no rales.  Nagging productive cough present  Abdominal: Soft. Bowel sounds are normal. She exhibits no distension and no mass. There is no tenderness. There is no rebound and no guarding.  Musculoskeletal: Normal range of motion. She exhibits no edema.  Lymphadenopathy:    She has no cervical adenopathy.  Neurological: She is alert and oriented to person, place, and time.  CN grossly intact, station and gait intact Recall 1/3, 3/3 with cue Calculation 3/5 serial 7s  Skin: Skin is warm and dry. No rash noted.  Psychiatric: She has a normal mood and affect. Her behavior is normal. Judgment and thought content normal.  Nursing note and vitals reviewed.  Results for orders placed or performed in visit on 09/13/15  Vitamin B12  Result Value Ref Range   Vitamin B-12 430 200 - 1100 pg/mL  Vitamin D, 25-hydroxy  Result Value Ref Range   Vit D, 25-Hydroxy 37 30 - 100 ng/mL  CBC with Differential/Platelet  Result Value Ref Range   WBC 4.2 3.8 - 10.8 K/uL   RBC 3.77 (L) 3.80 - 5.10 MIL/uL   Hemoglobin 11.1 (L) 11.7 - 15.5 g/dL   HCT 34.1 (L) 35.0 - 45.0 %   MCV 90.5 80.0 - 100.0 fL   MCH 29.4 27.0 -  33.0 pg   MCHC 32.6 32.0 - 36.0 g/dL   RDW 13.2 11.0 - 15.0 %   Platelets 275 140 - 400 K/uL   MPV 9.0 7.5 - 12.5 fL   Neutro Abs 2394 1500 - 7800 cells/uL   Lymphs Abs 1134 850 - 3900 cells/uL   Monocytes Absolute 546 200 - 950 cells/uL   Eosinophils Absolute 84 15 - 500 cells/uL   Basophils Absolute 42 0 - 200 cells/uL   Neutrophils Relative % 57 %   Lymphocytes Relative 27 %   Monocytes Relative 13 %   Eosinophils Relative 2 %   Basophils Relative 1 %   Smear Review Criteria for review not met   Basic Metabolic Panel (BMET)  Result Value Ref Range   Sodium 136 135 - 146 mmol/L   Potassium 4.8 3.5 - 5.3 mmol/L   Chloride 102 98 -  110 mmol/L   CO2 24 20 - 31 mmol/L   Glucose, Bld 92 65 - 99 mg/dL   BUN 19 7 - 25 mg/dL   Creat 0.99 (H) 0.60 - 0.88 mg/dL   Calcium 9.2 8.6 - 10.4 mg/dL  Ferritin  Result Value Ref Range   Ferritin 72 20 - 288 ng/mL  IBC Panel  Result Value Ref Range   UIBC 214 125 - 400 ug/dL   TIBC 328 250 - 450 ug/dL   %SAT 35 11 - 50 %  Iron  Result Value Ref Range   Iron 114 45 - 160 ug/dL      Assessment & Plan:   Problem List Items Addressed This Visit    Benign hypertensive heart disease without heart failure    Chronic, stable. Continue current regimen.       History of colon cancer    Followed by GI Ganem.      History of rectal cancer   Medicare annual wellness visit, subsequent - Primary    I have personally reviewed the Medicare Annual Wellness questionnaire and have noted 1. The patient's medical and social history 2. Their use of alcohol, tobacco or illicit drugs 3. Their current medications and supplements 4. The patient's functional ability including ADL's, fall risks, home safety risks and hearing or visual impairment. Cognitive function has been assessed and addressed as indicated.  5. Diet and physical activity 6. Evidence for depression or mood disorders The patients weight, height, BMI have been recorded in the chart. I  have made referrals, counseling and provided education to the patient based on review of the above and I have provided the pt with a written personalized care plan for preventive services. Provider list updated.. See scanned questionairre as needed for further documentation. Reviewed preventative protocols and updated unless pt declined.       Osteopenia    DEXA 2013.  Continue calcium/vit D.       Anemia associated with nutritional deficiency    Continue supplementation. Iron levels normal with good iron stores. B12 normal as well.       Vitamin D deficiency   B12 deficiency   Advanced care planning/counseling discussion    Advanced directives - has living will at home. HCPOA would be son Clare Gandy) and daughter Lelan Pons), also would be ok with other children helping. Ok with CPR, intubation and limited trial for reversible conditions, does not want prolonged life support if terminal condition. Brings copy today 09/21/2015      Anosmia    With dysgeusia. Started after URI, persistent over 3 months.      Cough    Check CXR today for 3+ mo h/o chronic mildly productive cough, h/o colon cancer.      Relevant Orders   DG Chest 2 View       Follow up plan: Return in about 1 year (around 09/20/2016), or as needed, for medicare wellness visit.  Ria Bush, MD

## 2015-09-24 ENCOUNTER — Encounter: Payer: Self-pay | Admitting: *Deleted

## 2015-09-28 ENCOUNTER — Ambulatory Visit: Payer: Medicare Other | Admitting: Cardiology

## 2015-10-17 ENCOUNTER — Ambulatory Visit (INDEPENDENT_AMBULATORY_CARE_PROVIDER_SITE_OTHER): Payer: Medicare Other

## 2015-10-17 DIAGNOSIS — E538 Deficiency of other specified B group vitamins: Secondary | ICD-10-CM

## 2015-10-17 MED ORDER — CYANOCOBALAMIN 1000 MCG/ML IJ SOLN
1000.0000 ug | Freq: Once | INTRAMUSCULAR | Status: AC
  Administered 2015-10-17: 1000 ug via INTRAMUSCULAR

## 2015-10-19 DIAGNOSIS — X32XXXA Exposure to sunlight, initial encounter: Secondary | ICD-10-CM | POA: Diagnosis not present

## 2015-10-19 DIAGNOSIS — B070 Plantar wart: Secondary | ICD-10-CM | POA: Diagnosis not present

## 2015-10-19 DIAGNOSIS — D225 Melanocytic nevi of trunk: Secondary | ICD-10-CM | POA: Diagnosis not present

## 2015-10-19 DIAGNOSIS — L570 Actinic keratosis: Secondary | ICD-10-CM | POA: Diagnosis not present

## 2015-10-19 DIAGNOSIS — L821 Other seborrheic keratosis: Secondary | ICD-10-CM | POA: Diagnosis not present

## 2015-10-22 DIAGNOSIS — R197 Diarrhea, unspecified: Secondary | ICD-10-CM | POA: Diagnosis not present

## 2015-10-26 DIAGNOSIS — M25562 Pain in left knee: Secondary | ICD-10-CM | POA: Diagnosis not present

## 2015-11-13 ENCOUNTER — Encounter: Payer: Self-pay | Admitting: Cardiology

## 2015-11-20 ENCOUNTER — Ambulatory Visit (INDEPENDENT_AMBULATORY_CARE_PROVIDER_SITE_OTHER): Payer: Medicare Other | Admitting: Family Medicine

## 2015-11-20 DIAGNOSIS — E538 Deficiency of other specified B group vitamins: Secondary | ICD-10-CM | POA: Diagnosis not present

## 2015-11-20 MED ORDER — CYANOCOBALAMIN 1000 MCG/ML IJ SOLN
1000.0000 ug | Freq: Once | INTRAMUSCULAR | Status: AC
  Administered 2015-11-20: 1000 ug via INTRAMUSCULAR

## 2015-11-20 NOTE — Progress Notes (Signed)
b12 shot today. 

## 2015-11-23 HISTORY — PX: CARDIOVASCULAR STRESS TEST: SHX262

## 2015-11-27 ENCOUNTER — Other Ambulatory Visit: Payer: Self-pay | Admitting: *Deleted

## 2015-11-27 ENCOUNTER — Encounter (INDEPENDENT_AMBULATORY_CARE_PROVIDER_SITE_OTHER): Payer: Self-pay

## 2015-11-27 ENCOUNTER — Encounter: Payer: Self-pay | Admitting: Cardiology

## 2015-11-27 ENCOUNTER — Ambulatory Visit (INDEPENDENT_AMBULATORY_CARE_PROVIDER_SITE_OTHER): Payer: Medicare Other | Admitting: Cardiology

## 2015-11-27 VITALS — BP 142/58 | HR 59 | Ht 61.0 in | Wt 156.4 lb

## 2015-11-27 DIAGNOSIS — I119 Hypertensive heart disease without heart failure: Secondary | ICD-10-CM

## 2015-11-27 DIAGNOSIS — I359 Nonrheumatic aortic valve disorder, unspecified: Secondary | ICD-10-CM

## 2015-11-27 DIAGNOSIS — R0789 Other chest pain: Secondary | ICD-10-CM

## 2015-11-27 DIAGNOSIS — R0609 Other forms of dyspnea: Secondary | ICD-10-CM | POA: Insufficient documentation

## 2015-11-27 DIAGNOSIS — R079 Chest pain, unspecified: Secondary | ICD-10-CM | POA: Insufficient documentation

## 2015-11-27 MED ORDER — LOSARTAN POTASSIUM-HCTZ 100-12.5 MG PO TABS: 1.0000 | ORAL_TABLET | Freq: Every day | ORAL | 3 refills | Status: DC

## 2015-11-27 MED ORDER — ATENOLOL 50 MG PO TABS: 50.0000 mg | ORAL_TABLET | Freq: Two times a day (BID) | ORAL | 3 refills | Status: DC

## 2015-11-27 MED ORDER — NITROGLYCERIN 0.4 MG SL SUBL: 0.4000 mg | SUBLINGUAL_TABLET | SUBLINGUAL | 1 refills | Status: DC | PRN

## 2015-11-27 NOTE — Progress Notes (Signed)
Cardiology Office Note    Date:  11/27/2015   ID:  Priscilla [name], DOB 10/13/1930, MRN [id number]  PCP:  Ria Bush, MD  Cardiologist:  Fransico Him, MD   Chief Complaint  Patient presents with  . Follow-up    AI/DOE/HTN    History of Present Illness:  Priscilla [name] is a 82 y.o. female past history of essential hypertension, mild aortic valve disease with aortic stenosis and aortic insufficiency. Her last echocardiogram in March 2010 showed moderate aortic insufficiency with mild mitral regurgitation and normal LV function. She does not have any history of ischemic heart disease.  Since last visit she has been doing well. She was followed by Dr. Mare Ferrari in the past.  She denies any  LE edema, dizziness, palpitations or syncope. She has chronic DOE which she thinks has gotten worse over the summer when working in the yard or walking.  She has had some CP that is midsternal in the past and this has not changed.  She has not had to take any NTG since seeing Dr. Mare Ferrari last. She had a nuclear stress test in 2010 that showed no ischemia but she cannot recall whether she was having CP at that time.  Past Medical History:  Diagnosis Date  . B12 deficiency 03/25/2014   H/o small bowel obstruction with resection as well as colorectal cancer s/p partial colectomy.  Receives B12 injections   . Cancer ([place]) 2001, 2008   colon, rectal  . Chest pain, atypical   . Heart murmur   . History of chicken pox   . History of Clostridium difficile 2012  . History of DVT (deep vein thrombosis) 2002   right leg, after chemo for first cancer, treated  . Hypertension    dr Mare Ferrari  . IDA (iron deficiency anemia)   . Moderate aortic insufficiency   . Open wound of abdominal wall with complication   . Osteoarthritis   . Osteopenia 09/2011   DEXA spine -2.2, femur -1.6  . SBO (small bowel obstruction) ([place]) 02/05/2011   On 02/05/11 she had enterolysis and small bowel resection     Past  Surgical History:  Procedure Laterality Date  . ABDOMINAL HYSTERECTOMY  1984  . APPENDECTOMY  1984  . BOWEL RESECTION  02/05/2011   Procedure: SMALL BOWEL RESECTION;  Surgeon: Harl Bowie, MD;  Location: [place];  Service: General;  Laterality: N/A;  . broken nose    . COLECTOMY    . COLON SURGERY  2001 and 2008  . COLONOSCOPY  12/2012   erosions at colonic anastomosis, o/w normal rpt 5 yrs (Dr. Penelope Coop at [place])  . COLOSTOMY    . DEXA  09/2011   T-score: spine -2.2, femur -1.6  . ESOPHAGOGASTRODUODENOSCOPY  01/31/2011   Procedure: ESOPHAGOGASTRODUODENOSCOPY (EGD);  Surgeon: Missy Sabins, MD;  Location: [facility] END[facility]COPY;  Service: Endoscopy;  Laterality: N/A;  duodenal bx;s r/o giardia, r/o celiac diease  . FLEXIBLE SIGMOIDOSCOPY  01/31/2011   Procedure: FLEXIBLE SIGMOIDOSCOPY;  Surgeon: Missy Sabins, MD;  Location: [place];  Service: Endoscopy;  Laterality: N/A;  . ILEOSTOMY    . JOINT REPLACEMENT  2010   knee   . LAPAROTOMY  02/05/2011   Procedure: EXPLORATORY LAPAROTOMY;  Surgeon: Harl Bowie, MD;  Location: [place];  Service: General;  Laterality: N/A;  lysis of adhesions.  . NUCLEAR STRESS TEST  11/17/08   NORMAL  . REPLACEMENT TOTAL KNEE  2010   RIGHT KNEE  . WOUND DEBRIDEMENT N/A  05/21/2012   Procedure: Excision non healing ABDOMINAL WOUND;  Surgeon: Harl Bowie, MD;  Location: [place];  Service: General;  Laterality: N/A;    Current Medications: Outpatient Medications Prior to Visit  Medication Sig Dispense Refill  . aspirin 81 MG chewable tablet Chew 81 mg by mouth daily.    Marland Kitchen atenolol (TENORMIN) 50 MG tablet TAKE 1 TABLET BY MOUTH 2 TIMES DAILY. 180 tablet 1  . Biotin 5 MG CAPS Take 1 capsule by mouth daily.    . Calcium Carb-Cholecalciferol (CALCIUM-VITAMIN D) 600-400 MG-UNIT TABS Take 1 tablet by mouth daily.    . cholecalciferol (VITAMIN D) 1000 UNITS tablet Take 2,000 Units by mouth daily.     . cyanocobalamin (,VITAMIN B-12,) 1000 MCG/ML injection Inject  1 mL (1,000 mcg total) into the muscle every 30 (thirty) days. Start 03/2014 1 mL 0  . ferrous sulfate 325 (65 FE) MG tablet Take 325 mg by mouth daily with breakfast.    . losartan-hydrochlorothiazide (HYZAAR) 100-12.5 MG per tablet TAKE 1 TABLET BY MOUTH EVERY DAY 90 tablet 3  . nitroGLYCERIN (NITROSTAT) 0.4 MG SL tablet Place 0.4 mg under the tongue every 5 (five) minutes as needed for chest pain.    Marland Kitchen XIFAXAN 550 MG TABS tablet Take 1 tablet by mouth 2 (two) times daily.     No facility-administered medications prior to visit.      Allergies:   Morphine and related and Lisinopril   Social History   Social History  . Marital status: Widowed    Spouse name: N/A  . Number of children: N/A  . Years of education: N/A   Social History Main Topics  . Smoking status: Never Smoker  . Smokeless tobacco: Never Used  . Alcohol use No  . Drug use: No  . Sexual activity: Not Asked   Other Topics Concern  . None   Social History Narrative   Caffeine: decaf   Lives alone, daughter lives nearby to visit.   Occupation: retired, worked at [employer] office   Edu: HS   Activity: tries to get walking, walks up and down steps at home   Diet: good water, fruits/vegetables daily.  1 cup milk/day, cheese.      Cards: Dr. Mare Ferrari   Surgeon: Dr. Rush Farmer     Family History:  The patient's family history includes Colon cancer in her paternal grandmother; Congestive Heart Failure in her mother; Coronary artery disease in her brother and sister; Heart attack in her brother, father, mother, and sister; Heart disease in her father and mother; Lupus in her father.   ROS:   Please see the history of present illness.    ROS All other systems reviewed and are negative.   PHYSICAL EXAM:   VS:  BP (!) 142/58   Pulse (!) 59   Ht 5\' 1"  (1.549 m)   Wt 156 lb 6.4 oz (70.9 kg)   SpO2 97%   BMI 29.55 kg/m    GEN: Well nourished, well developed, in no acute distress  HEENT: normal  Neck: no JVD,  carotid bruits, or masses Cardiac: RRR; no murmurs, rubs, or gallops,no edema.  Intact distal pulses bilaterally.  Respiratory:  clear to auscultation bilaterally, normal work of breathing GI: soft, nontender, nondistended, + BS MS: no deformity or atrophy  Skin: warm and dry, no rash Neuro:  Alert and Oriented x 3, Strength and sensation are intact Psych: euthymic mood, full affect  Wt Readings from Last 3 Encounters:  11/27/15 156  lb 6.4 oz (70.9 kg)  09/21/15 157 lb 8 oz (71.4 kg)  03/30/15 158 lb 6.4 oz (71.8 kg)      Studies/Labs Reviewed:   EKG:  EKG is not ordered today.    Recent Labs: 09/13/2015: BUN 19; Creat 0.99; Hemoglobin 11.1; Platelets 275; Potassium 4.8; Sodium 136   Lipid Panel    Component Value Date/Time   CHOL 171 09/12/2014 0838   TRIG 137.0 09/12/2014 0838   HDL 70.70 09/12/2014 0838   CHOLHDL 2 09/12/2014 0838   VLDL 27.4 09/12/2014 0838   LDLCALC 73 09/12/2014 0838   LDLDIRECT 101.7 12/12/2010 0846    Additional studies/ records that were reviewed today include:  none    ASSESSMENT:    1. Aortic valve disease   2. Benign hypertensive heart disease without heart failure   3. Atypical chest pain   4. DOE (dyspnea on exertion)      PLAN:  In order of problems listed above:  1. Moderate AI by echo 7 years ago.  I will repeat an echo to make sure this is stable and LV has not dilated. 2. HTN - BP controlled on current meds.  Continue Hyzaar/Atenolol. She says that she has occasionally had some weakness associated with SBP in the 90's. This was this summer when working outside in the heat.  Her BP is controlled today. I have asked her to stay in doors during the hottest part of the day and make sure she stays well hydrated.  I have asked her to check her BP daily for a week and call with the results.  If she has weakness with low Bp even after staying hydrated we may need to cut out the diuretic. 3.   Atypical CP - she has a history of CP in the  past with no ischemia on stress test 2010.  I will get a stress myoview to rule out ischemia since she has had some more episodes of CP. 4.   DOE - I suspect this is due to working out in the heat but will get an echo to make sure her AI has not worsened.  Medication Adjustments/Labs and Tests Ordered: Current medicines are reviewed at length with the patient today.  Concerns regarding medicines are outlined above.  Medication changes, Labs and Tests ordered today are listed in the Patient Instructions below.  There are no Patient Instructions on file for this visit.   Signed, Fransico Him, MD  11/27/2015 9:42 AM    [facility] [address], [place], [place]  [address] Phone: [phone]; Fax: [phone]

## 2015-11-27 NOTE — Patient Instructions (Signed)
Medication Instructions:  Your physician recommends that you continue on your current medications as directed. Please refer to the Current Medication list given to you today.   Labwork: None  Testing/Procedures: Your physician has requested that you have an echocardiogram. Echocardiography is a painless test that uses sound waves to create images of your heart. It provides your doctor with information about the size and shape of your heart and how well your heart's chambers and valves are working. This procedure takes approximately one hour. There are no restrictions for this procedure.  Dr. Turner recommends you have a NUCLEAR STRESS TEST.  Follow-Up: Your physician wants you to follow-up in: 6 months with Dr. Turner. You will receive a reminder letter in the mail two months in advance. If you don't receive a letter, please call our office to schedule the follow-up appointment.   Any Other Special Instructions Will Be Listed Below (If Applicable).     If you need a refill on your cardiac medications before your next appointment, please call your pharmacy.   

## 2015-12-03 ENCOUNTER — Telehealth (HOSPITAL_COMMUNITY): Payer: Self-pay | Admitting: *Deleted

## 2015-12-03 NOTE — Telephone Encounter (Signed)
Patient given detailed instructions per Myocardial Perfusion Study Information Sheet for the test on  12/05/15 Patient notified to arrive 15 minutes early and that it is imperative to arrive on time for appointment to keep from having the test rescheduled.  If you need to cancel or reschedule your appointment, please call the office within 24 hours of your appointment. Failure to do so may result in a cancellation of your appointment, and a $50 no show fee. Patient verbalized understanding. Hubbard Robinson, RN

## 2015-12-05 ENCOUNTER — Ambulatory Visit (HOSPITAL_COMMUNITY): Payer: Medicare Other | Attending: Cardiovascular Disease

## 2015-12-05 DIAGNOSIS — R0789 Other chest pain: Secondary | ICD-10-CM | POA: Insufficient documentation

## 2015-12-05 DIAGNOSIS — R0609 Other forms of dyspnea: Secondary | ICD-10-CM | POA: Diagnosis not present

## 2015-12-05 DIAGNOSIS — I10 Essential (primary) hypertension: Secondary | ICD-10-CM | POA: Insufficient documentation

## 2015-12-05 LAB — MYOCARDIAL PERFUSION IMAGING
CHL CUP NUCLEAR SSS: 4
LHR: 0.34
LV sys vol: 18 mL
LVDIAVOL: 73 mL (ref 46–106)
NUC STRESS TID: 1.04
Peak HR: 81 {beats}/min
Rest HR: 61 {beats}/min
SDS: 0
SRS: 4

## 2015-12-05 MED ORDER — REGADENOSON 0.4 MG/5ML IV SOLN
0.4000 mg | Freq: Once | INTRAVENOUS | Status: AC
  Administered 2015-12-05: 0.4 mg via INTRAVENOUS

## 2015-12-05 MED ORDER — TECHNETIUM TC 99M TETROFOSMIN IV KIT
32.8000 | PACK | Freq: Once | INTRAVENOUS | Status: AC | PRN
  Administered 2015-12-05: 32.8 via INTRAVENOUS
  Filled 2015-12-05: qty 33

## 2015-12-05 MED ORDER — TECHNETIUM TC 99M TETROFOSMIN IV KIT
11.0000 | PACK | Freq: Once | INTRAVENOUS | Status: AC | PRN
  Administered 2015-12-05: 11 via INTRAVENOUS
  Filled 2015-12-05: qty 11

## 2015-12-06 ENCOUNTER — Encounter: Payer: Self-pay | Admitting: Family Medicine

## 2015-12-06 ENCOUNTER — Telehealth: Payer: Self-pay | Admitting: Cardiology

## 2015-12-06 NOTE — Telephone Encounter (Signed)
Informed patient that no changes to medications are being made. She was grateful for call.

## 2015-12-06 NOTE — Telephone Encounter (Signed)
BP reviewed no new changes

## 2015-12-06 NOTE — Telephone Encounter (Signed)
New message ° ° ° ° ° ° °Pt returning nurse call  °

## 2015-12-06 NOTE — Telephone Encounter (Signed)
Informed patient of results and verbal understanding expressed.   Patient also would like to report BP to Dr. Radford Pax: 9/6:   116/46 9/7:   137/49 9/8:   109/41 9/9:   150/53 9/10: 133/50 9/11: 136/47 9/12: 145/55 Patient has no complaints.  To Dr. Radford Pax for review.

## 2015-12-06 NOTE — Telephone Encounter (Signed)
-----   Message from Sueanne Margarita, MD sent at 12/05/2015  4:18 PM EDT ----- Please let patient know that stress test was fine

## 2015-12-10 ENCOUNTER — Other Ambulatory Visit: Payer: Self-pay

## 2015-12-10 ENCOUNTER — Ambulatory Visit (HOSPITAL_COMMUNITY): Payer: Medicare Other | Attending: Cardiology

## 2015-12-10 DIAGNOSIS — R0609 Other forms of dyspnea: Secondary | ICD-10-CM

## 2015-12-10 DIAGNOSIS — I359 Nonrheumatic aortic valve disorder, unspecified: Secondary | ICD-10-CM

## 2015-12-10 DIAGNOSIS — I10 Essential (primary) hypertension: Secondary | ICD-10-CM | POA: Diagnosis not present

## 2015-12-10 DIAGNOSIS — I082 Rheumatic disorders of both aortic and tricuspid valves: Secondary | ICD-10-CM | POA: Insufficient documentation

## 2015-12-11 ENCOUNTER — Telehealth: Payer: Self-pay | Admitting: Cardiology

## 2015-12-11 NOTE — Progress Notes (Signed)
lmtcb 12/11/15

## 2015-12-11 NOTE — Telephone Encounter (Signed)
°  Follow Up   Pt is calling to follow up on ECHO results from yesterday. Please call.

## 2015-12-11 NOTE — Telephone Encounter (Signed)
Informed pt of echo results. Pt verbalized understanding. 

## 2015-12-17 ENCOUNTER — Telehealth: Payer: Self-pay

## 2015-12-17 DIAGNOSIS — I351 Nonrheumatic aortic (valve) insufficiency: Secondary | ICD-10-CM

## 2015-12-17 NOTE — Telephone Encounter (Signed)
Repeat ECHO ordered to be scheduled in 1 year. 

## 2015-12-17 NOTE — Telephone Encounter (Signed)
-----   Message from Sueanne Margarita, MD sent at 12/11/2015 10:54 AM EDT ----- Echo showed normal LVF with mildly thickened heart muscle, mild to moderate AR - repeat echo in 1 year for AR

## 2015-12-25 DIAGNOSIS — Z23 Encounter for immunization: Secondary | ICD-10-CM | POA: Diagnosis not present

## 2015-12-26 ENCOUNTER — Ambulatory Visit (INDEPENDENT_AMBULATORY_CARE_PROVIDER_SITE_OTHER): Payer: Medicare Other

## 2015-12-26 DIAGNOSIS — E538 Deficiency of other specified B group vitamins: Secondary | ICD-10-CM | POA: Diagnosis not present

## 2015-12-26 MED ORDER — CYANOCOBALAMIN 1000 MCG/ML IJ SOLN
1000.0000 ug | Freq: Once | INTRAMUSCULAR | Status: AC
  Administered 2015-12-26: 1000 ug via INTRAMUSCULAR

## 2016-01-11 DIAGNOSIS — Z1231 Encounter for screening mammogram for malignant neoplasm of breast: Secondary | ICD-10-CM | POA: Diagnosis not present

## 2016-01-11 DIAGNOSIS — M8589 Other specified disorders of bone density and structure, multiple sites: Secondary | ICD-10-CM | POA: Diagnosis not present

## 2016-01-11 LAB — HM MAMMOGRAPHY

## 2016-01-11 LAB — HM DEXA SCAN

## 2016-01-14 ENCOUNTER — Encounter: Payer: Self-pay | Admitting: Family Medicine

## 2016-01-15 ENCOUNTER — Encounter: Payer: Self-pay | Admitting: *Deleted

## 2016-01-18 ENCOUNTER — Encounter: Payer: Self-pay | Admitting: Family Medicine

## 2016-01-20 ENCOUNTER — Encounter: Payer: Self-pay | Admitting: Family Medicine

## 2016-01-21 ENCOUNTER — Encounter: Payer: Self-pay | Admitting: *Deleted

## 2016-01-28 DIAGNOSIS — H2513 Age-related nuclear cataract, bilateral: Secondary | ICD-10-CM | POA: Diagnosis not present

## 2016-01-28 DIAGNOSIS — Z0100 Encounter for examination of eyes and vision without abnormal findings: Secondary | ICD-10-CM | POA: Diagnosis not present

## 2016-01-29 ENCOUNTER — Ambulatory Visit (INDEPENDENT_AMBULATORY_CARE_PROVIDER_SITE_OTHER): Payer: Medicare Other

## 2016-01-29 DIAGNOSIS — E538 Deficiency of other specified B group vitamins: Secondary | ICD-10-CM | POA: Diagnosis not present

## 2016-01-29 MED ORDER — CYANOCOBALAMIN 1000 MCG/ML IJ SOLN
1000.0000 ug | Freq: Once | INTRAMUSCULAR | Status: AC
  Administered 2016-01-29: 1000 ug via INTRAMUSCULAR

## 2016-03-05 ENCOUNTER — Ambulatory Visit (INDEPENDENT_AMBULATORY_CARE_PROVIDER_SITE_OTHER): Payer: Medicare Other

## 2016-03-05 DIAGNOSIS — E538 Deficiency of other specified B group vitamins: Secondary | ICD-10-CM | POA: Diagnosis not present

## 2016-03-05 MED ORDER — CYANOCOBALAMIN 1000 MCG/ML IJ SOLN
1000.0000 ug | Freq: Once | INTRAMUSCULAR | Status: AC
  Administered 2016-03-05: 1000 ug via INTRAMUSCULAR

## 2016-03-24 HISTORY — PX: EYE SURGERY: SHX253

## 2016-04-07 DIAGNOSIS — H04123 Dry eye syndrome of bilateral lacrimal glands: Secondary | ICD-10-CM | POA: Diagnosis not present

## 2016-04-07 DIAGNOSIS — H2513 Age-related nuclear cataract, bilateral: Secondary | ICD-10-CM | POA: Diagnosis not present

## 2016-04-07 DIAGNOSIS — H43813 Vitreous degeneration, bilateral: Secondary | ICD-10-CM | POA: Diagnosis not present

## 2016-04-08 ENCOUNTER — Ambulatory Visit (INDEPENDENT_AMBULATORY_CARE_PROVIDER_SITE_OTHER): Payer: Medicare Other | Admitting: *Deleted

## 2016-04-08 DIAGNOSIS — E538 Deficiency of other specified B group vitamins: Secondary | ICD-10-CM

## 2016-04-08 MED ORDER — CYANOCOBALAMIN 1000 MCG/ML IJ SOLN
1000.0000 ug | Freq: Once | INTRAMUSCULAR | Status: AC
  Administered 2016-04-08: 1000 ug via INTRAMUSCULAR

## 2016-04-24 DIAGNOSIS — H25012 Cortical age-related cataract, left eye: Secondary | ICD-10-CM | POA: Diagnosis not present

## 2016-04-24 DIAGNOSIS — H2512 Age-related nuclear cataract, left eye: Secondary | ICD-10-CM | POA: Diagnosis not present

## 2016-04-24 DIAGNOSIS — H25812 Combined forms of age-related cataract, left eye: Secondary | ICD-10-CM | POA: Diagnosis not present

## 2016-05-08 DIAGNOSIS — H25811 Combined forms of age-related cataract, right eye: Secondary | ICD-10-CM | POA: Diagnosis not present

## 2016-05-08 DIAGNOSIS — H2511 Age-related nuclear cataract, right eye: Secondary | ICD-10-CM | POA: Diagnosis not present

## 2016-05-08 DIAGNOSIS — H52221 Regular astigmatism, right eye: Secondary | ICD-10-CM | POA: Diagnosis not present

## 2016-05-08 DIAGNOSIS — H25011 Cortical age-related cataract, right eye: Secondary | ICD-10-CM | POA: Diagnosis not present

## 2016-05-14 ENCOUNTER — Ambulatory Visit (INDEPENDENT_AMBULATORY_CARE_PROVIDER_SITE_OTHER): Payer: Medicare Other

## 2016-05-14 DIAGNOSIS — E538 Deficiency of other specified B group vitamins: Secondary | ICD-10-CM

## 2016-05-14 MED ORDER — CYANOCOBALAMIN 1000 MCG/ML IJ SOLN
1000.0000 ug | Freq: Once | INTRAMUSCULAR | Status: AC
  Administered 2016-05-14: 1000 ug via INTRAMUSCULAR

## 2016-05-19 ENCOUNTER — Encounter: Payer: Self-pay | Admitting: Internal Medicine

## 2016-05-19 ENCOUNTER — Telehealth: Payer: Self-pay | Admitting: Family Medicine

## 2016-05-19 ENCOUNTER — Ambulatory Visit (INDEPENDENT_AMBULATORY_CARE_PROVIDER_SITE_OTHER): Payer: Medicare Other | Admitting: Internal Medicine

## 2016-05-19 VITALS — BP 162/56 | HR 71 | Temp 98.7°F | Wt 155.0 lb

## 2016-05-19 DIAGNOSIS — I119 Hypertensive heart disease without heart failure: Secondary | ICD-10-CM | POA: Diagnosis not present

## 2016-05-19 DIAGNOSIS — R51 Headache: Secondary | ICD-10-CM | POA: Diagnosis not present

## 2016-05-19 DIAGNOSIS — R519 Headache, unspecified: Secondary | ICD-10-CM | POA: Insufficient documentation

## 2016-05-19 NOTE — Telephone Encounter (Signed)
Will evaluate at OV 

## 2016-05-19 NOTE — Progress Notes (Signed)
Pre visit review using our clinic review tool, if applicable. No additional management support is needed unless otherwise documented below in the visit note. 

## 2016-05-19 NOTE — Telephone Encounter (Signed)
[facility]  Patient Name: Priscilla [name]  DOB: 03/30/1930    Initial Comment Caller states Friday, having headaches hurting her really bad, head is sore. Nothing really seeming to help her, until she took a pain pill in order to rest. BP: 110/36, same day: 191/50, and fluctuating.    Nurse Assessment  Nurse: Wynetta Emery, RN, Baker Janus Date/Time Eilene Ghazi Time): 05/19/2016 8:29:08 AM  Confirm and document reason for call. If symptomatic, describe symptoms. ---Glenise is having really bad pain in back of her head that it spreads to entire back of her head and her head is sore. onset 4 days blood pressure fluctuates  Does the patient have any new or worsening symptoms? ---Yes  Will a triage be completed? ---Yes  Related visit to physician within the last 2 weeks? ---No  Does the PT have any chronic conditions? (i.e. diabetes, asthma, etc.) ---No  Is this a behavioral health or substance abuse call? ---No     Guidelines    Guideline Title Affirmed Question Affirmed Notes  Headache [1] SEVERE headache (e.g., excruciating) AND [2] "worst headache" of life    Final Disposition User   Go to ED Now (or PCP triage) Wynetta Emery, RN, Baker Janus    Comments  NOTE; Appt with Dr. Wilhemena Durie at 430pm 05-09-2016 unable to schedule with Dr. Biagio Borg no availability   Referrals  GO TO FACILITY UNDECIDED   Disagree/Comply: Comply

## 2016-05-19 NOTE — Telephone Encounter (Signed)
Pt has appt today at 4:30 with Dr Silvio Pate.

## 2016-05-19 NOTE — Assessment & Plan Note (Addendum)
BP Readings from Last 3 Encounters:  05/19/16 (!) 162/56  11/27/15 (!) 142/58  09/21/15 (!) 148/58   Not clear if BP up due to pain or vice versa Ongoing low diastolics so I am reluctant to increase meds Seeing Dr Radford Pax in 2 weeks also

## 2016-05-19 NOTE — Assessment & Plan Note (Addendum)
Not really a headache--more like head pain Neck moves well but still could be muscular No temporal findings to suggest vasculitis Brief trouble speaking when pain worst---but unlikely TIA Better now than at her worst  Discussed tylenol for now Worth trying heat again--may have helped Recheck if new symptoms

## 2016-05-20 NOTE — Progress Notes (Signed)
Subjective:    Patient ID: Priscilla [name], female    DOB: [birth year], 83 y.o.   MRN: [id number]  HPI  HPI Here with daughter  Having pain in back of head for 4 days--- really bad 2 days Pain is better today but now sore to touch No trauma ---"it doesn't feel like that kind of hurt" Feels like muscle  Intermittent--- pulsing type pain No pain or tenderness in temples---though some of the headache comes around to them No redness or swelling Started when BP was high---checked it when head didn't feel right 191/60 on the first day. Now [id number]---- diastolics in [id number] and 0000000  Has tried tylenol --no clear help Goody's powder once--didn't help either  Current Outpatient Prescriptions on File Prior to Visit  Medication Sig Dispense Refill  . aspirin 81 MG chewable tablet Chew 81 mg by mouth daily.    Marland Kitchen atenolol (TENORMIN) 50 MG tablet Take 1 tablet (50 mg total) by mouth 2 (two) times daily. 180 tablet 3  . Biotin 5 MG CAPS Take 1 capsule by mouth daily.    . Calcium Carb-Cholecalciferol (CALCIUM-VITAMIN D) 600-400 MG-UNIT TABS Take 1 tablet by mouth daily.    . cholecalciferol (VITAMIN D) 1000 UNITS tablet Take 2,000 Units by mouth daily.     . cyanocobalamin (,VITAMIN B-12,) 1000 MCG/ML injection Inject 1 mL (1,000 mcg total) into the muscle every 30 (thirty) days. Start 03/2014 1 mL 0  . ferrous sulfate 325 (65 FE) MG tablet Take 325 mg by mouth daily with breakfast.    . losartan-hydrochlorothiazide (HYZAAR) 100-12.5 MG tablet Take 1 tablet by mouth daily. 90 tablet 3  . nitroGLYCERIN (NITROSTAT) 0.4 MG SL tablet Place 1 tablet (0.4 mg total) under the tongue every 5 (five) minutes as needed for chest pain. 25 tablet 1  . [DISCONTINUED] CALCIUM-VITAMIN D PO Take 1 tablet by mouth 2 (two) times daily.     No current facility-administered medications on file prior to visit.     Allergies  Allergen Reactions  . Morphine And Related Nausea And Vomiting  . Lisinopril Cough     Past Medical History:  Diagnosis Date  . B12 deficiency 03/25/2014   H/o small bowel obstruction with resection as well as colorectal cancer s/p partial colectomy.  Receives B12 injections   . Cancer ([place]) 2001, 2008   colon, rectal  . Chest pain, atypical   . Heart murmur   . History of chicken pox   . History of Clostridium difficile 2012  . History of DVT (deep vein thrombosis) 2002   right leg, after chemo for first cancer, treated  . Hypertension    dr Mare Ferrari  . IDA (iron deficiency anemia)   . Moderate aortic insufficiency   . Open wound of abdominal wall with complication   . Osteoarthritis   . Osteopenia 09/2011   DEXA 2013 spine -2.2, femur -1.6, DEXA 12/2015 - hip T score -1.9  . SBO (small bowel obstruction) 02/05/2011   On 02/05/11 she had enterolysis and small bowel resection     Past Surgical History:  Procedure Laterality Date  . ABDOMINAL HYSTERECTOMY  1984  . APPENDECTOMY  1984  . BOWEL RESECTION  02/05/2011   Procedure: SMALL BOWEL RESECTION;  Surgeon: Harl Bowie, MD;  Location: [place];  Service: General;  Laterality: N/A;  . broken nose    . CARDIOVASCULAR STRESS TEST  11/2015   WNL ([facility])  . COLECTOMY    . COLON SURGERY  2001 and  2008  . COLONOSCOPY  12/2012   erosions at colonic anastomosis, o/w normal rpt 5 yrs (Dr. Penelope Coop at [place])  . COLOSTOMY    . DEXA  09/2011   T-score: spine -2.2, femur -1.6  . ESOPHAGOGASTRODUODENOSCOPY  01/31/2011   Procedure: ESOPHAGOGASTRODUODENOSCOPY (EGD);  Surgeon: Missy Sabins, MD;  Location: [facility] ENDOSCOPY;  Service: Endoscopy;  Laterality: N/A;  duodenal bx;s r/o giardia, r/o celiac diease  . FLEXIBLE SIGMOIDOSCOPY  01/31/2011   Procedure: FLEXIBLE SIGMOIDOSCOPY;  Surgeon: Missy Sabins, MD;  Location: [place];  Service: Endoscopy;  Laterality: N/A;  . ILEOSTOMY    . JOINT REPLACEMENT  2010   knee   . LAPAROTOMY  02/05/2011   Procedure: EXPLORATORY LAPAROTOMY;  Surgeon: Harl Bowie, MD;   Location: [place];  Service: General;  Laterality: N/A;  lysis of adhesions.  . NUCLEAR STRESS TEST  11/17/08   NORMAL  . REPLACEMENT TOTAL KNEE  2010   RIGHT KNEE  . WOUND DEBRIDEMENT N/A 05/21/2012   Procedure: Excision non healing ABDOMINAL WOUND;  Surgeon: Harl Bowie, MD;  Location: [facility];  Service: General;  Laterality: N/A;    Family History  Problem Relation Age of Onset  . Heart disease Mother     valve problems  . Heart attack Mother   . Congestive Heart Failure Mother   . Heart disease Father     due to lupus  . Lupus Father   . Heart attack Father   . Colon cancer Paternal Grandmother   . Coronary artery disease Brother     s/p CABG  . Coronary artery disease Sister     s/p CABG  . Heart attack Brother   . Heart attack Sister   . Diabetes Neg Hx   . Stroke Neg Hx   . Hypertension Neg Hx     Social History   Social History  . Marital status: Widowed    Spouse name: N/A  . Number of children: N/A  . Years of education: N/A   Occupational History  . Not on file.   Social History Main Topics  . Smoking status: Never Smoker  . Smokeless tobacco: Never Used  . Alcohol use No  . Drug use: No  . Sexual activity: Not on file   Other Topics Concern  . Not on file   Social History Narrative   Caffeine: decaf   Lives alone, daughter lives nearby to visit.   Occupation: retired, worked at [employer] office   Edu: HS   Activity: tries to get walking, walks up and down steps at home   Diet: good water, fruits/vegetables daily.  1 cup milk/day, cheese.      Cards: Dr. Mare Ferrari   Surgeon: Dr. Rush Farmer   Review of Systems Both cataracts done in the past 4 weeks Appetite is fine. No jaw claudication No fever No vision change Did have brief spell having trouble getting words out--2 days ago (?minutes)    Objective:   Physical Exam  Constitutional: She is oriented to person, place, and time. She appears well-nourished. No distress.  HENT:  Some  tenderness in occiput but no redness, bogginess or anatomic abnormalities No temporal bruit or tenderness No oral lesions  Eyes: EOM are normal. Pupils are equal, round, and reactive to light.  Neck:  Fairly normal ROM No pain with full active movement No nodes or mass  Cardiovascular: Normal rate and regular rhythm.  Exam reveals no gallop.   Faint early diastolic murmur at  base  Pulmonary/Chest: Effort normal and breath sounds normal. No respiratory distress.  Neurological: She is alert and oriented to person, place, and time. No cranial nerve deficit. She exhibits normal muscle tone.          Assessment & Plan:    Review of Systems     Objective:   Physical Exam        Assessment & Plan:

## 2016-06-03 ENCOUNTER — Ambulatory Visit: Payer: Medicare Other | Admitting: Cardiology

## 2016-06-05 ENCOUNTER — Ambulatory Visit (INDEPENDENT_AMBULATORY_CARE_PROVIDER_SITE_OTHER): Payer: Medicare Other | Admitting: Cardiology

## 2016-06-05 ENCOUNTER — Encounter (INDEPENDENT_AMBULATORY_CARE_PROVIDER_SITE_OTHER): Payer: Self-pay

## 2016-06-05 ENCOUNTER — Encounter: Payer: Self-pay | Admitting: Cardiology

## 2016-06-05 VITALS — BP 140/58 | HR 60 | Ht 61.0 in | Wt 154.1 lb

## 2016-06-05 DIAGNOSIS — I351 Nonrheumatic aortic (valve) insufficiency: Secondary | ICD-10-CM | POA: Diagnosis not present

## 2016-06-05 DIAGNOSIS — R0789 Other chest pain: Secondary | ICD-10-CM

## 2016-06-05 DIAGNOSIS — I119 Hypertensive heart disease without heart failure: Secondary | ICD-10-CM

## 2016-06-05 MED ORDER — ATENOLOL 50 MG PO TABS: 50.0000 mg | ORAL_TABLET | Freq: Two times a day (BID) | ORAL | 3 refills | Status: DC

## 2016-06-05 NOTE — Progress Notes (Signed)
Cardiology Office Note    Date:  06/05/2016   ID:  Danaya Geddis, DOB [birth year], MRN [id number]  PCP:  Ria Bush, MD  Cardiologist:  Fransico Him, MD   Chief Complaint  Patient presents with  . Aortic Insuffiency  . Hypertension    History of Present Illness:  Kevona Lupinacci is a 83 y.o. female with a history of essential hypertension, moderate aortic insufficiency with mild mitral regurgitation.  She is here today for followup and is doing well.  When I saw her last she was having some atypical chest pain and nuclear stress test showed no ischemia.  She denies any LE edema, dizziness, palpitations or syncope. She has chronic DOE when working outside which she thinks is stable.     Past Medical History:  Diagnosis Date  . Aortic insufficiency    mild to moderate on echo 2017  . B12 deficiency 03/25/2014   H/o small bowel obstruction with resection as well as colorectal cancer s/p partial colectomy.  Receives B12 injections   . Cancer ([place]) 2001, 2008   colon, rectal  . Chest pain, atypical    no ischemia on nuclear stress test 2017  . History of chicken pox   . History of Clostridium difficile 2012  . History of DVT (deep vein thrombosis) 2002   right leg, after chemo for first cancer, treated  . Hypertension   . IDA (iron deficiency anemia)   . Open wound of abdominal wall with complication   . Osteoarthritis   . Osteopenia 09/2011   DEXA 2013 spine -2.2, femur -1.6, DEXA 12/2015 - hip T score -1.9  . SBO (small bowel obstruction) 02/05/2011   On 02/05/11 she had enterolysis and small bowel resection     Past Surgical History:  Procedure Laterality Date  . ABDOMINAL HYSTERECTOMY  1984  . APPENDECTOMY  1984  . BOWEL RESECTION  02/05/2011   Procedure: SMALL BOWEL RESECTION;  Surgeon: Harl Bowie, MD;  Location: [facility];  Service: General;  Laterality: N/A;  . broken nose    . CARDIOVASCULAR STRESS TEST  11/2015   WNL ([facility])  . COLECTOMY    . COLON  SURGERY  2001 and 2008  . COLONOSCOPY  12/2012   erosions at colonic anastomosis, o/w normal rpt 5 yrs (Dr. Penelope Coop at [place])  . COLOSTOMY    . DEXA  09/2011   T-score: spine -2.2, femur -1.6  . ESOPHAGOGASTRODUODENOSCOPY  01/31/2011   Procedure: ESOPHAGOGASTRODUODENOSCOPY (EGD);  Surgeon: Missy Sabins, MD;  Location: [facility] ENDOSCOPY;  Service: Endoscopy;  Laterality: N/A;  duodenal bx;s r/o giardia, r/o celiac diease  . FLEXIBLE SIGMOIDOSCOPY  01/31/2011   Procedure: FLEXIBLE SIGMOIDOSCOPY;  Surgeon: Missy Sabins, MD;  Location: [facility];  Service: Endoscopy;  Laterality: N/A;  . ILEOSTOMY    . JOINT REPLACEMENT  2010   knee   . LAPAROTOMY  02/05/2011   Procedure: EXPLORATORY LAPAROTOMY;  Surgeon: Harl Bowie, MD;  Location: [facility];  Service: General;  Laterality: N/A;  lysis of adhesions.  . NUCLEAR STRESS TEST  11/17/08   NORMAL  . REPLACEMENT TOTAL KNEE  2010   RIGHT KNEE  . WOUND DEBRIDEMENT N/A 05/21/2012   Procedure: Excision non healing ABDOMINAL WOUND;  Surgeon: Harl Bowie, MD;  Location: [facility];  Service: General;  Laterality: N/A;    Current Medications: No outpatient prescriptions have been marked as taking for the 06/05/16 encounter (Office Visit) with Sueanne Margarita, MD.    Allergies:  Morphine and related and Lisinopril   Social History   Social History  . Marital status: Widowed    Spouse name: N/A  . Number of children: N/A  . Years of education: N/A   Social History Main Topics  . Smoking status: Never Smoker  . Smokeless tobacco: Never Used  . Alcohol use No  . Drug use: No  . Sexual activity: Not Asked   Other Topics Concern  . None   Social History Narrative   Caffeine: decaf   Lives alone, daughter lives nearby to visit.   Occupation: retired, worked at [employer] office   Edu: HS   Activity: tries to get walking, walks up and down steps at home   Diet: good water, fruits/vegetables daily.  1 cup milk/day, cheese.      Cards: Dr.  Mare Ferrari   Surgeon: Dr. Rush Farmer     Family History:  The patient's family history includes Colon cancer in her paternal grandmother; Congestive Heart Failure in her mother; Coronary artery disease in her brother and sister; Heart attack in her brother, father, mother, and sister; Heart disease in her father and mother; Lupus in her father.   ROS:   Please see the history of present illness.    ROS All other systems reviewed and are negative.  No flowsheet data found.     PHYSICAL EXAM:   VS:  BP (!) 140/58   Pulse 60   Ht 5\' 1"  (1.549 m)   Wt 154 lb 1.9 oz (69.9 kg)   BMI 29.12 kg/m    GEN: Well nourished, well developed, in no acute distress  HEENT: normal  Neck: no JVD, carotid bruits, or masses Cardiac: RRR; no murmurs, rubs, or gallops,no edema.  Intact distal pulses bilaterally.  Respiratory:  clear to auscultation bilaterally, normal work of breathing GI: soft, nontender, nondistended, + BS MS: no deformity or atrophy  Skin: warm and dry, no rash Neuro:  Alert and Oriented x 3, Strength and sensation are intact Psych: euthymic mood, full affect  Wt Readings from Last 3 Encounters:  06/05/16 154 lb 1.9 oz (69.9 kg)  05/19/16 155 lb (70.3 kg)  11/27/15 156 lb 6.4 oz (70.9 kg)      Studies/Labs Reviewed:   EKG:  EKG is ordered today showing NSR at 60bpm with first degree AV block with no ST changes  Recent Labs: 09/13/2015: BUN 19; Creat 0.99; Hemoglobin 11.1; Platelets 275; Potassium 4.8; Sodium 136   Lipid Panel    Component Value Date/Time   CHOL 171 09/12/2014 0838   TRIG 137.0 09/12/2014 0838   HDL 70.70 09/12/2014 0838   CHOLHDL 2 09/12/2014 0838   VLDL 27.4 09/12/2014 0838   LDLCALC 73 09/12/2014 0838   LDLDIRECT 101.7 12/12/2010 0846    Additional studies/ records that were reviewed today include:  none    ASSESSMENT:    1. Aortic valve insufficiency, etiology of cardiac valve disease unspecified   2. Benign hypertensive heart disease  without heart failure   3. Atypical chest pain      PLAN:  In order of problems listed above:  1. Mild to moderate AI by echo 11/2015 .  2. HTN - Her BP is well ontrolled on current meds.  She will continue ARB/diuretic/Atenolol.  3.   Atypical CP - she has a history of CP in the past with no ischemia on stress test 2010 and repeat nuclear stress test 11/2015 showed no ischemia.        Medication  Adjustments/Labs and Tests Ordered: Current medicines are reviewed at length with the patient today.  Concerns regarding medicines are outlined above.  Medication changes, Labs and Tests ordered today are listed in the Patient Instructions below.  There are no Patient Instructions on file for this visit.   Signed, Fransico Him, MD  06/05/2016 9:26 AM    [facility] [place], [place], [place]  [address] Phone: [phone]; Fax: [phone]

## 2016-06-05 NOTE — Patient Instructions (Signed)

## 2016-06-13 DIAGNOSIS — H35371 Puckering of macula, right eye: Secondary | ICD-10-CM | POA: Diagnosis not present

## 2016-06-18 ENCOUNTER — Ambulatory Visit (INDEPENDENT_AMBULATORY_CARE_PROVIDER_SITE_OTHER): Payer: Medicare Other

## 2016-06-18 DIAGNOSIS — E538 Deficiency of other specified B group vitamins: Secondary | ICD-10-CM | POA: Diagnosis not present

## 2016-06-18 MED ORDER — CYANOCOBALAMIN 1000 MCG/ML IJ SOLN
1000.0000 ug | Freq: Once | INTRAMUSCULAR | Status: AC
  Administered 2016-06-18: 1000 ug via INTRAMUSCULAR

## 2016-07-23 ENCOUNTER — Ambulatory Visit: Payer: Medicare Other

## 2016-07-24 ENCOUNTER — Ambulatory Visit (INDEPENDENT_AMBULATORY_CARE_PROVIDER_SITE_OTHER): Payer: Medicare Other

## 2016-07-24 DIAGNOSIS — E538 Deficiency of other specified B group vitamins: Secondary | ICD-10-CM | POA: Diagnosis not present

## 2016-07-24 MED ORDER — CYANOCOBALAMIN 1000 MCG/ML IJ SOLN
1000.0000 ug | Freq: Once | INTRAMUSCULAR | Status: AC
  Administered 2016-07-24: 1000 ug via INTRAMUSCULAR

## 2016-08-27 ENCOUNTER — Ambulatory Visit (INDEPENDENT_AMBULATORY_CARE_PROVIDER_SITE_OTHER): Payer: Medicare Other

## 2016-08-27 DIAGNOSIS — E538 Deficiency of other specified B group vitamins: Secondary | ICD-10-CM | POA: Diagnosis not present

## 2016-08-27 MED ORDER — CYANOCOBALAMIN 1000 MCG/ML IJ SOLN
1000.0000 ug | Freq: Once | INTRAMUSCULAR | Status: AC
  Administered 2016-08-27: 1000 ug via INTRAMUSCULAR

## 2016-09-21 ENCOUNTER — Other Ambulatory Visit: Payer: Self-pay | Admitting: Family Medicine

## 2016-09-21 DIAGNOSIS — E538 Deficiency of other specified B group vitamins: Secondary | ICD-10-CM

## 2016-09-21 DIAGNOSIS — I119 Hypertensive heart disease without heart failure: Secondary | ICD-10-CM

## 2016-09-21 DIAGNOSIS — D539 Nutritional anemia, unspecified: Secondary | ICD-10-CM

## 2016-09-21 DIAGNOSIS — E559 Vitamin D deficiency, unspecified: Secondary | ICD-10-CM

## 2016-09-22 ENCOUNTER — Ambulatory Visit: Payer: Medicare Other

## 2016-09-22 ENCOUNTER — Other Ambulatory Visit (INDEPENDENT_AMBULATORY_CARE_PROVIDER_SITE_OTHER): Payer: Medicare Other

## 2016-09-22 DIAGNOSIS — D539 Nutritional anemia, unspecified: Secondary | ICD-10-CM

## 2016-09-22 DIAGNOSIS — I119 Hypertensive heart disease without heart failure: Secondary | ICD-10-CM | POA: Diagnosis not present

## 2016-09-22 DIAGNOSIS — E559 Vitamin D deficiency, unspecified: Secondary | ICD-10-CM | POA: Diagnosis not present

## 2016-09-22 DIAGNOSIS — E538 Deficiency of other specified B group vitamins: Secondary | ICD-10-CM | POA: Diagnosis not present

## 2016-09-22 LAB — CBC WITH DIFFERENTIAL/PLATELET
BASOS ABS: 76 {cells}/uL (ref 0–200)
Basophils Relative: 2 %
Eosinophils Absolute: 114 cells/uL (ref 15–500)
Eosinophils Relative: 3 %
HEMATOCRIT: 35.3 % (ref 35.0–45.0)
HEMOGLOBIN: 11.5 g/dL — AB (ref 11.7–15.5)
LYMPHS ABS: 950 {cells}/uL (ref 850–3900)
LYMPHS PCT: 25 %
MCH: 29.8 pg (ref 27.0–33.0)
MCHC: 32.6 g/dL (ref 32.0–36.0)
MCV: 91.5 fL (ref 80.0–100.0)
MONO ABS: 456 {cells}/uL (ref 200–950)
MPV: 9 fL (ref 7.5–12.5)
Monocytes Relative: 12 %
NEUTROS PCT: 58 %
Neutro Abs: 2204 cells/uL (ref 1500–7800)
Platelets: 286 10*3/uL (ref 140–400)
RBC: 3.86 MIL/uL (ref 3.80–5.10)
RDW: 13.4 % (ref 11.0–15.0)
WBC: 3.8 10*3/uL (ref 3.8–10.8)

## 2016-09-22 LAB — BASIC METABOLIC PANEL
BUN: 17 mg/dL (ref 7–25)
CO2: 24 mmol/L (ref 20–31)
CREATININE: 0.92 mg/dL — AB (ref 0.60–0.88)
Calcium: 9.5 mg/dL (ref 8.6–10.4)
Chloride: 102 mmol/L (ref 98–110)
Glucose, Bld: 99 mg/dL (ref 65–99)
Potassium: 4.7 mmol/L (ref 3.5–5.3)
SODIUM: 136 mmol/L (ref 135–146)

## 2016-09-22 LAB — IRON AND TIBC
%SAT: 22 % (ref 11–50)
Iron: 78 ug/dL (ref 45–160)
TIBC: 352 ug/dL (ref 250–450)
UIBC: 274 ug/dL

## 2016-09-23 LAB — VITAMIN B12: Vitamin B-12: 475 pg/mL (ref 200–1100)

## 2016-09-23 LAB — FERRITIN: Ferritin: 52 ng/mL (ref 20–288)

## 2016-09-23 LAB — VITAMIN D 25 HYDROXY (VIT D DEFICIENCY, FRACTURES): Vit D, 25-Hydroxy: 38 ng/mL (ref 30–100)

## 2016-09-29 ENCOUNTER — Ambulatory Visit (INDEPENDENT_AMBULATORY_CARE_PROVIDER_SITE_OTHER): Payer: Medicare Other | Admitting: Family Medicine

## 2016-09-29 ENCOUNTER — Encounter: Payer: Self-pay | Admitting: Family Medicine

## 2016-09-29 VITALS — BP 150/60 | HR 60 | Temp 98.5°F | Ht 61.0 in | Wt 151.0 lb

## 2016-09-29 DIAGNOSIS — I119 Hypertensive heart disease without heart failure: Secondary | ICD-10-CM | POA: Diagnosis not present

## 2016-09-29 DIAGNOSIS — E559 Vitamin D deficiency, unspecified: Secondary | ICD-10-CM

## 2016-09-29 DIAGNOSIS — D539 Nutritional anemia, unspecified: Secondary | ICD-10-CM

## 2016-09-29 DIAGNOSIS — Z0000 Encounter for general adult medical examination without abnormal findings: Secondary | ICD-10-CM | POA: Diagnosis not present

## 2016-09-29 DIAGNOSIS — M159 Polyosteoarthritis, unspecified: Secondary | ICD-10-CM

## 2016-09-29 DIAGNOSIS — E538 Deficiency of other specified B group vitamins: Secondary | ICD-10-CM

## 2016-09-29 DIAGNOSIS — M8580 Other specified disorders of bone density and structure, unspecified site: Secondary | ICD-10-CM | POA: Diagnosis not present

## 2016-09-29 DIAGNOSIS — Z7189 Other specified counseling: Secondary | ICD-10-CM

## 2016-09-29 DIAGNOSIS — M150 Primary generalized (osteo)arthritis: Secondary | ICD-10-CM | POA: Diagnosis not present

## 2016-09-29 MED ORDER — CYANOCOBALAMIN 1000 MCG/ML IJ SOLN
1000.0000 ug | Freq: Once | INTRAMUSCULAR | Status: AC
  Administered 2016-09-29: 1000 ug via INTRAMUSCULAR

## 2016-09-29 NOTE — Progress Notes (Signed)
BP (!) 150/60   Pulse 60   Temp 98.5 F (36.9 C) (Oral)   Ht '5\' 1"'$  (1.549 m)   Wt 151 lb (68.5 kg)   SpO2 97%   BMI 28.53 kg/m   On repeat testing 160/60  CC: medicare wellness visit Subjective:    Patient ID: Priscilla [name], female    DOB: September 30, 1930, 83 y.o.   MRN: [id number]  HPI: Priscilla [name] is a 83 y.o. female presenting on 09/29/2016 for Annual Exam (Medicare)   Has been receiving b12 shots regularly since 03/2014.  Endorses normal to low blood pressure readings at home.   1 fall in last year - walking down to den while carrying Christmas supplies. missed step. No injury. No dizziness.  Denies depression, sadness, anhedonia.   Hearing Screening   '125Hz'$  '250Hz'$  '500Hz'$  '1000Hz'$  '2000Hz'$  '3000Hz'$  '4000Hz'$  '6000Hz'$  '8000Hz'$   Right ear:   40 40 40  40    Left ear:   40 40 40  40    Vision Screening Comments: Had cataract surgery February 2018   Preventative: H/o colon and rectal cancer. H/o bowel resection after SBO. Sees Dr Amedeo Plenty GI.  COLONOSCOPY Date: 12/2012 erosions at colonic anastomosis, o/w normal rpt 5 yrs (Dr. Penelope Coop at [facility]). Well woman - has aged out of pap smears.  Mammogram - 12/2015 WNL. Done at [facility]. Pt may be interested in spacing out.  DEXA 12/2015 - hip T score -1.9  Flu - yearly Td - 08/2011  peumovax 2009. prevnar 08/2013 zostavax 04/2014 shingrix - discussed.  Advanced directives - has living will at home. HCPOA would be son Clare Gandy) and daughter Lelan Pons), also would be ok with other children helping. Ok with CPR, intubation and limited trial for reversible conditions, does not want prolonged life support if terminal condition. Brings copy today 09/21/2015 Seat belt use discussed Sunscreen use discussed. No changing moles on skin. Non smoker Alcohol - none  Caffeine: decaf  Lives alone, daughter lives nearby to visit. Widow for 16 yrs  Occupation: retired, worked at [employer] office  Edu: HS  Activity: yardwork, walks up and down steps at home  Diet: good  water, fruits/vegetables daily. 1 cup milk/day, cheese   Relevant past medical, surgical, family and social history reviewed and updated as indicated. Interim medical history since our last visit reviewed. Allergies and medications reviewed and updated. Outpatient Medications Prior to Visit  Medication Sig Dispense Refill  . aspirin 81 MG chewable tablet Chew 81 mg by mouth daily.    Marland Kitchen atenolol (TENORMIN) 50 MG tablet Take 1 tablet (50 mg total) by mouth 2 (two) times daily. 180 tablet 3  . Biotin 5 MG CAPS Take 1 capsule by mouth daily.    . Calcium Carb-Cholecalciferol (CALCIUM-VITAMIN D) 600-400 MG-UNIT TABS Take 1 tablet by mouth daily.    . cholecalciferol (VITAMIN D) 1000 UNITS tablet Take 2,000 Units by mouth daily.     . cyanocobalamin (,VITAMIN B-12,) 1000 MCG/ML injection Inject 1 mL (1,000 mcg total) into the muscle every 30 (thirty) days. Start 03/2014 1 mL 0  . ferrous sulfate 325 (65 FE) MG tablet Take 325 mg by mouth daily with breakfast.    . losartan-hydrochlorothiazide (HYZAAR) 100-12.5 MG tablet Take 1 tablet by mouth daily. 90 tablet 3  . nitroGLYCERIN (NITROSTAT) 0.4 MG SL tablet Place 1 tablet (0.4 mg total) under the tongue every 5 (five) minutes as needed for chest pain. 25 tablet 1   No facility-administered medications prior to visit.  Per HPI unless specifically indicated in ROS section below Review of Systems     Objective:    BP (!) 150/60   Pulse 60   Temp 98.5 F (36.9 C) (Oral)   Ht '5\' 1"'$  (1.549 m)   Wt 151 lb (68.5 kg)   SpO2 97%   BMI 28.53 kg/m   Wt Readings from Last 3 Encounters:  09/29/16 151 lb (68.5 kg)  06/05/16 154 lb 1.9 oz (69.9 kg)  05/19/16 155 lb (70.3 kg)    Physical Exam  Constitutional: She is oriented to person, place, and time. She appears well-developed and well-nourished. No distress.  HENT:  Head: Normocephalic and atraumatic.  Right Ear: Hearing, tympanic membrane, external ear and ear canal normal.  Left Ear:  Hearing, tympanic membrane, external ear and ear canal normal.  Nose: Nose normal.  Mouth/Throat: Uvula is midline, oropharynx is clear and moist and mucous membranes are normal. No oropharyngeal exudate, posterior oropharyngeal edema or posterior oropharyngeal erythema.  Eyes: Conjunctivae and EOM are normal. Pupils are equal, round, and reactive to light. No scleral icterus.  Neck: Normal range of motion. Neck supple. Carotid bruit is not present. No thyromegaly present.  Cardiovascular: Normal rate, regular rhythm, normal heart sounds and intact distal pulses.   No murmur heard. Pulses:      Radial pulses are 2+ on the right side, and 2+ on the left side.  Murmur not appreciated today  Pulmonary/Chest: Effort normal and breath sounds normal. No respiratory distress. She has no wheezes. She has no rales.  Abdominal: Soft. Bowel sounds are normal. She exhibits no distension and no mass. There is no tenderness. There is no rebound and no guarding.  Musculoskeletal: Normal range of motion. She exhibits no edema.  Lymphadenopathy:    She has no cervical adenopathy.  Neurological: She is alert and oriented to person, place, and time.  CN grossly intact, station and gait intact Recall 1/3, 3/3 with cues Calculation 5/5 serial 7s  Skin: Skin is warm and dry. No rash noted.  Psychiatric: She has a normal mood and affect. Her behavior is normal. Judgment and thought content normal.  Nursing note and vitals reviewed.  Results for orders placed or performed in visit on 09/22/16  Iron and TIBC  Result Value Ref Range   Iron 78 45 - 160 ug/dL   UIBC 274 ug/dL   TIBC 352 250 - 450 ug/dL   %SAT 22 11 - 50 %  Basic metabolic panel  Result Value Ref Range   Sodium 136 135 - 146 mmol/L   Potassium 4.7 3.5 - 5.3 mmol/L   Chloride 102 98 - 110 mmol/L   CO2 24 20 - 31 mmol/L   Glucose, Bld 99 65 - 99 mg/dL   BUN 17 7 - 25 mg/dL   Creat 0.92 (H) 0.60 - 0.88 mg/dL   Calcium 9.5 8.6 - 10.4 mg/dL    Vitamin B12  Result Value Ref Range   Vitamin B-12 475 200 - 1,100 pg/mL  CBC with Differential/Platelet  Result Value Ref Range   WBC 3.8 3.8 - 10.8 K/uL   RBC 3.86 3.80 - 5.10 MIL/uL   Hemoglobin 11.5 (L) 11.7 - 15.5 g/dL   HCT 35.3 35.0 - 45.0 %   MCV 91.5 80.0 - 100.0 fL   MCH 29.8 27.0 - 33.0 pg   MCHC 32.6 32.0 - 36.0 g/dL   RDW 13.4 11.0 - 15.0 %   Platelets 286 140 - 400 K/uL  MPV 9.0 7.5 - 12.5 fL   Neutro Abs 2,204 1,500 - 7,800 cells/uL   Lymphs Abs 950 850 - 3,900 cells/uL   Monocytes Absolute 456 200 - 950 cells/uL   Eosinophils Absolute 114 15 - 500 cells/uL   Basophils Absolute 76 0 - 200 cells/uL   Neutrophils Relative % 58 %   Lymphocytes Relative 25 %   Monocytes Relative 12 %   Eosinophils Relative 3 %   Basophils Relative 2 %   Smear Review Criteria for review not met   VITAMIN D 25 Hydroxy (Vit-D Deficiency, Fractures)  Result Value Ref Range   Vit D, 25-Hydroxy 38 30 - 100 ng/mL  Ferritin  Result Value Ref Range   Ferritin 52 20 - 288 ng/mL      Assessment & Plan:   Problem List Items Addressed This Visit    Advanced care planning/counseling discussion    Advanced directives - has living will at home. HCPOA would be son Clare Gandy) and daughter Lelan Pons), also would be ok with other children helping. Ok with CPR, intubation and limited trial for reversible conditions, does not want prolonged life support if terminal condition. Brings copy today 09/21/2015      Anemia associated with nutritional deficiency    Mild - continue to monitor.       B12 deficiency    Continue monthly b12 shots.       Benign hypertensive heart disease without heart failure    Chronic, stable. Continue current regimen. Has f/u with cards yearly.       Medicare annual wellness visit, subsequent - Primary    I have personally reviewed the Medicare Annual Wellness questionnaire and have noted 1. The patient's medical and social history 2. Their use of alcohol, tobacco or  illicit drugs 3. Their current medications and supplements 4. The patient's functional ability including ADL's, fall risks, home safety risks and hearing or visual impairment. Cognitive function has been assessed and addressed as indicated.  5. Diet and physical activity 6. Evidence for depression or mood disorders The patients weight, height, BMI have been recorded in the chart. I have made referrals, counseling and provided education to the patient based on review of the above and I have provided the pt with a written personalized care plan for preventive services. Provider list updated.. See scanned questionairre as needed for further documentation. Reviewed preventative protocols and updated unless pt declined.       Osteopenia    Reviewed latest DEXA. Discussed calcium in diet, regular weight bearing exercises.      Vitamin D deficiency    Continue replacement.           Follow up plan: No Follow-up on file.  Ria Bush, MD

## 2016-09-29 NOTE — Assessment & Plan Note (Signed)

## 2016-09-29 NOTE — Assessment & Plan Note (Signed)
Discussed prn tylenol use

## 2016-09-29 NOTE — Assessment & Plan Note (Signed)
Reviewed latest DEXA. Discussed calcium in diet, regular weight bearing exercises.

## 2016-09-29 NOTE — Patient Instructions (Addendum)
B12 shot today You are doing well today. Blood pressure a bit high today. Monitor at home and let me know if persistently elevated readings.  Return as needed or in 1 year for next medicare wellness visit  Continue monthly B12 shots.   Health Maintenance, Female Adopting a healthy lifestyle and getting preventive care can go a long way to promote health and wellness. Talk with your health care provider about what schedule of regular examinations is right for you. This is a good chance for you to check in with your provider about disease prevention and staying healthy. In between checkups, there are plenty of things you can do on your own. Experts have done a lot of research about which lifestyle changes and preventive measures are most likely to keep you healthy. Ask your health care provider for more information. Weight and diet Eat a healthy diet  Be sure to include plenty of vegetables, fruits, low-fat dairy products, and lean protein.  Do not eat a lot of foods high in solid fats, added sugars, or salt.  Get regular exercise. This is one of the most important things you can do for your health. ? Most adults should exercise for at least 150 minutes each week. The exercise should increase your heart rate and make you sweat (moderate-intensity exercise). ? Most adults should also do strengthening exercises at least twice a week. This is in addition to the moderate-intensity exercise.  Maintain a healthy weight  Body mass index (BMI) is a measurement that can be used to identify possible weight problems. It estimates body fat based on height and weight. Your health care provider can help determine your BMI and help you achieve or maintain a healthy weight.  For females 25 years of age and older: ? A BMI below 18.5 is considered underweight. ? A BMI of 18.5 to 24.9 is normal. ? A BMI of 25 to 29.9 is considered overweight. ? A BMI of 30 and above is considered obese.  Watch levels of  cholesterol and blood lipids  You should start having your blood tested for lipids and cholesterol at 81 years of age, then have this test every 5 years.  You may need to have your cholesterol levels checked more often if: ? Your lipid or cholesterol levels are high. ? You are older than 81 years of age. ? You are at high risk for heart disease.  Cancer screening Lung Cancer  Lung cancer screening is recommended for adults 42-59 years old who are at high risk for lung cancer because of a history of smoking.  A yearly low-dose CT scan of the lungs is recommended for people who: ? Currently smoke. ? Have quit within the past 15 years. ? Have at least a 30-pack-year history of smoking. A pack year is smoking an average of one pack of cigarettes a day for 1 year.  Yearly screening should continue until it has been 15 years since you quit.  Yearly screening should stop if you develop a health problem that would prevent you from having lung cancer treatment.  Breast Cancer  Practice breast self-awareness. This means understanding how your breasts normally appear and feel.  It also means doing regular breast self-exams. Let your health care provider know about any changes, no matter how small.  If you are in your 20s or 30s, you should have a clinical breast exam (CBE) by a health care provider every 1-3 years as part of a regular health exam.  If  you are 40 or older, have a CBE every year. Also consider having a breast X-ray (mammogram) every year.  If you have a family history of breast cancer, talk to your health care provider about genetic screening.  If you are at high risk for breast cancer, talk to your health care provider about having an MRI and a mammogram every year.  Breast cancer gene (BRCA) assessment is recommended for women who have family members with BRCA-related cancers. BRCA-related cancers include: ? Breast. ? Ovarian. ? Tubal. ? Peritoneal cancers.  Results  of the assessment will determine the need for genetic counseling and BRCA1 and BRCA2 testing.  Cervical Cancer Your health care provider may recommend that you be screened regularly for cancer of the pelvic organs (ovaries, uterus, and vagina). This screening involves a pelvic examination, including checking for microscopic changes to the surface of your cervix (Pap test). You may be encouraged to have this screening done every 3 years, beginning at age 48.  For women ages 57-65, health care providers may recommend pelvic exams and Pap testing every 3 years, or they may recommend the Pap and pelvic exam, combined with testing for human papilloma virus (HPV), every 5 years. Some types of HPV increase your risk of cervical cancer. Testing for HPV may also be done on women of any age with unclear Pap test results.  Other health care providers may not recommend any screening for nonpregnant women who are considered low risk for pelvic cancer and who do not have symptoms. Ask your health care provider if a screening pelvic exam is right for you.  If you have had past treatment for cervical cancer or a condition that could lead to cancer, you need Pap tests and screening for cancer for at least 20 years after your treatment. If Pap tests have been discontinued, your risk factors (such as having a new sexual partner) need to be reassessed to determine if screening should resume. Some women have medical problems that increase the chance of getting cervical cancer. In these cases, your health care provider may recommend more frequent screening and Pap tests.  Colorectal Cancer  This type of cancer can be detected and often prevented.  Routine colorectal cancer screening usually begins at 81 years of age and continues through 81 years of age.  Your health care provider may recommend screening at an earlier age if you have risk factors for colon cancer.  Your health care provider may also recommend using  home test kits to check for hidden blood in the stool.  A small camera at the end of a tube can be used to examine your colon directly (sigmoidoscopy or colonoscopy). This is done to check for the earliest forms of colorectal cancer.  Routine screening usually begins at age 39.  Direct examination of the colon should be repeated every 5-10 years through 81 years of age. However, you may need to be screened more often if early forms of precancerous polyps or small growths are found.  Skin Cancer  Check your skin from head to toe regularly.  Tell your health care provider about any new moles or changes in moles, especially if there is a change in a mole's shape or color.  Also tell your health care provider if you have a mole that is larger than the size of a pencil eraser.  Always use sunscreen. Apply sunscreen liberally and repeatedly throughout the day.  Protect yourself by wearing long sleeves, pants, a wide-brimmed hat, and sunglasses  whenever you are outside.  Heart disease, diabetes, and high blood pressure  High blood pressure causes heart disease and increases the risk of stroke. High blood pressure is more likely to develop in: ? People who have blood pressure in the high end of the normal range (130-139/85-89 mm Hg). ? People who are overweight or obese. ? People who are African American.  If you are 37-26 years of age, have your blood pressure checked every 3-5 years. If you are 54 years of age or older, have your blood pressure checked every year. You should have your blood pressure measured twice-once when you are at a hospital or clinic, and once when you are not at a hospital or clinic. Record the average of the two measurements. To check your blood pressure when you are not at a hospital or clinic, you can use: ? An automated blood pressure machine at a pharmacy. ? A home blood pressure monitor.  If you are between 66 years and 46 years old, ask your health care provider  if you should take aspirin to prevent strokes.  Have regular diabetes screenings. This involves taking a blood sample to check your fasting blood sugar level. ? If you are at a normal weight and have a low risk for diabetes, have this test once every three years after 81 years of age. ? If you are overweight and have a high risk for diabetes, consider being tested at a younger age or more often. Preventing infection Hepatitis B  If you have a higher risk for hepatitis B, you should be screened for this virus. You are considered at high risk for hepatitis B if: ? You were born in a country where hepatitis B is common. Ask your health care provider which countries are considered high risk. ? Your parents were born in a high-risk country, and you have not been immunized against hepatitis B (hepatitis B vaccine). ? You have HIV or AIDS. ? You use needles to inject street drugs. ? You live with someone who has hepatitis B. ? You have had sex with someone who has hepatitis B. ? You get hemodialysis treatment. ? You take certain medicines for conditions, including cancer, organ transplantation, and autoimmune conditions.  Hepatitis C  Blood testing is recommended for: ? Everyone born from [birth year] through 1963. ? Anyone with known risk factors for hepatitis C.  Sexually transmitted infections (STIs)  You should be screened for sexually transmitted infections (STIs) including gonorrhea and chlamydia if: ? You are sexually active and are younger than 81 years of age. ? You are older than 81 years of age and your health care provider tells you that you are at risk for this type of infection. ? Your sexual activity has changed since you were last screened and you are at an increased risk for chlamydia or gonorrhea. Ask your health care provider if you are at risk.  If you do not have HIV, but are at risk, it may be recommended that you take a prescription medicine daily to prevent HIV infection. This  is called pre-exposure prophylaxis (PrEP). You are considered at risk if: ? You are sexually active and do not regularly use condoms or know the HIV status of your partner(s). ? You take drugs by injection. ? You are sexually active with a partner who has HIV.  Talk with your health care provider about whether you are at high risk of being infected with HIV. If you choose to begin PrEP, you should  first be tested for HIV. You should then be tested every 3 months for as long as you are taking PrEP. Pregnancy  If you are premenopausal and you may become pregnant, ask your health care provider about preconception counseling.  If you may become pregnant, take 400 to 800 micrograms (mcg) of folic acid every day.  If you want to prevent pregnancy, talk to your health care provider about birth control (contraception). Osteoporosis and menopause  Osteoporosis is a disease in which the bones lose minerals and strength with aging. This can result in serious bone fractures. Your risk for osteoporosis can be identified using a bone density scan.  If you are 41 years of age or older, or if you are at risk for osteoporosis and fractures, ask your health care provider if you should be screened.  Ask your health care provider whether you should take a calcium or vitamin D supplement to lower your risk for osteoporosis.  Menopause may have certain physical symptoms and risks.  Hormone replacement therapy may reduce some of these symptoms and risks. Talk to your health care provider about whether hormone replacement therapy is right for you. Follow these instructions at home:  Schedule regular health, dental, and eye exams.  Stay current with your immunizations.  Do not use any tobacco products including cigarettes, chewing tobacco, or electronic cigarettes.  If you are pregnant, do not drink alcohol.  If you are breastfeeding, limit how much and how often you drink alcohol.  Limit alcohol intake  to no more than 1 drink per day for nonpregnant women. One drink equals 12 ounces of beer, 5 ounces of wine, or 1 ounces of hard liquor.  Do not use street drugs.  Do not share needles.  Ask your health care provider for help if you need support or information about quitting drugs.  Tell your health care provider if you often feel depressed.  Tell your health care provider if you have ever been abused or do not feel safe at home. This information is not intended to replace advice given to you by your health care provider. Make sure you discuss any questions you have with your health care provider. Document Released: 09/23/2010 Document Revised: 08/16/2015 Document Reviewed: 12/12/2014 Elsevier Interactive Patient Education  [employer].

## 2016-09-29 NOTE — Assessment & Plan Note (Signed)
Continue replacement 

## 2016-09-29 NOTE — Assessment & Plan Note (Signed)
Mild continue to monitor 

## 2016-09-29 NOTE — Assessment & Plan Note (Signed)
Advanced directives - has living will at home. HCPOA would be son Priscilla [name]) and daughter Priscilla [name]), also would be ok with other children helping. Ok with CPR, intubation and limited trial for reversible conditions, does not want prolonged life support if terminal condition. Brings copy today 09/21/2015

## 2016-09-29 NOTE — Assessment & Plan Note (Addendum)
Continue monthly b12 shots. Shot today.

## 2016-09-29 NOTE — Assessment & Plan Note (Signed)
Chronic, stable. Continue current regimen. Has f/u with cards yearly.

## 2016-09-29 NOTE — Addendum Note (Signed)
Addended by: Emelia Salisbury C on: 09/29/2016 02:17 PM   Modules accepted: Orders

## 2016-11-05 ENCOUNTER — Ambulatory Visit (INDEPENDENT_AMBULATORY_CARE_PROVIDER_SITE_OTHER): Payer: Medicare Other

## 2016-11-05 DIAGNOSIS — E538 Deficiency of other specified B group vitamins: Secondary | ICD-10-CM

## 2016-11-05 MED ORDER — CYANOCOBALAMIN 1000 MCG/ML IJ SOLN
1000.0000 ug | Freq: Once | INTRAMUSCULAR | Status: AC
  Administered 2016-11-05: 1000 ug via INTRAMUSCULAR

## 2016-11-21 ENCOUNTER — Other Ambulatory Visit: Payer: Self-pay | Admitting: Cardiology

## 2016-11-26 DIAGNOSIS — R197 Diarrhea, unspecified: Secondary | ICD-10-CM | POA: Diagnosis not present

## 2016-12-10 ENCOUNTER — Ambulatory Visit (INDEPENDENT_AMBULATORY_CARE_PROVIDER_SITE_OTHER): Payer: Medicare Other

## 2016-12-10 DIAGNOSIS — E538 Deficiency of other specified B group vitamins: Secondary | ICD-10-CM

## 2016-12-10 MED ORDER — CYANOCOBALAMIN 1000 MCG/ML IJ SOLN
1000.0000 ug | Freq: Once | INTRAMUSCULAR | Status: AC
  Administered 2016-12-10: 1000 ug via INTRAMUSCULAR

## 2016-12-11 ENCOUNTER — Ambulatory Visit (HOSPITAL_COMMUNITY): Payer: Medicare Other | Attending: Cardiology

## 2016-12-11 ENCOUNTER — Other Ambulatory Visit: Payer: Self-pay

## 2016-12-11 DIAGNOSIS — I119 Hypertensive heart disease without heart failure: Secondary | ICD-10-CM | POA: Diagnosis not present

## 2016-12-11 DIAGNOSIS — I351 Nonrheumatic aortic (valve) insufficiency: Secondary | ICD-10-CM | POA: Diagnosis not present

## 2016-12-12 ENCOUNTER — Telehealth: Payer: Self-pay

## 2016-12-12 NOTE — Telephone Encounter (Signed)
On last OV she told she had  Chronic DOE that was stable.  Has her SOB changed?

## 2016-12-12 NOTE — Telephone Encounter (Signed)
Left message for pt to c/b to discuss if she had had a change in her DOE since she last saw Dr Radford Pax in March of this year.

## 2016-12-12 NOTE — Telephone Encounter (Signed)
-----   Message from Sueanne Margarita, MD sent at 12/12/2016  7:57 AM EDT ----- Echo showed mild LVH with normal LVF, mild LAE

## 2016-12-12 NOTE — Telephone Encounter (Signed)
Pt called back after getting message that Echo is stable and wants to know why she is still SOB if there have not be changes for her AV. Advised pt that I would send to MD to review and see if she has any additional testing suggestions for diagnosing her SOB. Pt appreciative and no additional questions at this time.

## 2016-12-12 NOTE — Telephone Encounter (Signed)
Priscilla [name] is returning a call . Thanks

## 2016-12-12 NOTE — Telephone Encounter (Signed)
Spoke with patient who reports it may just seem like her DOE has worsened since she was seen in 05/2016 because it has been summer and she has been more active.  She can become SOB with "walking any distance, cleaning house or doing yard work".  She reports her breathing returns to normal within a minute or so at rest. She denies any weight changes or any swelling.  She does report a cough on occasion that produces some clear sputum.  Aware I will forward this information to Dr Radford Pax for her review and c/b with any recommendations/orders.  She states understanding, is in agreement and was grateful for the call.

## 2016-12-15 NOTE — Telephone Encounter (Signed)
Please have her see her PCP first to make sure there is nothing pulmonary going on

## 2016-12-15 NOTE — Telephone Encounter (Signed)
Advised patient, verbalized understanding  

## 2017-01-12 DIAGNOSIS — Z23 Encounter for immunization: Secondary | ICD-10-CM | POA: Diagnosis not present

## 2017-01-14 ENCOUNTER — Ambulatory Visit (INDEPENDENT_AMBULATORY_CARE_PROVIDER_SITE_OTHER): Payer: Medicare Other | Admitting: *Deleted

## 2017-01-14 ENCOUNTER — Ambulatory Visit: Payer: Medicare Other

## 2017-01-14 DIAGNOSIS — E538 Deficiency of other specified B group vitamins: Secondary | ICD-10-CM

## 2017-01-14 MED ORDER — CYANOCOBALAMIN 1000 MCG/ML IJ SOLN
1000.0000 ug | Freq: Once | INTRAMUSCULAR | Status: AC
  Administered 2017-01-14: 1000 ug via INTRAMUSCULAR

## 2017-01-28 DIAGNOSIS — Z1231 Encounter for screening mammogram for malignant neoplasm of breast: Secondary | ICD-10-CM | POA: Diagnosis not present

## 2017-01-28 LAB — HM MAMMOGRAPHY

## 2017-01-29 ENCOUNTER — Encounter: Payer: Self-pay | Admitting: Family Medicine

## 2017-02-02 ENCOUNTER — Encounter: Payer: Self-pay | Admitting: Family Medicine

## 2017-02-18 ENCOUNTER — Ambulatory Visit (INDEPENDENT_AMBULATORY_CARE_PROVIDER_SITE_OTHER): Payer: Medicare Other

## 2017-02-18 DIAGNOSIS — E538 Deficiency of other specified B group vitamins: Secondary | ICD-10-CM | POA: Diagnosis not present

## 2017-02-18 MED ORDER — CYANOCOBALAMIN 1000 MCG/ML IJ SOLN
1000.0000 ug | Freq: Once | INTRAMUSCULAR | Status: AC
  Administered 2017-02-18: 1000 ug via INTRAMUSCULAR

## 2017-03-09 ENCOUNTER — Ambulatory Visit (INDEPENDENT_AMBULATORY_CARE_PROVIDER_SITE_OTHER): Payer: Medicare Other | Admitting: Podiatry

## 2017-03-09 ENCOUNTER — Encounter: Payer: Self-pay | Admitting: Podiatry

## 2017-03-09 DIAGNOSIS — L600 Ingrowing nail: Secondary | ICD-10-CM | POA: Diagnosis not present

## 2017-03-09 NOTE — Progress Notes (Signed)
Subjective:   Patient ID: Priscilla [name], female   DOB: 83 y.o.   MRN: [id number]   HPI Patient presents with a painful ingrown toenail of the right foot that has been hurting her for the last several months.   ROS      Objective:  Physical Exam  Neurovascular status intact with muscle strength adequate range of motion within normal limits with patient found to have incurvated right hallux lateral quarter with distal redness and no active drainage noted.     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  I explained procedure and risks and patient signed consent form for removal of the corner.  At this time I went ahead and I infiltrated the right hallux 60 mg Xylocaine Marcaine mixture and I then remove the lateral border under sterile technique exposed matrix and applied phenol 3 applications 30 seconds followed by alcohol lavage.  Gave instructions on soaks and reappoint.

## 2017-03-09 NOTE — Patient Instructions (Signed)

## 2017-03-24 HISTORY — PX: EYE SURGERY: SHX253

## 2017-03-31 ENCOUNTER — Ambulatory Visit: Payer: Medicare Other

## 2017-04-02 ENCOUNTER — Ambulatory Visit (INDEPENDENT_AMBULATORY_CARE_PROVIDER_SITE_OTHER): Payer: Medicare Other

## 2017-04-02 DIAGNOSIS — E538 Deficiency of other specified B group vitamins: Secondary | ICD-10-CM

## 2017-04-02 MED ORDER — CYANOCOBALAMIN 1000 MCG/ML IJ SOLN
1000.0000 ug | Freq: Once | INTRAMUSCULAR | Status: AC
  Administered 2017-04-02: 1000 ug via INTRAMUSCULAR

## 2017-05-05 ENCOUNTER — Ambulatory Visit: Payer: Medicare Other

## 2017-05-06 ENCOUNTER — Ambulatory Visit (INDEPENDENT_AMBULATORY_CARE_PROVIDER_SITE_OTHER): Payer: Medicare Other

## 2017-05-06 DIAGNOSIS — E538 Deficiency of other specified B group vitamins: Secondary | ICD-10-CM | POA: Diagnosis not present

## 2017-05-06 MED ORDER — CYANOCOBALAMIN 1000 MCG/ML IJ SOLN
1000.0000 ug | Freq: Once | INTRAMUSCULAR | Status: AC
  Administered 2017-05-06: 1000 ug via INTRAMUSCULAR

## 2017-05-09 NOTE — Progress Notes (Signed)
Noted  

## 2017-05-25 ENCOUNTER — Other Ambulatory Visit: Payer: Self-pay | Admitting: Cardiology

## 2017-06-01 ENCOUNTER — Telehealth: Payer: Self-pay | Admitting: Cardiology

## 2017-06-01 MED ORDER — IRBESARTAN 300 MG PO TABS: 300.0000 mg | ORAL_TABLET | Freq: Every day | ORAL | 0 refills | Status: DC

## 2017-06-01 MED ORDER — HYDROCHLOROTHIAZIDE 12.5 MG PO CAPS: 12.5000 mg | ORAL_CAPSULE | Freq: Every day | ORAL | 0 refills | Status: DC

## 2017-06-01 NOTE — Telephone Encounter (Signed)
Pt calling stating that her pharmacy is unable to refill losartan-HTCZ and would like Dr. Radford Pax to send another medication in to replace this medication. Please address.

## 2017-06-01 NOTE — Telephone Encounter (Signed)
Would see if pharmacy is able to get equivalent dose of irbesartan-HCTZ 300-12.5mg  once daily in stock. He had a dry cough with lisinopril so will need to stay within ARB classes, however recalls and shortages have been making access to medication difficult.

## 2017-06-01 NOTE — Telephone Encounter (Signed)
Informed patient that due to recall we will switch losartan to irbesartan-HCTZ 300-12.5mg  once a day. I explained to patient I would call [facility] to confirm that irbesartan is in stock and if so I would send in a 30 day supply. Patient in agreement with treatment plan.   [place], [place]. pharmacist confirmed that had 300 mg tablets of irbesartan, but they do not have the combination with HCTZ. Prescription sent for irbesartan 300 mg once a day and HCTZ 12.5 mg once a day. She verbalized understanding.

## 2017-06-11 ENCOUNTER — Ambulatory Visit (INDEPENDENT_AMBULATORY_CARE_PROVIDER_SITE_OTHER): Payer: Medicare Other

## 2017-06-11 DIAGNOSIS — E538 Deficiency of other specified B group vitamins: Secondary | ICD-10-CM | POA: Diagnosis not present

## 2017-06-11 MED ORDER — CYANOCOBALAMIN 1000 MCG/ML IJ SOLN
1000.0000 ug | Freq: Once | INTRAMUSCULAR | Status: AC
Start: 2017-06-11 — End: 2017-06-11
  Administered 2017-06-11: 1000 ug via INTRAMUSCULAR

## 2017-06-14 ENCOUNTER — Other Ambulatory Visit: Payer: Self-pay | Admitting: Cardiology

## 2017-06-14 NOTE — Progress Notes (Signed)
b12 shot 

## 2017-06-16 DIAGNOSIS — R194 Change in bowel habit: Secondary | ICD-10-CM | POA: Diagnosis not present

## 2017-06-22 DIAGNOSIS — Z961 Presence of intraocular lens: Secondary | ICD-10-CM | POA: Diagnosis not present

## 2017-06-22 DIAGNOSIS — H43813 Vitreous degeneration, bilateral: Secondary | ICD-10-CM | POA: Diagnosis not present

## 2017-06-22 DIAGNOSIS — H26493 Other secondary cataract, bilateral: Secondary | ICD-10-CM | POA: Diagnosis not present

## 2017-06-22 DIAGNOSIS — H04123 Dry eye syndrome of bilateral lacrimal glands: Secondary | ICD-10-CM | POA: Diagnosis not present

## 2017-06-26 ENCOUNTER — Ambulatory Visit: Payer: Medicare Other | Admitting: Cardiology

## 2017-06-29 ENCOUNTER — Other Ambulatory Visit: Payer: Self-pay | Admitting: Cardiology

## 2017-07-15 ENCOUNTER — Ambulatory Visit (INDEPENDENT_AMBULATORY_CARE_PROVIDER_SITE_OTHER): Payer: Medicare Other | Admitting: *Deleted

## 2017-07-15 DIAGNOSIS — E538 Deficiency of other specified B group vitamins: Secondary | ICD-10-CM | POA: Diagnosis not present

## 2017-07-15 MED ORDER — CYANOCOBALAMIN 1000 MCG/ML IJ SOLN
1000.0000 ug | Freq: Once | INTRAMUSCULAR | Status: AC
  Administered 2017-07-15: 1000 ug via INTRAMUSCULAR

## 2017-07-28 ENCOUNTER — Other Ambulatory Visit: Payer: Self-pay | Admitting: Cardiology

## 2017-08-11 ENCOUNTER — Encounter: Payer: Self-pay | Admitting: Cardiology

## 2017-08-11 ENCOUNTER — Ambulatory Visit (INDEPENDENT_AMBULATORY_CARE_PROVIDER_SITE_OTHER): Payer: Medicare Other | Admitting: Cardiology

## 2017-08-11 ENCOUNTER — Encounter (INDEPENDENT_AMBULATORY_CARE_PROVIDER_SITE_OTHER): Payer: Self-pay

## 2017-08-11 VITALS — BP 170/64 | HR 55 | Ht 61.0 in | Wt 151.8 lb

## 2017-08-11 DIAGNOSIS — I119 Hypertensive heart disease without heart failure: Secondary | ICD-10-CM | POA: Diagnosis not present

## 2017-08-11 DIAGNOSIS — R0602 Shortness of breath: Secondary | ICD-10-CM

## 2017-08-11 DIAGNOSIS — I351 Nonrheumatic aortic (valve) insufficiency: Secondary | ICD-10-CM

## 2017-08-11 MED ORDER — HYDROCHLOROTHIAZIDE 12.5 MG PO CAPS: 12.5000 mg | ORAL_CAPSULE | Freq: Every day | ORAL | 3 refills | Status: DC

## 2017-08-11 MED ORDER — IRBESARTAN 300 MG PO TABS: 300.0000 mg | ORAL_TABLET | Freq: Every day | ORAL | 3 refills | Status: DC

## 2017-08-11 NOTE — Progress Notes (Signed)
Cardiology Office Note:    Date:  08/11/2017   ID:  Priscilla [name], DOB Jul 25, 1930, MRN [id number]  PCP:  Ria Bush, MD  Cardiologist:  No primary care provider on file.    Referring MD: Ria Bush, MD   Chief Complaint  Patient presents with  . Hypertension  . Aortic Insuffiency    History of Present Illness:    Priscilla [name] is a 83 y.o. female with a hx of HTN, moderate aortic insufficiency with mild mitral regurgitation.  She is here today for followup and is doing well.  She denies any chest pain or pressure,  PND, orthopnea, LE edema, dizziness, palpitations or syncope.  She has recently noted some shortness of breath that occurs when she is walking.  She says she is able to work out in her yard but if she tries to walk any distance she does get short of breath.  She thinks is getting worse.  She is compliant with her meds and is tolerating meds with no SE.    Past Medical History:  Diagnosis Date  . Aortic insufficiency    mild to moderate on echo 2017  . B12 deficiency 03/25/2014   H/o small bowel obstruction with resection as well as colorectal cancer s/p partial colectomy.  Receives B12 injections   . Cancer ([place]) 2001, 2008   colon, rectal  . Chest pain, atypical    no ischemia on nuclear stress test 2017  . History of chicken pox   . History of Clostridium difficile 2012  . History of DVT (deep vein thrombosis) 2002   right leg, after chemo for first cancer, treated  . Hypertension   . IDA (iron deficiency anemia)   . Open wound of abdominal wall with complication   . Osteoarthritis   . Osteopenia 09/2011   DEXA 2013 spine -2.2, femur -1.6, DEXA 12/2015 - hip T score -1.9  . SBO (small bowel obstruction) ([place]) 02/05/2011   On 02/05/11 she had enterolysis and small bowel resection     Past Surgical History:  Procedure Laterality Date  . ABDOMINAL HYSTERECTOMY  1984  . APPENDECTOMY  1984  . BOWEL RESECTION  02/05/2011   Procedure: SMALL BOWEL  RESECTION;  Surgeon: Harl Bowie, MD;  Location: [facility];  Service: General;  Laterality: N/A;  . broken nose    . CARDIOVASCULAR STRESS TEST  11/2015   WNL ([facility] Froman)  . COLECTOMY    . COLON SURGERY  2001 and 2008  . COLONOSCOPY  12/2012   erosions at colonic anastomosis, o/w normal rpt 5 yrs (Dr. Penelope Coop at [facility])  . COLOSTOMY    . DEXA  09/2011   T-score: spine -2.2, femur -1.6  . ESOPHAGOGASTRODUODENOSCOPY  01/31/2011   Procedure: ESOPHAGOGASTRODUODENOSCOPY (EGD);  Surgeon: Missy Sabins, MD;  Location: [facility] ENDOSCOPY;  Service: Endoscopy;  Laterality: N/A;  duodenal bx;s r/o giardia, r/o celiac diease  . FLEXIBLE SIGMOIDOSCOPY  01/31/2011   Procedure: FLEXIBLE SIGMOIDOSCOPY;  Surgeon: Missy Sabins, MD;  Location: [facility];  Service: Endoscopy;  Laterality: N/A;  . ILEOSTOMY    . JOINT REPLACEMENT  2010   knee   . LAPAROTOMY  02/05/2011   Procedure: EXPLORATORY LAPAROTOMY;  Surgeon: Harl Bowie, MD;  Location: [facility];  Service: General;  Laterality: N/A;  lysis of adhesions.  . NUCLEAR STRESS TEST  11/17/08   NORMAL  . REPLACEMENT TOTAL KNEE  2010   RIGHT KNEE  . WOUND DEBRIDEMENT N/A 05/21/2012   Procedure: Excision non  healing ABDOMINAL WOUND;  Surgeon: Harl Bowie, MD;  Location: [facility];  Service: General;  Laterality: N/A;    Current Medications: Current Meds  Medication Sig  . aspirin 81 MG chewable tablet Chew 81 mg by mouth daily.  Marland Kitchen atenolol (TENORMIN) 50 MG tablet Take 1 tablet (50 mg total) by mouth 2 (two) times daily. Please keep upcoming appt for future refills. Thank you  . Biotin 5 MG CAPS Take 1 capsule by mouth daily.  . cholecalciferol (VITAMIN D) 1000 UNITS tablet Take 2,000 Units by mouth daily.   . cyanocobalamin (,VITAMIN B-12,) 1000 MCG/ML injection Inject 1 mL (1,000 mcg total) into the muscle every 30 (thirty) days. Start 03/2014  . ferrous sulfate 325 (65 FE) MG tablet Take 325 mg by mouth daily with breakfast.  . hydrochlorothiazide  (MICROZIDE) 12.5 MG capsule TAKE 1 CAPSULE BY MOUTH EVERY DAY  . irbesartan (AVAPRO) 300 MG tablet TAKE 1 TABLET BY MOUTH EVERY DAY  . nitroGLYCERIN (NITROSTAT) 0.4 MG SL tablet Place 1 tablet (0.4 mg total) under the tongue every 5 (five) minutes as needed for chest pain.  . [DISCONTINUED] Calcium Carb-Cholecalciferol (CALCIUM-VITAMIN D) 600-400 MG-UNIT TABS Take 1 tablet by mouth daily.     Allergies:   Morphine and related and Lisinopril   Social History   Socioeconomic History  . Marital status: Widowed    Spouse name: Not on file  . Number of children: Not on file  . Years of education: Not on file  . Highest education level: Not on file  Occupational History  . Not on file  Social Needs  . Financial resource strain: Not on file  . Food insecurity:    Worry: Not on file    Inability: Not on file  . Transportation needs:    Medical: Not on file    Non-medical: Not on file  Tobacco Use  . Smoking status: Never Smoker  . Smokeless tobacco: Never Used  Substance and Sexual Activity  . Alcohol use: No  . Drug use: No  . Sexual activity: Not on file  Lifestyle  . Physical activity:    Days per week: Not on file    Minutes per session: Not on file  . Stress: Not on file  Relationships  . Social connections:    Talks on phone: Not on file    Gets together: Not on file    Attends religious service: Not on file    Active member of club or organization: Not on file    Attends meetings of clubs or organizations: Not on file    Relationship status: Not on file  Other Topics Concern  . Not on file  Social History Narrative   Caffeine: decaf   Lives alone, daughter lives nearby to visit.   Occupation: retired, worked at [employer] office   Edu: HS   Activity: tries to get walking, walks up and down steps at home   Diet: good water, fruits/vegetables daily.  1 cup milk/day, cheese.      Cards: Dr. Mare Ferrari   Surgeon: Dr. Rush Farmer     Family History: The patient's family  history includes Colon cancer in her paternal grandmother; Congestive Heart Failure in her mother; Coronary artery disease in her brother and sister; Heart attack in her brother, father, mother, and sister; Heart disease in her father and mother; Lupus in her father. There is no history of Diabetes, Stroke, or Hypertension.  ROS:   Please see the history of present illness.  ROS  All other systems reviewed and negative.   EKGs/Labs/Other Studies Reviewed:    The following studies were reviewed today: none  EKG:  EKG is ordered today.  The ekg ordered today demonstrates sinus bradycardia 55 bpm with first-degree AV block and no ST-T wave abnormality.  Recent Labs: 09/22/2016: BUN 17; Creat 0.92; Hemoglobin 11.5; Platelets 286; Potassium 4.7; Sodium 136   Recent Lipid Panel    Component Value Date/Time   CHOL 171 09/12/2014 0838   TRIG 137.0 09/12/2014 0838   HDL 70.70 09/12/2014 0838   CHOLHDL 2 09/12/2014 0838   VLDL 27.4 09/12/2014 0838   LDLCALC 73 09/12/2014 0838   LDLDIRECT 101.7 12/12/2010 0846    Physical Exam:    VS:  BP (!) 170/64   Pulse (!) 55   Ht 5\' 1"  (1.549 m)   Wt 151 lb 12.8 oz (68.9 kg)   BMI 28.68 kg/m     Wt Readings from Last 3 Encounters:  08/11/17 151 lb 12.8 oz (68.9 kg)  09/29/16 151 lb (68.5 kg)  06/05/16 154 lb 1.9 oz (69.9 kg)     GEN:  Well nourished, well developed in no acute distress HEENT: Normal NECK: No JVD; No carotid bruits LYMPHATICS: No lymphadenopathy CARDIAC: RRR, no murmurs, rubs, gallops RESPIRATORY:  Clear to auscultation without rales, wheezing or rhonchi  ABDOMEN: Soft, non-tender, non-distended MUSCULOSKELETAL:  No edema; No deformity  SKIN: Warm and dry NEUROLOGIC:  Alert and oriented x 3 PSYCHIATRIC:  Normal affect   ASSESSMENT:    1. Benign hypertensive heart disease without heart failure   2. Nonrheumatic aortic valve insufficiency   3. SOB (shortness of breath)    PLAN:    In order of problems listed  above:  1.  HTN - BP is elevated on exam today but at home runs 130/67mmHg.  She has white coat HTN.Marland Kitchen  She will continue on HCTZ 12.5mg  daily, irbesartan 300mg  daily and atenolol 50mg  BID.  Creatinine stable at 0.920 on 09/22/2016.  2.  Aortic insufficiency - trivial AI on echo 11/2016  3.  Shortness of breath - echo last fall showed normal LV function.  I am going to get a Lexiscan Myoview to rule out ischemia given her dyspnea on exertion.    Medication Adjustments/Labs and Tests Ordered: Current medicines are reviewed at length with the patient today.  Concerns regarding medicines are outlined above.  Orders Placed This Encounter  Procedures  . EKG 12-Lead   No orders of the defined types were placed in this encounter.   Signed, Fransico Him, MD  08/11/2017 10:44 AM    [place]

## 2017-08-11 NOTE — Patient Instructions (Signed)
Medication Instructions:  Your physician recommends that you continue on your current medications as directed. Please refer to the Current Medication list given to you today.  If you need a refill on your cardiac medications, please contact your pharmacy first.  Labwork: None ordered   Testing/Procedures: Your physician has requested that you have a lexiscan myoview. For further information please visit [email or web address]. Please follow instruction sheet, as given.  Follow-Up: Your physician wants you to follow-up in: 1 year with Dr. Radford Pax. You will receive a reminder letter in the mail two months in advance. If you don't receive a letter, please call our office to schedule the follow-up appointment.  Any Other Special Instructions Will Be Listed Below (If Applicable).   Thank you for choosing [facility], RN  [phone]  If you need a refill on your cardiac medications before your next appointment, please call your pharmacy.

## 2017-08-13 ENCOUNTER — Telehealth (HOSPITAL_COMMUNITY): Payer: Self-pay | Admitting: *Deleted

## 2017-08-13 NOTE — Telephone Encounter (Signed)
Left message on voicemail per DPR in reference to upcoming appointment scheduled on 08/18/17 with detailed instructions given per Myocardial Perfusion Study Information Sheet for the test. LM to arrive 15 minutes early, and that it is imperative to arrive on time for appointment to keep from having the test rescheduled. If you need to cancel or reschedule your appointment, please call the office within 24 hours of your appointment. Failure to do so may result in a cancellation of your appointment, and a $50 no show fee. Phone number given for call back for any questions. Priscilla [name]    

## 2017-08-18 ENCOUNTER — Ambulatory Visit (HOSPITAL_COMMUNITY): Payer: Medicare Other | Attending: Cardiovascular Disease

## 2017-08-18 DIAGNOSIS — I10 Essential (primary) hypertension: Secondary | ICD-10-CM | POA: Diagnosis not present

## 2017-08-18 DIAGNOSIS — R0602 Shortness of breath: Secondary | ICD-10-CM | POA: Diagnosis not present

## 2017-08-18 LAB — MYOCARDIAL PERFUSION IMAGING
CHL CUP NUCLEAR SDS: 2
CHL CUP NUCLEAR SRS: 15
CSEPPHR: 82 {beats}/min
LV dias vol: 75 mL (ref 46–106)
LV sys vol: 20 mL
RATE: 0.26
Rest HR: 63 {beats}/min
SSS: 17
TID: 0.93

## 2017-08-18 MED ORDER — TECHNETIUM TC 99M TETROFOSMIN IV KIT
10.3000 | PACK | Freq: Once | INTRAVENOUS | Status: AC | PRN
  Administered 2017-08-18: 10.3 via INTRAVENOUS
  Filled 2017-08-18: qty 11

## 2017-08-18 MED ORDER — REGADENOSON 0.4 MG/5ML IV SOLN
0.4000 mg | Freq: Once | INTRAVENOUS | Status: AC
  Administered 2017-08-18: 0.4 mg via INTRAVENOUS

## 2017-08-18 MED ORDER — TECHNETIUM TC 99M TETROFOSMIN IV KIT
31.8000 | PACK | Freq: Once | INTRAVENOUS | Status: AC | PRN
  Administered 2017-08-18: 31.8 via INTRAVENOUS
  Filled 2017-08-18: qty 32

## 2017-08-19 ENCOUNTER — Ambulatory Visit (INDEPENDENT_AMBULATORY_CARE_PROVIDER_SITE_OTHER): Payer: Medicare Other

## 2017-08-19 ENCOUNTER — Telehealth: Payer: Self-pay

## 2017-08-19 DIAGNOSIS — E538 Deficiency of other specified B group vitamins: Secondary | ICD-10-CM

## 2017-08-19 MED ORDER — CYANOCOBALAMIN 1000 MCG/ML IJ SOLN
1000.0000 ug | Freq: Once | INTRAMUSCULAR | Status: AC
  Administered 2017-08-19: 1000 ug via INTRAMUSCULAR

## 2017-08-19 NOTE — Telephone Encounter (Signed)
May try OTC supplement melatonin 5mg  at bedtime as needed for sleep. Don't take with tylenol PM. Make sure has regular bedtime routine and doesn't take too many daytime naps.

## 2017-08-19 NOTE — Progress Notes (Signed)
Per orders of Dr. Gutierrez, injection of Vit B 12 given by Burnard Enis. Patient tolerated injection well.  

## 2017-08-19 NOTE — Telephone Encounter (Signed)
Pt having trouble getting to sleep at night and wakes up after goes to sleep around 2 AM and pt cannot go back to sleep for a long time.pt has been taking Tylenol PM 1/2 pill at hs which helps pt to sleep but pt feels groggy the next day. Pt wants to know preferably an OTC sleep aid pt could take without grogginess the next day. [facility] and pt request cb after Dr Darnell Level reviews.

## 2017-08-19 NOTE — Telephone Encounter (Signed)
Spoke with pt relaying message and instructions per Dr. G.  Pt verbalizes understanding. 

## 2017-08-20 ENCOUNTER — Telehealth: Payer: Self-pay

## 2017-08-20 DIAGNOSIS — R0602 Shortness of breath: Secondary | ICD-10-CM

## 2017-08-20 NOTE — Telephone Encounter (Signed)
Notes recorded by Teressa Senter, RN on 08/20/2017 at 4:54 PM EDT Pt made aware that stress test was fine. Informed pt Dr. Radford Pax recommends getting PFT to assess sob. She is in agreement with plan. Informed her our schedulers will be calling her to schedule appt. She states understanding and thankful for the call   Notes recorded by Sueanne Margarita, MD on 08/18/2017 at 6:17 PM EDT Please order PFTs for SOB Notes recorded by Sueanne Margarita, MD on 08/18/2017 at 6:15 PM EDT Please let patient know that stress test was fine

## 2017-09-01 ENCOUNTER — Ambulatory Visit (INDEPENDENT_AMBULATORY_CARE_PROVIDER_SITE_OTHER): Payer: Medicare Other | Admitting: Internal Medicine

## 2017-09-01 DIAGNOSIS — R0602 Shortness of breath: Secondary | ICD-10-CM

## 2017-09-01 LAB — PULMONARY FUNCTION TEST
DL/VA % pred: 90 %
DL/VA: 3.86 ml/min/mmHg/L
DLCO UNC % PRED: 83 %
DLCO unc: 16.06 ml/min/mmHg
FEF 25-75 Post: 1.85 L/sec
FEF 25-75 Pre: 1.21 L/sec
FEF2575-%CHANGE-POST: 53 %
FEF2575-%PRED-POST: 195 %
FEF2575-%Pred-Pre: 127 %
FEV1-%CHANGE-POST: 6 %
FEV1-%PRED-POST: 134 %
FEV1-%Pred-Pre: 125 %
FEV1-POST: 1.92 L
FEV1-PRE: 1.8 L
FEV1FVC-%CHANGE-POST: 5 %
FEV1FVC-%Pred-Pre: 102 %
FEV6-%Change-Post: 3 %
FEV6-%PRED-PRE: 128 %
FEV6-%Pred-Post: 133 %
FEV6-PRE: 2.34 L
FEV6-Post: 2.42 L
FEV6FVC-%Change-Post: 2 %
FEV6FVC-%PRED-PRE: 104 %
FEV6FVC-%Pred-Post: 107 %
FVC-%CHANGE-POST: 0 %
FVC-%PRED-POST: 123 %
FVC-%PRED-PRE: 122 %
FVC-POST: 2.42 L
FVC-PRE: 2.4 L
POST FEV1/FVC RATIO: 79 %
POST FEV6/FVC RATIO: 100 %
Pre FEV1/FVC ratio: 75 %
Pre FEV6/FVC Ratio: 97 %
RV % PRED: 87 %
RV: 2.01 L
TLC % pred: 100 %
TLC: 4.49 L

## 2017-09-01 NOTE — Progress Notes (Signed)
PFT done today. 

## 2017-09-02 ENCOUNTER — Encounter: Payer: Self-pay | Admitting: Family Medicine

## 2017-09-03 ENCOUNTER — Telehealth: Payer: Self-pay

## 2017-09-03 ENCOUNTER — Telehealth: Payer: Self-pay | Admitting: Cardiology

## 2017-09-03 DIAGNOSIS — H26491 Other secondary cataract, right eye: Secondary | ICD-10-CM | POA: Diagnosis not present

## 2017-09-03 DIAGNOSIS — R0602 Shortness of breath: Secondary | ICD-10-CM

## 2017-09-03 NOTE — Telephone Encounter (Signed)
I spoke with pt, informing her that a referral was placed for pulmonary due to sob. She agrees with plan and thankful for the call

## 2017-09-03 NOTE — Telephone Encounter (Signed)
New message: ° ° ° °Pt is returning call. °

## 2017-09-03 NOTE — Telephone Encounter (Signed)
Notes recorded by Teressa Senter, RN on 09/03/2017 at 11:19 AM EDT Left detailed message informing pt that Dr. Radford Pax recommends referring pt to pulmonology due to sob. Explained that she will receive a call from the pulmonologist office with an appt and to call back with any questions or concerns.   Notes recorded by Sueanne Margarita, MD on 09/02/2017 at 12:21 PM EDT Please refer to Pulmonary

## 2017-09-08 ENCOUNTER — Other Ambulatory Visit: Payer: Self-pay

## 2017-09-08 MED ORDER — ATENOLOL 50 MG PO TABS: 50.0000 mg | ORAL_TABLET | Freq: Two times a day (BID) | ORAL | 3 refills | Status: DC

## 2017-09-22 ENCOUNTER — Ambulatory Visit (INDEPENDENT_AMBULATORY_CARE_PROVIDER_SITE_OTHER): Payer: Medicare Other | Admitting: *Deleted

## 2017-09-22 DIAGNOSIS — E538 Deficiency of other specified B group vitamins: Secondary | ICD-10-CM

## 2017-09-22 MED ORDER — CYANOCOBALAMIN 1000 MCG/ML IJ SOLN
1000.0000 ug | Freq: Once | INTRAMUSCULAR | Status: AC
Start: 2017-09-22 — End: 2017-09-22
  Administered 2017-09-22: 1000 ug via INTRAMUSCULAR

## 2017-09-22 NOTE — Progress Notes (Addendum)
Per orders of Dr. Danise Mina, injection of B12 1000 mcg/ml given by Letti Towell. Patient tolerated injection well.

## 2017-09-30 ENCOUNTER — Other Ambulatory Visit (INDEPENDENT_AMBULATORY_CARE_PROVIDER_SITE_OTHER): Payer: Medicare Other

## 2017-09-30 ENCOUNTER — Ambulatory Visit (INDEPENDENT_AMBULATORY_CARE_PROVIDER_SITE_OTHER)
Admission: RE | Admit: 2017-09-30 | Discharge: 2017-09-30 | Disposition: A | Discharge status: Home / Self Care | Payer: Medicare Other | Source: Ambulatory Visit | Attending: Internal Medicine | Admitting: Internal Medicine

## 2017-09-30 ENCOUNTER — Encounter: Payer: Self-pay | Admitting: Internal Medicine

## 2017-09-30 ENCOUNTER — Ambulatory Visit (INDEPENDENT_AMBULATORY_CARE_PROVIDER_SITE_OTHER): Payer: Medicare Other | Admitting: Internal Medicine

## 2017-09-30 VITALS — BP 170/82 | HR 57 | Ht 61.0 in | Wt 152.0 lb

## 2017-09-30 DIAGNOSIS — R0609 Other forms of dyspnea: Secondary | ICD-10-CM

## 2017-09-30 DIAGNOSIS — R0602 Shortness of breath: Secondary | ICD-10-CM | POA: Diagnosis not present

## 2017-09-30 DIAGNOSIS — I10 Essential (primary) hypertension: Secondary | ICD-10-CM

## 2017-09-30 LAB — CBC WITH DIFFERENTIAL/PLATELET
Basophils Absolute: 0.1 10*3/uL (ref 0.0–0.1)
Basophils Relative: 2 % (ref 0.0–3.0)
EOS ABS: 0.1 10*3/uL (ref 0.0–0.7)
Eosinophils Relative: 1.5 % (ref 0.0–5.0)
HEMATOCRIT: 36.1 % (ref 36.0–46.0)
Hemoglobin: 12.2 g/dL (ref 12.0–15.0)
LYMPHS ABS: 1.4 10*3/uL (ref 0.7–4.0)
LYMPHS PCT: 24.5 % (ref 12.0–46.0)
MCHC: 33.8 g/dL (ref 30.0–36.0)
MCV: 92.9 fl (ref 78.0–100.0)
Monocytes Absolute: 0.7 10*3/uL (ref 0.1–1.0)
Monocytes Relative: 11.7 % (ref 3.0–12.0)
NEUTROS ABS: 3.5 10*3/uL (ref 1.4–7.7)
NEUTROS PCT: 60.3 % (ref 43.0–77.0)
PLATELETS: 319 10*3/uL (ref 150.0–400.0)
RBC: 3.88 Mil/uL (ref 3.87–5.11)
RDW: 12.8 % (ref 11.5–15.5)
WBC: 5.8 10*3/uL (ref 4.0–10.5)

## 2017-09-30 LAB — BASIC METABOLIC PANEL
BUN: 23 mg/dL (ref 6–23)
CALCIUM: 9.8 mg/dL (ref 8.4–10.5)
CO2: 29 mEq/L (ref 19–32)
Chloride: 100 mEq/L (ref 96–112)
Creatinine, Ser: 1.07 mg/dL (ref 0.40–1.20)
GFR: 51.83 mL/min — AB (ref >60.00)
GLUCOSE: 109 mg/dL — AB (ref 70–99)
Potassium: 4.8 mEq/L (ref 3.5–5.1)
SODIUM: 135 meq/L (ref 135–145)

## 2017-09-30 LAB — TSH: TSH: 2.95 u[IU]/mL (ref 0.35–4.50)

## 2017-09-30 LAB — BRAIN NATRIURETIC PEPTIDE: Pro B Natriuretic peptide (BNP): 237 pg/mL — ABNORMAL HIGH (ref 0.0–100.0)

## 2017-09-30 MED ORDER — HYDROCHLOROTHIAZIDE 25 MG PO TABS: ORAL_TABLET | ORAL | 11 refills | Status: DC

## 2017-09-30 MED ORDER — FAMOTIDINE 20 MG PO TABS: ORAL_TABLET | ORAL | 11 refills | Status: DC

## 2017-09-30 MED ORDER — PANTOPRAZOLE SODIUM 40 MG PO TBEC: 40.0000 mg | DELAYED_RELEASE_TABLET | Freq: Every day | ORAL | 2 refills | Status: DC

## 2017-09-30 NOTE — Progress Notes (Signed)
Priscilla [name], female    DOB: [birth year],    MRN: [id number]    Brief patient profile:  32 yowf never smoker with h/o taking allergy shots for "face swelling" but no upper or lower resp symptoms in her 19's which resolved and h/o ACEi cough in 2012 also resolved off acei and all better winter 2019 with cough and sob with neg cards w/u so referred to pulmonary clinic 09/30/2017 by Dr  Radford Pax      09/30/2017   Pulmonary consultation/ Jhonatan Lomeli  Chief Complaint  Patient presents with  . Consult    Referred by [facility] due to SOB with exertion that she states has been bothering her 6 months to a year. Pt also has c/o cough with clear to white mucus. Denies any CP.  Dyspnea:  Indolent onset slowly progressive to point where mb to house flat =sev hundred feet has to go slower than used to and stops and sits when gets back but not every time Cough: assoc with sob at onset / prod white mucus assoc with hoarseness / some sneezing  SABA use: none   Assoc with HB  rx  with tums only    No obvious day to day or daytime variability or assoc  purulent sputum or mucus plugs or hemoptysis or cp or chest tightness, subjective wheeze or overt sinus or hb symptoms.   Sleep: one pillow / cough doesn't keep her up  without nocturnal  or early am exacerbation  of respiratory  c/o's or need for noct saba. Also denies any obvious fluctuation of symptoms with weather or environmental changes or other aggravating or alleviating factors except as outlined above   No unusual exposure hx or h/o childhood pna/ asthma or knowledge of premature birth.  Current Allergies, Complete Past Medical History, Past Surgical History, Family History, and Social History were reviewed in [employer] record.  ROS  The following are not active complaints unless bolded Hoarseness, sore throat, dysphagia, dental problems, itching, sneezing,  nasal congestion or discharge of excess mucus or purulent  secretions, ear ache,   fever, chills, sweats, unintended wt loss or wt gain, classically pleuritic or exertional cp,  orthopnea pnd or arm/hand swelling  or leg swelling, presyncope, palpitations, abdominal pain, anorexia, nausea, vomiting, diarrhea  or change in bowel habits or change in bladder habits, change in stools or change in urine, dysuria, hematuria,  rash, arthralgias, visual complaints, headache, numbness, weakness or ataxia or problems with walking or coordination,  change in mood or  memory.            Past Medical History:  Diagnosis Date  . Aortic insufficiency    mild to moderate on echo 2017  . B12 deficiency 03/25/2014   H/o small bowel obstruction with resection as well as colorectal cancer s/p partial colectomy.  Receives B12 injections   . Cancer ([place]) 2001, 2008   colon, rectal  . Chest pain, atypical    no ischemia on nuclear stress test 2017  . History of chicken pox   . History of Clostridium difficile 2012  . History of DVT (deep vein thrombosis) 2002   right leg, after chemo for first cancer, treated  . Hypertension   . IDA (iron deficiency anemia)   . Open wound of abdominal wall with complication   . Osteoarthritis   . Osteopenia 09/2011   DEXA 2013 spine -2.2, femur -1.6, DEXA 12/2015 - hip T score -1.9  . SBO (small bowel  obstruction) ([place]) 02/05/2011   On 02/05/11 she had enterolysis and small bowel resection                 Objective:     BP (!) 170/82 (BP Location: Left Arm, Cuff Size: Normal)   Pulse (!) 57   Ht 5\' 1"  (1.549 m)   Wt 152 lb (68.9 kg)   SpO2 97%   BMI 28.72 kg/m   SpO2: 97 %  RA   HEENT: nl dentition, turbinates bilaterally, and oropharynx. Nl external ear canals without cough reflex   NECK :  without JVD/Nodes/TM/ nl carotid upstrokes bilaterally   LUNGS: no acc muscle use,  Nl contour chest which is clear to A and P bilaterally without cough on insp or exp maneuvers   CV:  RRR  no s3 or murmur or increase  in P2, and no edema   ABD:  soft and nontender with nl inspiratory excursion in the supine position. No bruits or organomegaly appreciated, bowel sounds nl  MS:  Nl gait/ ext warm without deformities, calf tenderness, cyanosis or clubbing No obvious joint restrictions   SKIN: warm and dry without lesions    NEURO:  alert, approp, nl sensorium with  no motor or cerebellar deficits apparent.     CXR PA and Lateral:   09/30/2017 :    I personally reviewed images and agree with radiology impression as follows:    No acute cardiopulmonary disease.   . Labs ordered/ reviewed:      Chemistry      Component Value Date/Time   NA 135 09/30/2017 1057   K 4.8 09/30/2017 1057   CL 100 09/30/2017 1057   CO2 29 09/30/2017 1057   BUN 23 09/30/2017 1057   CREATININE 1.07 09/30/2017 1057   CREATININE 0.92 (H) 09/22/2016 0846      Component Value Date/Time   CALCIUM 9.8 09/30/2017 1057                             Lab Results  Component Value Date   WBC 5.8 09/30/2017   HGB 12.2 09/30/2017   HCT 36.1 09/30/2017   MCV 92.9 09/30/2017   PLT 319.0 09/30/2017        EOS                                                              0.1                                   09/30/2017       Lab Results  Component Value Date   TSH 2.95 09/30/2017     Lab Results  Component Value Date   PROBNP 237.0 (H) 09/30/2017             Assessment   No problem-specific Assessment & Plan notes found for this encounter.     Christinia Gully, MD 09/30/2017

## 2017-09-30 NOTE — Patient Instructions (Addendum)
Change your hctz to 25 mg daily = 12.5 x 2 or one pill of the new prescription = hydrodiuril 25 mg daily   Pantoprazole (protonix) 40 mg   Take  30-60 min before first meal of the day and Pepcid (famotidine)  20 mg one x one hour before bedtime until return to office - this is the best way to tell whether stomach acid is contributing to your problem.     GERD (REFLUX)  is an extremely common cause of respiratory symptoms just like yours , many times with no obvious heartburn at all.    It can be treated with medication, but also with lifestyle changes including elevation of the head of your bed (ideally with 6 inch  bed blocks),  Smoking cessation, avoidance of late meals, excessive alcohol, and avoid fatty foods, chocolate, peppermint, colas, red wine, and acidic juices such as orange juice.  NO MINT OR MENTHOL PRODUCTS SO NO COUGH DROPS   USE SUGARLESS CANDY INSTEAD (Jolley ranchers or Stover's or Life Savers) or even ice chips will also do - the key is to swallow to prevent all throat clearing. NO OIL BASED VITAMINS - use powdered substitutes.    Please remember to go to the lab and x-ray department downstairs in the basement  for your tests - we will call you with the results when they are available.     Please schedule a follow up office visit in 6 weeks, sooner if needed  - ok to cancel if all better

## 2017-10-01 ENCOUNTER — Encounter: Payer: Self-pay | Admitting: Internal Medicine

## 2017-10-01 DIAGNOSIS — I10 Essential (primary) hypertension: Secondary | ICD-10-CM | POA: Insufficient documentation

## 2017-10-01 LAB — RESPIRATORY ALLERGY PROFILE REGION II ~~LOC~~
Allergen, A. alternata, m6: <0.1 kU/L
Allergen, D pternoyssinus,d7: <0.1 kU/L
Allergen, Mouse Urine Protein, e78: <0.1 kU/L
Allergen, Oak,t7: <0.1 kU/L
Allergen, P. notatum, m1: <0.1 kU/L
Aspergillus fumigatus, m3: <0.1 kU/L
Bermuda Grass: <0.1 kU/L
Box Elder IgE: <0.1 kU/L
CLASS: 0
CLASS: 0
CLASS: 0
CLASS: 0
CLASS: 0
CLASS: 0
CLASS: 0
CLASS: 0
CLASS: 0
CLASS: 0
CLASS: 0
CLASS: 0
CLASS: 0
CLASS: 2
Cat Dander: <0.1 kU/L
Class: 0
Class: 0
Class: 0
Class: 0
Class: 0
Class: 0
Class: 0
Class: 0
Class: 0
Class: 0
D. farinae: <0.1 kU/L
Elm IgE: <0.1 kU/L
IgE (Immunoglobulin E), Serum: 13 kU/L (ref <=114)
Johnson Grass: <0.1 kU/L
Sheep Sorrel IgE: <0.1 kU/L
Timothy Grass: 0.93 kU/L — ABNORMAL HIGH

## 2017-10-01 LAB — INTERPRETATION:

## 2017-10-01 NOTE — Assessment & Plan Note (Signed)
lexiscan myoview, PFTs normal 08/2017 Radford Pax) - 09/30/2017  Walked RA x 3 laps @ 185 ft each stopped due to  End of study, nl pace, no sob or desat    Symptoms are markedly disproportionate to objective findings and not clear to what extent this is actually a pulmonary  problem but pt does appear to have difficult to sort out respiratory symptoms of unknown origin for which  DDX  = almost all start with A and  include Adherence, Ace Inhibitors, Acid Reflux, Active Sinus Disease, Alpha 1 Antitripsin deficiency, Anxiety masquerading as Airways dz,  ABPA,  Allergy(esp in young), Aspiration (esp in elderly), Adverse effects of meds,  Active smokers, A bunch of PE's/clot burden (a few small clots can't cause this syndrome unless there is already severe underlying pulm or vascular dz with poor reserve),  Anemia or thyroid disorder, plus two Bs  = Bronchiectasis and Beta blocker use..and one C= CHF     ? Acid (or non-acid) GERD > always difficult to exclude as up to 75% of pts in some series report no assoc GI/ Heartburn symptoms and she has overt HB > rec max (24h)  acid suppression and diet restrictions/ reviewed and instructions given in writing.    ? Allergy/ asthma > nl pfts  Doesn't rule asthma > check allergy profile    ? Anxiety/ depression/ deconditioning  > usually at the bottom of this list of usual suspects  But  may interfere with adherence and also interpretation of response or lack thereof to symptom management which can be quite subjective.   ? BB effects > she is on relatively high doses of tenormin but no evidence of airflow obst on pfts so probably not an issue   ? chf  > cards w/u reviewed, has MR so need to keep afterload down > rec double the diuretic rx and f/u with cards    Will see her back in 6 weeks for recheck

## 2017-10-01 NOTE — Assessment & Plan Note (Addendum)
Not optimally controlled on present regimen. I reviewed this with the patient and emphasized importance of follow-up with primary care.      For now double the dose of hctz      Total time devoted to counseling  > 50 % of initial 60 min office visit:  review case with pt/ discussion of options/alternatives/ personally creating written customized instructions  in presence of pt  then going over those specific  Instructions directly with the pt including how to use all of the meds but in particular covering each new medication in detail and the difference between the maintenance= "automatic" meds and the prns using an action plan format for the latter (If this problem/symptom => do that organization reading Left to right).  Please see AVS from this visit for a full list of these instructions which I personally wrote for this pt and  are unique to this visit.

## 2017-10-02 NOTE — Progress Notes (Signed)
Left detailed msg on machine ok per DPR

## 2017-10-13 ENCOUNTER — Ambulatory Visit: Payer: Medicare Other

## 2017-10-15 DIAGNOSIS — H26492 Other secondary cataract, left eye: Secondary | ICD-10-CM | POA: Diagnosis not present

## 2017-10-21 ENCOUNTER — Encounter: Payer: Medicare Other | Admitting: Family Medicine

## 2017-10-27 ENCOUNTER — Other Ambulatory Visit: Payer: Self-pay | Admitting: Family Medicine

## 2017-10-27 DIAGNOSIS — I10 Essential (primary) hypertension: Secondary | ICD-10-CM

## 2017-10-27 DIAGNOSIS — E559 Vitamin D deficiency, unspecified: Secondary | ICD-10-CM

## 2017-10-27 DIAGNOSIS — E538 Deficiency of other specified B group vitamins: Secondary | ICD-10-CM

## 2017-10-27 DIAGNOSIS — D539 Nutritional anemia, unspecified: Secondary | ICD-10-CM

## 2017-10-28 ENCOUNTER — Encounter: Payer: Self-pay | Admitting: Family Medicine

## 2017-10-28 ENCOUNTER — Ambulatory Visit (INDEPENDENT_AMBULATORY_CARE_PROVIDER_SITE_OTHER): Payer: Medicare Other | Admitting: Family Medicine

## 2017-10-28 ENCOUNTER — Ambulatory Visit (INDEPENDENT_AMBULATORY_CARE_PROVIDER_SITE_OTHER): Payer: Medicare Other

## 2017-10-28 VITALS — BP 162/70 | HR 58 | Temp 98.6°F | Ht 61.0 in | Wt 154.0 lb

## 2017-10-28 VITALS — BP 110/60 | HR 53 | Temp 97.9°F | Ht 61.5 in | Wt 152.8 lb

## 2017-10-28 DIAGNOSIS — Z0000 Encounter for general adult medical examination without abnormal findings: Secondary | ICD-10-CM

## 2017-10-28 DIAGNOSIS — Z85048 Personal history of other malignant neoplasm of rectum, rectosigmoid junction, and anus: Secondary | ICD-10-CM

## 2017-10-28 DIAGNOSIS — E538 Deficiency of other specified B group vitamins: Secondary | ICD-10-CM

## 2017-10-28 DIAGNOSIS — R1032 Left lower quadrant pain: Secondary | ICD-10-CM | POA: Diagnosis not present

## 2017-10-28 DIAGNOSIS — I10 Essential (primary) hypertension: Secondary | ICD-10-CM

## 2017-10-28 DIAGNOSIS — Z85038 Personal history of other malignant neoplasm of large intestine: Secondary | ICD-10-CM | POA: Diagnosis not present

## 2017-10-28 DIAGNOSIS — E559 Vitamin D deficiency, unspecified: Secondary | ICD-10-CM

## 2017-10-28 DIAGNOSIS — K529 Noninfective gastroenteritis and colitis, unspecified: Secondary | ICD-10-CM | POA: Insufficient documentation

## 2017-10-28 DIAGNOSIS — D539 Nutritional anemia, unspecified: Secondary | ICD-10-CM | POA: Diagnosis not present

## 2017-10-28 LAB — COMPREHENSIVE METABOLIC PANEL
ALBUMIN: 4 g/dL (ref 3.5–5.2)
ALK PHOS: 65 U/L (ref 39–117)
ALT: 13 U/L (ref 0–35)
AST: 17 U/L (ref 0–37)
BILIRUBIN TOTAL: 0.7 mg/dL (ref 0.2–1.2)
BUN: 23 mg/dL (ref 6–23)
CALCIUM: 9.6 mg/dL (ref 8.4–10.5)
CO2: 27 meq/L (ref 19–32)
CREATININE: 1.07 mg/dL (ref 0.40–1.20)
Chloride: 99 mEq/L (ref 96–112)
GFR: 51.82 mL/min — AB (ref >60.00)
Glucose, Bld: 116 mg/dL — ABNORMAL HIGH (ref 70–99)
Potassium: 4.4 mEq/L (ref 3.5–5.1)
Sodium: 134 mEq/L — ABNORMAL LOW (ref 135–145)
TOTAL PROTEIN: 6.5 g/dL (ref 6.0–8.3)

## 2017-10-28 LAB — VITAMIN D 25 HYDROXY (VIT D DEFICIENCY, FRACTURES): VITD: 26.33 ng/mL — AB (ref 30.00–100.00)

## 2017-10-28 LAB — VITAMIN B12: Vitamin B-12: >1500 pg/mL — ABNORMAL HIGH (ref 211–911)

## 2017-10-28 NOTE — Progress Notes (Signed)
Subjective:   Priscilla [name] is a 84 y.o. female who presents for Medicare Annual (Subsequent) preventive examination.  Review of Systems:  N/A Cardiac Risk Factors include: advanced age (>33men, >34 women);hypertension     Objective:     Vitals: BP 110/60 (BP Location: Right Arm, Patient Position: Sitting, Cuff Size: Normal)   Pulse (!) 53   Temp 97.9 F (36.6 C) (Oral)   Ht 5' 1.5" (1.562 m) Comment: shoes  Wt 152 lb 12 oz (69.3 kg)   SpO2 98%   BMI 28.39 kg/m   Body mass index is 28.39 kg/m.  Advanced Directives 10/28/2017 10/31/2014 05/20/2012 02/05/2011 01/28/2011 01/28/2011  Does Patient Have a Medical Advance Directive? Yes No Patient has advance directive, copy not in chart Patient has advance directive, copy not in chart Patient has advance directive, copy not in chart Patient has advance directive, copy not in chart  Type of Advance Directive [place];Living will - - [place];Living will [place];Living will [place];Living will  Copy of [place] in Chart? Yes - - Copy requested from family Copy requested from family Copy requested from family  Would patient like information on creating a medical advance directive? - No - patient declined information - - - -  Pre-existing out of facility DNR order (yellow form or pink MOST form) - - - No No -    Tobacco Social History   Tobacco Use  Smoking Status Never Smoker  Smokeless Tobacco Never Used     Counseling given: No   Clinical Intake:  Pre-visit preparation completed: Yes  Pain : 0-10 Pain Score: 5  Pain Type: Acute pain Pain Location: Rib cage Pain Orientation: Left Pain Descriptors / Indicators: Throbbing Pain Onset: 1 to 4 weeks ago Pain Frequency: Intermittent     Nutritional Status: BMI 25 -29 Overweight Nutritional Risks: None Diabetes: No  How often do you need to have someone help you when you  read instructions, pamphlets, or other written materials from your doctor or pharmacy?: 1 - Never What is the last grade level you completed in school?: 12th grade  Interpreter Needed?: No  Comments: pt is a widow and lives alone Information entered by :: LPinson, LPN  Past Medical History:  Diagnosis Date  . Aortic insufficiency    mild to moderate on echo 2017  . B12 deficiency 03/25/2014   H/o small bowel obstruction with resection as well as colorectal cancer s/p partial colectomy.  Receives B12 injections   . Cancer ([place]) 2001, 2008   colon, rectal  . Chest pain, atypical    no ischemia on nuclear stress test 2017  . History of chicken pox   . History of Clostridium difficile 2012  . History of DVT (deep vein thrombosis) 2002   right leg, after chemo for first cancer, treated  . Hypertension   . IDA (iron deficiency anemia)   . Open wound of abdominal wall with complication   . Osteoarthritis   . Osteopenia 09/2011   DEXA 2013 spine -2.2, femur -1.6, DEXA 12/2015 - hip T score -1.9  . SBO (small bowel obstruction) ([place]) 02/05/2011   On 02/05/11 she had enterolysis and small bowel resection    Past Surgical History:  Procedure Laterality Date  . ABDOMINAL HYSTERECTOMY  1984  . APPENDECTOMY  1984  . BOWEL RESECTION  02/05/2011   Procedure: SMALL BOWEL RESECTION;  Surgeon: Harl Bowie, MD;  Location: [facility];  Service: General;  Laterality: N/A;  . broken nose    . CARDIOVASCULAR STRESS TEST  11/2015   WNL ([facility])  . COLECTOMY    . COLON SURGERY  2001 and 2008  . COLONOSCOPY  12/2012   erosions at colonic anastomosis, o/w normal rpt 5 yrs (Dr. Penelope Coop at [facility])  . COLOSTOMY    . DEXA  09/2011   T-score: spine -2.2, femur -1.6  . ESOPHAGOGASTRODUODENOSCOPY  01/31/2011   Procedure: ESOPHAGOGASTRODUODENOSCOPY (EGD);  Surgeon: Missy Sabins, MD;  Location: [facility] ENDOSCOPY;  Service: Endoscopy;  Laterality: N/A;  duodenal bx;s r/o giardia, r/o celiac diease  . EYE SURGERY   2018   cataract surgery  . EYE SURGERY  2019   laser surgery  . FLEXIBLE SIGMOIDOSCOPY  01/31/2011   Procedure: FLEXIBLE SIGMOIDOSCOPY;  Surgeon: Missy Sabins, MD;  Location: [facility];  Service: Endoscopy;  Laterality: N/A;  . ILEOSTOMY    . JOINT REPLACEMENT  2010   knee   . LAPAROTOMY  02/05/2011   Procedure: EXPLORATORY LAPAROTOMY;  Surgeon: Harl Bowie, MD;  Location: [facility];  Service: General;  Laterality: N/A;  lysis of adhesions.  . NUCLEAR STRESS TEST  11/17/08   NORMAL  . REPLACEMENT TOTAL KNEE  2010   RIGHT KNEE  . WOUND DEBRIDEMENT N/A 05/21/2012   Procedure: Excision non healing ABDOMINAL WOUND;  Surgeon: Harl Bowie, MD;  Location: [facility] OR;  Service: General;  Laterality: N/A;   Family History  Problem Relation Age of Onset  . Heart disease Mother        valve problems  . Heart attack Mother   . Congestive Heart Failure Mother   . Heart disease Father        due to lupus  . Lupus Father   . Heart attack Father   . Colon cancer Paternal Grandmother   . Coronary artery disease Brother        s/p CABG  . Coronary artery disease Sister        s/p CABG  . Heart attack Brother   . Heart attack Sister   . Diabetes Neg Hx   . Stroke Neg Hx   . Hypertension Neg Hx    Social History   Socioeconomic History  . Marital status: Widowed    Spouse name: Not on file  . Number of children: Not on file  . Years of education: Not on file  . Highest education level: Not on file  Occupational History  . Not on file  Social Needs  . Financial resource strain: Not on file  . Food insecurity:    Worry: Not on file    Inability: Not on file  . Transportation needs:    Medical: Not on file    Non-medical: Not on file  Tobacco Use  . Smoking status: Never Smoker  . Smokeless tobacco: Never Used  Substance and Sexual Activity  . Alcohol use: No  . Drug use: No  . Sexual activity: Not Currently  Lifestyle  . Physical activity:    Days per week: Not on  file    Minutes per session: Not on file  . Stress: Not on file  Relationships  . Social connections:    Talks on phone: Not on file    Gets together: Not on file    Attends religious service: Not on file    Active member of club or organization: Not on file    Attends meetings of clubs or organizations:  Not on file    Relationship status: Not on file  Other Topics Concern  . Not on file  Social History Narrative   Caffeine: decaf   Lives alone, daughter lives nearby to visit.   Occupation: retired, worked at [employer] office   Edu: HS   Activity: tries to get walking, walks up and down steps at home   Diet: good water, fruits/vegetables daily.  1 cup milk/day, cheese.      Cards: Dr. Mare Ferrari   Surgeon: Dr. Rush Farmer    Outpatient Encounter Medications as of 10/28/2017  Medication Sig  . aspirin 81 MG chewable tablet Chew 81 mg by mouth daily.  Marland Kitchen atenolol (TENORMIN) 50 MG tablet Take 1 tablet (50 mg total) by mouth 2 (two) times daily.  . Biotin 5 MG CAPS Take 1 capsule by mouth daily.  . cholecalciferol (VITAMIN D) 1000 UNITS tablet Take 2,000 Units by mouth daily.   . cyanocobalamin (,VITAMIN B-12,) 1000 MCG/ML injection Inject 1 mL (1,000 mcg total) into the muscle every 30 (thirty) days. Start 03/2014  . famotidine (PEPCID) 20 MG tablet One at bedtime  . ferrous sulfate 325 (65 FE) MG tablet Take 325 mg by mouth daily with breakfast.  . hydrochlorothiazide (HYDRODIURIL) 25 MG tablet One dialy  . irbesartan (AVAPRO) 300 MG tablet Take 1 tablet (300 mg total) by mouth daily.  . nitroGLYCERIN (NITROSTAT) 0.4 MG SL tablet Place 1 tablet (0.4 mg total) under the tongue every 5 (five) minutes as needed for chest pain.  . pantoprazole (PROTONIX) 40 MG tablet Take 1 tablet (40 mg total) by mouth daily. Take 30-60 min before first meal of the day  . [DISCONTINUED] CALCIUM-VITAMIN D PO Take 1 tablet by mouth 2 (two) times daily.   No facility-administered encounter medications on file as  of 10/28/2017.     Activities of Daily Living In your present state of health, do you have any difficulty performing the following activities: 10/28/2017  Hearing? N  Vision? N  Difficulty concentrating or making decisions? N  Walking or climbing stairs? Y  Dressing or bathing? N  Doing errands, shopping? N  Preparing Food and eating ? N  Using the Toilet? N  In the past six months, have you accidently leaked urine? N  Do you have problems with loss of bowel control? N  Managing your Medications? N  Managing your Finances? N  Housekeeping or managing your Housekeeping? N  Some recent data might be hidden    Patient Care Team: Ria Bush, MD as PCP - General (Family Medicine)    Assessment:   This is a routine wellness examination for Priscilla [name].   Hearing Screening   125Hz  250Hz  500Hz  1000Hz  2000Hz  3000Hz  4000Hz  6000Hz  8000Hz   Right ear:   40 40 40  40    Left ear:   40 40 40  40    Vision Screening Comments: Vision exam on 10/26/2017 with Dr. Satira Sark    Exercise Activities and Dietary recommendations Current Exercise Habits: The patient does not participate in regular exercise at present, Exercise limited by: None identified  Goals    . Patient Stated     Starting 10/28/2017, I will continue to take medications as prescribed.        Fall Risk Fall Risk  10/28/2017 09/29/2016 09/21/2015 09/19/2014 09/12/2013  Falls in the past year? Yes Yes Yes No No  Comment foot caught on vine in garden causing patient to fall - - - -  Number falls in past  yr: 1 1 2  or more - -  Injury with Fall? No - No - -   Depression Screen PHQ 2/9 Scores 10/28/2017 09/29/2016 09/21/2015 09/19/2014  PHQ - 2 Score 0 0 0 0  PHQ- 9 Score 0 - - -     Cognitive Function MMSE - Mini Mental State Exam 10/28/2017  Orientation to time 5  Orientation to Place 5  Registration 3  Attention/ Calculation 0  Recall 3  Language- name 2 objects 0  Language- repeat 1  Language- follow 3 step command 3  Language-  read & follow direction 0  Write a sentence 0  Copy design 0  Total score 20       PLEASE NOTE: A Mini-Cog screen was completed. Maximum score is 20. A value of 0 denotes this part of [place] MMSE was not completed or the patient failed this part of the Mini-Cog screening.   Mini-Cog Screening Orientation to Time - Max 5 pts Orientation to Place - Max 5 pts Registration - Max 3 pts Recall - Max 3 pts Language Repeat - Max 1 pts Language Follow 3 Step Command - Max 3 pts   Immunization History  Administered Date(s) Administered  . Influenza Split 12/11/2011, 12/23/2014  . Influenza, High Dose Seasonal PF 01/12/2017  . Influenza,inj,Quad PF,6+ Mos 12/25/2015  . Pneumococcal Conjugate-13 09/12/2013  . Pneumococcal Polysaccharide-23 03/25/2007  . Td 09/11/2011  . Zoster 05/09/2014    Screening Tests Health Maintenance  Topic Date Due  . INFLUENZA VACCINE  06/22/2018 (Originally 10/22/2017)  . DTaP/Tdap/Td (1 - Tdap) 09/10/2021 (Originally 09/12/2011)  . COLONOSCOPY  01/17/2018  . MAMMOGRAM  01/28/2018  . TETANUS/TDAP  09/10/2021  . DEXA SCAN  Completed  . PNA vac Low Risk Adult  Completed       Plan:   I have personally reviewed, addressed, and noted the following in the patient's chart:  A. Medical and social history B. Use of alcohol, tobacco or illicit drugs  C. Current medications and supplements D. Functional ability and status E.  Nutritional status F.  Physical activity G. Advance directives H. List of other physicians I.  Hospitalizations, surgeries, and ER visits in previous 12 months J.  [place] to include hearing, vision, cognitive, depression L. Referrals and appointments - none  In addition, I have reviewed and discussed with patient certain preventive protocols, quality metrics, and best practice recommendations. A written personalized care plan for preventive services as well as general preventive health recommendations were provided to  patient.  See attached scanned questionnaire for additional information.   Signed,   Lindell Noe, MHA, BS, LPN Health Coach

## 2017-10-28 NOTE — Assessment & Plan Note (Signed)
Colonoscopy 2014 without diverticulosis however she did have erosions around anastomosis at that time.  Concerning ongoing abd discomfort in her GI history however no red flags today. Check CBC, lipase today. Recent CMP earlier today was reassuring. She does not have acute abdomen today. Discussed possible CT abd/pelvis for further evaluation - pending lab results. She would be due for colonoscopy 12/2017. Will go ahead and refer back to GI for further evaluation of left sided abd pain Priscilla [name]).

## 2017-10-28 NOTE — Patient Instructions (Addendum)
Labs today I do want to send you back to Dr Penelope Coop for further evaluation of abdominal discomfort.  We will be in touch with results. We may set you up with CT abdomen/pelvis with contrast for further evaluation pending blood work results.

## 2017-10-28 NOTE — Patient Instructions (Signed)
Ms. Moxley , Thank you for taking time to come for your Medicare Wellness Visit. I appreciate your ongoing commitment to your health goals. Please review the following plan we discussed and let me know if I can assist you in the future.   These are the goals we discussed: Goals    . Patient Stated     Starting 10/28/2017, I will continue to take medications as prescribed.        This is a list of the screening recommended for you and due dates:  Health Maintenance  Topic Date Due  . Flu Shot  06/22/2018*  . DTaP/Tdap/Td vaccine (1 - Tdap) 09/10/2021*  . Colon Cancer Screening  01/17/2018  . Mammogram  01/28/2018  . Tetanus Vaccine  09/10/2021  . DEXA scan (bone density measurement)  Completed  . Pneumonia vaccines  Completed  *Topic was postponed. The date shown is not the original due date.   Preventive Care for Adults  A healthy lifestyle and preventive care can promote health and wellness. Preventive health guidelines for adults include the following key practices.  . A routine yearly physical is a good way to check with your health care provider about your health and preventive screening. It is a chance to share any concerns and updates on your health and to receive a thorough exam.  . Visit your dentist for a routine exam and preventive care every 6 months. Brush your teeth twice a day and floss once a day. Good oral hygiene prevents tooth decay and gum disease.  . The frequency of eye exams is based on your age, health, family medical history, use  of contact lenses, and other factors. Follow your health care provider's recommendations for frequency of eye exams.  . Eat a healthy diet. Foods like vegetables, fruits, whole grains, low-fat dairy products, and lean protein foods contain the nutrients you need without too many calories. Decrease your intake of foods high in solid fats, added sugars, and salt. Eat the right amount of calories for you. Get information about a proper  diet from your health care provider, if necessary.  . Regular physical exercise is one of the most important things you can do for your health. Most adults should get at least 150 minutes of moderate-intensity exercise (any activity that increases your heart rate and causes you to sweat) each week. In addition, most adults need muscle-strengthening exercises on 2 or more days a week.  Silver Sneakers may be a benefit available to you. To determine eligibility, you may visit the website: www.silversneakers.com or contact program at (531)150-7165 Mon-Fri between 8AM-8PM.   . Maintain a healthy weight. The body mass index (BMI) is a screening tool to identify possible weight problems. It provides an estimate of body fat based on height and weight. Your health care provider can find your BMI and can help you achieve or maintain a healthy weight.   For adults 20 years and older: ? A BMI below 18.5 is considered underweight. ? A BMI of 18.5 to 24.9 is normal. ? A BMI of 25 to 29.9 is considered overweight. ? A BMI of 30 and above is considered obese.   . Maintain normal blood lipids and cholesterol levels by exercising and minimizing your intake of saturated fat. Eat a balanced diet with plenty of fruit and vegetables. Blood tests for lipids and cholesterol should begin at age 22 and be repeated every 5 years. If your lipid or cholesterol levels are high, you are over 50,  or you are at high risk for heart disease, you may need your cholesterol levels checked more frequently. Ongoing high lipid and cholesterol levels should be treated with medicines if diet and exercise are not working.  . If you smoke, find out from your health care provider how to quit. If you do not use tobacco, please do not start.  . If you choose to drink alcohol, please do not consume more than 2 drinks per day. One drink is considered to be 12 ounces (355 mL) of beer, 5 ounces (148 mL) of wine, or 1.5 ounces (44 mL) of  liquor.  . If you are 5-61 years old, ask your health care provider if you should take aspirin to prevent strokes.  . Use sunscreen. Apply sunscreen liberally and repeatedly throughout the day. You should seek shade when your shadow is shorter than you. Protect yourself by wearing long sleeves, pants, a wide-brimmed hat, and sunglasses year round, whenever you are outdoors.  . Once a month, do a whole body skin exam, using a mirror to look at the skin on your back. Tell your health care provider of new moles, moles that have irregular borders, moles that are larger than a pencil eraser, or moles that have changed in shape or color.

## 2017-10-28 NOTE — Assessment & Plan Note (Addendum)
Recent B12 level was drawn after B12 shot - unreliable.

## 2017-10-28 NOTE — Progress Notes (Signed)
BP (!) 162/70 (BP Location: Right Arm, Patient Position: Sitting, Cuff Size: Normal)   Pulse (!) 58   Temp 98.6 F (37 C) (Oral)   Ht 5\' 1"  (1.549 m)   Wt 154 lb (69.9 kg)   SpO2 96%   BMI 29.10 kg/m   On recheck: bp sitting 220/78, standing 190/68 CC: abd pain Subjective:    Patient ID: Priscilla [name], female    DOB: January 15, 1931, 84 y.o.   MRN: [id number]  HPI: Priscilla [name] is a 84 y.o. female presenting on 10/28/2017 for Abdominal Pain (C/o LUQ intermittent pain. Started about 2 wks ago. )   2 wk h/o LUQ abd discomfort described as dull ache, not sharp stabbing. Currently has mild dull pain. No aggravating or alleviating factors. H/o chronic diarrhea after bowel surgeries - she controls this with daily metamucil and miralax.   No fevers/chills, nausea/vomiting, constipation, blood in stool, gassiness or bloating, or boring pain to back, no appetite changes or unexpected weight loss. No urinary changes. Having regular bowel movements, passing gas ok.   GERD managed with protonix 40mg  daily and pepcid 20mg  at bedtime (started 6 wks ago).  She takes iron tablet daily.   Compliant with antihypertensives - atenolol 50mg  bid, irbesartan 300mg , hctz 25mg  daily (recently doubled).    Known h/o colon cancer Has had several abdominal surgeries (hysterectomy/appendectomy 1984, colon surgery for cancer 2001, 2008 (with ileostomy), bowel resection 2012, and prolonged non healing wound s/p debridement 2014).  She has h/o intestinal obstruction from adhesions, needed prolonged hospitalization and surgery 2012.  COLONOSCOPY 12/2012 - erosions at colonic anastomosis, o/w normal rpt 5 yrs (Dr. Penelope Coop at [facility] some low bp readings 94/45 at home with associated fatigue.   Relevant past medical, surgical, family and social history reviewed and updated as indicated. Interim medical history since our last visit reviewed. Allergies and medications reviewed and updated. Outpatient  Medications Prior to Visit  Medication Sig Dispense Refill  . aspirin 81 MG chewable tablet Chew 81 mg by mouth daily.    Marland Kitchen atenolol (TENORMIN) 50 MG tablet Take 1 tablet (50 mg total) by mouth 2 (two) times daily. 180 tablet 3  . Biotin 5 MG CAPS Take 1 capsule by mouth daily.    . cholecalciferol (VITAMIN D) 1000 UNITS tablet Take 2,000 Units by mouth daily.     . cyanocobalamin (,VITAMIN B-12,) 1000 MCG/ML injection Inject 1 mL (1,000 mcg total) into the muscle every 30 (thirty) days. Start 03/2014 1 mL 0  . famotidine (PEPCID) 20 MG tablet One at bedtime 30 tablet 11  . ferrous sulfate 325 (65 FE) MG tablet Take 325 mg by mouth daily with breakfast.    . hydrochlorothiazide (HYDRODIURIL) 25 MG tablet One dialy 30 tablet 11  . irbesartan (AVAPRO) 300 MG tablet Take 1 tablet (300 mg total) by mouth daily. 90 tablet 3  . nitroGLYCERIN (NITROSTAT) 0.4 MG SL tablet Place 1 tablet (0.4 mg total) under the tongue every 5 (five) minutes as needed for chest pain. 25 tablet 1  . pantoprazole (PROTONIX) 40 MG tablet Take 1 tablet (40 mg total) by mouth daily. Take 30-60 min before first meal of the day 30 tablet 2   No facility-administered medications prior to visit.      Per HPI unless specifically indicated in ROS section below Review of Systems     Objective:    BP (!) 162/70 (BP Location: Right Arm, Patient Position: Sitting, Cuff Size: Normal)   Pulse Marland Kitchen)  58   Temp 98.6 F (37 C) (Oral)   Ht 5\' 1"  (1.549 m)   Wt 154 lb (69.9 kg)   SpO2 96%   BMI 29.10 kg/m   Wt Readings from Last 3 Encounters:  10/28/17 154 lb (69.9 kg)  10/28/17 152 lb 12 oz (69.3 kg)  09/30/17 152 lb (68.9 kg)    Physical Exam  Constitutional: She appears well-developed and well-nourished. No distress.  HENT:  Mouth/Throat: Oropharynx is clear and moist. No oropharyngeal exudate.  Neck: No thyromegaly present.  Cardiovascular: Normal rate, regular rhythm and normal heart sounds.  No murmur  heard. Pulmonary/Chest: Effort normal and breath sounds normal. No respiratory distress. She has no wheezes. She has no rales.  Abdominal: Soft. Normal appearance and bowel sounds are normal. She exhibits no distension and no mass. There is no hepatosplenomegaly. There is tenderness (generalized, predominantly LLQ) in the left lower quadrant. There is no rebound, no guarding, no CVA tenderness and negative Murphy's sign. No hernia.  Musculoskeletal: She exhibits no edema.  Skin: Skin is warm. No rash noted.  Psychiatric: She has a normal mood and affect.  Nursing note and vitals reviewed.    Results for orders placed or performed in visit on 10/28/17  VITAMIN D 25 Hydroxy (Vit-D Deficiency, Fractures)  Result Value Ref Range   VITD 26.33 (L) 30.00 - 100.00 ng/mL  Vitamin B12  Result Value Ref Range   Vitamin B-12 >1500 (H) 211 - 911 pg/mL  Comprehensive metabolic panel  Result Value Ref Range   Sodium 134 (L) 135 - 145 mEq/L   Potassium 4.4 3.5 - 5.1 mEq/L   Chloride 99 96 - 112 mEq/L   CO2 27 19 - 32 mEq/L   Glucose, Bld 116 (H) 70 - 99 mg/dL   BUN 23 6 - 23 mg/dL   Creatinine, Ser 1.07 0.40 - 1.20 mg/dL   Total Bilirubin 0.7 0.2 - 1.2 mg/dL   Alkaline Phosphatase 65 39 - 117 U/L   AST 17 0 - 37 U/L   ALT 13 0 - 35 U/L   Total Protein 6.5 6.0 - 8.3 g/dL   Albumin 4.0 3.5 - 5.2 g/dL   Calcium 9.6 8.4 - 10.5 mg/dL   GFR 51.82 (L) >60.00 mL/min   Vit B12 shot today was done before today's labwork.     Assessment & Plan:   Problem List Items Addressed This Visit    LLQ abdominal pain - Primary    Colonoscopy 2014 without diverticulosis however she did have erosions around anastomosis at that time.  Concerning ongoing abd discomfort in her GI history however no red flags today. Check CBC, lipase today. Recent CMP earlier today was reassuring. She does not have acute abdomen today. Discussed possible CT abd/pelvis for further evaluation - pending lab results. She would be due for  colonoscopy 12/2017. Will go ahead and refer back to GI for further evaluation of left sided abd pain Penelope Coop).       Relevant Orders   CBC with Differential/Platelet   Lipase   Ambulatory referral to Gastroenterology   History of rectal cancer   History of colon cancer   Essential hypertension    Elevated blood pressure again today. However she does endorse symptomatic low readings at home. She has 30 point systolic drop from sitting to standing, but does remain hypertensive. Will not make changes for now. I did encourage good hydration status.  Will reassess at f/u visit next week.  Pt agrees with plan.  B12 deficiency    Recent B12 level was drawn after B12 shot - unreliable.           No orders of the defined types were placed in this encounter.  Orders Placed This Encounter  Procedures  . CBC with Differential/Platelet  . Lipase  . Ambulatory referral to Gastroenterology    Referral Priority:   Routine    Referral Type:   Consultation    Referral Reason:   Specialty Services Required    Number of Visits Requested:   1    Follow up plan: Return if symptoms worsen or fail to improve.  Ria Bush, MD

## 2017-10-28 NOTE — Progress Notes (Signed)
PCP notes:   Health maintenance:   Flu vaccine - addressed  Abnormal screenings:   Fall risk - hx of single fall Fall Risk  10/28/2017 09/29/2016 09/21/2015 09/19/2014 09/12/2013  Falls in the past year? Yes Yes Yes No No  Comment foot caught on vine in garden causing patient to fall - - - -  Number falls in past yr: 1 1 2  or more - -  Injury with Fall? No - No - -    Patient concerns:   Lower right quadrant pain below ribcage - acute onset within in previous 2 weeks, intermittent, recurrent. Pain scale: 5-6/10. Patient denies injury to area or presence of redness and/or swelling. PCP notified. Same day acute appt scheduled.   Nurse concerns:  B12 injection administered in left deltoid per PCP order.   Next PCP appt:   10/28/17 @ 1700

## 2017-10-28 NOTE — Assessment & Plan Note (Signed)
Elevated blood pressure again today. However she does endorse symptomatic low readings at home. She has 30 point systolic drop from sitting to standing, but does remain hypertensive. Will not make changes for now. I did encourage good hydration status.  Will reassess at f/u visit next week.  Pt agrees with plan.

## 2017-10-29 LAB — CBC WITH DIFFERENTIAL/PLATELET
BASOS PCT: 0.3 % (ref 0.0–3.0)
Basophils Absolute: 0 10*3/uL (ref 0.0–0.1)
EOS ABS: 0.1 10*3/uL (ref 0.0–0.7)
Eosinophils Relative: 2.1 % (ref 0.0–5.0)
HEMATOCRIT: 32.6 % — AB (ref 36.0–46.0)
Hemoglobin: 11.2 g/dL — ABNORMAL LOW (ref 12.0–15.0)
LYMPHS ABS: 1.5 10*3/uL (ref 0.7–4.0)
LYMPHS PCT: 28.3 % (ref 12.0–46.0)
MCHC: 34.3 g/dL (ref 30.0–36.0)
MCV: 92.2 fl (ref 78.0–100.0)
MONOS PCT: 11.1 % (ref 3.0–12.0)
Monocytes Absolute: 0.6 10*3/uL (ref 0.1–1.0)
NEUTROS PCT: 58.2 % (ref 43.0–77.0)
Neutro Abs: 3 10*3/uL (ref 1.4–7.7)
Platelets: 326 10*3/uL (ref 150.0–400.0)
RBC: 3.54 Mil/uL — ABNORMAL LOW (ref 3.87–5.11)
RDW: 12.3 % (ref 11.5–15.5)
WBC: 5.2 10*3/uL (ref 4.0–10.5)

## 2017-10-29 LAB — LIPASE: Lipase: 51 U/L (ref 11.0–59.0)

## 2017-10-30 ENCOUNTER — Other Ambulatory Visit: Payer: Self-pay | Admitting: Family Medicine

## 2017-10-30 DIAGNOSIS — R1032 Left lower quadrant pain: Secondary | ICD-10-CM

## 2017-10-30 DIAGNOSIS — Z85048 Personal history of other malignant neoplasm of rectum, rectosigmoid junction, and anus: Secondary | ICD-10-CM

## 2017-10-30 DIAGNOSIS — Z85038 Personal history of other malignant neoplasm of large intestine: Secondary | ICD-10-CM

## 2017-10-30 MED ORDER — CYANOCOBALAMIN 1000 MCG/ML IJ SOLN
1000.0000 ug | Freq: Once | INTRAMUSCULAR | Status: AC
  Administered 2017-10-28: 1000 ug via INTRAMUSCULAR

## 2017-10-31 NOTE — Progress Notes (Signed)
   Subjective:    Patient ID: Priscilla [name], female    DOB: [birth year], 84 y.o.   MRN: [id number]  HPI I reviewed health advisor's note, was available for consultation, and agree with documentation and plan.    Review of Systems     Objective:   Physical Exam         Assessment & Plan:

## 2017-11-02 ENCOUNTER — Ambulatory Visit (INDEPENDENT_AMBULATORY_CARE_PROVIDER_SITE_OTHER)
Admission: RE | Admit: 2017-11-02 | Discharge: 2017-11-02 | Disposition: A | Discharge status: Home / Self Care | Payer: Medicare Other | Source: Ambulatory Visit | Attending: Family Medicine | Admitting: Family Medicine

## 2017-11-02 DIAGNOSIS — Z85038 Personal history of other malignant neoplasm of large intestine: Secondary | ICD-10-CM | POA: Diagnosis not present

## 2017-11-02 DIAGNOSIS — R1032 Left lower quadrant pain: Secondary | ICD-10-CM | POA: Diagnosis not present

## 2017-11-02 DIAGNOSIS — Z85048 Personal history of other malignant neoplasm of rectum, rectosigmoid junction, and anus: Secondary | ICD-10-CM

## 2017-11-02 MED ORDER — IOPAMIDOL (ISOVUE-300) INJECTION 61%
70.0000 mL | Freq: Once | INTRAVENOUS | Status: AC | PRN
  Administered 2017-11-02: 70 mL via INTRAVENOUS

## 2017-11-04 ENCOUNTER — Ambulatory Visit (INDEPENDENT_AMBULATORY_CARE_PROVIDER_SITE_OTHER): Payer: Medicare Other | Admitting: Family Medicine

## 2017-11-04 ENCOUNTER — Encounter: Payer: Self-pay | Admitting: Family Medicine

## 2017-11-04 VITALS — BP 132/62 | HR 61 | Temp 98.5°F | Ht 61.5 in | Wt 152.5 lb

## 2017-11-04 DIAGNOSIS — Z85038 Personal history of other malignant neoplasm of large intestine: Secondary | ICD-10-CM

## 2017-11-04 DIAGNOSIS — I10 Essential (primary) hypertension: Secondary | ICD-10-CM | POA: Diagnosis not present

## 2017-11-04 DIAGNOSIS — E559 Vitamin D deficiency, unspecified: Secondary | ICD-10-CM

## 2017-11-04 DIAGNOSIS — D539 Nutritional anemia, unspecified: Secondary | ICD-10-CM

## 2017-11-04 DIAGNOSIS — Z7189 Other specified counseling: Secondary | ICD-10-CM

## 2017-11-04 DIAGNOSIS — K529 Noninfective gastroenteritis and colitis, unspecified: Secondary | ICD-10-CM

## 2017-11-04 DIAGNOSIS — Z85048 Personal history of other malignant neoplasm of rectum, rectosigmoid junction, and anus: Secondary | ICD-10-CM | POA: Diagnosis not present

## 2017-11-04 DIAGNOSIS — E538 Deficiency of other specified B group vitamins: Secondary | ICD-10-CM

## 2017-11-04 MED ORDER — VITAMIN D3 1.25 MG (50000 UT) PO TABS: 1.0000 | ORAL_TABLET | ORAL | 1 refills | Status: DC

## 2017-11-04 MED ORDER — HYDROCHLOROTHIAZIDE 12.5 MG PO TABS: 12.5000 mg | ORAL_TABLET | Freq: Two times a day (BID) | ORAL | 11 refills | Status: DC

## 2017-11-04 NOTE — Assessment & Plan Note (Signed)
Chronic, well controlled in office today, however pt endorses low readings at home to 90/40s. Will change hctz to 12.5mg  BID, continue avapro. If persistent low readings, rec decrease back to hctz 12.5mg  once daily. Pt will monitor BP at home and return if ongoing trouble despite above changes. Pt reliable, agrees with plan.

## 2017-11-04 NOTE — Assessment & Plan Note (Signed)
CT showed some distal small intestine wall thickening consistent with inflammatory or infectious enteritis. ?viral enteritis. Overall patient symptoms have improved - declines earlier f/u with GI at this time. Will forward CT results and note attn Dr Penelope Coop as Juluis Rainier.

## 2017-11-04 NOTE — Assessment & Plan Note (Signed)
Persistently low despite compliance with 2000 IU daily. Will Rx 50,000 IU weekly x 3 months then return to 2000 IU daily.

## 2017-11-04 NOTE — Progress Notes (Signed)
BP 132/62 (BP Location: Left Arm, Patient Position: Sitting, Cuff Size: Normal)   Pulse 61   Temp 98.5 F (36.9 C) (Oral)   Ht 5' 1.5" (1.562 m)   Wt 152 lb 8 oz (69.2 kg)   SpO2 96%   BMI 28.35 kg/m    CC: AMW f/u visit Subjective:    Patient ID: Priscilla [name], female    DOB: [birth year], 84 y.o.   MRN: [id number]  HPI: Priscilla [name] is a 84 y.o. female presenting on 11/04/2017 for Annual Exam (Pt 2.)   See prior note for details. Seen here last week with 2 wk of left sided abd discomfort, labs were unrevealing. CT abd/pelvis returned showing distal small bowel marked wall thickening thought consistent with inflammatory or infectious process without obstruction. We referred back to Dr Penelope Coop in interim - she would like to cancel this appt as she is feeling better.   Recent HCTZ increase in dose.  BP check at home today -97/43. Has had several low readings at home.    Known h/o colon cancer Has had several abdominal surgeries (hysterectomy/appendectomy 1984, colon surgery for cancer 2001, 2008 (with ileostomy), bowel resection 2012, and prolonged non healing wound s/p debridement 2014).  She has h/o intestinal obstruction from adhesions, needed prolonged hospitalization and surgery 2012. COLONOSCOPY 12/2012 - erosions at colonic anastomosis, o/w normal rpt 5 yrs (Dr. Penelope Coop at [place])  [name] last week for medicare wellness visit. Note reviewed.   Preventative: H/o colon and rectal cancer. H/o bowel resection after SBO.  COLONOSCOPY Date: 12/2012 erosions at colonic anastomosis, o/w normal rpt 5 yrs (Dr. Penelope Coop at [place]). Well woman - has aged out of pap smears.  Mammogram - 01/2017 WNL.  DEXA 12/2015 - hip T score -1.9  Flu - yearly Td - 08/2011  peumovax 2009. prevnar 08/2013 zostavax 04/2014 shingrix - discussed.  Advanced directives - copy in chart 08/2015. HCPOA would be son Clare Gandy) and daughter Lelan Pons), also would be ok with other children helping. Ok with CPR, intubation  and limited trial for reversible conditions, does not want prolonged life support if terminal condition.  Seat belt use discussed Sunscreen use discussed. No changing moles on skin. Non smoker Alcohol - none Dentist - yearly Eye exam - yearly  Caffeine: decaf  Lives alone, daughter lives nearby to visit. Widow for 16 yrs  Occupation: retired, worked at [employer] office  Edu: HS  Activity: yardwork, walks up and down steps at home  Diet: good water, fruits/vegetables daily. 1 cup milk/day, cheese   Relevant past medical, surgical, family and social history reviewed and updated as indicated. Interim medical history since our last visit reviewed. Allergies and medications reviewed and updated. Outpatient Medications Prior to Visit  Medication Sig Dispense Refill  . aspirin 81 MG chewable tablet Chew 81 mg by mouth daily.    Marland Kitchen atenolol (TENORMIN) 50 MG tablet Take 1 tablet (50 mg total) by mouth 2 (two) times daily. 180 tablet 3  . cholecalciferol (VITAMIN D) 1000 UNITS tablet Take 2,000 Units by mouth daily.     . cyanocobalamin (,VITAMIN B-12,) 1000 MCG/ML injection Inject 1 mL (1,000 mcg total) into the muscle every 30 (thirty) days. Start 03/2014 1 mL 0  . famotidine (PEPCID) 20 MG tablet One at bedtime 30 tablet 11  . ferrous sulfate 325 (65 FE) MG tablet Take 325 mg by mouth daily with breakfast.    . irbesartan (AVAPRO) 300 MG tablet Take 1 tablet (300 mg total) by mouth  daily. 90 tablet 3  . nitroGLYCERIN (NITROSTAT) 0.4 MG SL tablet Place 1 tablet (0.4 mg total) under the tongue every 5 (five) minutes as needed for chest pain. 25 tablet 1  . pantoprazole (PROTONIX) 40 MG tablet Take 1 tablet (40 mg total) by mouth daily. Take 30-60 min before first meal of the day 30 tablet 2  . Biotin 5 MG CAPS Take 1 capsule by mouth daily.    . hydrochlorothiazide (HYDRODIURIL) 25 MG tablet One dialy 30 tablet 11   No facility-administered medications prior to visit.      Per HPI unless  specifically indicated in ROS section below Review of Systems     Objective:    BP 132/62 (BP Location: Left Arm, Patient Position: Sitting, Cuff Size: Normal)   Pulse 61   Temp 98.5 F (36.9 C) (Oral)   Ht 5' 1.5" (1.562 m)   Wt 152 lb 8 oz (69.2 kg)   SpO2 96%   BMI 28.35 kg/m   Wt Readings from Last 3 Encounters:  11/04/17 152 lb 8 oz (69.2 kg)  10/28/17 154 lb (69.9 kg)  10/28/17 152 lb 12 oz (69.3 kg)    Physical Exam  Constitutional: She is oriented to person, place, and time. She appears well-developed and well-nourished. No distress.  HENT:  Head: Normocephalic and atraumatic.  Right Ear: Hearing, tympanic membrane, external ear and ear canal normal.  Left Ear: Hearing, tympanic membrane, external ear and ear canal normal.  Nose: Nose normal.  Mouth/Throat: Uvula is midline, oropharynx is clear and moist and mucous membranes are normal. No oropharyngeal exudate, posterior oropharyngeal edema or posterior oropharyngeal erythema.  Eyes: Pupils are equal, round, and reactive to light. Conjunctivae and EOM are normal. No scleral icterus.  Neck: Normal range of motion. Neck supple. Carotid bruit is not present. No thyromegaly present.  Cardiovascular: Normal rate, regular rhythm, normal heart sounds and intact distal pulses.  No murmur heard. Pulses:      Radial pulses are 2+ on the right side, and 2+ on the left side.  Pulmonary/Chest: Effort normal and breath sounds normal. No respiratory distress. She has no wheezes. She has no rales.  Abdominal: Soft. Bowel sounds are normal. She exhibits no distension and no mass. There is no tenderness. There is no rebound and no guarding.  Musculoskeletal: Normal range of motion. She exhibits no edema.  Lymphadenopathy:    She has no cervical adenopathy.  Neurological: She is alert and oriented to person, place, and time.  CN grossly intact, station and gait intact  Skin: Skin is warm and dry. No rash noted.  Psychiatric: She has a  normal mood and affect. Her behavior is normal. Judgment and thought content normal.  Nursing note and vitals reviewed.  Results for orders placed or performed in visit on 10/28/17  CBC with Differential/Platelet  Result Value Ref Range   WBC 5.2 4.0 - 10.5 K/uL   RBC 3.54 (L) 3.87 - 5.11 Mil/uL   Hemoglobin 11.2 (L) 12.0 - 15.0 g/dL   HCT 32.6 (L) 36.0 - 46.0 %   MCV 92.2 78.0 - 100.0 fl   MCHC 34.3 30.0 - 36.0 g/dL   RDW 12.3 11.5 - 15.5 %   Platelets 326.0 150.0 - 400.0 K/uL   Neutrophils Relative % 58.2 43.0 - 77.0 %   Lymphocytes Relative 28.3 12.0 - 46.0 %   Monocytes Relative 11.1 3.0 - 12.0 %   Eosinophils Relative 2.1 0.0 - 5.0 %   Basophils Relative  0.3 0.0 - 3.0 %   Neutro Abs 3.0 1.4 - 7.7 K/uL   Lymphs Abs 1.5 0.7 - 4.0 K/uL   Monocytes Absolute 0.6 0.1 - 1.0 K/uL   Eosinophils Absolute 0.1 0.0 - 0.7 K/uL   Basophils Absolute 0.0 0.0 - 0.1 K/uL  Lipase  Result Value Ref Range   Lipase 51.0 11.0 - 59.0 U/L   Lab Results  Component Value Date   CREATININE 1.07 10/28/2017   BUN 23 10/28/2017   NA 134 (L) 10/28/2017   K 4.4 10/28/2017   CL 99 10/28/2017   CO2 27 10/28/2017    CT Abdomen Pelvis W Contrast CLINICAL DATA:  Left lower quadrant abdominal pain for 2 months. Remote history of rectal cancer in 2001 and colon cancer in 2008 status post partial colectomy.  EXAM: CT ABDOMEN AND PELVIS WITH CONTRAST  TECHNIQUE: Multidetector CT imaging of the abdomen and pelvis was performed using the standard protocol following bolus administration of intravenous contrast.  CONTRAST:  56mL ISOVUE-300 IOPAMIDOL (ISOVUE-300) INJECTION 61%  COMPARISON:  CT scan 01/26/2011  FINDINGS: Lower chest: The lung bases are clear of an acute process. No worrisome pulmonary lesions. The heart is normal in size. No pericardial effusion. There is a small hiatal hernia.  Hepatobiliary: No focal hepatic lesions to suggest metastatic disease. No intra or extrahepatic biliary  dilatation. The gallbladder is normal. No common bile duct dilatation.  Pancreas: No mass, inflammation or ductal dilatation.  Spleen: Normal size.  No focal lesions.  Adrenals/Urinary Tract: The adrenal glands and kidneys are unremarkable. Small renal cysts are noted. No worrisome renal lesions or hydronephrosis. No obstructing ureteral calculi or bladder calculi. No worrisome bladder lesions.  Stomach/Bowel: The stomach and duodenum are unremarkable. The proximal small bowel appears normal. The mid distal small bowel demonstrates an area of fairly marked wall thickening. There also postoperative changes with matted appearing loops of small bowel and areas of bowel wall thickening and mucosal fold thickening. I do not see a discrete mass or tumor. Findings more likely due to inflammatory or infectious process. No evidence of obstruction. Contrast gets all the way through the colon and down into the rectum. Surgical changes involving the rectum without evidence for recurrent tumor.  Vascular/Lymphatic: The aorta and branch vessels are patent. Atherosclerotic calcifications but no aneurysm or dissection. The major venous structures are patent. Numerous small scattered mesenteric and retroperitoneal lymph nodes but now mass or overt adenopathy.  Reproductive: Surgically absent.  Other: No ascites or subcutaneous lesions.  Musculoskeletal: No significant bony findings.  IMPRESSION: 1. Surgical changes involving the colon but no findings for colonic lesion or obstruction. 2. Thickened mid distal small bowel loops suggesting an inflammatory or infectious process without definite tumor. No evidence of obstruction. If symptoms persist or worsen it may be worthwhile repeating the CT scan in 3 months using the enterography protocol. 3. No findings for hepatic metastatic disease or abdominal/pelvic lymphadenopathy. 4. Small hiatal hernia.  Electronically Signed   By: Marijo Sanes M.D.   On: 11/02/2017 15:42      Assessment & Plan:   Problem List Items Addressed This Visit    Vitamin D deficiency    Persistently low despite compliance with 2000 IU daily. Will Rx 50,000 IU weekly x 3 months then return to 2000 IU daily.       History of rectal cancer   History of colon cancer   Essential hypertension    Chronic, well controlled in office today, however pt  endorses low readings at home to 90/40s. Will change hctz to 12.5mg  BID, continue avapro. If persistent low readings, rec decrease back to hctz 12.5mg  once daily. Pt will monitor BP at home and return if ongoing trouble despite above changes. Pt reliable, agrees with plan.       Relevant Medications   hydrochlorothiazide (HYDRODIURIL) 12.5 MG tablet   Enteritis - Primary    CT showed some distal small intestine wall thickening consistent with inflammatory or infectious enteritis. ?viral enteritis. Overall patient symptoms have improved - declines earlier f/u with GI at this time. Will forward CT results and note attn Dr Penelope Coop as Juluis Rainier.       B12 deficiency    Recent level unreliable (done after shot). Continue monthly b12 shots       Anemia associated with nutritional deficiency    Continue iron and b12       Advanced care planning/counseling discussion    Advanced directives - copy in chart 08/2015. HCPOA would be son Clare Gandy) and daughter Lelan Pons), also would be ok with other children helping. Ok with CPR, intubation and limited trial for reversible conditions, does not want prolonged life support if terminal condition.           Meds ordered this encounter  Medications  . Cholecalciferol (VITAMIN D3) 50000 units TABS    Sig: Take 1 tablet by mouth once a week.    Dispense:  12 tablet    Refill:  1  . DISCONTD: hydrochlorothiazide (HYDRODIURIL) 12.5 MG tablet    Sig: Take 1 tablet (12.5 mg total) by mouth 2 (two) times daily.    Dispense:  60 tablet    Refill:  11  . hydrochlorothiazide  (HYDRODIURIL) 12.5 MG tablet    Sig: Take 1 tablet (12.5 mg total) by mouth 2 (two) times daily.    Dispense:  60 tablet    Refill:  11    Hold until next refill due - informative purposes only - new dose   No orders of the defined types were placed in this encounter.   Follow up plan: Return in about 1 year (around 11/05/2018) for medicare wellness visit, follow up visit.  Ria Bush, MD

## 2017-11-04 NOTE — Patient Instructions (Addendum)
Ok to stop biotin. Take vitamin D 50,000 units weekly for 3 months then return to 2000 units daily.  If interested, check with pharmacy about new 2 shot shingles series (shingrix).  I'm glad you are doing better.  Try taking hctz 12.5mg  twice daily. If ongoing trouble with low blood pressure, decrease to once daily.  Monitor blood pressures - and come in sooner if too low or too high. Otherwise, return in 1 year for next wellness visit and follow up Continue monthly B12 shots

## 2017-11-04 NOTE — Assessment & Plan Note (Addendum)
Recent level unreliable (done after shot). Continue monthly b12 shots

## 2017-11-04 NOTE — Assessment & Plan Note (Signed)
Continue iron and b12

## 2017-11-04 NOTE — Assessment & Plan Note (Addendum)
Advanced directives - copy in chart 08/2015. HCPOA would be son Clare Gandy) and daughter Lelan Pons), also would be ok with other children helping. Ok with CPR, intubation and limited trial for reversible conditions, does not want prolonged life support if terminal condition.

## 2017-11-12 ENCOUNTER — Ambulatory Visit: Payer: Medicare Other | Admitting: Internal Medicine

## 2017-11-16 ENCOUNTER — Telehealth: Payer: Self-pay | Admitting: Family Medicine

## 2017-11-16 NOTE — Telephone Encounter (Signed)
Noted  

## 2017-11-16 NOTE — Telephone Encounter (Signed)
Copied from [place] [id number]. Topic: Referral - Status >> Nov 16, 2017  9:55 AM Nils Flack wrote: Reason for CRM: Howie Ill called - pt cancelled her appt for 11/24/17 with Dr Darrel Hoover.  Pt states she is no longer having pain.

## 2017-11-19 DIAGNOSIS — A09 Infectious gastroenteritis and colitis, unspecified: Secondary | ICD-10-CM | POA: Diagnosis not present

## 2017-11-19 DIAGNOSIS — Z85038 Personal history of other malignant neoplasm of large intestine: Secondary | ICD-10-CM | POA: Diagnosis not present

## 2017-12-03 ENCOUNTER — Ambulatory Visit (INDEPENDENT_AMBULATORY_CARE_PROVIDER_SITE_OTHER): Payer: Medicare Other

## 2017-12-03 DIAGNOSIS — E538 Deficiency of other specified B group vitamins: Secondary | ICD-10-CM

## 2017-12-03 MED ORDER — CYANOCOBALAMIN 1000 MCG/ML IJ SOLN
1000.0000 ug | Freq: Once | INTRAMUSCULAR | Status: AC
  Administered 2017-12-03: 1000 ug via INTRAMUSCULAR

## 2017-12-03 NOTE — Progress Notes (Signed)
71ml given in Right Deltoid. Pt tolerated well.

## 2017-12-06 DIAGNOSIS — N390 Urinary tract infection, site not specified: Secondary | ICD-10-CM | POA: Diagnosis not present

## 2017-12-09 ENCOUNTER — Telehealth: Payer: Self-pay | Admitting: Family Medicine

## 2017-12-09 NOTE — Telephone Encounter (Signed)
Is she having any residual symptoms? If so, please have her come in for UA and bring any records from [facility] she has.  If symptoms fully resolved, likely ok to monitor without retesting urine.

## 2017-12-09 NOTE — Telephone Encounter (Signed)
Attempted several times to contact pt. Busy signal.  Will try again tomorrow.

## 2017-12-09 NOTE — Telephone Encounter (Signed)
Copied from [place] [id number]. Topic: Quick Communication - See Telephone Encounter >> Dec 09, 2017  9:16 AM Ivar Drape wrote: CRM for notification. See Telephone encounter for: 12/09/17. Patient went to a Urgent Care on Sunday, 12/06/17 and was treated for a UTI.  She has taken all of the medication (sulfameth/trimethoprim) they gave her which lasted for three days but she wants to know if the practice will do another check to make sure it has cleared up.

## 2017-12-10 NOTE — Telephone Encounter (Signed)
Spoke with pt asking if still having sxs.  States she is not so I relayed Dr. Synthia Innocent message. Pt verbalizes understanding.

## 2017-12-25 ENCOUNTER — Other Ambulatory Visit: Payer: Self-pay | Admitting: Internal Medicine

## 2018-01-07 ENCOUNTER — Ambulatory Visit (INDEPENDENT_AMBULATORY_CARE_PROVIDER_SITE_OTHER): Payer: Medicare Other | Admitting: *Deleted

## 2018-01-07 DIAGNOSIS — E538 Deficiency of other specified B group vitamins: Secondary | ICD-10-CM

## 2018-01-07 MED ORDER — CYANOCOBALAMIN 1000 MCG/ML IJ SOLN
1000.0000 ug | Freq: Once | INTRAMUSCULAR | Status: AC
  Administered 2018-01-07: 1000 ug via INTRAMUSCULAR

## 2018-01-07 NOTE — Progress Notes (Signed)
Per orders of Dr. Gutierrez, injection of b12 given by Jessica Seidman H. Patient tolerated injection well.  

## 2018-01-08 ENCOUNTER — Telehealth: Payer: Self-pay | Admitting: Cardiology

## 2018-01-08 NOTE — Telephone Encounter (Signed)
Spoke with the patient, she advised that her pharmacy has not been able to get irbesartan yet, she had been out for 1 week. The patient said she has felt fine and her blood pressures were around 120s and even some lower ones at 90/40. Advised her to monitor blood pressure values over the weekend and take them consistently in the morning and I will call her on Monday for the results to determine if irbesartan is still necessary. She denies any symptoms of hypertension and feels better than normal.

## 2018-01-08 NOTE — Telephone Encounter (Signed)
  Pt c/o medication issue:   1. Name of Medication: irbesartan (AVAPRO) 300 MG tablet  2. How are you currently taking this medication (dosage and times per day)? Take 1 tablet (300 mg total) by mouth daily  3. Are you having a reaction (difficulty breathing--STAT)?  No  4. What is your medication issue? Pharmacy says medication is still on back order and patient has been completely out for several days now. Patient would like to know if there is a medication they can substitute?

## 2018-01-11 NOTE — Telephone Encounter (Signed)
BPs remain good off irbesartan so would keep off of this for now.  Have patient contact us if her blood pressure goes above 150/90.

## 2018-01-11 NOTE — Telephone Encounter (Signed)
Spoke with the patient, she was feeling good and her blood pressure were,  Saturday: 130/51 Sunday: 114/45 Monday: 116/45

## 2018-01-11 NOTE — Telephone Encounter (Signed)
Spoke with the patient about Dr. Radford Pax recommendations, she expressed understanding and had no further questions.

## 2018-01-22 HISTORY — PX: COLONOSCOPY: SHX174

## 2018-01-25 DIAGNOSIS — Z85038 Personal history of other malignant neoplasm of large intestine: Secondary | ICD-10-CM | POA: Diagnosis not present

## 2018-02-05 ENCOUNTER — Encounter: Payer: Self-pay | Admitting: Family Medicine

## 2018-02-10 ENCOUNTER — Ambulatory Visit (INDEPENDENT_AMBULATORY_CARE_PROVIDER_SITE_OTHER): Payer: Medicare Other

## 2018-02-10 DIAGNOSIS — E538 Deficiency of other specified B group vitamins: Secondary | ICD-10-CM

## 2018-02-10 MED ORDER — CYANOCOBALAMIN 1000 MCG/ML IJ SOLN
1000.0000 ug | Freq: Once | INTRAMUSCULAR | Status: AC
  Administered 2018-02-10: 1000 ug via INTRAMUSCULAR

## 2018-02-10 NOTE — Progress Notes (Signed)
Per orders of Dr. Danise Mina, injection of Vitamin B12 given by Diamond Nickel. Patient tolerated injection well.

## 2018-04-01 ENCOUNTER — Ambulatory Visit (INDEPENDENT_AMBULATORY_CARE_PROVIDER_SITE_OTHER): Payer: Medicare Other | Admitting: *Deleted

## 2018-04-01 DIAGNOSIS — E538 Deficiency of other specified B group vitamins: Secondary | ICD-10-CM | POA: Diagnosis not present

## 2018-04-01 MED ORDER — CYANOCOBALAMIN 1000 MCG/ML IJ SOLN
1000.0000 ug | Freq: Once | INTRAMUSCULAR | Status: AC
  Administered 2018-04-01: 1000 ug via INTRAMUSCULAR

## 2018-04-01 NOTE — Progress Notes (Signed)
Per orders of Dr. Danise Mina, injection of b12 given by Modena Nunnery. Patient tolerated injection well.

## 2018-04-12 DIAGNOSIS — Z1231 Encounter for screening mammogram for malignant neoplasm of breast: Secondary | ICD-10-CM | POA: Diagnosis not present

## 2018-04-12 LAB — HM MAMMOGRAPHY

## 2018-04-14 ENCOUNTER — Other Ambulatory Visit: Payer: Self-pay | Admitting: Family Medicine

## 2018-04-14 NOTE — Telephone Encounter (Signed)
Electronic refill request Vitamin D3 50000 Last office visit 11/04/17 Last refill 11/04/17 #12/1 Please confirm refill

## 2018-04-15 ENCOUNTER — Encounter: Payer: Self-pay | Admitting: Family Medicine

## 2018-05-03 ENCOUNTER — Other Ambulatory Visit: Payer: Self-pay | Admitting: Podiatry

## 2018-05-03 ENCOUNTER — Ambulatory Visit (INDEPENDENT_AMBULATORY_CARE_PROVIDER_SITE_OTHER): Payer: Medicare Other | Admitting: Podiatry

## 2018-05-03 ENCOUNTER — Encounter: Payer: Self-pay | Admitting: Podiatry

## 2018-05-03 ENCOUNTER — Ambulatory Visit (INDEPENDENT_AMBULATORY_CARE_PROVIDER_SITE_OTHER): Payer: Medicare Other

## 2018-05-03 DIAGNOSIS — M7752 Other enthesopathy of left foot: Secondary | ICD-10-CM | POA: Diagnosis not present

## 2018-05-03 DIAGNOSIS — M2042 Other hammer toe(s) (acquired), left foot: Secondary | ICD-10-CM | POA: Diagnosis not present

## 2018-05-03 DIAGNOSIS — M2041 Other hammer toe(s) (acquired), right foot: Secondary | ICD-10-CM | POA: Diagnosis not present

## 2018-05-03 DIAGNOSIS — M779 Enthesopathy, unspecified: Secondary | ICD-10-CM

## 2018-05-03 MED ORDER — TRIAMCINOLONE ACETONIDE 10 MG/ML IJ SUSP
10.0000 mg | Freq: Once | INTRAMUSCULAR | Status: AC
  Administered 2018-05-03: 10 mg

## 2018-05-03 NOTE — Progress Notes (Signed)
Subjective:   Patient ID: Priscilla [name], female   DOB: 85 y.o.   MRN: [id number]   HPI Patient presents stating the second toe has started to become very painful and she is concerned about hammertoe deformity and possible surgery.  States it is gotten worse over the last couple months   ROS      Objective:  Physical Exam  Neurovascular status intact negative Homans sign was noted with patient found to have rigid contracture of the lesser digits left foot with keratotic lesion dorsal aspect digit to left and inflammation pain of the second metatarsal phalangeal joint left foot.  Patient is found to have good digital perfusion well oriented x3     Assessment:  Inflammatory capsulitis the MPJ left possible with also possible flexor plate issue and hammertoe deformity with rigid contracture digits 2 3 left     Plan:  H&P conditions reviewed and discussed possible digital straightening in future but at this point I did do a proximal nerve block of the left forefoot sterile prep applied and aspirated the second MPJ getting out a small amount of a pinkish fluid and injected quarter cc dexamethasone Kenalog and advised on reduced activity and to be seen back again in 1 month and also debrided lesion discussed hammertoe with the possibility for digital fusion in future  X-ray indicates significant digital contracture left over right with rigid contracture digits 2 3 left and quite a bit of elevation second digit left

## 2018-05-06 ENCOUNTER — Ambulatory Visit (INDEPENDENT_AMBULATORY_CARE_PROVIDER_SITE_OTHER): Payer: Medicare Other | Admitting: *Deleted

## 2018-05-06 DIAGNOSIS — E538 Deficiency of other specified B group vitamins: Secondary | ICD-10-CM | POA: Diagnosis not present

## 2018-05-06 MED ORDER — CYANOCOBALAMIN 1000 MCG/ML IJ SOLN
1000.0000 ug | Freq: Once | INTRAMUSCULAR | Status: AC
Start: 2018-05-06 — End: 2018-05-06
  Administered 2018-05-06: 1000 ug via INTRAMUSCULAR

## 2018-05-06 NOTE — Progress Notes (Signed)
Per orders of Dr.Gutierrez, injection of Vitamin B12 given by Devantae Babe Simpson.  Patient tolerated injection well. 

## 2018-05-20 NOTE — Progress Notes (Signed)
Reviewed

## 2018-05-24 ENCOUNTER — Ambulatory Visit (INDEPENDENT_AMBULATORY_CARE_PROVIDER_SITE_OTHER): Payer: Medicare Other | Admitting: Podiatry

## 2018-05-24 ENCOUNTER — Encounter: Payer: Self-pay | Admitting: Podiatry

## 2018-05-24 DIAGNOSIS — M779 Enthesopathy, unspecified: Secondary | ICD-10-CM

## 2018-05-24 DIAGNOSIS — M2042 Other hammer toe(s) (acquired), left foot: Secondary | ICD-10-CM | POA: Diagnosis not present

## 2018-05-24 DIAGNOSIS — M2041 Other hammer toe(s) (acquired), right foot: Secondary | ICD-10-CM | POA: Diagnosis not present

## 2018-05-26 NOTE — Progress Notes (Signed)
   Subjective:   Patient ID: Priscilla [name], female   DOB: 85 y.o.   MRN: [id number]   HPI Patient states is feeling quite a bit better with pain still noted upon deep palpation but overall improving   ROS      Objective:  Physical Exam  Neurovascular status intact with discomfort still noted within the capsule of the lesser MPJ but it is definitely improved over where it was previously     Assessment:  Inflammatory capsulitis with digital deformities which is improving     Plan:  H&P discussed condition and explained her and her family shoe gear modifications physical therapy reduced activity and the consideration for long-term digital correction which I educated them on today.  Patient will be seen back depending on symptoms

## 2018-06-05 IMAGING — NM NM MISC PROCEDURE
6 series · 36 of 36 positions shown · non-contrast
Comparison: none

[Series 1: wbr_r-proj_st rest · 6.40mm/px · 6 of 64 frames shown]
[frame 6/64]
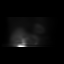
[frame 16/64]
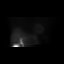
[frame 27/64]
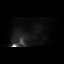
[frame 38/64]
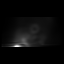
[frame 48/64]
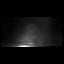
[frame 59/64]
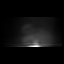

[Series 1: stress-gsp · 6.40mm/px · 6 of 512 frames shown]
[frame 43/512]
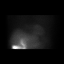
[frame 128/512]
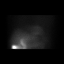
[frame 214/512]
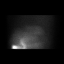
[frame 299/512]
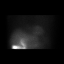
[frame 384/512]
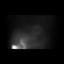
[frame 470/512]
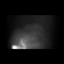

[Series 1: wbr_s-proj_st stress-sum-em · 6.40mm/px · 6 of 64 frames shown]
[frame 6/64]
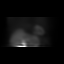
[frame 16/64]
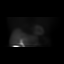
[frame 27/64]
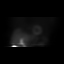
[frame 38/64]
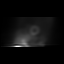
[frame 48/64]
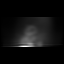
[frame 59/64]
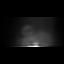

[Series 1: stress-sum-em · 6.40mm/px · 6 of 64 frames shown]
[frame 6/64]
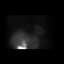
[frame 16/64]
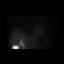
[frame 27/64]
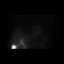
[frame 38/64]
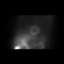
[frame 48/64]
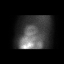
[frame 59/64]
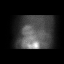

[Series 1: wbr_s-proj_st stress-gsp · 6.40mm/px · 6 of 512 frames shown]
[frame 43/512]
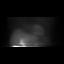
[frame 128/512]
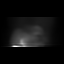
[frame 214/512]
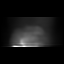
[frame 299/512]
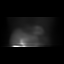
[frame 384/512]
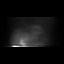
[frame 470/512]
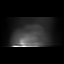

[Series 1: rest · 6.40mm/px · 6 of 64 frames shown]
[frame 6/64]
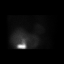
[frame 16/64]
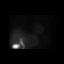
[frame 27/64]
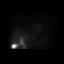
[frame 38/64]
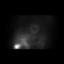
[frame 48/64]
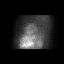
[frame 59/64]
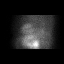

[36 of 36 positions shown; findings below may reference images not displayed]

Canned report from images found in remote index.

Refer to host system for actual result text.

## 2018-06-08 ENCOUNTER — Ambulatory Visit (INDEPENDENT_AMBULATORY_CARE_PROVIDER_SITE_OTHER): Payer: Medicare Other

## 2018-06-08 ENCOUNTER — Other Ambulatory Visit: Payer: Self-pay

## 2018-06-08 ENCOUNTER — Other Ambulatory Visit (INDEPENDENT_AMBULATORY_CARE_PROVIDER_SITE_OTHER): Payer: Medicare Other

## 2018-06-08 DIAGNOSIS — E538 Deficiency of other specified B group vitamins: Secondary | ICD-10-CM

## 2018-06-08 LAB — VITAMIN B12: VITAMIN B 12: 397 pg/mL (ref 211–911)

## 2018-06-08 MED ORDER — CYANOCOBALAMIN 1000 MCG/ML IJ SOLN
1000.0000 ug | Freq: Once | INTRAMUSCULAR | Status: AC
  Administered 2018-06-08: 1000 ug via INTRAMUSCULAR

## 2018-06-08 NOTE — Progress Notes (Signed)
Per orders of Dr. Danise Mina, injection of B12 given by Kris Mouton. Patient tolerated injection well.  B12 level checked today prior to injection

## 2018-06-09 ENCOUNTER — Ambulatory Visit (INDEPENDENT_AMBULATORY_CARE_PROVIDER_SITE_OTHER): Payer: Medicare Other | Admitting: Primary Care

## 2018-06-09 ENCOUNTER — Encounter: Payer: Self-pay | Admitting: Primary Care

## 2018-06-09 VITALS — BP 136/80 | HR 62 | Temp 98.0°F | Ht 61.0 in | Wt 149.0 lb

## 2018-06-09 DIAGNOSIS — R8290 Unspecified abnormal findings in urine: Secondary | ICD-10-CM | POA: Diagnosis not present

## 2018-06-09 DIAGNOSIS — N898 Other specified noninflammatory disorders of vagina: Secondary | ICD-10-CM | POA: Diagnosis not present

## 2018-06-09 LAB — POC URINALSYSI DIPSTICK (AUTOMATED)
Bilirubin, UA: NEGATIVE
Blood, UA: NEGATIVE
Glucose, UA: NEGATIVE
Ketones, UA: NEGATIVE
NITRITE UA: NEGATIVE
PH UA: 7 (ref 5.0–8.0)
PROTEIN UA: NEGATIVE
SPEC GRAV UA: 1.015 (ref 1.010–1.025)
UROBILINOGEN UA: 0.2 U/dL

## 2018-06-09 NOTE — Progress Notes (Signed)
Subjective:    Patient ID: Priscilla [name], female    DOB: 09-22-1930, 85 y.o.   MRN: [id number]  HPI  [name] is an 85 year old female who presents today with a chief complaint of vaginal discomfort.  She also reports an external vaginal swelling to the left side which is painful, red. She first noticed this several months ago, has progressed in size and pain since. She has also noticed some foul smelling odor to her urine which has been present for the last several months. History of UTI with last UTI being in September 2019.  She denies changes in soaps, detergents, medications, food. She drinks several glasses of water daily, also tea and coffee. She denies vaginal bleeding/discharge, hematuria, flank pain, urinary frequency.  Review of Systems  Genitourinary: Negative for dysuria, flank pain, frequency, hematuria, vaginal bleeding and vaginal discharge.       Vaginal growth, vaginal itching       Past Medical History:  Diagnosis Date  . Aortic insufficiency    mild to moderate on echo 2017  . B12 deficiency 03/25/2014   H/o small bowel obstruction with resection as well as colorectal cancer s/p partial colectomy.  Receives B12 injections   . Cancer ([place]) 2001, 2008   colon, rectal  . Chest pain, atypical    no ischemia on nuclear stress test 2017  . History of chicken pox   . History of Clostridium difficile 2012  . History of DVT (deep vein thrombosis) 2002   right leg, after chemo for first cancer, treated  . Hypertension   . IDA (iron deficiency anemia)   . Open wound of abdominal wall with complication   . Osteoarthritis   . Osteopenia 09/2011   DEXA 2013 spine -2.2, femur -1.6, DEXA 12/2015 - hip T score -1.9  . SBO (small bowel obstruction) ([place]) 02/05/2011   On 02/05/11 she had enterolysis and small bowel resection      Social History   Socioeconomic History  . Marital status: Widowed    Spouse name: Not on file  . Number of children: Not on file  .  Years of education: Not on file  . Highest education level: Not on file  Occupational History  . Not on file  Social Needs  . Financial resource strain: Not on file  . Food insecurity:    Worry: Not on file    Inability: Not on file  . Transportation needs:    Medical: Not on file    Non-medical: Not on file  Tobacco Use  . Smoking status: Never Smoker  . Smokeless tobacco: Never Used  Substance and Sexual Activity  . Alcohol use: No  . Drug use: No  . Sexual activity: Not Currently  Lifestyle  . Physical activity:    Days per week: Not on file    Minutes per session: Not on file  . Stress: Not on file  Relationships  . Social connections:    Talks on phone: Not on file    Gets together: Not on file    Attends religious service: Not on file    Active member of club or organization: Not on file    Attends meetings of clubs or organizations: Not on file    Relationship status: Not on file  . Intimate partner violence:    Fear of current or ex partner: Not on file    Emotionally abused: Not on file    Physically abused: Not on file  Forced sexual activity: Not on file  Other Topics Concern  . Not on file  Social History Narrative   Caffeine: decaf   Lives alone, daughter lives nearby to visit.   Occupation: retired, worked at [employer] office   Edu: HS   Activity: tries to get walking, walks up and down steps at home   Diet: good water, fruits/vegetables daily.  1 cup milk/day, cheese.      Cards: Dr. Mare Ferrari   Surgeon: Dr. Rush Farmer    Past Surgical History:  Procedure Laterality Date  . ABDOMINAL HYSTERECTOMY  1984  . APPENDECTOMY  1984  . BOWEL RESECTION  02/05/2011   Procedure: SMALL BOWEL RESECTION;  Surgeon: Harl Bowie, MD;  Location: [place];  Service: General;  Laterality: N/A;  . broken nose    . CARDIOVASCULAR STRESS TEST  11/2015   WNL ([place])  . COLECTOMY    . COLON SURGERY  2001 and 2008  . COLONOSCOPY  12/2012   erosions at colonic  anastomosis, o/w normal rpt 5 yrs (Dr. Penelope Coop at [place])  . COLONOSCOPY  01/2018   WNL no rpt recommended ([place])  . COLOSTOMY    . DEXA  09/2011   T-score: spine -2.2, femur -1.6  . ESOPHAGOGASTRODUODENOSCOPY  01/31/2011   Procedure: ESOPHAGOGASTRODUODENOSCOPY (EGD);  Surgeon: Missy Sabins, MD;  Location: [facility] END[facility]COPY;  Service: Endoscopy;  Laterality: N/A;  duodenal bx;s r/o giardia, r/o celiac diease  . EYE SURGERY  2018   cataract surgery  . EYE SURGERY  2019   laser surgery  . FLEXIBLE SIGMOIDOSCOPY  01/31/2011   Procedure: FLEXIBLE SIGMOIDOSCOPY;  Surgeon: Missy Sabins, MD;  Location: [place];  Service: Endoscopy;  Laterality: N/A;  . ILEOSTOMY    . JOINT REPLACEMENT  2010   knee   . LAPAROTOMY  02/05/2011   Procedure: EXPLORATORY LAPAROTOMY;  Surgeon: Harl Bowie, MD;  Location: [place];  Service: General;  Laterality: N/A;  lysis of adhesions.  . NUCLEAR STRESS TEST  11/17/08   NORMAL  . REPLACEMENT TOTAL KNEE  2010   RIGHT KNEE  . WOUND DEBRIDEMENT N/A 05/21/2012   Procedure: Excision non healing ABDOMINAL WOUND;  Surgeon: Harl Bowie, MD;  Location: [facility] OR;  Service: General;  Laterality: N/A;    Family History  Problem Relation Age of Onset  . Heart disease Mother        valve problems  . Heart attack Mother   . Congestive Heart Failure Mother   . Heart disease Father        due to lupus  . Lupus Father   . Heart attack Father   . Colon cancer Paternal Grandmother   . Coronary artery disease Brother        s/p CABG  . Coronary artery disease Sister        s/p CABG  . Heart attack Brother   . Heart attack Sister   . Diabetes Neg Hx   . Stroke Neg Hx   . Hypertension Neg Hx     Allergies  Allergen Reactions  . Morphine And Related Nausea And Vomiting  . Lisinopril Cough    Current Outpatient Medications on File Prior to Visit  Medication Sig Dispense Refill  . aspirin 81 MG chewable tablet Chew 81 mg by mouth daily.    Marland Kitchen atenolol (TENORMIN)  50 MG tablet Take 1 tablet (50 mg total) by mouth 2 (two) times daily. 180 tablet 3  . cholecalciferol (VITAMIN D) 1000 UNITS  tablet Take 2,000 Units by mouth daily.     . cyanocobalamin (,VITAMIN B-12,) 1000 MCG/ML injection Inject 1 mL (1,000 mcg total) into the muscle every 30 (thirty) days. Start 03/2014 1 mL 0  . D3-50 1.25 MG (50000 UT) capsule TAKE 1 CAPSULE BY MOUTH ONCE A WEEK 12 capsule 1  . famotidine (PEPCID) 20 MG tablet One at bedtime 30 tablet 11  . ferrous sulfate 325 (65 FE) MG tablet Take 325 mg by mouth daily with breakfast.    . hydrochlorothiazide (HYDRODIURIL) 12.5 MG tablet Take 1 tablet (12.5 mg total) by mouth 2 (two) times daily. 60 tablet 11  . irbesartan (AVAPRO) 300 MG tablet Take 1 tablet (300 mg total) by mouth daily. 90 tablet 3  . nitroGLYCERIN (NITROSTAT) 0.4 MG SL tablet Place 1 tablet (0.4 mg total) under the tongue every 5 (five) minutes as needed for chest pain. 25 tablet 1  . pantoprazole (PROTONIX) 40 MG tablet TAKE 1 TABLET BY MOUTH EVERY DAY 30-60 MINUTES BEFORE FIRST MEAL OF THE DAY (Patient not taking: Reported on 06/09/2018) 90 tablet 0  . [DISCONTINUED] CALCIUM-VITAMIN D PO Take 1 tablet by mouth 2 (two) times daily.     No current facility-administered medications on file prior to visit.     BP 136/80   Pulse 62   Temp 98 F (36.7 C) (Oral)   Ht 5\' 1"  (1.549 m)   Wt 149 lb (67.6 kg)   SpO2 98%   BMI 28.15 kg/m    Objective:   Physical Exam  Constitutional: She appears well-nourished.  Neck: Neck supple.  Cardiovascular: Normal rate and regular rhythm.  Respiratory: Effort normal and breath sounds normal.  Genitourinary:    There is no tenderness or lesion on the right labia. There is tenderness on the left labia. There is no lesion on the left labia. Cervix exhibits discharge.    Vaginal discharge and erythema present.  There is erythema in the vagina.    Genitourinary Comments: Vaginal wall irritation noted   Skin: Skin is warm and  dry.  Psychiatric: She has a normal mood and affect.           Assessment & Plan:  Vaginal Itching:  Also with discomfort and swelling. Exam today with obvious irritation to left labia minora, vaginal introitus, vaginal canal. No obvious abscess, masses, or lichen sclerosis. Wet prep collected and pending. UA today with 2+ leuks, otherwise negative. Culture sent. Await results.   Pleas Koch, NP

## 2018-06-09 NOTE — Patient Instructions (Signed)
We will be in touch once we receive your vaginal swab and urine specimen.   You can try some hydrocortisone cream twice daily for discomfort.   It was a pleasure meeting you!

## 2018-06-10 ENCOUNTER — Telehealth: Payer: Self-pay | Admitting: Primary Care

## 2018-06-10 NOTE — Telephone Encounter (Signed)
Patient returned Chan's call about her lab results.  Please call patient back.

## 2018-06-11 LAB — WET PREP BY MOLECULAR PROBE
Candida species: NOT DETECTED
GARDNERELLA VAGINALIS: NOT DETECTED
MICRO NUMBER: 332900
SPECIMEN QUALITY: ADEQUATE
Trichomonas vaginosis: NOT DETECTED

## 2018-06-11 LAB — URINE CULTURE
MICRO NUMBER:: 332901
SPECIMEN QUALITY:: ADEQUATE

## 2018-06-12 ENCOUNTER — Other Ambulatory Visit: Payer: Self-pay | Admitting: Primary Care

## 2018-06-12 DIAGNOSIS — N3000 Acute cystitis without hematuria: Secondary | ICD-10-CM

## 2018-06-12 MED ORDER — SULFAMETHOXAZOLE-TRIMETHOPRIM 800-160 MG PO TABS: 1.0000 | ORAL_TABLET | Freq: Two times a day (BID) | ORAL | 0 refills | Status: DC

## 2018-06-14 NOTE — Telephone Encounter (Signed)
Called and spoke with pt, pt states she received the medication but asked about her vaginal swabs and b-12 bloodwork, I read in the notes to her that her vaginal swab was negative for yeast and bacteria and her b-12 was low and to continue her monthly B12 injections.  Pt understood.  Ina,cma

## 2018-07-13 ENCOUNTER — Ambulatory Visit (INDEPENDENT_AMBULATORY_CARE_PROVIDER_SITE_OTHER): Payer: Medicare Other

## 2018-07-13 ENCOUNTER — Other Ambulatory Visit: Payer: Self-pay

## 2018-07-13 DIAGNOSIS — E538 Deficiency of other specified B group vitamins: Secondary | ICD-10-CM | POA: Diagnosis not present

## 2018-07-13 MED ORDER — CYANOCOBALAMIN 1000 MCG/ML IJ SOLN
1000.0000 ug | Freq: Once | INTRAMUSCULAR | Status: AC
  Administered 2018-07-13: 09:00:00 1000 ug via INTRAMUSCULAR

## 2018-07-13 NOTE — Progress Notes (Signed)
Per orders of Dr. Javier Gutierrez, injection of B12 given by Esabella Stockinger Y. Patient tolerated injection well. 

## 2018-08-17 ENCOUNTER — Other Ambulatory Visit: Payer: Self-pay

## 2018-08-17 ENCOUNTER — Telehealth (INDEPENDENT_AMBULATORY_CARE_PROVIDER_SITE_OTHER): Payer: Medicare Other | Admitting: Cardiology

## 2018-08-17 ENCOUNTER — Encounter: Payer: Self-pay | Admitting: Cardiology

## 2018-08-17 VITALS — BP 117/43 | HR 70 | Ht 61.0 in | Wt 150.0 lb

## 2018-08-17 DIAGNOSIS — I119 Hypertensive heart disease without heart failure: Secondary | ICD-10-CM

## 2018-08-17 DIAGNOSIS — I340 Nonrheumatic mitral (valve) insufficiency: Secondary | ICD-10-CM | POA: Diagnosis not present

## 2018-08-17 DIAGNOSIS — R0602 Shortness of breath: Secondary | ICD-10-CM | POA: Diagnosis not present

## 2018-08-17 DIAGNOSIS — Z7189 Other specified counseling: Secondary | ICD-10-CM | POA: Diagnosis not present

## 2018-08-17 DIAGNOSIS — I351 Nonrheumatic aortic (valve) insufficiency: Secondary | ICD-10-CM

## 2018-08-17 MED ORDER — ATENOLOL 50 MG PO TABS: 50.0000 mg | ORAL_TABLET | Freq: Two times a day (BID) | ORAL | 3 refills | Status: DC

## 2018-08-17 MED ORDER — HYDROCHLOROTHIAZIDE 12.5 MG PO TABS: 12.5000 mg | ORAL_TABLET | Freq: Every day | ORAL | 3 refills | Status: DC

## 2018-08-17 NOTE — Addendum Note (Signed)
Addended by: Sarina Ill on: 08/17/2018 11:52 AM   Modules accepted: Orders

## 2018-08-17 NOTE — Progress Notes (Signed)
Virtual Visit via telephone Note   This visit type was conducted due to national recommendations for restrictions regarding the COVID-19 Pandemic (e.g. social distancing) in an effort to limit this patient's exposure and mitigate transmission in our community.  Due to her co-morbid illnesses, this patient is at least at moderate risk for complications without adequate follow up.  This format is felt to be most appropriate for this patient at this time.  All issues noted in this document were discussed and addressed.  A limited physical exam was performed with this format.  Please refer to the patient's chart for her consent to telehealth for [facility].  Evaluation Performed:  Follow-up visit  This visit type was conducted due to national recommendations for restrictions regarding the COVID-19 Pandemic (e.g. social distancing).  This format is felt to be most appropriate for this patient at this time.  All issues noted in this document were discussed and addressed.  No physical exam was performed (except for noted visual exam findings with Video Visits).  Please refer to the patient's chart (MyChart message for video visits and phone note for telephone visits) for the patient's consent to telehealth for [facility].  Date:  08/17/2018   ID:  Priscilla [name], DOB [birth year], MRN [id number]  Patient Location:  Home  Provider location:   [place]  PCP:  Ria Bush, MD  Cardiologist:  Fransico Him, MD Electrophysiologist:  None   Chief Complaint:  HTN and aortic insufficiency  History of Present Illness:    Priscilla [name] is a 85 y.o. female who presents via audio/video conferencing for a telehealth visit today.    [name] is a 85 y.o. female with a hx of HTN, moderate aortic insufficiency with mild mitral regurgitation.  She is here today for followup and is doing well.  She has a lot of indigestion that resolves with TUMS.  She also has been having some mild chest  discomfort over her left breast that she describes as a mild dull pressure with no radiation.  She has not associated sx of diaphoresis, nausea or SOB.  It lasts less than 5 min.  It usually occurs in bed at night.  It never comes on with exertion.  It is rare in occurrence maybe once monthly. She does have DOE and now cannot get around as much due to increased SOB and exertional fatigue this has been going on for over a year.  She had a nuclear stress test a year ago for similar sx and it was low risk and showed no ischemia.  She denies any  PND, orthopnea, LE edema, dizziness, palpitations or syncope. She is compliant with her meds and is tolerating meds with no SE.    The patient does not have symptoms concerning for COVID-19 infection (fever, chills, cough, or new shortness of breath).    Prior CV studies:   The following studies were reviewed today:  2D echo , lexiscan myoview  Past Medical History:  Diagnosis Date  . Aortic insufficiency    mild to moderate on echo 2017  . B12 deficiency 03/25/2014   H/o small bowel obstruction with resection as well as colorectal cancer s/p partial colectomy.  Receives B12 injections   . Cancer ([place]) 2001, 2008   colon, rectal  . Chest pain, atypical    no ischemia on nuclear stress test 2017  . History of chicken pox   . History of Clostridium difficile 2012  . History of DVT (deep vein thrombosis) 2002  right leg, after chemo for first cancer, treated  . Hypertension   . IDA (iron deficiency anemia)   . Open wound of abdominal wall with complication   . Osteoarthritis   . Osteopenia 09/2011   DEXA 2013 spine -2.2, femur -1.6, DEXA 12/2015 - hip T score -1.9  . SBO (small bowel obstruction) ([place]) 02/05/2011   On 02/05/11 she had enterolysis and small bowel resection    Past Surgical History:  Procedure Laterality Date  . ABDOMINAL HYSTERECTOMY  1984  . APPENDECTOMY  1984  . BOWEL RESECTION  02/05/2011   Procedure: SMALL BOWEL RESECTION;   Surgeon: Harl Bowie, MD;  Location: [place];  Service: General;  Laterality: N/A;  . broken nose    . CARDIOVASCULAR STRESS TEST  11/2015   WNL ([place] [facility])  . COLECTOMY    . COLON SURGERY  2001 and 2008  . COLONOSCOPY  12/2012   erosions at colonic anastomosis, o/w normal rpt 5 yrs (Dr. Penelope Coop at [place])  . COLONOSCOPY  01/2018   WNL no rpt recommended ([place])  . COLOSTOMY    . DEXA  09/2011   T-score: spine -2.2, femur -1.6  . ESOPHAGOGASTRODUODENOSCOPY  01/31/2011   Procedure: ESOPHAGOGASTRODUODENOSCOPY (EGD);  Surgeon: Missy Sabins, MD;  [facility] END[facility]COPY;  Service: Endoscopy;  Laterality: N/A;  duodenal bx;s r/o giardia, r/o celiac diease  . EYE SURGERY  2018   cataract surgery  . EYE SURGERY  2019   laser surgery  . FLEXIBLE SIGMOIDOSCOPY  01/31/2011   Procedure: FLEXIBLE SIGMOIDOSCOPY;  Surgeon: Missy Sabins, MD;  Location: [place];  Service: Endoscopy;  Laterality: N/A;  . ILEOSTOMY    . JOINT REPLACEMENT  2010   knee   . LAPAROTOMY  02/05/2011   Procedure: EXPLORATORY LAPAROTOMY;  Surgeon: Harl Bowie, MD;  Location: [place];  Service: General;  Laterality: N/A;  lysis of adhesions.  . NUCLEAR STRESS TEST  11/17/08   NORMAL  . REPLACEMENT TOTAL KNEE  2010   RIGHT KNEE  . WOUND DEBRIDEMENT N/A 05/21/2012   Procedure: Excision non healing ABDOMINAL WOUND;  Surgeon: Harl Bowie, MD;  Location: [place];  Service: General;  Laterality: N/A;     No outpatient medications have been marked as taking for the 08/17/18 encounter (Telemedicine) with Sueanne Margarita, MD.     Allergies:   Morphine and related and Lisinopril   Social History   Tobacco Use  . Smoking status: Never Smoker  . Smokeless tobacco: Never Used  Substance Use Topics  . Alcohol use: No  . Drug use: No     Family Hx: The patient's family history includes Colon cancer in her paternal grandmother; Congestive Heart Failure in her mother; Coronary artery disease in her brother and sister;  Heart attack in her brother, father, mother, and sister; Heart disease in her father and mother; Lupus in her father. There is no history of Diabetes, Stroke, or Hypertension.  ROS:   Please see the history of present illness.    All other systems reviewed and are negative.   Labs/Other Tests and Data Reviewed:    Recent Labs: 09/30/2017: Pro B Natriuretic peptide (BNP) 237.0; TSH 2.95 10/28/2017: ALT 13; BUN 23; Creatinine, Ser 1.07; Hemoglobin 11.2; Platelets 326.0; Potassium 4.4; Sodium 134   Recent Lipid Panel Lab Results  Component Value Date/Time   CHOL 171 09/12/2014 08:38 AM   TRIG 137.0 09/12/2014 08:38 AM   HDL 70.70 09/12/2014 08:38 AM   CHOLHDL 2  09/12/2014 08:38 AM   LDLCALC 73 09/12/2014 08:38 AM   LDLDIRECT 101.7 12/12/2010 08:46 AM    Wt Readings from Last 3 Encounters:  08/17/18 150 lb (68 kg)  06/09/18 149 lb (67.6 kg)  11/04/17 152 lb 8 oz (69.2 kg)     Objective:    Vital Signs:  BP (!) 117/43   Pulse 70   Ht 5\' 1"  (1.549 m)   Wt 150 lb (68 kg)   BMI 28.34 kg/m     ASSESSMENT & PLAN:    1.  Hypertension - her BP is controlled on exam today.  She will continue on HCTZ 12.5 mg daily and atenolol 50 mg twice daily.  She is due for a bmet.  2.  Aortic insufficiency - this was trivial on last echo in 2018.  Given her increased SOB I will repeat echo to make sure this is stable.   3.  Mitral regurgitation - this was trivial as well on echo 2018  4.  GERD - she takes TUMS with relief of her sx. She rarely will have some chest achiness but only at night in bed and not with exertion.  It is rare in occurrence and I think it is related to GERD.  She has had these sx for a long while and a nuclear stress test a year ago was normal.    5.  SOB - she has to have shortness of breath over the past year.  Nuclear stress test a year ago for this showed no ischemia.  Repeat echo to make sure her AI and MR have not changed.    5.  COVID-19 Education:The signs and  symptoms of COVID-19 were discussed with the patient and how to seek care for testing (follow up with PCP or arrange E-visit).  The importance of social distancing was discussed today.  Patient Risk:   After full review of this patient's clinical status, I feel that they are at least moderate risk at this time.  Time:   Today, I have spent 20 minutes directly with the patient on telephone discussing medical problems including AI, MR and HTN.  We also reviewed the symptoms of COVID 19 and the ways to protect against contracting the virus with telehealth technology.  I spent an additional 5 minutes reviewing patient's chart including 2D echo, labs.  Medication Adjustments/Labs and Tests Ordered: Current medicines are reviewed at length with the patient today.  Concerns regarding medicines are outlined above.  Tests Ordered: No orders of the defined types were placed in this encounter.  Medication Changes: No orders of the defined types were placed in this encounter.   Disposition:  Follow up in 1 year(s)  Signed, Fransico Him, MD  08/17/2018 9:20 AM    [facility]

## 2018-08-17 NOTE — Patient Instructions (Signed)
Medication Instructions:  Your physician recommends that you continue on your current medications as directed. Please refer to the Current Medication list given to you today.  If you need a refill on your cardiac medications before your next appointment, please call your pharmacy.   Lab work: BMET, same day as your echo  If you have labs (blood work) drawn today and your tests are completely normal, you will receive your results only by: Marland Kitchen MyChart Message (if you have MyChart) OR . A paper copy in the mail If you have any lab test that is abnormal or we need to change your treatment, we will call you to review the results.  Testing/Procedures: Your physician has requested that you have an echocardiogram. Echocardiography is a painless test that uses sound waves to create images of your heart. It provides your doctor with information about the size and shape of your heart and how well your heart's chambers and valves are working. This procedure takes approximately one hour. There are no restrictions for this procedure.  Follow-Up: At [facility], you and your health needs are our priority.  As part of our continuing mission to provide you with exceptional heart care, we have created designated Provider Care Teams.  These Care Teams include your primary Cardiologist (physician) and Advanced Practice Providers (APPs -  Physician Assistants and Nurse Practitioners) who all work together to provide you with the care you need, when you need it. You will need a follow up appointment in 1 years.  Please call our office 2 months in advance to schedule this appointment.  You may see Dr. Radford Pax or one of the following Advanced Practice Providers on your designated Care Team:   [place], PA-C Melina Copa, PA-C . Ermalinda Barrios, PA-C

## 2018-08-18 ENCOUNTER — Other Ambulatory Visit: Payer: Self-pay

## 2018-08-18 ENCOUNTER — Ambulatory Visit (INDEPENDENT_AMBULATORY_CARE_PROVIDER_SITE_OTHER): Payer: Medicare Other

## 2018-08-18 ENCOUNTER — Ambulatory Visit: Payer: Medicare Other

## 2018-08-18 DIAGNOSIS — E538 Deficiency of other specified B group vitamins: Secondary | ICD-10-CM | POA: Diagnosis not present

## 2018-08-18 MED ORDER — CYANOCOBALAMIN 1000 MCG/ML IJ SOLN
1000.0000 ug | Freq: Once | INTRAMUSCULAR | Status: AC
  Administered 2018-08-18: 1000 ug via INTRAMUSCULAR

## 2018-08-18 NOTE — Progress Notes (Signed)
Per orders of Dr. Gutierrez, injection of vit B12 given by Clovia Reine. Patient tolerated injection well.  

## 2018-08-23 ENCOUNTER — Telehealth: Payer: Self-pay | Admitting: *Deleted

## 2018-08-23 NOTE — Telephone Encounter (Signed)
    COVID-19 Pre-Screening Questions:  . In the past 7 to 10 days have you had a cough,  shortness of breath, headache, congestion, fever (100 or greater) body aches, chills, sore throat, or sudden loss of taste or sense of smell? . Have you been around anyone with known Covid 19. . Have you been around anyone who is awaiting Covid 19 test results in the past 7 to 10 days? . Have you been around anyone who has been exposed to Covid 19, or has mentioned symptoms of Covid 19 within the past 7 to 10 days?  If you have any concerns/questions about symptoms patients report during screening (either on the phone or at threshold). Contact the provider seeing the patient or DOD for further guidance.  If neither are available contact a member of the leadership team.     Contacted patient vis telephone call. I got the voice mail. Awainting return call.

## 2018-08-24 ENCOUNTER — Telehealth (HOSPITAL_COMMUNITY): Payer: Self-pay | Admitting: Cardiology

## 2018-08-24 ENCOUNTER — Telehealth: Payer: Self-pay

## 2018-08-24 NOTE — Telephone Encounter (Signed)
    COVID-19 Pre-Screening Questions:  . In the past 7 to 10 days have you had a cough,  shortness of breath, headache, congestion, fever (100 or greater) body aches, chills, sore throat, or sudden loss of taste or sense of smell? . Have you been around anyone with known Covid 19. . Have you been around anyone who is awaiting Covid 19 test results in the past 7 to 10 days? . Have you been around anyone who has been exposed to Covid 19, or has mentioned symptoms of Covid 19 within the past 7 to 10 days?  If you have any concerns/questions about symptoms patients report during screening (either on the phone or at threshold). Contact the provider seeing the patient or DOD for further guidance.  If neither are available contact a member of the leadership team.          Pt answered NO to all pre-screening questions [id number] 08/24/2018

## 2018-08-24 NOTE — Telephone Encounter (Signed)
Left message

## 2018-08-25 ENCOUNTER — Ambulatory Visit (HOSPITAL_COMMUNITY): Payer: Medicare Other | Attending: Cardiology

## 2018-08-25 ENCOUNTER — Other Ambulatory Visit: Payer: Self-pay

## 2018-08-25 ENCOUNTER — Other Ambulatory Visit: Payer: Medicare Other | Admitting: *Deleted

## 2018-08-25 DIAGNOSIS — R0602 Shortness of breath: Secondary | ICD-10-CM

## 2018-08-25 LAB — BASIC METABOLIC PANEL
BUN/Creatinine Ratio: 20 (ref 12–28)
BUN: 20 mg/dL (ref 8–27)
CO2: 24 mmol/L (ref 20–29)
Calcium: 9.7 mg/dL (ref 8.7–10.3)
Chloride: 99 mmol/L (ref 96–106)
Creatinine, Ser: 1.02 mg/dL — ABNORMAL HIGH (ref 0.57–1.00)
GFR calc Af Amer: 58 mL/min/{1.73_m2} — ABNORMAL LOW (ref >59)
GFR calc non Af Amer: 50 mL/min/{1.73_m2} — ABNORMAL LOW (ref >59)
Glucose: 93 mg/dL (ref 65–99)
Potassium: 4.7 mmol/L (ref 3.5–5.2)
Sodium: 139 mmol/L (ref 134–144)

## 2018-09-02 ENCOUNTER — Telehealth: Payer: Self-pay | Admitting: Cardiology

## 2018-09-02 NOTE — Telephone Encounter (Signed)
Spoke with the patient, she was taking HCTZ 12.5 mg, 2 tablets before her visit in May. She also stated that her calcium supplement was removed and wanted to know if this was because she is not supposed to take it. Her blood pressures on the lower dose of HCTZ have been 129/47, 140/48 for the past two days. She states that she has occasional swelling in her legs and feet.

## 2018-09-02 NOTE — Telephone Encounter (Signed)
Patient returned call for her echo results.  

## 2018-09-02 NOTE — Telephone Encounter (Signed)
SHe needs to check with her PCP - I did not tell her to stop her calcium. BPs look good on current dose of diuretic.  Recommend following a low sodium (2gm) to reduce LE edema

## 2018-09-08 NOTE — Telephone Encounter (Signed)
Spoke with the patient, she expressed understanding and will focus on reduction of salt and will contact the office if her edema worsens.

## 2018-09-21 ENCOUNTER — Ambulatory Visit (INDEPENDENT_AMBULATORY_CARE_PROVIDER_SITE_OTHER): Payer: Medicare Other

## 2018-09-21 DIAGNOSIS — E538 Deficiency of other specified B group vitamins: Secondary | ICD-10-CM

## 2018-09-21 MED ORDER — CYANOCOBALAMIN 1000 MCG/ML IJ SOLN
1000.0000 ug | Freq: Once | INTRAMUSCULAR | Status: AC
  Administered 2018-09-21: 1000 ug via INTRAMUSCULAR

## 2018-09-21 NOTE — Progress Notes (Signed)
Per orders of Dr. Danise Mina, injection of vit B12 given by Lindalou Hose. Patient tolerated injection well.

## 2018-09-24 ENCOUNTER — Other Ambulatory Visit: Payer: Self-pay | Admitting: Family Medicine

## 2018-10-26 ENCOUNTER — Ambulatory Visit (INDEPENDENT_AMBULATORY_CARE_PROVIDER_SITE_OTHER): Payer: Medicare Other

## 2018-10-26 ENCOUNTER — Other Ambulatory Visit: Payer: Self-pay

## 2018-10-26 ENCOUNTER — Telehealth: Payer: Self-pay

## 2018-10-26 DIAGNOSIS — E538 Deficiency of other specified B group vitamins: Secondary | ICD-10-CM | POA: Diagnosis not present

## 2018-10-26 MED ORDER — CYANOCOBALAMIN 1000 MCG/ML IJ SOLN
1000.0000 ug | Freq: Once | INTRAMUSCULAR | Status: AC
  Administered 2018-10-26: 1000 ug via INTRAMUSCULAR

## 2018-10-26 NOTE — Progress Notes (Signed)
Per orders of Dr. Gutierrez, injection of B12 monthly given by Recardo Linn V Aadit Hagood. Patient tolerated injection well.  

## 2018-10-26 NOTE — Telephone Encounter (Signed)
error 

## 2018-11-03 ENCOUNTER — Other Ambulatory Visit: Payer: Self-pay | Admitting: Family Medicine

## 2018-11-03 DIAGNOSIS — E538 Deficiency of other specified B group vitamins: Secondary | ICD-10-CM

## 2018-11-03 DIAGNOSIS — I10 Essential (primary) hypertension: Secondary | ICD-10-CM

## 2018-11-03 DIAGNOSIS — D539 Nutritional anemia, unspecified: Secondary | ICD-10-CM

## 2018-11-03 DIAGNOSIS — E559 Vitamin D deficiency, unspecified: Secondary | ICD-10-CM

## 2018-11-04 ENCOUNTER — Ambulatory Visit: Payer: Medicare Other

## 2018-11-04 ENCOUNTER — Other Ambulatory Visit (INDEPENDENT_AMBULATORY_CARE_PROVIDER_SITE_OTHER): Payer: Medicare Other

## 2018-11-04 ENCOUNTER — Other Ambulatory Visit: Payer: Self-pay

## 2018-11-04 DIAGNOSIS — D539 Nutritional anemia, unspecified: Secondary | ICD-10-CM

## 2018-11-04 DIAGNOSIS — I10 Essential (primary) hypertension: Secondary | ICD-10-CM | POA: Diagnosis not present

## 2018-11-04 DIAGNOSIS — E559 Vitamin D deficiency, unspecified: Secondary | ICD-10-CM

## 2018-11-04 DIAGNOSIS — E538 Deficiency of other specified B group vitamins: Secondary | ICD-10-CM | POA: Diagnosis not present

## 2018-11-04 LAB — CBC WITH DIFFERENTIAL/PLATELET
Basophils Absolute: 0.1 10*3/uL (ref 0.0–0.1)
Basophils Relative: 2.5 % (ref 0.0–3.0)
Eosinophils Absolute: 0.1 10*3/uL (ref 0.0–0.7)
Eosinophils Relative: 3.1 % (ref 0.0–5.0)
HCT: 35.2 % — ABNORMAL LOW (ref 36.0–46.0)
Hemoglobin: 11.8 g/dL — ABNORMAL LOW (ref 12.0–15.0)
Lymphocytes Relative: 26.8 % (ref 12.0–46.0)
Lymphs Abs: 1.2 10*3/uL (ref 0.7–4.0)
MCHC: 33.6 g/dL (ref 30.0–36.0)
MCV: 93.2 fl (ref 78.0–100.0)
Monocytes Absolute: 0.6 10*3/uL (ref 0.1–1.0)
Monocytes Relative: 12.6 % — ABNORMAL HIGH (ref 3.0–12.0)
Neutro Abs: 2.4 10*3/uL (ref 1.4–7.7)
Neutrophils Relative %: 55 % (ref 43.0–77.0)
Platelets: 328 10*3/uL (ref 150.0–400.0)
RBC: 3.78 Mil/uL — ABNORMAL LOW (ref 3.87–5.11)
RDW: 12.4 % (ref 11.5–15.5)
WBC: 4.4 10*3/uL (ref 4.0–10.5)

## 2018-11-04 LAB — COMPREHENSIVE METABOLIC PANEL
ALT: 14 U/L (ref 0–35)
AST: 17 U/L (ref 0–37)
Albumin: 4.1 g/dL (ref 3.5–5.2)
Alkaline Phosphatase: 62 U/L (ref 39–117)
BUN: 16 mg/dL (ref 6–23)
CO2: 29 mEq/L (ref 19–32)
Calcium: 9.8 mg/dL (ref 8.4–10.5)
Chloride: 102 mEq/L (ref 96–112)
Creatinine, Ser: 0.9 mg/dL (ref 0.40–1.20)
GFR: 59.38 mL/min — ABNORMAL LOW (ref >60.00)
Glucose, Bld: 94 mg/dL (ref 70–99)
Potassium: 4.6 mEq/L (ref 3.5–5.1)
Sodium: 138 mEq/L (ref 135–145)
Total Bilirubin: 0.5 mg/dL (ref 0.2–1.2)
Total Protein: 6.3 g/dL (ref 6.0–8.3)

## 2018-11-04 LAB — LIPID PANEL
Cholesterol: 191 mg/dL (ref 0–200)
HDL: 84 mg/dL (ref >39.00)
LDL Cholesterol: 86 mg/dL (ref 0–99)
NonHDL: 106.57
Total CHOL/HDL Ratio: 2
Triglycerides: 102 mg/dL (ref 0.0–149.0)
VLDL: 20.4 mg/dL (ref 0.0–40.0)

## 2018-11-04 LAB — IBC PANEL
Iron: 173 ug/dL — ABNORMAL HIGH (ref 42–145)
Saturation Ratios: 45.3 % (ref 20.0–50.0)
Transferrin: 273 mg/dL (ref 212.0–360.0)

## 2018-11-04 LAB — VITAMIN D 25 HYDROXY (VIT D DEFICIENCY, FRACTURES): VITD: 68.94 ng/mL (ref 30.00–100.00)

## 2018-11-04 LAB — MICROALBUMIN / CREATININE URINE RATIO
Creatinine,U: 104.4 mg/dL
Microalb Creat Ratio: 2.7 mg/g (ref 0.0–30.0)
Microalb, Ur: 2.8 mg/dL — ABNORMAL HIGH (ref 0.0–1.9)

## 2018-11-04 LAB — FERRITIN: Ferritin: 38.1 ng/mL (ref 10.0–291.0)

## 2018-11-04 LAB — VITAMIN B12: Vitamin B-12: 628 pg/mL (ref 211–911)

## 2018-11-09 DIAGNOSIS — H0100A Unspecified blepharitis right eye, upper and lower eyelids: Secondary | ICD-10-CM | POA: Diagnosis not present

## 2018-11-09 DIAGNOSIS — H43813 Vitreous degeneration, bilateral: Secondary | ICD-10-CM | POA: Diagnosis not present

## 2018-11-09 DIAGNOSIS — H04123 Dry eye syndrome of bilateral lacrimal glands: Secondary | ICD-10-CM | POA: Diagnosis not present

## 2018-11-09 DIAGNOSIS — H524 Presbyopia: Secondary | ICD-10-CM | POA: Diagnosis not present

## 2018-11-11 ENCOUNTER — Encounter: Payer: Self-pay | Admitting: Family Medicine

## 2018-11-11 ENCOUNTER — Ambulatory Visit (INDEPENDENT_AMBULATORY_CARE_PROVIDER_SITE_OTHER): Payer: Medicare Other | Admitting: Family Medicine

## 2018-11-11 ENCOUNTER — Telehealth: Payer: Self-pay

## 2018-11-11 ENCOUNTER — Other Ambulatory Visit: Payer: Self-pay

## 2018-11-11 VITALS — BP 178/64 | HR 61 | Temp 98.2°F | Ht 59.5 in | Wt 151.6 lb

## 2018-11-11 DIAGNOSIS — I6523 Occlusion and stenosis of bilateral carotid arteries: Secondary | ICD-10-CM | POA: Insufficient documentation

## 2018-11-11 DIAGNOSIS — E559 Vitamin D deficiency, unspecified: Secondary | ICD-10-CM

## 2018-11-11 DIAGNOSIS — E538 Deficiency of other specified B group vitamins: Secondary | ICD-10-CM

## 2018-11-11 DIAGNOSIS — Z0000 Encounter for general adult medical examination without abnormal findings: Secondary | ICD-10-CM | POA: Diagnosis not present

## 2018-11-11 DIAGNOSIS — D539 Nutritional anemia, unspecified: Secondary | ICD-10-CM

## 2018-11-11 DIAGNOSIS — Z23 Encounter for immunization: Secondary | ICD-10-CM | POA: Diagnosis not present

## 2018-11-11 DIAGNOSIS — I10 Essential (primary) hypertension: Secondary | ICD-10-CM | POA: Diagnosis not present

## 2018-11-11 DIAGNOSIS — Z85038 Personal history of other malignant neoplasm of large intestine: Secondary | ICD-10-CM | POA: Diagnosis not present

## 2018-11-11 DIAGNOSIS — R0989 Other specified symptoms and signs involving the circulatory and respiratory systems: Secondary | ICD-10-CM | POA: Diagnosis not present

## 2018-11-11 DIAGNOSIS — Z85048 Personal history of other malignant neoplasm of rectum, rectosigmoid junction, and anus: Secondary | ICD-10-CM

## 2018-11-11 MED ORDER — NITROGLYCERIN 0.4 MG SL SUBL: 0.4000 mg | SUBLINGUAL_TABLET | SUBLINGUAL | 1 refills | Status: DC | PRN

## 2018-11-11 NOTE — Assessment & Plan Note (Addendum)
Check carotid US. 

## 2018-11-11 NOTE — Patient Instructions (Addendum)
Flu shot today If interested, check with pharmacy about new 2 shot shingles series (shingrix).  I will order carotid ultrasound to evaluate for plaque buildup in arteries of the neck.  Blood pressure was too high today - continue monitoring closely at home. Return in 1-2 months for BP recheck.   Sleep hygiene checklist: 1. Avoid naps during the day 2. Avoid stimulants such as caffeine and nicotine. Avoid bedtime alcohol (it can speed onset of sleep but the body's metabolism can cause awakenings). 3. All forms of exercise help ensure sound sleep - limit vigorous exercise to morning or late afternoon 4. Avoid food too close to bedtime including chocolate (which contains caffeine) 5. Soak up natural light 6. Establish regular bedtime routine. 7. Associate bed with sleep - avoid TV, computer or phone, reading while in bed. 8. Ensure pleasant, relaxing sleep environment - quiet, dark, cool room.   Health Maintenance After Age 76 After age 42, you are at a higher risk for certain long-term diseases and infections as well as injuries from falls. Falls are a major cause of broken bones and head injuries in people who are older than age 6. Getting regular preventive care can help to keep you healthy and well. Preventive care includes getting regular testing and making lifestyle changes as recommended by your health care provider. Talk with your health care provider about:  Which screenings and tests you should have. A screening is a test that checks for a disease when you have no symptoms.  A diet and exercise plan that is right for you. What should I know about screenings and tests to prevent falls? Screening and testing are the best ways to find a health problem early. Early diagnosis and treatment give you the best chance of managing medical conditions that are common after age 44. Certain conditions and lifestyle choices may make you more likely to have a fall. Your health care provider may  recommend:  Regular vision checks. Poor vision and conditions such as cataracts can make you more likely to have a fall. If you wear glasses, make sure to get your prescription updated if your vision changes.  Medicine review. Work with your health care provider to regularly review all of the medicines you are taking, including over-the-counter medicines. Ask your health care provider about any side effects that may make you more likely to have a fall. Tell your health care provider if any medicines that you take make you feel dizzy or sleepy.  Osteoporosis screening. Osteoporosis is a condition that causes the bones to get weaker. This can make the bones weak and cause them to break more easily.  Blood pressure screening. Blood pressure changes and medicines to control blood pressure can make you feel dizzy.  Strength and balance checks. Your health care provider may recommend certain tests to check your strength and balance while standing, walking, or changing positions.  Foot health exam. Foot pain and numbness, as well as not wearing proper footwear, can make you more likely to have a fall.  Depression screening. You may be more likely to have a fall if you have a fear of falling, feel emotionally low, or feel unable to do activities that you used to do.  Alcohol use screening. Using too much alcohol can affect your balance and may make you more likely to have a fall. What actions can I take to lower my risk of falls? General instructions  Talk with your health care provider about your risks for falling.  Tell your health care provider if: ? You fall. Be sure to tell your health care provider about all falls, even ones that seem minor. ? You feel dizzy, sleepy, or off-balance.  Take over-the-counter and prescription medicines only as told by your health care provider. These include any supplements.  Eat a healthy diet and maintain a healthy weight. A healthy diet includes low-fat dairy  products, low-fat (lean) meats, and fiber from whole grains, beans, and lots of fruits and vegetables. Home safety  Remove any tripping hazards, such as rugs, cords, and clutter.  Install safety equipment such as grab bars in bathrooms and safety rails on stairs.  Keep rooms and walkways well-lit. Activity   Follow a regular exercise program to stay fit. This will help you maintain your balance. Ask your health care provider what types of exercise are appropriate for you.  If you need a cane or walker, use it as recommended by your health care provider.  Wear supportive shoes that have nonskid soles. Lifestyle  Do not drink alcohol if your health care provider tells you not to drink.  If you drink alcohol, limit how much you have: ? 0-1 drink a day for women. ? 0-2 drinks a day for men.  Be aware of how much alcohol is in your drink. In the [place] one drink equals one typical bottle of beer (12 oz), one-half glass of wine (5 oz), or one shot of hard liquor (1 oz).  Do not use any products that contain nicotine or tobacco, such as cigarettes and e-cigarettes. If you need help quitting, ask your health care provider. Summary  Having a healthy lifestyle and getting preventive care can help to protect your health and wellness after age 79.  Screening and testing are the best way to find a health problem early and help you avoid having a fall. Early diagnosis and treatment give you the best chance for managing medical conditions that are more common for people who are older than age 23.  Falls are a major cause of broken bones and head injuries in people who are older than age 46. Take precautions to prevent a fall at home.  Work with your health care provider to learn what changes you can make to improve your health and wellness and to prevent falls. This information is not intended to replace advice given to you by your health care provider. Make sure you discuss any questions you  have with your health care provider. Document Released: 01/21/2017 Document Revised: 07/01/2018 Document Reviewed: 01/21/2017 Elsevier Patient Education  2020 [employer].

## 2018-11-11 NOTE — Assessment & Plan Note (Deleted)
Markedly elevated BP readings today - advised continue monitoring at home (states home readings normal) and RTC 1-2 mo repeat BP check. Patient asymptomatic today.

## 2018-11-11 NOTE — Assessment & Plan Note (Signed)
Continue vit D weekly replacement.

## 2018-11-11 NOTE — Assessment & Plan Note (Signed)
Continue daily oral iron and monthly b12 shots.

## 2018-11-11 NOTE — Assessment & Plan Note (Signed)
Markedly elevated BP readings today - advised continue monitoring at home (states home readings normal) and RTC 1-2 mo repeat BP check. Patient asymptomatic today.  

## 2018-11-11 NOTE — Progress Notes (Addendum)
This visit was conducted in person.  BP (!) 178/64 (BP Location: Right Arm, Patient Position: Sitting, Cuff Size: Normal)   Pulse 61   Temp 98.2 F (36.8 C) (Temporal)   Ht 4' 11.5" (1.511 m)   Wt 151 lb 9 oz (68.7 kg)   SpO2 97%   BMI 30.10 kg/m   Markedly elevated on repeat as well - anticipate due to calcified vessels as pt asymptomatic.  CC: AMW Subjective:    Patient ID: Priscilla [name], female    DOB: 1930/07/10, 85 y.o.   MRN: [id number]  HPI: Priscilla [name] is a 85 y.o. female presenting on 11/11/2018 for Medicare Wellness   Did not see health advisor this year.   Hearing Screening   125Hz  250Hz  500Hz  1000Hz  2000Hz  3000Hz  4000Hz  6000Hz  8000Hz   Right ear:   20 40 25  40    Left ear:   20 40 40  40    Vision Screening Comments: Last eye exam, 2 days ago    Office Visit from 11/11/2018 in [place] at [facility] Total Score  0      Fall Risk  11/11/2018 10/28/2017 09/29/2016 09/21/2015 09/19/2014  Falls in the past year? 1 Yes Yes Yes No  Comment - foot caught on vine in garden causing patient to fall - - -  Number falls in past yr: 1 1 1 2  or more -  Injury with Fall? 1 No - No -  02/2018 - fell going down steps and hurt R shoulder and hit mouth. No dizziness.   BP elevated today - states home readings much better, sometimes low.  Noticing trouble with insomnia - melatonin didn't help.   H/o colon cancer. Has had several abdominal surgeries (hysterectomy /appendectomy 1984, colon surgery for cancer 2001, 2008 (with ileostomy), bowel resection 2012, and prolonged non healing wound s/p debridement 2014).  She has h/o intestinal obstruction from adhesions, needed prolonged hospitalization and surgery 2012.  Preventative: H/o colon and rectal cancer. H/o bowel resection after SBO.  COLONOSCOPY Date: 12/2012 erosions at colonic anastomosis, o/w normal rpt 5 yrs (Dr. Penelope Coop at [place]). COLONOSCOPY 01/2018 - WNL no rpt recommended Penelope Coop)  Well woman - has  aged out of pap smears.  Mammogram - 03/2018 WNL.  DEXA 12/2015 - hip T score -1.9 Flu - yearly Td - 08/2011  peumovax 2009. prevnar 08/2013 zostavax 04/2014 shingrix - discussed. Advanced directives - copy in chart 08/2015. HCPOA would be son Clare Gandy) and daughter Lelan Pons), also would be ok with other children helping. Ok with CPR, intubation and limited trial for reversible conditions, does not want prolonged life support if terminal condition.  Seat belt use discussed Sunscreen use discussed. No changing moles on skin.  Non smoker Alcohol - none Dentist - yearly Eye exam - yearly Bowel - no constipation, chronic diarrhea x3-4 after GI surgeries Bladder - no incontinence  Caffeine: decaf  Lives alone, daughter lives nearby to visit. Widow for 16 yrs  Occupation: retired, worked at [employer] office  Edu: HS  Activity: yardwork, walks up and down steps at home  Diet: good water, fruits/vegetables daily. 1 cup milk/day, cheese     Relevant past medical, surgical, family and social history reviewed and updated as indicated. Interim medical history since our last visit reviewed. Allergies and medications reviewed and updated. Outpatient Medications Prior to Visit  Medication Sig Dispense Refill  . aspirin 81 MG chewable tablet Chew 81 mg by mouth daily.    Marland Kitchen atenolol (TENORMIN)  50 MG tablet Take 1 tablet (50 mg total) by mouth 2 (two) times daily. 180 tablet 3  . Biotin (BIOTIN 5000) 5 MG CAPS Take by mouth daily.    . Cholecalciferol (VITAMIN D3) 1.25 MG (50000 UT) CAPS TAKE 1 CAPSULE BY MOUTH ONE TIME PER WEEK 12 capsule 1  . cyanocobalamin (,VITAMIN B-12,) 1000 MCG/ML injection Inject 1 mL (1,000 mcg total) into the muscle every 30 (thirty) days. Start 03/2014 1 mL 0  . ferrous sulfate 325 (65 FE) MG tablet Take 325 mg by mouth daily with breakfast.    . hydrochlorothiazide (HYDRODIURIL) 12.5 MG tablet Take 1 tablet (12.5 mg total) by mouth daily. 90 tablet 3  . nitroGLYCERIN  (NITROSTAT) 0.4 MG SL tablet Place 1 tablet (0.4 mg total) under the tongue every 5 (five) minutes as needed for chest pain. 25 tablet 1   No facility-administered medications prior to visit.      Per HPI unless specifically indicated in ROS section below Review of Systems Objective:    BP (!) 178/64 (BP Location: Right Arm, Patient Position: Sitting, Cuff Size: Normal)   Pulse 61   Temp 98.2 F (36.8 C) (Temporal)   Ht 4' 11.5" (1.511 m)   Wt 151 lb 9 oz (68.7 kg)   SpO2 97%   BMI 30.10 kg/m   Wt Readings from Last 3 Encounters:  12/23/18 152 lb 1 oz (69 kg)  11/11/18 151 lb 9 oz (68.7 kg)  08/17/18 150 lb (68 kg)    Physical Exam Vitals signs and nursing note reviewed.  Constitutional:      General: She is not in acute distress.    Appearance: Normal appearance. She is well-developed. She is not ill-appearing.  HENT:     Head: Normocephalic and atraumatic.     Right Ear: Hearing, tympanic membrane, ear canal and external ear normal.     Left Ear: Hearing, tympanic membrane, ear canal and external ear normal.     Nose: Nose normal.     Mouth/Throat:     Mouth: Mucous membranes are moist.     Pharynx: Uvula midline. No oropharyngeal exudate or posterior oropharyngeal erythema.  Eyes:     General: No scleral icterus.    Extraocular Movements: Extraocular movements intact.     Conjunctiva/sclera: Conjunctivae normal.     Pupils: Pupils are equal, round, and reactive to light.  Neck:     Musculoskeletal: Normal range of motion and neck supple.     Thyroid: Thyromegaly (mild R thyroid fullness, asxs) present. No thyroid tenderness.     Vascular: Carotid bruit (L sided) present.  Cardiovascular:     Rate and Rhythm: Normal rate and regular rhythm.     Pulses: Normal pulses.          Radial pulses are 2+ on the right side and 2+ on the left side.     Heart sounds: Normal heart sounds. No murmur.  Pulmonary:     Effort: Pulmonary effort is normal. No respiratory distress.      Breath sounds: Normal breath sounds. No wheezing, rhonchi or rales.  Abdominal:     General: Abdomen is flat. Bowel sounds are normal. There is no distension.     Palpations: Abdomen is soft. There is no mass.     Tenderness: There is no abdominal tenderness. There is no guarding or rebound.     Hernia: No hernia is present.  Musculoskeletal: Normal range of motion.  Lymphadenopathy:     Cervical: No  cervical adenopathy.  Skin:    General: Skin is warm and dry.     Findings: No rash.  Neurological:     General: No focal deficit present.     Mental Status: She is alert and oriented to person, place, and time.     Comments:  CN grossly intact, station and gait intact Recall 2/3, 3/3 with cue Calculation 5/5 [id number]  Psychiatric:        Mood and Affect: Mood normal.        Behavior: Behavior normal.        Thought Content: Thought content normal.        Judgment: Judgment normal.       Results for orders placed or performed in visit on 11/04/18  Ferritin  Result Value Ref Range   Ferritin 38.1 10.0 - 291.0 ng/mL  IBC panel  Result Value Ref Range   Iron 173 (H) 42 - 145 ug/dL   Transferrin 273.0 212.0 - 360.0 mg/dL   Saturation Ratios 45.3 20.0 - 50.0 %  Microalbumin / creatinine urine ratio  Result Value Ref Range   Microalb, Ur 2.8 (H) 0.0 - 1.9 mg/dL   Creatinine,U 104.4 mg/dL   Microalb Creat Ratio 2.7 0.0 - 30.0 mg/g  VITAMIN D 25 Hydroxy (Vit-D Deficiency, Fractures)  Result Value Ref Range   VITD 68.94 30.00 - 100.00 ng/mL  Vitamin B12  Result Value Ref Range   Vitamin B-12 628 211 - 911 pg/mL  Comprehensive metabolic panel  Result Value Ref Range   Sodium 138 135 - 145 mEq/L   Potassium 4.6 3.5 - 5.1 mEq/L   Chloride 102 96 - 112 mEq/L   CO2 29 19 - 32 mEq/L   Glucose, Bld 94 70 - 99 mg/dL   BUN 16 6 - 23 mg/dL   Creatinine, Ser 0.90 0.40 - 1.20 mg/dL   Total Bilirubin 0.5 0.2 - 1.2 mg/dL   Alkaline Phosphatase 62 39 - 117 U/L   AST 17 0 - 37 U/L    ALT 14 0 - 35 U/L   Total Protein 6.3 6.0 - 8.3 g/dL   Albumin 4.1 3.5 - 5.2 g/dL   Calcium 9.8 8.4 - 10.5 mg/dL   GFR 59.38 (L) >60.00 mL/min  Lipid panel  Result Value Ref Range   Cholesterol 191 0 - 200 mg/dL   Triglycerides 102.0 0.0 - 149.0 mg/dL   HDL 84.00 >39.00 mg/dL   VLDL 20.4 0.0 - 40.0 mg/dL   LDL Cholesterol 86 0 - 99 mg/dL   Total CHOL/HDL Ratio 2    NonHDL 106.57   CBC with Differential/Platelet  Result Value Ref Range   WBC 4.4 4.0 - 10.5 K/uL   RBC 3.78 (L) 3.87 - 5.11 Mil/uL   Hemoglobin 11.8 (L) 12.0 - 15.0 g/dL   HCT 35.2 (L) 36.0 - 46.0 %   MCV 93.2 78.0 - 100.0 fl   MCHC 33.6 30.0 - 36.0 g/dL   RDW 12.4 11.5 - 15.5 %   Platelets 328.0 150.0 - 400.0 K/uL   Neutrophils Relative % 55.0 43.0 - 77.0 %   Lymphocytes Relative 26.8 12.0 - 46.0 %   Monocytes Relative 12.6 (H) 3.0 - 12.0 %   Eosinophils Relative 3.1 0.0 - 5.0 %   Basophils Relative 2.5 0.0 - 3.0 %   Neutro Abs 2.4 1.4 - 7.7 K/uL   Lymphs Abs 1.2 0.7 - 4.0 K/uL   Monocytes Absolute 0.6 0.1 - 1.0 K/uL   Eosinophils Absolute  0.1 0.0 - 0.7 K/uL   Basophils Absolute 0.1 0.0 - 0.1 K/uL   Assessment & Plan:   Problem List Items Addressed This Visit    Vitamin D deficiency    Continue vit D weekly replacement.       Medicare annual wellness visit, subsequent - Primary    I have personally reviewed the Medicare Annual Wellness questionnaire and have noted 1. The patient's medical and social history 2. Their use of alcohol, tobacco or illicit drugs 3. Their current medications and supplements 4. The patient's functional ability including ADL's, fall risks, home safety risks and hearing or visual impairment. Cognitive function has been assessed and addressed as indicated.  5. Diet and physical activity 6. Evidence for depression or mood disorders The patients weight, height, BMI have been recorded in the chart. I have made referrals, counseling and provided education to the patient based on  review of the above and I have provided the pt with a written personalized care plan for preventive services. Provider list updated.. See scanned questionairre as needed for further documentation. Reviewed preventative protocols and updated unless pt declined.       History of rectal cancer   History of colon cancer   Essential hypertension    Markedly elevated BP readings today - advised continue monitoring at home (states home readings normal) and RTC 1-2 mo repeat BP check. Patient asymptomatic today.       Relevant Medications   nitroGLYCERIN (NITROSTAT) 0.4 MG SL tablet   Carotid stenosis, asymptomatic, bilateral    Check carotid US      Relevant Medications   nitroGLYCERIN (NITROSTAT) 0.4 MG SL tablet   B12 deficiency    Levels repleted. Continue monthly B12 shots      Anemia associated with nutritional deficiency    Continue daily oral iron and monthly b12 shots.        Other Visit Diagnoses    Need for influenza vaccination       Relevant Orders   Flu Vaccine QUAD 36+ mos IM (Completed)       Meds ordered this encounter  Medications  . nitroGLYCERIN (NITROSTAT) 0.4 MG SL tablet    Sig: Place 1 tablet (0.4 mg total) under the tongue every 5 (five) minutes as needed for chest pain.    Dispense:  25 tablet    Refill:  1   Orders Placed This Encounter  Procedures  . Flu Vaccine QUAD 36+ mos IM    Follow up plan: Return in about 6 weeks (around 12/23/2018) for follow up visit.  Ria Bush, MD

## 2018-11-11 NOTE — Telephone Encounter (Signed)
Left message on vm per dpr asking pt, per Dr. Darnell Level, to bring her home BP monitor to her f/u appt.

## 2018-11-11 NOTE — Assessment & Plan Note (Signed)
Levels repleted. Continue monthly B12 shots

## 2018-11-18 ENCOUNTER — Ambulatory Visit (HOSPITAL_COMMUNITY)
Admission: RE | Admit: 2018-11-18 | Discharge: 2018-11-18 | Disposition: A | Discharge status: Home / Self Care | Payer: Medicare Other | Source: Ambulatory Visit | Attending: Cardiovascular Disease | Admitting: Cardiovascular Disease

## 2018-11-18 ENCOUNTER — Other Ambulatory Visit: Payer: Self-pay

## 2018-11-18 DIAGNOSIS — R0989 Other specified symptoms and signs involving the circulatory and respiratory systems: Secondary | ICD-10-CM | POA: Insufficient documentation

## 2018-11-19 ENCOUNTER — Encounter: Payer: Self-pay | Admitting: Family Medicine

## 2018-12-01 ENCOUNTER — Ambulatory Visit (INDEPENDENT_AMBULATORY_CARE_PROVIDER_SITE_OTHER): Payer: Medicare Other

## 2018-12-01 DIAGNOSIS — E538 Deficiency of other specified B group vitamins: Secondary | ICD-10-CM | POA: Diagnosis not present

## 2018-12-01 MED ORDER — CYANOCOBALAMIN 1000 MCG/ML IJ SOLN
1000.0000 ug | Freq: Once | INTRAMUSCULAR | Status: AC
  Administered 2018-12-01: 1000 ug via INTRAMUSCULAR

## 2018-12-01 NOTE — Progress Notes (Signed)
Per orders of Dr. Gutierrez, injection of vit B12 given by Ariona Deschene. Patient tolerated injection well.  

## 2018-12-23 ENCOUNTER — Encounter: Payer: Self-pay | Admitting: Family Medicine

## 2018-12-23 ENCOUNTER — Other Ambulatory Visit: Payer: Self-pay

## 2018-12-23 ENCOUNTER — Ambulatory Visit (INDEPENDENT_AMBULATORY_CARE_PROVIDER_SITE_OTHER): Payer: Medicare Other | Admitting: Family Medicine

## 2018-12-23 DIAGNOSIS — I119 Hypertensive heart disease without heart failure: Secondary | ICD-10-CM | POA: Diagnosis not present

## 2018-12-23 MED ORDER — HYDRALAZINE HCL 25 MG PO TABS: 25.0000 mg | ORAL_TABLET | Freq: Two times a day (BID) | ORAL | 0 refills | Status: DC | PRN

## 2018-12-23 NOTE — Progress Notes (Signed)
This visit was conducted in person.  BP (!) 196/76 (BP Location: Right Arm, Patient Position: Sitting, Cuff Size: Normal)   Pulse 69   Temp 97.8 F (36.6 C) (Temporal)   Ht 4' 11.5" (1.511 m)   Wt 152 lb 1 oz (69 kg)   SpO2 97%   BMI 30.20 kg/m   BP Readings from Last 3 Encounters:  12/23/18 (!) 196/76  11/11/18 (!) 178/64  08/17/18 (!) 117/43   on repeat, BP 200/70  CC: HTN f/u visit Subjective:    Patient ID: Priscilla [name], female    DOB: 06-08-1930, 85 y.o.   MRN: [id number]  HPI: Priscilla [name] is a 85 y.o. female presenting on 12/23/2018 for Hypertension (Here for 1-2 mp f/u.  Pt brought in home BP monitor to compare.  Her reading is 218/70, left arm. )   See prior note for details.  Seen here last month for medicare wellness visit f/u, elevated BP readings noted at that time. However she endorsed normal BP readings at home. Here for f/u. She did bring BP cuff to ensure accurate readings - comparable reading with home cuff today.   HTN - Compliant with current antihypertensive regimen of atenolol 50mg  bid, hctz 12.5mg  daily. Does check blood pressures at home and brings log - 122-159/47-53 (one isolated high to 174/53). Some low readings also noted 92-102/40-50 - feels exhausted when this happens. Denies HA, vision changes, CP/tightness, SOB, leg swelling.   She does see cardiology, had recent stable echocardiogram (08/2018).      Relevant past medical, surgical, family and social history reviewed and updated as indicated. Interim medical history since our last visit reviewed. Allergies and medications reviewed and updated. Outpatient Medications Prior to Visit  Medication Sig Dispense Refill  . aspirin 81 MG chewable tablet Chew 81 mg by mouth daily.    Marland Kitchen atenolol (TENORMIN) 50 MG tablet Take 1 tablet (50 mg total) by mouth 2 (two) times daily. 180 tablet 3  . Biotin (BIOTIN 5000) 5 MG CAPS Take by mouth daily.    . Calcium Carbonate (CALCIUM 600 PO) Take 1 tablet by  mouth daily.    . Cholecalciferol (VITAMIN D3) 1.25 MG (50000 UT) CAPS TAKE 1 CAPSULE BY MOUTH ONE TIME PER WEEK 12 capsule 1  . cyanocobalamin (,VITAMIN B-12,) 1000 MCG/ML injection Inject 1 mL (1,000 mcg total) into the muscle every 30 (thirty) days. Start 03/2014 1 mL 0  . ferrous sulfate 325 (65 FE) MG tablet Take 325 mg by mouth daily with breakfast.    . hydrochlorothiazide (HYDRODIURIL) 12.5 MG tablet Take 1 tablet (12.5 mg total) by mouth daily. 90 tablet 3  . nitroGLYCERIN (NITROSTAT) 0.4 MG SL tablet Place 1 tablet (0.4 mg total) under the tongue every 5 (five) minutes as needed for chest pain. 25 tablet 1   No facility-administered medications prior to visit.      Per HPI unless specifically indicated in ROS section below Review of Systems Objective:    BP (!) 196/76 (BP Location: Right Arm, Patient Position: Sitting, Cuff Size: Normal)   Pulse 69   Temp 97.8 F (36.6 C) (Temporal)   Ht 4' 11.5" (1.511 m)   Wt 152 lb 1 oz (69 kg)   SpO2 97%   BMI 30.20 kg/m   Wt Readings from Last 3 Encounters:  12/23/18 152 lb 1 oz (69 kg)  11/11/18 151 lb 9 oz (68.7 kg)  08/17/18 150 lb (68 kg)    Physical Exam Vitals signs and  nursing note reviewed.  Constitutional:      General: She is not in acute distress.    Appearance: Normal appearance. She is not ill-appearing.  HENT:     Mouth/Throat:     Mouth: Mucous membranes are moist.     Pharynx: Oropharynx is clear. No posterior oropharyngeal erythema.  Cardiovascular:     Rate and Rhythm: Normal rate and regular rhythm.     Pulses: Normal pulses.     Heart sounds: Normal heart sounds. No murmur.  Pulmonary:     Effort: Pulmonary effort is normal. No respiratory distress.     Breath sounds: Normal breath sounds. No wheezing, rhonchi or rales.  Neurological:     Mental Status: She is alert.  Psychiatric:        Mood and Affect: Mood normal.        Behavior: Behavior normal.       Assessment & Plan:   Problem List Items  Addressed This Visit    Benign hypertensive heart disease without heart failure    Anticipate due to vessel arteriosclerosis. Chronic, elevated again today however she does bring home logs showing some fluctuations in bp (ranging 90s-170s/40s-60s). Hesitant to make significant antihypertensive changes at this time. Will add hydralazine 25mg  BID PRN BP >160/100. Reviewed how to take medication. Update [place] with effect. Pt agrees with plan.       Relevant Medications   hydrALAZINE (APRESOLINE) 25 MG tablet       Meds ordered this encounter  Medications  . hydrALAZINE (APRESOLINE) 25 MG tablet    Sig: Take 1 tablet (25 mg total) by mouth 2 (two) times daily as needed (BP >160/100).    Dispense:  30 tablet    Refill:  0   No orders of the defined types were placed in this encounter.   Follow up plan: Return in about 4 months (around 04/25/2019), or if symptoms worsen or fail to improve.  Ria Bush, MD

## 2018-12-23 NOTE — Assessment & Plan Note (Signed)

## 2018-12-23 NOTE — Assessment & Plan Note (Addendum)
Anticipate due to vessel arteriosclerosis. Chronic, elevated again today however she does bring home logs showing some fluctuations in bp (ranging 90s-170s/40s-60s). Hesitant to make significant antihypertensive changes at this time. Will add hydralazine 25mg  BID PRN BP >160/100. Reviewed how to take medication. Update [place] with effect. Pt agrees with plan.

## 2018-12-23 NOTE — Patient Instructions (Signed)
Blood pressure was high today, however I'm glad home readings are averaging overall ok - so no changes today.  Goal blood pressures for you <150/90 Continue current medicines, add hydralazine 25mg  if blood pressure >160/100  Continue monitoring blood pressures at home, let me know if starting to stay too high or too low.

## 2018-12-31 ENCOUNTER — Other Ambulatory Visit: Payer: Self-pay | Admitting: Family Medicine

## 2019-01-05 ENCOUNTER — Other Ambulatory Visit: Payer: Self-pay

## 2019-01-05 ENCOUNTER — Ambulatory Visit (INDEPENDENT_AMBULATORY_CARE_PROVIDER_SITE_OTHER): Payer: Medicare Other

## 2019-01-05 DIAGNOSIS — E538 Deficiency of other specified B group vitamins: Secondary | ICD-10-CM | POA: Diagnosis not present

## 2019-01-05 MED ORDER — CYANOCOBALAMIN 1000 MCG/ML IJ SOLN
1000.0000 ug | Freq: Once | INTRAMUSCULAR | Status: AC
  Administered 2019-01-05: 09:00:00 1000 ug via INTRAMUSCULAR

## 2019-01-05 NOTE — Progress Notes (Signed)
Per orders of Dr. Gutierrez, injection of monthly B12 given by Couper Juncaj V Rosalie Buenaventura. Patient tolerated injection well.  

## 2019-01-06 ENCOUNTER — Other Ambulatory Visit: Payer: Self-pay | Admitting: Family Medicine

## 2019-02-03 DIAGNOSIS — M25511 Pain in right shoulder: Secondary | ICD-10-CM | POA: Diagnosis not present

## 2019-02-11 ENCOUNTER — Telehealth: Payer: Self-pay | Admitting: *Deleted

## 2019-02-11 NOTE — Telephone Encounter (Signed)
Left message on answering machine for patient to call back. When patient returns the call she needs to be advised that nurse visit Wednesday 02/16/19 will be curbside. Confirm negative covid screening and document information in appointment notes.

## 2019-02-16 ENCOUNTER — Ambulatory Visit (INDEPENDENT_AMBULATORY_CARE_PROVIDER_SITE_OTHER): Payer: Medicare Other

## 2019-02-16 ENCOUNTER — Other Ambulatory Visit: Payer: Self-pay

## 2019-02-16 DIAGNOSIS — E538 Deficiency of other specified B group vitamins: Secondary | ICD-10-CM | POA: Diagnosis not present

## 2019-02-16 MED ORDER — CYANOCOBALAMIN 1000 MCG/ML IJ SOLN
1000.0000 ug | Freq: Once | INTRAMUSCULAR | Status: AC
  Administered 2019-02-16: 1000 ug via INTRAMUSCULAR

## 2019-02-16 NOTE — Progress Notes (Signed)
Per orders of Dr. Ria Bush, injection of vit B 12 given by Brenton Grills. Patient tolerated injection well.

## 2019-03-21 ENCOUNTER — Other Ambulatory Visit: Payer: Self-pay | Admitting: Family Medicine

## 2019-03-22 NOTE — Telephone Encounter (Signed)
Does patient need to continue Vit D RX supplement? Refill sent from pharmacy

## 2019-03-24 ENCOUNTER — Encounter: Payer: Self-pay | Admitting: Family Medicine

## 2019-03-24 DIAGNOSIS — M12811 Other specific arthropathies, not elsewhere classified, right shoulder: Secondary | ICD-10-CM | POA: Insufficient documentation

## 2019-03-24 DIAGNOSIS — M75101 Unspecified rotator cuff tear or rupture of right shoulder, not specified as traumatic: Secondary | ICD-10-CM | POA: Insufficient documentation

## 2019-04-14 ENCOUNTER — Ambulatory Visit (INDEPENDENT_AMBULATORY_CARE_PROVIDER_SITE_OTHER): Payer: Medicare Other

## 2019-04-14 DIAGNOSIS — E538 Deficiency of other specified B group vitamins: Secondary | ICD-10-CM

## 2019-04-14 MED ORDER — CYANOCOBALAMIN 1000 MCG/ML IJ SOLN: 1000.0000 ug | Freq: Once | INTRAMUSCULAR | Status: DC

## 2019-04-14 MED ORDER — CYANOCOBALAMIN 1000 MCG/ML IJ SOLN
1000.0000 ug | Freq: Once | INTRAMUSCULAR | Status: AC
  Administered 2019-04-14: 09:00:00 1000 ug via INTRAMUSCULAR

## 2019-04-14 NOTE — Progress Notes (Signed)
Per Dr. Bosie Clos order, patient given B12 injection. Patient tolerated well. Sent to Dr. Glori Bickers.

## 2019-04-19 ENCOUNTER — Ambulatory Visit: Payer: Medicare Other

## 2019-04-28 ENCOUNTER — Ambulatory Visit: Payer: Medicare Other

## 2019-05-17 ENCOUNTER — Telehealth: Payer: Self-pay

## 2019-05-17 NOTE — Telephone Encounter (Signed)
Pt has NV tomorrow and needs to be screened for COVID LVM

## 2019-05-18 ENCOUNTER — Ambulatory Visit (INDEPENDENT_AMBULATORY_CARE_PROVIDER_SITE_OTHER): Payer: Medicare Other

## 2019-05-18 ENCOUNTER — Other Ambulatory Visit: Payer: Self-pay

## 2019-05-18 DIAGNOSIS — E538 Deficiency of other specified B group vitamins: Secondary | ICD-10-CM | POA: Diagnosis not present

## 2019-05-18 MED ORDER — CYANOCOBALAMIN 1000 MCG/ML IJ SOLN
1000.0000 ug | Freq: Once | INTRAMUSCULAR | Status: AC
  Administered 2019-05-18: 09:00:00 1000 ug via INTRAMUSCULAR

## 2019-05-18 NOTE — Progress Notes (Signed)
Per orders of Dr. Tower, injection of vit B12 given by Lataria Courser. Patient tolerated injection well.  

## 2019-05-20 DIAGNOSIS — M25511 Pain in right shoulder: Secondary | ICD-10-CM | POA: Diagnosis not present

## 2019-05-20 DIAGNOSIS — M25811 Other specified joint disorders, right shoulder: Secondary | ICD-10-CM | POA: Diagnosis not present

## 2019-05-26 ENCOUNTER — Ambulatory Visit (INDEPENDENT_AMBULATORY_CARE_PROVIDER_SITE_OTHER): Payer: Medicare Other | Admitting: Family Medicine

## 2019-05-26 ENCOUNTER — Encounter: Payer: Self-pay | Admitting: Family Medicine

## 2019-05-26 ENCOUNTER — Ambulatory Visit (INDEPENDENT_AMBULATORY_CARE_PROVIDER_SITE_OTHER)
Admission: RE | Admit: 2019-05-26 | Discharge: 2019-05-26 | Disposition: A | Discharge status: Home / Self Care | Payer: Medicare Other | Source: Ambulatory Visit | Attending: Family Medicine | Admitting: Family Medicine

## 2019-05-26 ENCOUNTER — Other Ambulatory Visit: Payer: Self-pay

## 2019-05-26 VITALS — BP 182/70 | HR 70 | Temp 97.5°F | Ht 59.5 in | Wt 153.5 lb

## 2019-05-26 DIAGNOSIS — M12811 Other specific arthropathies, not elsewhere classified, right shoulder: Secondary | ICD-10-CM

## 2019-05-26 DIAGNOSIS — D539 Nutritional anemia, unspecified: Secondary | ICD-10-CM

## 2019-05-26 DIAGNOSIS — I440 Atrioventricular block, first degree: Secondary | ICD-10-CM | POA: Diagnosis not present

## 2019-05-26 DIAGNOSIS — I517 Cardiomegaly: Secondary | ICD-10-CM | POA: Diagnosis not present

## 2019-05-26 DIAGNOSIS — I10 Essential (primary) hypertension: Secondary | ICD-10-CM

## 2019-05-26 DIAGNOSIS — I119 Hypertensive heart disease without heart failure: Secondary | ICD-10-CM

## 2019-05-26 DIAGNOSIS — Z01818 Encounter for other preprocedural examination: Secondary | ICD-10-CM | POA: Diagnosis not present

## 2019-05-26 DIAGNOSIS — R0600 Dyspnea, unspecified: Secondary | ICD-10-CM | POA: Diagnosis not present

## 2019-05-26 DIAGNOSIS — R0609 Other forms of dyspnea: Secondary | ICD-10-CM

## 2019-05-26 DIAGNOSIS — M75101 Unspecified rotator cuff tear or rupture of right shoulder, not specified as traumatic: Secondary | ICD-10-CM | POA: Diagnosis not present

## 2019-05-26 LAB — COMPREHENSIVE METABOLIC PANEL
ALT: 15 U/L (ref 0–35)
AST: 21 U/L (ref 0–37)
Albumin: 4.2 g/dL (ref 3.5–5.2)
Alkaline Phosphatase: 68 U/L (ref 39–117)
BUN: 22 mg/dL (ref 6–23)
CO2: 30 mEq/L (ref 19–32)
Calcium: 10.1 mg/dL (ref 8.4–10.5)
Chloride: 98 mEq/L (ref 96–112)
Creatinine, Ser: 0.97 mg/dL (ref 0.40–1.20)
GFR: 54.4 mL/min — ABNORMAL LOW (ref >60.00)
Glucose, Bld: 92 mg/dL (ref 70–99)
Potassium: 4.2 mEq/L (ref 3.5–5.1)
Sodium: 134 mEq/L — ABNORMAL LOW (ref 135–145)
Total Bilirubin: 0.7 mg/dL (ref 0.2–1.2)
Total Protein: 7.2 g/dL (ref 6.0–8.3)

## 2019-05-26 LAB — CBC WITH DIFFERENTIAL/PLATELET
Basophils Absolute: 0.1 10*3/uL (ref 0.0–0.1)
Basophils Relative: 3.1 % — ABNORMAL HIGH (ref 0.0–3.0)
Eosinophils Absolute: 0.1 10*3/uL (ref 0.0–0.7)
Eosinophils Relative: 2 % (ref 0.0–5.0)
HCT: 36.7 % (ref 36.0–46.0)
Hemoglobin: 12.5 g/dL (ref 12.0–15.0)
Lymphocytes Relative: 22.7 % (ref 12.0–46.0)
Lymphs Abs: 1.1 10*3/uL (ref 0.7–4.0)
MCHC: 34 g/dL (ref 30.0–36.0)
MCV: 91.1 fl (ref 78.0–100.0)
Monocytes Absolute: 0.6 10*3/uL (ref 0.1–1.0)
Monocytes Relative: 13.4 % — ABNORMAL HIGH (ref 3.0–12.0)
Neutro Abs: 2.8 10*3/uL (ref 1.4–7.7)
Neutrophils Relative %: 58.8 % (ref 43.0–77.0)
Platelets: 299 10*3/uL (ref 150.0–400.0)
RBC: 4.03 Mil/uL (ref 3.87–5.11)
RDW: 13 % (ref 11.5–15.5)
WBC: 4.8 10*3/uL (ref 4.0–10.5)

## 2019-05-26 LAB — PROTIME-INR
INR: 0.9 ratio (ref 0.8–1.0)
Prothrombin Time: 10.1 s (ref 9.6–13.1)

## 2019-05-26 LAB — BRAIN NATRIURETIC PEPTIDE: Pro B Natriuretic peptide (BNP): 179 pg/mL — ABNORMAL HIGH (ref 0.0–100.0)

## 2019-05-26 MED ORDER — AMLODIPINE BESYLATE 5 MG PO TABS: 5.0000 mg | ORAL_TABLET | Freq: Every day | ORAL | 3 refills | Status: DC

## 2019-05-26 MED ORDER — ATENOLOL 25 MG PO TABS: 25.0000 mg | ORAL_TABLET | Freq: Two times a day (BID) | ORAL | 1 refills | Status: DC

## 2019-05-26 NOTE — Progress Notes (Addendum)
This visit was conducted in person.  BP (!) 182/70 (BP Location: Right Arm, Patient Position: Sitting, Cuff Size: Normal)   Pulse 70   Temp (!) 97.5 F (36.4 C) (Temporal)   Ht 4' 11.5" (1.511 m)   Wt 153 lb 8 oz (69.6 kg)   SpO2 97%   BMI 30.48 kg/m   BP remains high on retesting.   CC: preop eval Subjective:    Patient ID: Priscilla [name], female    DOB: [birth year], 86 y.o.   MRN: [id number]  HPI: Priscilla [name] is a 86 y.o. female presenting on 05/26/2019 for Pre-op Exam (Needs surgery on right shoulder, not scheduled. )   Planned R reverse shoulder arthroplasty under general anesthesia by Dr Onnie Graham. Latest ortho note reviewed. Failed repeat steroid injections. MRI showed RTC tear arthropathy with chronic severely retracted RTC tear. Shoulder pain is progressive and activity limiting.   She last saw cardiology 07/2018 for virtual visit and had recent stable echocardiogram 08/2018. Nuclear stress test 2019 did not show ischemia. Known mild AI and MR.   She has previously tolerated general anesthesia, latest surgery exploratory laparotomy and bowel resection for small bowel obstruction 2012. She does tend to get post op nausea/vomiting. No difficulty awakening.   No h/o OSA. Denies chest pain, tightness, dyspnea. Chronic exertional dyspnea but she doesn't have trouble going up and down her stairs at home - does this about 12-13 times a day without chest pain or dyspnea.   HTN - on atenolol 50mg  bid, hctz 12.5mg  daily. H/o cough to lisinopril and losartan. Home readings tend to be much better controlled than in office. Home BP cuff checked last visit (12/2018) and readings were comparable to our cuff. She has been She has hydralazine 25mg  PRN BP >160/100 - has only used this once. Brings log - home readings 130-180/45-55, HR 60s. Denies HA, vision changes, CP/tightness, SOB, leg swelling.      Relevant past medical, surgical, family and social history reviewed and updated as indicated.  Interim medical history since our last visit reviewed. Allergies and medications reviewed and updated. Outpatient Medications Prior to Visit  Medication Sig Dispense Refill  . aspirin 81 MG chewable tablet Chew 81 mg by mouth daily.    . Biotin (BIOTIN 5000) 5 MG CAPS Take by mouth daily.    . Calcium Carbonate (CALCIUM 600 PO) Take 1 tablet by mouth daily.    . cyanocobalamin (,VITAMIN B-12,) 1000 MCG/ML injection Inject 1 mL (1,000 mcg total) into the muscle every 30 (thirty) days. Start 03/2014 1 mL 0  . D3-50 1.25 MG (50000 UT) capsule TAKE 1 CAPSULE BY MOUTH ONE TIME PER WEEK 12 capsule 1  . ferrous sulfate 325 (65 FE) MG tablet Take 325 mg by mouth daily with breakfast.    . hydrALAZINE (APRESOLINE) 25 MG tablet TAKE 1 TABLET BY MOUTH TWICE A DAY AS NEEDED (BP>160/100) 30 tablet 0  . hydrochlorothiazide (HYDRODIURIL) 12.5 MG tablet Take 1 tablet (12.5 mg total) by mouth daily. 90 tablet 3  . nitroGLYCERIN (NITROSTAT) 0.4 MG SL tablet Place 1 tablet (0.4 mg total) under the tongue every 5 (five) minutes as needed for chest pain. 25 tablet 1  . atenolol (TENORMIN) 50 MG tablet Take 1 tablet (50 mg total) by mouth 2 (two) times daily. 180 tablet 3   Facility-Administered Medications Prior to Visit  Medication Dose Route Frequency Provider Last Rate Last Admin  . cyanocobalamin ((VITAMIN B-12)) injection 1,000 mcg  1,000 mcg Intramuscular Once Danise Mina,  Garlon Hatchet, MD         Per HPI unless specifically indicated in ROS section below Review of Systems Objective:    BP (!) 182/70 (BP Location: Right Arm, Patient Position: Sitting, Cuff Size: Normal)   Pulse 70   Temp (!) 97.5 F (36.4 C) (Temporal)   Ht 4' 11.5" (1.511 m)   Wt 153 lb 8 oz (69.6 kg)   SpO2 97%   BMI 30.48 kg/m   Wt Readings from Last 3 Encounters:  05/26/19 153 lb 8 oz (69.6 kg)  12/23/18 152 lb 1 oz (69 kg)  11/11/18 151 lb 9 oz (68.7 kg)    Physical Exam Vitals and nursing note reviewed.  Constitutional:       Appearance: Normal appearance. She is not ill-appearing.  Eyes:     Extraocular Movements: Extraocular movements intact.     Pupils: Pupils are equal, round, and reactive to light.  Cardiovascular:     Rate and Rhythm: Normal rate and regular rhythm.     Pulses: Normal pulses.     Heart sounds: Normal heart sounds. No murmur.  Pulmonary:     Effort: Pulmonary effort is normal. No respiratory distress.     Breath sounds: Normal breath sounds. No wheezing, rhonchi or rales.  Musculoskeletal:     Right lower leg: No edema.     Left lower leg: No edema.  Skin:    Findings: No rash.  Neurological:     Mental Status: She is alert.  Psychiatric:        Mood and Affect: Mood normal.        Behavior: Behavior normal.       Results for orders placed or performed in visit on 05/26/19  Comprehensive metabolic panel  Result Value Ref Range   Sodium 134 (L) 135 - 145 mEq/L   Potassium 4.2 3.5 - 5.1 mEq/L   Chloride 98 96 - 112 mEq/L   CO2 30 19 - 32 mEq/L   Glucose, Bld 92 70 - 99 mg/dL   BUN 22 6 - 23 mg/dL   Creatinine, Ser 0.97 0.40 - 1.20 mg/dL   Total Bilirubin 0.7 0.2 - 1.2 mg/dL   Alkaline Phosphatase 68 39 - 117 U/L   AST 21 0 - 37 U/L   ALT 15 0 - 35 U/L   Total Protein 7.2 6.0 - 8.3 g/dL   Albumin 4.2 3.5 - 5.2 g/dL   GFR 54.40 (L) >60.00 mL/min   Calcium 10.1 8.4 - 10.5 mg/dL  CBC with Differential/Platelet  Result Value Ref Range   WBC 4.8 4.0 - 10.5 K/uL   RBC 4.03 3.87 - 5.11 Mil/uL   Hemoglobin 12.5 12.0 - 15.0 g/dL   HCT 36.7 36.0 - 46.0 %   MCV 91.1 78.0 - 100.0 fl   MCHC 34.0 30.0 - 36.0 g/dL   RDW 13.0 11.5 - 15.5 %   Platelets 299.0 150.0 - 400.0 K/uL   Neutrophils Relative % 58.8 43.0 - 77.0 %   Lymphocytes Relative 22.7 12.0 - 46.0 %   Monocytes Relative 13.4 (H) 3.0 - 12.0 %   Eosinophils Relative 2.0 0.0 - 5.0 %   Basophils Relative 3.1 (H) 0.0 - 3.0 %   Neutro Abs 2.8 1.4 - 7.7 K/uL   Lymphs Abs 1.1 0.7 - 4.0 K/uL   Monocytes Absolute 0.6 0.1 -  1.0 K/uL   Eosinophils Absolute 0.1 0.0 - 0.7 K/uL   Basophils Absolute 0.1 0.0 - 0.1 K/uL  Protime-INR  Result Value Ref Range   INR 0.9 0.8 - 1.0 ratio   Prothrombin Time 10.1 9.6 - 13.1 sec  Brain natriuretic peptide  Result Value Ref Range   Pro B Natriuretic peptide (BNP) 179.0 (H) 0.0 - 100.0 pg/mL   DG Chest 2 View CLINICAL DATA:  Preoperative evaluation.  EXAM: CHEST - 2 VIEW  COMPARISON:  PA and lateral chest 09/30/2017.  FINDINGS: Lungs clear. There is cardiomegaly. Atherosclerosis. No pneumothorax or pleural effusion. No acute or focal bony abnormality.  IMPRESSION: No acute disease.  Cardiomegaly.  Atherosclerosis.  Electronically Signed   By: Inge Rise M.D.   On: 05/26/2019 14:42   EKG - NSR 60s, LAD, 1st degree AV block, no acute ST/T changes Assessment & Plan:  This visit occurred during the SARS-CoV-2 public health emergency.  Safety protocols were in place, including screening questions prior to the visit, additional usage of staff PPE, and extensive cleaning of exam room while observing appropriate contact time as indicated for disinfecting solutions.   Problem List Items Addressed This Visit    Rotator cuff tear arthropathy of right shoulder    Planned reverse shoulder arthroplasty      Preop examination - Primary    RCRI = 0.  Check EKG, labs (including BNP), CXR today.  If reassuring, anticipate adequately low risk to proceed with surgery.  See above regarding 1st degree AV block noted.       Relevant Orders   EKG 12-Lead (Completed)   DG Chest 2 View (Completed)   Comprehensive metabolic panel (Completed)   CBC with Differential/Platelet (Completed)   Protime-INR (Completed)   Brain natriuretic peptide (Completed)   First degree AV block    Noted on EKG. Will decrease atenolol to 25mg  bid, start amlodipine. See below.  RTC 3-4 wks f/u visit.       Relevant Medications   amLODipine (NORVASC) 5 MG tablet   atenolol (TENORMIN)  25 MG tablet   Essential hypertension    See above.       Relevant Medications   amLODipine (NORVASC) 5 MG tablet   atenolol (TENORMIN) 25 MG tablet   DOE (dyspnea on exertion)   Relevant Orders   Brain natriuretic peptide (Completed)   Benign hypertensive heart disease without heart failure    With wide pulse pressure anticipate due to vessel arteriosclerosis.  Chronic - tends to run high at OV but better controlled at home.  With noted 1st degree AV block on EKG, will decrease atenolol dose and start amlodipine 5mg  along with her hctz 12.5mg . RTC 3-4 wks recheck BP. She has hydralazine 25mg  PRN BP >160/100.       Relevant Medications   amLODipine (NORVASC) 5 MG tablet   atenolol (TENORMIN) 25 MG tablet   Anemia associated with nutritional deficiency   Relevant Orders   Protime-INR (Completed)       Meds ordered this encounter  Medications  . amLODipine (NORVASC) 5 MG tablet    Sig: Take 1 tablet (5 mg total) by mouth daily.    Dispense:  90 tablet    Refill:  3  . atenolol (TENORMIN) 25 MG tablet    Sig: Take 1 tablet (25 mg total) by mouth 2 (two) times daily.    Dispense:  180 tablet    Refill:  1    Note new sig   Orders Placed This Encounter  Procedures  . DG Chest 2 View    Standing Status:   Future    Number  of Occurrences:   1    Standing Expiration Date:   07/25/2020    Order Specific Question:   Reason for Exam (SYMPTOM  OR DIAGNOSIS REQUIRED)    Answer:   preop eval    Order Specific Question:   Preferred imaging location?    Answer:   [facility]    Order Specific Question:   Radiology Contrast Protocol - do NOT remove file path    Answer:   [email or web address]  . Comprehensive metabolic panel  . CBC with Differential/Platelet  . Protime-INR  . Brain natriuretic peptide  . EKG 12-Lead    Follow up plan: Return in about 4 weeks (around 06/23/2019) for follow up visit.  Ria Bush, MD

## 2019-05-26 NOTE — Assessment & Plan Note (Signed)
See above

## 2019-05-26 NOTE — Assessment & Plan Note (Addendum)
With wide pulse pressure anticipate due to vessel arteriosclerosis.  Chronic - tends to run high at OV but better controlled at home.  With noted 1st degree AV block on EKG, will decrease atenolol dose and start amlodipine 5mg  along with her hctz 12.5mg . RTC 3-4 wks recheck BP. She has hydralazine 25mg  PRN BP >160/100.

## 2019-05-26 NOTE — Patient Instructions (Addendum)
Labs today EKG and chest xray today.  I'd like you to decrease atenolol dose to 25mg  twice daily - new dose sent to pharmacy. I'd also like to start new blood pressure medicine amlodipine 5mg  daily. Continue hydrochlorothiazide 12.5mg  daily.  Continue monitoring blood pressures at home, let us know how they are running.  Schedule recheck visit in 3-4 weeks.

## 2019-05-26 NOTE — Assessment & Plan Note (Addendum)
Noted on EKG. Will decrease atenolol to 25mg  bid, start amlodipine. See below.  RTC 3-4 wks f/u visit.

## 2019-05-26 NOTE — Assessment & Plan Note (Addendum)
RCRI = 0.  Check EKG, labs (including BNP), CXR today.  If reassuring, anticipate adequately low risk to proceed with surgery.  See above regarding 1st degree AV block noted.

## 2019-05-26 NOTE — Assessment & Plan Note (Signed)
Planned reverse shoulder arthroplasty

## 2019-06-08 ENCOUNTER — Telehealth: Payer: Self-pay

## 2019-06-08 NOTE — Telephone Encounter (Signed)
Noted. Thanks. Agree with good readings. No changes at this time.

## 2019-06-08 NOTE — Telephone Encounter (Signed)
Pt calling with BP updates; 06/04/19 BP 123/53 P 77 06/05/19 BP 134/52 P 73 06/06/19 BP 153/53 P 71 pt not sure why elevated BP 06/07/19 in AM BP 125/47 P 75 06/07/19 in afternoon BP 145/57 P 71 06/08/19 BP 129/47 P 72  Pt has been taking Atenolol 25 mg bid,Amlodipine 5 mg daily and HCTZ 12.5 mg daily. Pt said she feels OK. No problems noted; pt will continue to monitor BP daily and will bring results to FU appt on 06/21/19. If elevated or low BP readings or change in condition prior to 06/21/19 appt pt will cb. [facility].

## 2019-06-21 ENCOUNTER — Encounter: Payer: Self-pay | Admitting: Family Medicine

## 2019-06-21 ENCOUNTER — Ambulatory Visit (INDEPENDENT_AMBULATORY_CARE_PROVIDER_SITE_OTHER): Payer: Medicare Other | Admitting: Family Medicine

## 2019-06-21 ENCOUNTER — Other Ambulatory Visit: Payer: Self-pay

## 2019-06-21 VITALS — BP 138/64 | HR 74 | Temp 97.6°F | Ht 59.5 in | Wt 152.4 lb

## 2019-06-21 DIAGNOSIS — Z01818 Encounter for other preprocedural examination: Secondary | ICD-10-CM | POA: Diagnosis not present

## 2019-06-21 DIAGNOSIS — I119 Hypertensive heart disease without heart failure: Secondary | ICD-10-CM | POA: Diagnosis not present

## 2019-06-21 DIAGNOSIS — E538 Deficiency of other specified B group vitamins: Secondary | ICD-10-CM | POA: Diagnosis not present

## 2019-06-21 MED ORDER — CYANOCOBALAMIN 1000 MCG/ML IJ SOLN
1000.0000 ug | Freq: Once | INTRAMUSCULAR | Status: AC
  Administered 2019-06-21: 1000 ug via INTRAMUSCULAR

## 2019-06-21 NOTE — Assessment & Plan Note (Signed)
B12 shot today. 

## 2019-06-21 NOTE — Progress Notes (Signed)
This visit was conducted in person.  BP 138/64 (BP Location: Left Arm, Patient Position: Sitting, Cuff Size: Normal)   Pulse 74   Temp 97.6 F (36.4 C) (Temporal)   Ht 4' 11.5" (1.511 m)   Wt 152 lb 7 oz (69.1 kg)   SpO2 97%   BMI 30.27 kg/m   BP Readings from Last 3 Encounters:  06/21/19 138/64  05/26/19 (!) 182/70  12/23/18 (!) 196/76    CC: 1 mo f/u visit  Subjective:    Patient ID: Alfredo Martinez, female    DOB: 09/30/1930, 86 y.o.   MRN: [id number]  HPI: Jaris Agundez is a 86 y.o. female presenting on 06/21/2019 for Follow-up (Here for 4 wk f/u.)   See prior note for details.  Seen here last month for preop evaluation prior to R reverse shoulder arthroplasty under GETA (Supple), at that time found to have markedly elevated blood pressures so we changed BP regimen. Currently on atenolol 25mg  bid, amlodipine 5mg  daily, hctz 12.5mg  daily. Has not needed her PRN hydralazine. ACEI/ARB caused cough previously.   Since then, BP levels have normalized.  No HA, vision changes, CP/tightness, SOB.  No dizziness, lightheadedness, syncope.  L ankle swells sometimes.   Home readings: 06/04/19 BP 123/53 P 77 06/05/19 BP 134/52 P 73 06/06/19 BP 153/53 P 71 pt not sure why elevated BP 06/07/19 in AM BP 125/47 P 75 06/07/19 in afternoon BP 145/57 P 71 06/08/19 BP 129/47 P 72 3/20/221 BP 125/50, 71 06/14/19 BP 123/48, 72 06/15/19 BP 144/46, 69 06/17/19 BP 123/46, 75 06/19/19 BP 127/53, 75     Relevant past medical, surgical, family and social history reviewed and updated as indicated. Interim medical history since our last visit reviewed. Allergies and medications reviewed and updated. Outpatient Medications Prior to Visit  Medication Sig Dispense Refill  . amLODipine (NORVASC) 5 MG tablet Take 1 tablet (5 mg total) by mouth daily. 90 tablet 3  . aspirin 81 MG chewable tablet Chew 81 mg by mouth daily.    Marland Kitchen atenolol (TENORMIN) 25 MG tablet Take 1 tablet (25 mg total) by mouth 2  (two) times daily. 180 tablet 1  . Biotin (BIOTIN 5000) 5 MG CAPS Take by mouth daily.    . Calcium Carbonate (CALCIUM 600 PO) Take 1 tablet by mouth daily.    . cyanocobalamin (,VITAMIN B-12,) 1000 MCG/ML injection Inject 1 mL (1,000 mcg total) into the muscle every 30 (thirty) days. Start 03/2014 1 mL 0  . D3-50 1.25 MG (50000 UT) capsule TAKE 1 CAPSULE BY MOUTH ONE TIME PER WEEK 12 capsule 1  . ferrous sulfate 325 (65 FE) MG tablet Take 325 mg by mouth daily with breakfast.    . hydrALAZINE (APRESOLINE) 25 MG tablet TAKE 1 TABLET BY MOUTH TWICE A DAY AS NEEDED (BP>160/100) 30 tablet 0  . hydrochlorothiazide (HYDRODIURIL) 12.5 MG tablet Take 1 tablet (12.5 mg total) by mouth daily. 90 tablet 3  . nitroGLYCERIN (NITROSTAT) 0.4 MG SL tablet Place 1 tablet (0.4 mg total) under the tongue every 5 (five) minutes as needed for chest pain. 25 tablet 1   Facility-Administered Medications Prior to Visit  Medication Dose Route Frequency Provider Last Rate Last Admin  . cyanocobalamin ((VITAMIN B-12)) injection 1,000 mcg  1,000 mcg Intramuscular Once Ria Bush, MD         Per HPI unless specifically indicated in ROS section below Review of Systems Objective:    BP 138/64 (BP Location: Left Arm, Patient Position: Sitting,  Cuff Size: Normal)   Pulse 74   Temp 97.6 F (36.4 C) (Temporal)   Ht 4' 11.5" (1.511 m)   Wt 152 lb 7 oz (69.1 kg)   SpO2 97%   BMI 30.27 kg/m   Wt Readings from Last 3 Encounters:  06/21/19 152 lb 7 oz (69.1 kg)  05/26/19 153 lb 8 oz (69.6 kg)  12/23/18 152 lb 1 oz (69 kg)    Physical Exam Vitals and nursing note reviewed.  Constitutional:      Appearance: Normal appearance. She is not ill-appearing.  Eyes:     Extraocular Movements: Extraocular movements intact.     Pupils: Pupils are equal, round, and reactive to light.  Cardiovascular:     Rate and Rhythm: Normal rate and regular rhythm.     Pulses: Normal pulses.     Heart sounds: Normal heart sounds.  No murmur.  Pulmonary:     Effort: Pulmonary effort is normal. No respiratory distress.     Breath sounds: Normal breath sounds. No wheezing, rhonchi or rales.  Musculoskeletal:     Right lower leg: No edema.     Left lower leg: No edema.  Skin:    General: Skin is warm and dry.     Findings: No rash.  Neurological:     Mental Status: She is alert.  Psychiatric:        Mood and Affect: Mood normal.        Behavior: Behavior normal.       Results for orders placed or performed in visit on 05/26/19  Comprehensive metabolic panel  Result Value Ref Range   Sodium 134 (L) 135 - 145 mEq/L   Potassium 4.2 3.5 - 5.1 mEq/L   Chloride 98 96 - 112 mEq/L   CO2 30 19 - 32 mEq/L   Glucose, Bld 92 70 - 99 mg/dL   BUN 22 6 - 23 mg/dL   Creatinine, Ser 0.97 0.40 - 1.20 mg/dL   Total Bilirubin 0.7 0.2 - 1.2 mg/dL   Alkaline Phosphatase 68 39 - 117 U/L   AST 21 0 - 37 U/L   ALT 15 0 - 35 U/L   Total Protein 7.2 6.0 - 8.3 g/dL   Albumin 4.2 3.5 - 5.2 g/dL   GFR 54.40 (L) >60.00 mL/min   Calcium 10.1 8.4 - 10.5 mg/dL  CBC with Differential/Platelet  Result Value Ref Range   WBC 4.8 4.0 - 10.5 K/uL   RBC 4.03 3.87 - 5.11 Mil/uL   Hemoglobin 12.5 12.0 - 15.0 g/dL   HCT 36.7 36.0 - 46.0 %   MCV 91.1 78.0 - 100.0 fl   MCHC 34.0 30.0 - 36.0 g/dL   RDW 13.0 11.5 - 15.5 %   Platelets 299.0 150.0 - 400.0 K/uL   Neutrophils Relative % 58.8 43.0 - 77.0 %   Lymphocytes Relative 22.7 12.0 - 46.0 %   Monocytes Relative 13.4 (H) 3.0 - 12.0 %   Eosinophils Relative 2.0 0.0 - 5.0 %   Basophils Relative 3.1 (H) 0.0 - 3.0 %   Neutro Abs 2.8 1.4 - 7.7 K/uL   Lymphs Abs 1.1 0.7 - 4.0 K/uL   Monocytes Absolute 0.6 0.1 - 1.0 K/uL   Eosinophils Absolute 0.1 0.0 - 0.7 K/uL   Basophils Absolute 0.1 0.0 - 0.1 K/uL  Protime-INR  Result Value Ref Range   INR 0.9 0.8 - 1.0 ratio   Prothrombin Time 10.1 9.6 - 13.1 sec  Brain natriuretic peptide  Result  Value Ref Range   Pro B Natriuretic peptide (BNP)  179.0 (H) 0.0 - 100.0 pg/mL   Lab Results  Component Value Date   VITAMINB12 628 11/04/2018    Assessment & Plan:  This visit occurred during the SARS-CoV-2 public health emergency.  Safety protocols were in place, including screening questions prior to the visit, additional usage of staff PPE, and extensive cleaning of exam room while observing appropriate contact time as indicated for disinfecting solutions.   Problem List Items Addressed This Visit    Vitamin B12 deficiency    B12 shot today.       Preop examination    Improved BP control. Anticipate ready to proceed with planned surgery. Will forward today's note to Dr Susie Cassette office.  Regarding aspirin, as it's being used for primary prevention, should be ok to hold for amt of time recommended by ortho.       Benign hypertensive heart disease without heart failure - Primary    Marked improvement in BP control with recent changes. Continue current regimen.           Meds ordered this encounter  Medications  . cyanocobalamin ((VITAMIN B-12)) injection 1,000 mcg   No orders of the defined types were placed in this encounter.   Patient Instructions  You are doing well today We will forward today's note to Dr Onnie Graham.  I hope for a speedy recovery with upcoming surgery!   Follow up plan: Return in about 5 months (around 11/21/2019) for medicare wellness visit.  Ria Bush, MD

## 2019-06-21 NOTE — Assessment & Plan Note (Signed)
Marked improvement in BP control with recent changes. Continue current regimen.

## 2019-06-21 NOTE — Patient Instructions (Addendum)
You are doing well today We will forward today's note to Dr Onnie Graham.  I hope for a speedy recovery with upcoming surgery!

## 2019-06-21 NOTE — Assessment & Plan Note (Signed)
Improved BP control. Anticipate ready to proceed with planned surgery. Will forward today's note to Dr Susie Cassette office.  Regarding aspirin, as it's being used for primary prevention, should be ok to hold for amt of time recommended by ortho.

## 2019-07-18 NOTE — Patient Instructions (Addendum)
DUE TO COVID-19 ONLY ONE VISITOR IS ALLOWED TO COME WITH YOU AND STAY IN THE WAITING ROOM ONLY DURING PRE OP AND PROCEDURE DAY OF SURGERY. THE 1 VISITOR MAY VISIT WITH YOU AFTER SURGERY IN YOUR PRIVATE ROOM DURING VISITING HOURS ONLY!  YOU NEED TO HAVE A COVID 19 TEST ON: 07/25/19 @ 11:00 am , THIS TEST MUST BE DONE BEFORE SURGERY, COME  [place], [address] , [id number].  ([place]) ONCE YOUR COVID TEST IS COMPLETED, PLEASE BEGIN THE QUARANTINE INSTRUCTIONS AS OUTLINED IN YOUR HANDOUT.                Priscilla [name]    Your procedure is scheduled on: 07/28/19   Report to [facility] Main  Entrance   Report to admitting at: 10:00  AM     Call this number if you have problems the morning of surgery [phone]    Remember:    [place], NO [place].     Take these medicines the morning of surgery with A SIP OF WATER: amlodipine.                                 You may not have any metal on your body including hair pins and              piercings  Do not wear jewelry, make-up, lotions, powders or perfumes, deodorant             Do not wear nail polish on your fingernails.  Do not shave  48 hours prior to surgery.            Do not bring valuables to the hospital. [place].  Contacts, dentures or bridgework may not be worn into surgery.  Leave suitcase in the car. After surgery it may be brought to your room.     Patients discharged the day of surgery will not be allowed to drive home. IF YOU ARE HAVING SURGERY AND GOING HOME THE SAME DAY, YOU MUST HAVE AN ADULT TO DRIVE YOU HOME AND BE WITH YOU FOR 24 HOURS. YOU MAY GO HOME BY TAXI OR UBER OR ORTHERWISE, BUT AN ADULT MUST ACCOMPANY YOU HOME AND STAY WITH YOU FOR 24 HOURS.  Name and phone number of your driver:  Special Instructions: N/A              Please read over the following  fact sheets you were given: _____________________________________________________________________   NO SOLID FOOD AFTER MIDNIGHT THE NIGHT PRIOR TO SURGERY. NOTHING BY MOUTH EXCEPT CLEAR LIQUIDS UNTIL: 9:30 am . PLEASE FINISH ENSURE DRINK PER SURGEON ORDER  WHICH NEEDS TO BE COMPLETED AT: 9:30 am .   CLEAR LIQUID DIET   Foods Allowed                                                                     Foods Excluded  Coffee and tea, regular and decaf  liquids that you cannot  Plain Jell-O any favor except red or purple                                           see through such as: Fruit ices (not with fruit pulp)                                     milk, soups, orange juice  Iced Popsicles                                    All solid food Carbonated beverages, regular and diet                                    Cranberry, grape and apple juices Sports drinks like Gatorade Lightly seasoned clear broth or consume(fat free) Sugar, honey syrup  Sample Menu Breakfast                                Lunch                                     Supper Cranberry juice                    Beef broth                            Chicken broth Jell-O                                     Grape juice                           Apple juice Coffee or tea                        Jell-O                                      Popsicle                                                Coffee or tea                        Coffee or tea  _____________________________________________________________________  [facility]- Preparing for Total Shoulder Arthroplasty    Before surgery, you can play an important role. Because skin is not sterile, your skin needs to be as free of germs as possible. You can reduce the number of germs on your skin by using the following products. . Benzoyl Peroxide Gel o Reduces the number of germs  present on the skin o Applied twice a day to shoulder area starting  two days before surgery    ==================================================================  Please follow these instructions carefully:  BENZOYL PEROXIDE 5% GEL  Please do not use if you have an allergy to benzoyl peroxide.   If your skin becomes reddened/irritated stop using the benzoyl peroxide.  Starting two days before surgery, apply as follows: 1. Apply benzoyl peroxide in the morning and at night. Apply after taking a shower. If you are not taking a shower clean entire shoulder front, back, and side along with the armpit with a clean wet washcloth.  2. Place a quarter-sized dollop on your shoulder and rub in thoroughly, making sure to cover the front, back, and side of your shoulder, along with the armpit.   2 days before ____ AM   ____ PM              1 day before ____ AM   ____ PM                         3. Do this twice a day for two days.  (Last application is the night before surgery, AFTER using the CHG soap as described below).  4. Do NOT apply benzoyl peroxide gel on the day of surgery. [place] - Preparing for Surgery Before surgery, you can play an important role.  Because skin is not sterile, your skin needs to be as free of germs as possible.  You can reduce the number of germs on your skin by washing with CHG (chlorahexidine gluconate) soap before surgery.  CHG is an antiseptic cleaner which kills germs and bonds with the skin to continue killing germs even after washing. Please DO NOT use if you have an allergy to CHG or antibacterial soaps.  If your skin becomes reddened/irritated stop using the CHG and inform your nurse when you arrive at Short Stay. Do not shave (including legs and underarms) for at least 48 hours prior to the first CHG shower.  You may shave your face/neck. Please follow these instructions carefully:  1.  Shower with CHG Soap the night before surgery and the  morning of Surgery.  2.  If you choose to wash your hair, wash your hair first as  usual with your  normal  shampoo.  3.  After you shampoo, rinse your hair and body thoroughly to remove the  shampoo.                           4.  Use CHG as you would any other liquid soap.  You can apply chg directly  to the skin and wash                       Gently with a scrungie or clean washcloth.  5.  Apply the CHG Soap to your body ONLY FROM THE NECK DOWN.   Do not use on face/ open                           Wound or open sores. Avoid contact with eyes, ears mouth and genitals (private parts).                       Wash face,  Genitals (private parts) with your normal soap.  6.  Wash thoroughly, paying special attention to the area where your surgery  will be performed.  7.  Thoroughly rinse your body with warm water from the neck down.  8.  DO NOT shower/wash with your normal soap after using and rinsing off  the CHG Soap.                9.  Pat yourself dry with a clean towel.            10.  Wear clean pajamas.            11.  Place clean sheets on your bed the night of your first shower and do not  sleep with pets. Day of Surgery : Do not apply any lotions/deodorants the morning of surgery.  Please wear clean clothes to the hospital/surgery center.  FAILURE TO FOLLOW THESE INSTRUCTIONS MAY RESULT IN THE CANCELLATION OF YOUR SURGERY PATIENT SIGNATURE_________________________________  NURSE SIGNATURE__________________________________  ________________________________________________________________________   Adam Phenix  An incentive spirometer is a tool that can help keep your lungs clear and active. This tool measures how well you are filling your lungs with each breath. Taking long deep breaths may help reverse or decrease the chance of developing breathing (pulmonary) problems (especially infection) following:  A long period of time when you are unable to move or be active. BEFORE THE PROCEDURE   If the spirometer includes an indicator to show your  best effort, your nurse or respiratory therapist will set it to a desired goal.  If possible, sit up straight or lean slightly forward. Try not to slouch.  Hold the incentive spirometer in an upright position. INSTRUCTIONS FOR USE  1. Sit on the edge of your bed if possible, or sit up as far as you can in bed or on a chair. 2. Hold the incentive spirometer in an upright position. 3. Breathe out normally. 4. Place the mouthpiece in your mouth and seal your lips tightly around it. 5. Breathe in slowly and as deeply as possible, raising the piston or the ball toward the top of the column. 6. Hold your breath for 3-5 seconds or for as long as possible. Allow the piston or ball to fall to the bottom of the column. 7. Remove the mouthpiece from your mouth and breathe out normally. 8. Rest for a few seconds and repeat Steps 1 through 7 at least 10 times every 1-2 hours when you are awake. Take your time and take a few normal breaths between deep breaths. 9. The spirometer may include an indicator to show your best effort. Use the indicator as a goal to work toward during each repetition. 10. After each set of 10 deep breaths, practice coughing to be sure your lungs are clear. If you have an incision (the cut made at the time of surgery), support your incision when coughing by placing a pillow or rolled up towels firmly against it. Once you are able to get out of bed, walk around indoors and cough well. You may stop using the incentive spirometer when instructed by your caregiver.  RISKS AND COMPLICATIONS  Take your time so you do not get dizzy or light-headed.  If you are in pain, you may need to take or ask for pain medication before doing incentive spirometry. It is harder to take a deep breath if you are having pain. AFTER USE  Rest and breathe slowly and easily.  It can be helpful to keep track of a log of your  progress. Your caregiver can provide you with a simple table to help with this. If  you are using the spirometer at home, follow these instructions: [place] IF:   You are having difficultly using the spirometer.  You have trouble using the spirometer as often as instructed.  Your pain medication is not giving enough relief while using the spirometer.  You develop fever of 100.5 F (38.1 C) or higher. SEEK IMMEDIATE MEDICAL CARE IF:   You cough up bloody sputum that had not been present before.  You develop fever of 102 F (38.9 C) or greater.  You develop worsening pain at or near the incision site. MAKE SURE YOU:   Understand these instructions.  Will watch your condition.  Will get help right away if you are not doing well or get worse. Document Released: 07/21/2006 Document Revised: 06/02/2011 Document Reviewed: 09/21/2006 [facility] Patient Information 2014 [place], [place].   ________________________________________________________________________

## 2019-07-19 ENCOUNTER — Encounter (HOSPITAL_COMMUNITY)
Admission: RE | Admit: 2019-07-19 | Discharge: 2019-07-19 | Disposition: A | Discharge status: Home / Self Care | Payer: Medicare Other | Source: Ambulatory Visit | Attending: Orthopedic Surgery | Admitting: Orthopedic Surgery

## 2019-07-19 ENCOUNTER — Encounter (HOSPITAL_COMMUNITY): Payer: Self-pay

## 2019-07-19 ENCOUNTER — Other Ambulatory Visit: Payer: Self-pay

## 2019-07-19 DIAGNOSIS — Z01818 Encounter for other preprocedural examination: Secondary | ICD-10-CM | POA: Diagnosis not present

## 2019-07-19 DIAGNOSIS — R9431 Abnormal electrocardiogram [ECG] [EKG]: Secondary | ICD-10-CM | POA: Insufficient documentation

## 2019-07-19 DIAGNOSIS — I440 Atrioventricular block, first degree: Secondary | ICD-10-CM | POA: Diagnosis not present

## 2019-07-19 LAB — BASIC METABOLIC PANEL
Anion gap: 10 (ref 5–15)
BUN: 24 mg/dL — ABNORMAL HIGH (ref 8–23)
CO2: 25 mmol/L (ref 22–32)
Calcium: 9.3 mg/dL (ref 8.9–10.3)
Chloride: 100 mmol/L (ref 98–111)
Creatinine, Ser: 0.8 mg/dL (ref 0.44–1.00)
GFR calc Af Amer: >60 mL/min (ref >60)
GFR calc non Af Amer: >60 mL/min (ref >60)
Glucose, Bld: 102 mg/dL — ABNORMAL HIGH (ref 70–99)
Potassium: 4.4 mmol/L (ref 3.5–5.1)
Sodium: 135 mmol/L (ref 135–145)

## 2019-07-19 LAB — CBC
HCT: 35.6 % — ABNORMAL LOW (ref 36.0–46.0)
Hemoglobin: 11.6 g/dL — ABNORMAL LOW (ref 12.0–15.0)
MCH: 30.8 pg (ref 26.0–34.0)
MCHC: 32.6 g/dL (ref 30.0–36.0)
MCV: 94.4 fL (ref 80.0–100.0)
Platelets: 278 10*3/uL (ref 150–400)
RBC: 3.77 MIL/uL — ABNORMAL LOW (ref 3.87–5.11)
RDW: 12.3 % (ref 11.5–15.5)
WBC: 5.8 10*3/uL (ref 4.0–10.5)
nRBC: 0 % (ref 0.0–0.2)

## 2019-07-19 LAB — SURGICAL PCR SCREEN
MRSA, PCR: NEGATIVE
Staphylococcus aureus: NEGATIVE

## 2019-07-19 NOTE — Progress Notes (Signed)
PCP - Dr. Ria Bush. LOV: 06/21/19. :clearance Cardiologist -   Chest x-ray - 05/26/19 EKG -  Stress Test -  ECHO - 08/25/18. Cardiac Cath -   Sleep Study -  CPAP -   Fasting Blood Sugar -  Checks Blood Sugar _____ times a day  Blood Thinner Instructions: Aspirin Instructions: Pt. Was advised by RN to call Dr. Susie Cassette office and ask for instructions. Last Dose:  Anesthesia review:   Patient denies shortness of breath, fever, cough and chest pain at PAT appointment   Patient verbalized understanding of instructions that were given to them at the PAT appointment. Patient was also instructed that they will need to review over the PAT instructions again at home before surgery.

## 2019-07-25 ENCOUNTER — Other Ambulatory Visit (HOSPITAL_COMMUNITY)
Admission: RE | Admit: 2019-07-25 | Discharge: 2019-07-25 | Disposition: A | Discharge status: Home / Self Care | Payer: Medicare Other | Source: Ambulatory Visit | Attending: Orthopedic Surgery | Admitting: Orthopedic Surgery

## 2019-07-25 DIAGNOSIS — Z20822 Contact with and (suspected) exposure to covid-19: Secondary | ICD-10-CM | POA: Diagnosis not present

## 2019-07-25 DIAGNOSIS — Z01812 Encounter for preprocedural laboratory examination: Secondary | ICD-10-CM | POA: Insufficient documentation

## 2019-07-25 LAB — SARS CORONAVIRUS 2 (TAT 6-24 HRS): SARS Coronavirus 2: NEGATIVE

## 2019-07-26 ENCOUNTER — Ambulatory Visit: Payer: Medicare Other

## 2019-07-28 ENCOUNTER — Encounter (HOSPITAL_COMMUNITY): Payer: Self-pay | Admitting: Orthopedic Surgery

## 2019-07-28 ENCOUNTER — Encounter (HOSPITAL_COMMUNITY)
Admission: RE | Disposition: A | Discharge status: Home / Self Care | Payer: Self-pay | Source: Ambulatory Visit | Attending: Orthopedic Surgery

## 2019-07-28 ENCOUNTER — Ambulatory Visit (HOSPITAL_COMMUNITY)
Admission: RE | Admit: 2019-07-28 | Discharge: 2019-07-29 | Disposition: A | Discharge status: Home / Self Care | Payer: Medicare Other | Source: Ambulatory Visit | Attending: Orthopedic Surgery | Admitting: Orthopedic Surgery

## 2019-07-28 ENCOUNTER — Other Ambulatory Visit: Payer: Self-pay

## 2019-07-28 ENCOUNTER — Ambulatory Visit (HOSPITAL_COMMUNITY): Payer: Medicare Other | Admitting: Certified Registered"

## 2019-07-28 DIAGNOSIS — M1990 Unspecified osteoarthritis, unspecified site: Secondary | ICD-10-CM | POA: Insufficient documentation

## 2019-07-28 DIAGNOSIS — M75101 Unspecified rotator cuff tear or rupture of right shoulder, not specified as traumatic: Secondary | ICD-10-CM | POA: Diagnosis not present

## 2019-07-28 DIAGNOSIS — Z86718 Personal history of other venous thrombosis and embolism: Secondary | ICD-10-CM | POA: Insufficient documentation

## 2019-07-28 DIAGNOSIS — I10 Essential (primary) hypertension: Secondary | ICD-10-CM | POA: Insufficient documentation

## 2019-07-28 DIAGNOSIS — M8580 Other specified disorders of bone density and structure, unspecified site: Secondary | ICD-10-CM | POA: Diagnosis not present

## 2019-07-28 DIAGNOSIS — Z96611 Presence of right artificial shoulder joint: Secondary | ICD-10-CM | POA: Diagnosis present

## 2019-07-28 DIAGNOSIS — M25811 Other specified joint disorders, right shoulder: Secondary | ICD-10-CM | POA: Diagnosis not present

## 2019-07-28 DIAGNOSIS — Z79899 Other long term (current) drug therapy: Secondary | ICD-10-CM | POA: Insufficient documentation

## 2019-07-28 DIAGNOSIS — Z7982 Long term (current) use of aspirin: Secondary | ICD-10-CM | POA: Diagnosis not present

## 2019-07-28 DIAGNOSIS — G8918 Other acute postprocedural pain: Secondary | ICD-10-CM | POA: Diagnosis not present

## 2019-07-28 DIAGNOSIS — M12811 Other specific arthropathies, not elsewhere classified, right shoulder: Secondary | ICD-10-CM | POA: Diagnosis not present

## 2019-07-28 HISTORY — PX: REVERSE SHOULDER ARTHROPLASTY: SHX5054

## 2019-07-28 SURGERY — ARTHROPLASTY, SHOULDER, TOTAL, REVERSE
Anesthesia: General | Site: Shoulder | Laterality: Right

## 2019-07-28 MED ORDER — 0.9 % SODIUM CHLORIDE (POUR BTL) OPTIME
TOPICAL | Status: DC | PRN
  Administered 2019-07-28: 13:00:00 1000 mL

## 2019-07-28 MED ORDER — ROCURONIUM BROMIDE 10 MG/ML (PF) SYRINGE
PREFILLED_SYRINGE | INTRAVENOUS | Status: AC
  Filled 2019-07-28: qty 10

## 2019-07-28 MED ORDER — ONDANSETRON HCL 4 MG/2ML IJ SOLN
INTRAMUSCULAR | Status: AC
  Filled 2019-07-28: qty 2

## 2019-07-28 MED ORDER — METHOCARBAMOL 500 MG PO TABS: 500.0000 mg | ORAL_TABLET | Freq: Four times a day (QID) | ORAL | Status: DC | PRN

## 2019-07-28 MED ORDER — ALUM & MAG HYDROXIDE-SIMETH 200-200-20 MG/5ML PO SUSP: 30.0000 mL | ORAL | Status: DC | PRN

## 2019-07-28 MED ORDER — OXYCODONE HCL 5 MG PO TABS: 10.0000 mg | ORAL_TABLET | ORAL | Status: DC | PRN

## 2019-07-28 MED ORDER — PANTOPRAZOLE SODIUM 40 MG PO TBEC
40.0000 mg | DELAYED_RELEASE_TABLET | Freq: Every day | ORAL | Status: DC
  Administered 2019-07-28 – 2019-07-29 (×2): 40 mg via ORAL
  Filled 2019-07-28 (×2): qty 1

## 2019-07-28 MED ORDER — ATENOLOL 25 MG PO TABS
25.0000 mg | ORAL_TABLET | Freq: Once | ORAL | Status: AC
  Administered 2019-07-28: 11:00:00 25 mg via ORAL
  Filled 2019-07-28: qty 1

## 2019-07-28 MED ORDER — TRANEXAMIC ACID-NACL 1000-0.7 MG/100ML-% IV SOLN
1000.0000 mg | INTRAVENOUS | Status: AC
  Administered 2019-07-28: 1000 mg via INTRAVENOUS
  Filled 2019-07-28: qty 100

## 2019-07-28 MED ORDER — BISACODYL 5 MG PO TBEC: 5.0000 mg | DELAYED_RELEASE_TABLET | Freq: Every day | ORAL | Status: DC | PRN

## 2019-07-28 MED ORDER — MEPERIDINE HCL 50 MG/ML IJ SOLN: 6.2500 mg | INTRAMUSCULAR | Status: DC | PRN

## 2019-07-28 MED ORDER — HYDROMORPHONE HCL 1 MG/ML IJ SOLN: 0.5000 mg | INTRAMUSCULAR | Status: DC | PRN

## 2019-07-28 MED ORDER — METOCLOPRAMIDE HCL 5 MG/ML IJ SOLN: 5.0000 mg | Freq: Three times a day (TID) | INTRAMUSCULAR | Status: DC | PRN

## 2019-07-28 MED ORDER — ONDANSETRON HCL 4 MG/2ML IJ SOLN: 4.0000 mg | Freq: Four times a day (QID) | INTRAMUSCULAR | Status: DC | PRN

## 2019-07-28 MED ORDER — OXYCODONE HCL 5 MG PO TABS: 5.0000 mg | ORAL_TABLET | ORAL | Status: DC | PRN

## 2019-07-28 MED ORDER — ACETAMINOPHEN 10 MG/ML IV SOLN
1000.0000 mg | Freq: Once | INTRAVENOUS | Status: DC | PRN
  Administered 2019-07-28: 1000 mg via INTRAVENOUS

## 2019-07-28 MED ORDER — CEFAZOLIN SODIUM-DEXTROSE 2-4 GM/100ML-% IV SOLN
2.0000 g | INTRAVENOUS | Status: AC
  Administered 2019-07-28: 2 g via INTRAVENOUS
  Filled 2019-07-28: qty 100

## 2019-07-28 MED ORDER — PHENYLEPHRINE 40 MCG/ML (10ML) SYRINGE FOR IV PUSH (FOR BLOOD PRESSURE SUPPORT)
PREFILLED_SYRINGE | INTRAVENOUS | Status: AC
  Filled 2019-07-28: qty 10

## 2019-07-28 MED ORDER — KETOROLAC TROMETHAMINE 15 MG/ML IJ SOLN
7.5000 mg | Freq: Four times a day (QID) | INTRAMUSCULAR | Status: DC
  Administered 2019-07-28 – 2019-07-29 (×3): 7.5 mg via INTRAVENOUS
  Filled 2019-07-28 (×3): qty 1

## 2019-07-28 MED ORDER — ONDANSETRON HCL 4 MG PO TABS: 4.0000 mg | ORAL_TABLET | Freq: Four times a day (QID) | ORAL | Status: DC | PRN

## 2019-07-28 MED ORDER — NITROGLYCERIN 0.4 MG SL SUBL: 0.4000 mg | SUBLINGUAL_TABLET | SUBLINGUAL | Status: DC | PRN

## 2019-07-28 MED ORDER — HYDRALAZINE HCL 25 MG PO TABS: 25.0000 mg | ORAL_TABLET | Freq: Two times a day (BID) | ORAL | Status: DC | PRN

## 2019-07-28 MED ORDER — PHENYLEPHRINE HCL-NACL 10-0.9 MG/250ML-% IV SOLN
INTRAVENOUS | Status: DC | PRN
  Administered 2019-07-28: 20 ug/min via INTRAVENOUS

## 2019-07-28 MED ORDER — PHENYLEPHRINE HCL (PRESSORS) 10 MG/ML IV SOLN
INTRAVENOUS | Status: AC
  Filled 2019-07-28: qty 1

## 2019-07-28 MED ORDER — AMLODIPINE BESYLATE 5 MG PO TABS
5.0000 mg | ORAL_TABLET | Freq: Every day | ORAL | Status: DC
  Administered 2019-07-29: 5 mg via ORAL
  Filled 2019-07-28: qty 1

## 2019-07-28 MED ORDER — ROCURONIUM BROMIDE 10 MG/ML (PF) SYRINGE
PREFILLED_SYRINGE | INTRAVENOUS | Status: DC | PRN
  Administered 2019-07-28: 50 mg via INTRAVENOUS

## 2019-07-28 MED ORDER — ONDANSETRON HCL 4 MG/2ML IJ SOLN
INTRAMUSCULAR | Status: DC | PRN
  Administered 2019-07-28: 4 mg via INTRAVENOUS

## 2019-07-28 MED ORDER — METHOCARBAMOL 500 MG IVPB - SIMPLE MED
INTRAVENOUS | Status: AC
  Administered 2019-07-28: 500 mg
  Filled 2019-07-28: qty 50

## 2019-07-28 MED ORDER — METHOCARBAMOL 500 MG IVPB - SIMPLE MED
500.0000 mg | Freq: Four times a day (QID) | INTRAVENOUS | Status: DC | PRN
  Filled 2019-07-28: qty 50

## 2019-07-28 MED ORDER — ACETAMINOPHEN 10 MG/ML IV SOLN
INTRAVENOUS | Status: AC
  Filled 2019-07-28: qty 100

## 2019-07-28 MED ORDER — LIDOCAINE 2% (20 MG/ML) 5 ML SYRINGE
INTRAMUSCULAR | Status: DC | PRN
  Administered 2019-07-28: 40 mg via INTRAVENOUS

## 2019-07-28 MED ORDER — LACTATED RINGERS IV SOLN: INTRAVENOUS | Status: DC

## 2019-07-28 MED ORDER — FENTANYL CITRATE (PF) 100 MCG/2ML IJ SOLN
50.0000 ug | INTRAMUSCULAR | Status: AC
  Administered 2019-07-28: 100 ug via INTRAVENOUS
  Filled 2019-07-28: qty 2

## 2019-07-28 MED ORDER — BUPIVACAINE-EPINEPHRINE (PF) 0.5% -1:200000 IJ SOLN
INTRAMUSCULAR | Status: DC | PRN
  Administered 2019-07-28 (×5): 3 mL via PERINEURAL

## 2019-07-28 MED ORDER — PHENOL 1.4 % MT LIQD: 1.0000 | OROMUCOSAL | Status: DC | PRN

## 2019-07-28 MED ORDER — STERILE WATER FOR IRRIGATION IR SOLN
Status: DC | PRN
  Administered 2019-07-28: 2000 mL

## 2019-07-28 MED ORDER — DEXAMETHASONE SODIUM PHOSPHATE 10 MG/ML IJ SOLN
INTRAMUSCULAR | Status: DC | PRN
  Administered 2019-07-28: 4 mg via INTRAVENOUS

## 2019-07-28 MED ORDER — MENTHOL 3 MG MT LOZG: 1.0000 | LOZENGE | OROMUCOSAL | Status: DC | PRN

## 2019-07-28 MED ORDER — METOCLOPRAMIDE HCL 5 MG PO TABS: 5.0000 mg | ORAL_TABLET | Freq: Three times a day (TID) | ORAL | Status: DC | PRN

## 2019-07-28 MED ORDER — DOCUSATE SODIUM 100 MG PO CAPS
100.0000 mg | ORAL_CAPSULE | Freq: Two times a day (BID) | ORAL | Status: DC
  Administered 2019-07-28 – 2019-07-29 (×2): 100 mg via ORAL
  Filled 2019-07-28 (×2): qty 1

## 2019-07-28 MED ORDER — PROPOFOL 10 MG/ML IV BOLUS
INTRAVENOUS | Status: DC | PRN
  Administered 2019-07-28: 90 mg via INTRAVENOUS

## 2019-07-28 MED ORDER — PHENYLEPHRINE 40 MCG/ML (10ML) SYRINGE FOR IV PUSH (FOR BLOOD PRESSURE SUPPORT)
PREFILLED_SYRINGE | INTRAVENOUS | Status: DC | PRN
  Administered 2019-07-28 (×2): 40 ug via INTRAVENOUS

## 2019-07-28 MED ORDER — PROPOFOL 10 MG/ML IV BOLUS
INTRAVENOUS | Status: AC
  Filled 2019-07-28: qty 20

## 2019-07-28 MED ORDER — FENTANYL CITRATE (PF) 100 MCG/2ML IJ SOLN
INTRAMUSCULAR | Status: AC
  Filled 2019-07-28: qty 2

## 2019-07-28 MED ORDER — BUPIVACAINE LIPOSOME 1.3 % IJ SUSP
INTRAMUSCULAR | Status: DC | PRN
  Administered 2019-07-28 (×5): 2 mL via PERINEURAL

## 2019-07-28 MED ORDER — ACETAMINOPHEN 325 MG PO TABS: 325.0000 mg | ORAL_TABLET | Freq: Four times a day (QID) | ORAL | Status: DC | PRN

## 2019-07-28 MED ORDER — MAGNESIUM CITRATE PO SOLN: 1.0000 | Freq: Once | ORAL | Status: DC | PRN

## 2019-07-28 MED ORDER — ONDANSETRON HCL 4 MG/2ML IJ SOLN: 4.0000 mg | Freq: Once | INTRAMUSCULAR | Status: DC | PRN

## 2019-07-28 MED ORDER — ATENOLOL 25 MG PO TABS
25.0000 mg | ORAL_TABLET | Freq: Two times a day (BID) | ORAL | Status: DC
  Administered 2019-07-28 – 2019-07-29 (×2): 25 mg via ORAL
  Filled 2019-07-28 (×2): qty 1

## 2019-07-28 MED ORDER — DEXAMETHASONE SODIUM PHOSPHATE 10 MG/ML IJ SOLN
INTRAMUSCULAR | Status: AC
  Filled 2019-07-28: qty 1

## 2019-07-28 MED ORDER — HYDROCHLOROTHIAZIDE 25 MG PO TABS
12.5000 mg | ORAL_TABLET | Freq: Every day | ORAL | Status: DC
  Administered 2019-07-28 – 2019-07-29 (×2): 12.5 mg via ORAL
  Filled 2019-07-28 (×2): qty 1

## 2019-07-28 MED ORDER — FENTANYL CITRATE (PF) 100 MCG/2ML IJ SOLN
25.0000 ug | INTRAMUSCULAR | Status: DC | PRN
  Administered 2019-07-28 (×2): 50 ug via INTRAVENOUS

## 2019-07-28 MED ORDER — ASPIRIN EC 81 MG PO TBEC
81.0000 mg | DELAYED_RELEASE_TABLET | Freq: Every day | ORAL | Status: DC
  Administered 2019-07-28 – 2019-07-29 (×2): 81 mg via ORAL
  Filled 2019-07-28 (×2): qty 1

## 2019-07-28 MED ORDER — DIPHENHYDRAMINE HCL 12.5 MG/5ML PO ELIX: 12.5000 mg | ORAL_SOLUTION | ORAL | Status: DC | PRN

## 2019-07-28 MED ORDER — LIDOCAINE 2% (20 MG/ML) 5 ML SYRINGE
INTRAMUSCULAR | Status: AC
  Filled 2019-07-28: qty 5

## 2019-07-28 MED ORDER — POLYETHYLENE GLYCOL 3350 17 G PO PACK: 17.0000 g | PACK | Freq: Every day | ORAL | Status: DC | PRN

## 2019-07-28 MED ORDER — SUGAMMADEX SODIUM 200 MG/2ML IV SOLN
INTRAVENOUS | Status: DC | PRN
  Administered 2019-07-28: 140 mg via INTRAVENOUS

## 2019-07-28 SURGICAL SUPPLY — 65 items
BAG ZIPLOCK 12X15 (MISCELLANEOUS) ×3 IMPLANT
BLADE SAW SGTL 83.5X18.5 (BLADE) ×3 IMPLANT
COOLER ICEMAN CLASSIC (MISCELLANEOUS) IMPLANT
COVER BACK TABLE 60X90IN (DRAPES) ×3 IMPLANT
COVER SURGICAL LIGHT HANDLE (MISCELLANEOUS) ×3 IMPLANT
COVER WAND RF STERILE (DRAPES) IMPLANT
CUP SUT UNIV REVERS 36 NEUTRAL (Cup) ×3 IMPLANT
DERMABOND ADVANCED (GAUZE/BANDAGES/DRESSINGS) ×2
DERMABOND ADVANCED .7 DNX12 (GAUZE/BANDAGES/DRESSINGS) ×1 IMPLANT
DRAPE INCISE IOBAN 66X45 STRL (DRAPES) IMPLANT
DRAPE ORTHO SPLIT 77X108 STRL (DRAPES) ×6
DRAPE SHEET LG 3/4 BI-LAMINATE (DRAPES) ×3 IMPLANT
DRAPE SURG 17X11 SM STRL (DRAPES) ×3 IMPLANT
DRAPE SURG ORHT 6 SPLT 77X108 (DRAPES) ×2 IMPLANT
DRAPE U-SHAPE 47X51 STRL (DRAPES) ×3 IMPLANT
DRSG AQUACEL AG ADV 3.5X 6 (GAUZE/BANDAGES/DRESSINGS) ×3 IMPLANT
DRSG AQUACEL AG ADV 3.5X10 (GAUZE/BANDAGES/DRESSINGS) ×3 IMPLANT
DURAPREP 26ML APPLICATOR (WOUND CARE) ×3 IMPLANT
ELECT BLADE TIP CTD 4 INCH (ELECTRODE) ×3 IMPLANT
ELECT REM PT RETURN 15FT ADLT (MISCELLANEOUS) ×3 IMPLANT
FACESHIELD WRAPAROUND (MASK) ×12 IMPLANT
GLENOID UNI REV MOD 24 +2 LAT (Joint) ×3 IMPLANT
GLENOSPHERE 36 +4 LAT/24 (Joint) ×3 IMPLANT
GLOVE BIO SURGEON STRL SZ7.5 (GLOVE) ×3 IMPLANT
GLOVE BIO SURGEON STRL SZ8 (GLOVE) ×3 IMPLANT
GLOVE SS BIOGEL STRL SZ 7 (GLOVE) ×1 IMPLANT
GLOVE SS BIOGEL STRL SZ 7.5 (GLOVE) ×1 IMPLANT
GLOVE SUPERSENSE BIOGEL SZ 7 (GLOVE) ×2
GLOVE SUPERSENSE BIOGEL SZ 7.5 (GLOVE) ×2
GLOVE SURG SYN 7.0 (GLOVE) IMPLANT
GLOVE SURG SYN 7.5  E (GLOVE)
GLOVE SURG SYN 7.5 E (GLOVE) IMPLANT
GLOVE SURG SYN 8.0 (GLOVE) IMPLANT
GOWN STRL REUS W/TWL LRG LVL3 (GOWN DISPOSABLE) ×6 IMPLANT
INSERT HUMERAL 36 +6 (Shoulder) ×3 IMPLANT
KIT BASIN (CUSTOM PROCEDURE TRAY) ×3 IMPLANT
KIT TURNOVER KIT A (KITS) IMPLANT
MANIFOLD NEPTUNE II (INSTRUMENTS) ×3 IMPLANT
NEEDLE TAPERED W/ NITINOL LOOP (MISCELLANEOUS) ×3 IMPLANT
NS IRRIG 1000ML POUR BTL (IV SOLUTION) ×3 IMPLANT
PACK SHOULDER (CUSTOM PROCEDURE TRAY) ×3 IMPLANT
PAD ARMBOARD 7.5X6 YLW CONV (MISCELLANEOUS) ×3 IMPLANT
PAD COLD SHLDR WRAP-ON (PAD) ×3 IMPLANT
RESTRAINT HEAD UNIVERSAL NS (MISCELLANEOUS) ×3 IMPLANT
SCREW CENTRAL MOD 30MM (Screw) ×3 IMPLANT
SCREW PERI LOCK 5.5X16 (Screw) ×6 IMPLANT
SCREW PERI LOCK 5.5X24 (Screw) ×3 IMPLANT
SCREW PERIPHERAL 5.5X28 LOCK (Screw) ×3 IMPLANT
SLING ARM FOAM STRAP LRG (SOFTGOODS) ×6 IMPLANT
SLING ARM FOAM STRAP MED (SOFTGOODS) IMPLANT
SPONGE LAP 18X18 RF (DISPOSABLE) IMPLANT
STEM HUMERAL UNIVER REV SIZE 7 (Stem) ×3 IMPLANT
SUCTION FRAZIER HANDLE 12FR (TUBING) ×3
SUCTION TUBE FRAZIER 12FR DISP (TUBING) ×1 IMPLANT
SUT FIBERWIRE #2 38 T-5 BLUE (SUTURE)
SUT MNCRL AB 3-0 PS2 18 (SUTURE) ×3 IMPLANT
SUT MON AB 2-0 CT1 36 (SUTURE) ×3 IMPLANT
SUT VIC AB 1 CT1 36 (SUTURE) ×3 IMPLANT
SUTURE FIBERWR #2 38 T-5 BLUE (SUTURE) IMPLANT
SUTURE TAPE 1.3 40 TPR END (SUTURE) ×2 IMPLANT
SUTURETAPE 1.3 40 TPR END (SUTURE) ×6
TOWEL OR 17X26 10 PK STRL BLUE (TOWEL DISPOSABLE) ×3 IMPLANT
TOWEL OR NON WOVEN STRL DISP B (DISPOSABLE) ×3 IMPLANT
WATER STERILE IRR 1000ML POUR (IV SOLUTION) ×6 IMPLANT
YANKAUER SUCT BULB TIP 10FT TU (MISCELLANEOUS) ×3 IMPLANT

## 2019-07-28 NOTE — H&P (Signed)
Priscilla [name]    Chief Complaint: Right shoulder rotator cuff tear arthropathy HPI: The patient is a 86 y.o. female with chronic and progressively increasing right shoulder pain related to advanced rotator cuff tear arthropathy.  She has had recent acutely increased pain and significantly decreased functional capabilities due to her right shoulder.  She is brought to the operating this time for planned right shoulder reverse arthroplasty.    Past Medical History:  Diagnosis Date  . Aortic insufficiency    mild to moderate on echo 2017  . B12 deficiency 03/25/2014   H/o small bowel obstruction with resection as well as colorectal cancer s/p partial colectomy.  Receives B12 injections   . Cancer ([place]) 2001, 2008   colon, rectal  . Chest pain, atypical    no ischemia on nuclear stress test 2017  . History of chicken pox   . History of Clostridium difficile 2012  . History of DVT (deep vein thrombosis) 2002   right leg, after chemo for first cancer, treated  . Hypertension   . IDA (iron deficiency anemia)   . Open wound of abdominal wall with complication   . Osteoarthritis   . Osteopenia 09/2011   DEXA 2013 spine -2.2, femur -1.6, DEXA 12/2015 - hip T score -1.9  . SBO (small bowel obstruction) ([place]) 02/05/2011   On 02/05/11 she had enterolysis and small bowel resection     Past Surgical History:  Procedure Laterality Date  . ABDOMINAL HYSTERECTOMY  1984  . APPENDECTOMY  1984  . BOWEL RESECTION  02/05/2011   Procedure: SMALL BOWEL RESECTION;  Surgeon: Harl Bowie, MD;  Location: [facility];  Service: General;  Laterality: N/A;  . broken nose    . CARDIOVASCULAR STRESS TEST  11/2015   WNL ([place])  . COLECTOMY    . COLON SURGERY  2001 and 2008  . COLONOSCOPY  12/2012   erosions at colonic anastomosis, o/w normal rpt 5 yrs (Dr. Penelope Coop at [facility])  . COLONOSCOPY  01/2018   WNL no rpt recommended ([place])  . COLOSTOMY    . DEXA  09/2011   T-score: spine -2.2, femur -1.6  .  ESOPHAGOGASTRODUODENOSCOPY  01/31/2011   Procedure: ESOPHAGOGASTRODUODENOSCOPY (EGD);  Surgeon: Missy Sabins, MD;  Location[facility] ENDOSCOPY;  Service: Endoscopy;  Laterality: N/A;  duodenal bx;s r/o giardia, r/o celiac diease  . EYE SURGERY  2018   cataract surgery  . EYE SURGERY  2019   laser surgery  . FLEXIBLE SIGMOIDOSCOPY  01/31/2011   Procedure: FLEXIBLE SIGMOIDOSCOPY;  Surgeon: Missy Sabins, MD;  Location: [place];  Service: Endoscopy;  Laterality: N/A;  . ILEOSTOMY    . JOINT REPLACEMENT  2010   knee   . LAPAROTOMY  02/05/2011   Procedure: EXPLORATORY LAPAROTOMY;  Surgeon: Harl Bowie, MD;  Location: [place];  Service: General;  Laterality: N/A;  lysis of adhesions.  . NUCLEAR STRESS TEST  11/17/08   NORMAL  . REPLACEMENT TOTAL KNEE  2010   RIGHT KNEE  . WOUND DEBRIDEMENT N/A 05/21/2012   Procedure: Excision non healing ABDOMINAL WOUND;  Surgeon: Harl Bowie, MD;  Location: [facility] OR;  Service: General;  Laterality: N/A;    Family History  Problem Relation Age of Onset  . Heart disease Mother        valve problems  . Heart attack Mother   . Congestive Heart Failure Mother   . Heart disease Father        due to lupus  .  Lupus Father   . Heart attack Father   . Colon cancer Paternal Grandmother   . Coronary artery disease Brother        s/p CABG  . Coronary artery disease Sister        s/p CABG  . Heart attack Brother   . Heart attack Sister   . Diabetes Neg Hx   . Stroke Neg Hx   . Hypertension Neg Hx     Social History:  reports that she has never smoked. She has never used smokeless tobacco. She reports that she does not drink alcohol or use drugs.   Facility-Administered Medications Prior to Admission  Medication Dose Route Frequency Provider Last Rate Last Admin  . cyanocobalamin ((VITAMIN B-12)) injection 1,000 mcg  1,000 mcg Intramuscular Once Ria Bush, MD       Medications Prior to Admission  Medication Sig Dispense Refill  .  acetaminophen (TYLENOL) 500 MG tablet Take 500-1,000 mg by mouth every 6 (six) hours as needed for moderate pain or headache.    Marland Kitchen amLODipine (NORVASC) 5 MG tablet Take 1 tablet (5 mg total) by mouth daily. 90 tablet 3  . aspirin EC 81 MG tablet Take 81 mg by mouth daily.    Marland Kitchen atenolol (TENORMIN) 25 MG tablet Take 1 tablet (25 mg total) by mouth 2 (two) times daily. 180 tablet 1  . Biotin (BIOTIN 5000) 5 MG CAPS Take 5 mg by mouth at bedtime.     . Calcium Carb-Cholecalciferol (CALCIUM 600 + D PO) Take 1 tablet by mouth at bedtime.    . calcium carbonate (TUMS - DOSED IN MG ELEMENTAL CALCIUM) 500 MG chewable tablet Chew 1 tablet by mouth 2 (two) times daily as needed for indigestion or heartburn.    . cyanocobalamin (,VITAMIN B-12,) 1000 MCG/ML injection Inject 1,000 mcg into the muscle every 30 (thirty) days.  1 mL 0  . D3-50 1.25 MG (50000 UT) capsule TAKE 1 CAPSULE BY MOUTH ONE TIME PER WEEK (Patient taking differently: Take 50,000 Units by mouth every Sunday. ) 12 capsule 1  . ferrous sulfate 325 (65 FE) MG tablet Take 325 mg by mouth daily with breakfast.    . hydrALAZINE (APRESOLINE) 25 MG tablet TAKE 1 TABLET BY MOUTH TWICE A DAY AS NEEDED (BP>160/100) (Patient taking differently: Take 25 mg by mouth 2 (two) times daily as needed ((BP>160/100)). ) 30 tablet 0  . hydrochlorothiazide (HYDRODIURIL) 12.5 MG tablet Take 1 tablet (12.5 mg total) by mouth daily. 90 tablet 3  . nitroGLYCERIN (NITROSTAT) 0.4 MG SL tablet Place 1 tablet (0.4 mg total) under the tongue every 5 (five) minutes as needed for chest pain. 25 tablet 1     Physical Exam: Right shoulder demonstrates severely painful and guarded motion.  Global weakness to manual muscle testing.  Plain radiographs show the joint to be congruently aligned.  The MRI scan confirms a high riding humeral head with a severely retracted and chronically atrophied rotator cuff  Vitals  Temp:  [97.8 F (36.6 C)] 97.8 F (36.6 C) (05/06 1028) Pulse  Rate:  [72] 72 (05/06 1028) Resp:  [12] 12 (05/06 1028) BP: (174)/(61) 174/61 (05/06 1028) SpO2:  [100 %] 100 % (05/06 1028) Weight:  [69.2 kg] 69.2 kg (05/06 1047)  Assessment/Plan  Impression: Right shoulder rotator cuff tear arthropathy  Plan of Action: Procedure(s): REVERSE SHOULDER ARTHROPLASTY  Priscilla [name] M Nicle Connole 07/28/2019, 11:25 AM Contact # [phone]

## 2019-07-28 NOTE — Transfer of Care (Signed)
Immediate Anesthesia Transfer of Care Note  Patient: Priscilla [name]  Procedure(s) Performed: REVERSE SHOULDER ARTHROPLASTY (Right Shoulder)  Patient Location: PACU  Anesthesia Type:General  Level of Consciousness: awake, alert  and oriented  Airway & Oxygen Therapy: Patient Spontanous Breathing and Patient connected to face mask oxygen  Post-op Assessment: Report given to RN and Post -op Vital signs reviewed and stable  Post vital signs: Reviewed and stable  Last Vitals:  Vitals Value Taken Time  BP 171/65 07/28/19 1355  Temp    Pulse 80 07/28/19 1358  Resp 26 07/28/19 1358  SpO2 100 % 07/28/19 1358  Vitals shown include unvalidated device data.  Last Pain:  Vitals:   07/28/19 1047  TempSrc:   PainSc: 3       Patients Stated Pain Goal: 2 ([id number])  Complications: No apparent anesthesia complications

## 2019-07-28 NOTE — Op Note (Signed)
07/28/2019  1:42 PM  PATIENT:   Priscilla [name]  86 y.o. female  PRE-OPERATIVE DIAGNOSIS:  Right shoulder rotator cuff tear arthropathy  POST-OPERATIVE DIAGNOSIS: Same  PROCEDURE: Right shoulder reverse arthroplasty utilizing a press-fit size 7 Arthrex stem with a +6 polyethylene insert, neutral metaphysis, 36/+4 glenosphere on a small/+2 baseplate.  SURGEON:  Marin Shutter M.D.  ASSISTANTS: Jenetta Loges, PA-C  ANESTHESIA:   General endotracheal and interscalene block with Exparel  EBL: 200 cc  SPECIMEN: None  Drains: None   PATIENT DISPOSITION:  PACU - hemodynamically stable.    PLAN OF CARE: Admit for overnight observation  Brief history:  Ms. Fockler is an 86 year old female who has had chronic and progressively increasing right shoulder pain related to advanced rotator cuff tear arthropathy.  She had a recent additional injury to the right shoulder with acutely increased pain and significant deterioration in her functional capabilities which has been refractory to prolonged attempts at conservative management.  Due to her increasing pain and functional mentation she is brought to the operating this time for planned right shoulder reverse arthroplasty.  Preoperatively, I counseled the patient regarding treatment options and risks versus benefits thereof.  Possible surgical complications were all reviewed including potential for bleeding, infection, neurovascular injury, persistent pain, loss of motion, anesthetic complication, failure of the implant, and possible need for additional surgery. They understand and accept and agrees with our planned procedure.   Procedure in detail:  After undergoing routine preop evaluation patient received prophylactic antibiotics and interscalene block with Exparel was established in the holding area by the anesthesia department.  Patient subsequently placed supine on the op table and underwent the smooth induction of a general  endotracheal anesthesia.  Placed into the beachchair position and appropriately padded and protected.  The right shoulder girdle region was sterilely prepped and draped in standard fashion.  Timeout was called.  An anterior deltopectoral approach to the right shoulder was made through a 10 cm incision.  Skin flaps were elevated dissection carried deeply and the deltopectoral interval was developed from proximal to distal with pain taken laterally.  The conjoined tendon was mobilized and retracted medially and the upper centimeter of the pectoralis major was tenotomized for exposure.  The long head biceps tendon was then unroofed and tenodesed at the upper border the pectoralis major tendon with the proximal segment unroofed and excised.  The rotator cuff was split along the rotator interval and then divided from the lesser tuberosity with electrocautery and the free margin was tagged.  Capsular attachments were then divided on the anterior and infra margins of the humeral neck.  Humeral head was then delivered through the wound we outlined the proposed humeral head resection using the extra medullary guide and the head was resected using an oscillating saw.  A metal cap was placed over the cut proximal humeral surface and the glenoid was then exposed and a circumferential labral resection was completed.  A guidepin was then directed into the center of the glenoid with an approximate 10 degree inferior tilt and then was then reamed with our central followed by peripheral reamer and all areas were then copiously irrigated and cleaned.  The glenoid was then terminally prepared with the central drill and tapped 30 mm lag screw applied to the baseplate which was then inserted achieving excellent fit and fixation.  The peripheral locking screws were all then placed with excellent fixation.  A 36/+4 glenosphere was then impacted onto the baseplate and the central locking  screw was placed.  We then returned our attention to  the proximal humerus where the canal was opened and we broached up to a size 7 at the native retroversion of approximate 30 degrees.  A neutral metaphyseal reamer was then used.  A trial reduction showed good soft tissue balance good fit and good stability.  The trial was then removed our final implant was assembled and then impacted with excellent fit and fixation.  We then performed a series of trial reductions and ultimately found that +6 give [place] the best soft tissue balance.  A final +6 polywas then impacted into the metaphysis and a final reduction was then performed.  The overall motion and stability was much to our satisfaction.  Good soft tissue balance.  The subscapularis was then confirmed to have good elasticity and so was repaired back to the eyelets on the collar of her implant.  Final irrigation was completed.  Hemostasis was obtained.  The deltopectoral interval was closed with a series of figure-of-eight and 1 Vicryl sutures.  2-0 Vicryl used for the subcu layer and intracuticular 3-0 Monocryl for the skin followed by Dermabond and Aquacel dressing the right arm was then placed in a sling the patient was awakened, extubated, and taken to the recovery room in stable condition.  Jenetta Loges, PA-C was utilized as an Environmental consultant throughout this case, essential for help with positioning the patient, positioning extremity, tissue manipulation, implantation of the prosthesis, suture management, wound closure, and intraoperative decision-making.  Marin Shutter MD   Contact # [phone]

## 2019-07-28 NOTE — Progress Notes (Signed)
Assisted Dr. Hatchett with right, ultrasound guided, interscalene  block. Side rails up, monitors on throughout procedure. See vital signs in flow sheet. Tolerated Procedure well.  

## 2019-07-28 NOTE — Anesthesia Postprocedure Evaluation (Signed)
Anesthesia Post Note  Patient: Priscilla [name]  Procedure(s) Performed: REVERSE SHOULDER ARTHROPLASTY (Right Shoulder)     Patient location during evaluation: PACU Anesthesia Type: General Level of consciousness: sedated Pain management: pain level controlled Vital Signs Assessment: post-procedure vital signs reviewed and stable Respiratory status: nonlabored ventilation Cardiovascular status: stable Postop Assessment: no apparent nausea or vomiting Anesthetic complications: no    Last Vitals:  Vitals:   07/28/19 1148 07/28/19 1355  BP:  (!) 171/65  Pulse: 92 80  Resp: (!) 21 18  Temp:  (!) 36.4 C  SpO2: 100% 100%    Last Pain:  Vitals:   07/28/19 1430  TempSrc:   PainSc: 4    Pain Goal: Patients Stated Pain Goal: 2 (07/28/19 1047)                 Huston Foley

## 2019-07-28 NOTE — Anesthesia Preprocedure Evaluation (Signed)
Anesthesia Evaluation  Patient identified by MRN, date of birth, ID band Patient awake    Reviewed: Allergy & Precautions, NPO status , Patient's Chart, lab work & pertinent test results, reviewed documented beta blocker date and time   Airway Mallampati: I       Dental no notable dental hx. (+) Teeth Intact   Pulmonary neg pulmonary ROS,    Pulmonary exam normal breath sounds clear to auscultation       Cardiovascular hypertension, Pt. on medications and Pt. on home beta blockers Normal cardiovascular exam Rhythm:Regular Rate:Normal     Neuro/Psych negative neurological ROS  negative psych ROS   GI/Hepatic negative GI ROS, Neg liver ROS,   Endo/Other    Renal/GU negative Renal ROS  negative genitourinary   Musculoskeletal  (+) Arthritis , Osteoarthritis,    Abdominal Normal abdominal exam  (+)   Peds  Hematology   Anesthesia Other Findings   Reproductive/Obstetrics                             Anesthesia Physical Anesthesia Plan  ASA: II  Anesthesia Plan: General   Post-op Pain Management:  Regional for Post-op pain   Induction: Intravenous  PONV Risk Score and Plan: 3 and Ondansetron and Treatment may vary due to age or medical condition  Airway Management Planned: Oral ETT  Additional Equipment: None  Intra-op Plan:   Post-operative Plan: Extubation in OR  Informed Consent: I have reviewed the patients History and Physical, chart, labs and discussed the procedure including the risks, benefits and alternatives for the proposed anesthesia with the patient or authorized representative who has indicated his/her understanding and acceptance.     Dental advisory given  Plan Discussed with: CRNA  Anesthesia Plan Comments:         Anesthesia Quick Evaluation

## 2019-07-28 NOTE — Anesthesia Procedure Notes (Signed)
Anesthesia Regional Block: Interscalene brachial plexus block   Pre-Anesthetic Checklist: ,, timeout performed, Correct Patient, Correct Site, Correct Laterality, Correct Procedure, Correct Position, site marked, Risks and benefits discussed,  Surgical consent,  Pre-op evaluation,  At surgeon's request and post-op pain management  Laterality: Right and Upper  Prep: chloraprep       Needles:  Injection technique: Single-shot  Needle Type: Echogenic Stimulator Needle     Needle Length: 10cm  Needle Gauge: 21   Needle insertion depth: 1 cm   Additional Needles:   Procedures:,,,, ultrasound used (permanent image in chart),,,,  Narrative:  Start time: 07/28/2019 11:40 AM End time: 07/28/2019 11:50 AM Injection made incrementally with aspirations every 5 mL.  Performed by: Personally  Anesthesiologist: Lyn Hollingshead, MD

## 2019-07-28 NOTE — Discharge Instructions (Signed)
 Kevin M. Supple, M.D., F.A.A.O.S. Orthopaedic Surgery Specializing in Arthroscopic and Reconstructive Surgery of the Shoulder [phone] [address] - [place], [place] [address] - Fax [phone]   POST-OP TOTAL SHOULDER REPLACEMENT INSTRUCTIONS  1. Follow up in the office for your first post-op appointment 10-14 days from the date of your surgery. If you do not already have a scheduled appointment, our office will contact you to schedule.  2. The bandage over your incision is waterproof. You may begin showering with this dressing on. You may leave this dressing on until first follow up appointment within 2 weeks. We prefer you leave this dressing in place until follow up however after 5-7 days if you are having itching or skin irritation and would like to remove it you may do so. Go slow and tug at the borders gently to break the bond the dressing has with the skin. At this point if there is no drainage it is okay to go without a bandage or you may cover it with a light guaze and tape. You can also expect significant bruising around your shoulder that will drift down your arm and into your chest wall. This is very normal and should resolve over several days.   3. Wear your sling/immobilizer at all times except to perform the exercises below or to occasionally let your arm dangle by your side to stretch your elbow. You also need to sleep in your sling immobilizer until instructed otherwise. It is ok to remove your sling if you are sitting in a controlled environment and allow your arm to rest in a position of comfort by your side or on your lap with pillows to give your neck and skin a break from the sling. You may remove it to allow arm to dangle by side to shower. If you are up walking around and when you go to sleep at night you need to wear it.  4. Range of motion to your elbow, wrist, and hand are encouraged 3-5 times daily. Exercise to your hand and fingers helps to reduce  swelling you may experience.   5. Prescriptions for a pain medication and a muscle relaxant are provided for you. It is recommended that if you are experiencing pain that you pain medication alone is not controlling, add the muscle relaxant along with the pain medication which can give additional pain relief. The first 1-2 days is generally the most severe of your pain and then should gradually decrease. As your pain lessens it is recommended that you decrease your use of the pain medications to an "as needed basis'" only and to always comply with the recommended dosages of the pain medications.  6. Pain medications can produce constipation along with their use. If you experience this, the use of an over the counter stool softener or laxative daily is recommended.   7. For additional questions or concerns, please do not hesitate to call the office. If after hours there is an answering service to forward your concerns to the physician on call.  8.Pain control following an exparel block  To help control your post-operative pain you received a nerve block  performed with Exparel which is a long acting anesthetic (numbing agent) which can provide pain relief and sensations of numbness (and relief of pain) in the operative shoulder and arm for up to 3 days. Sometimes it provides mixed relief, meaning you may still have numbness in certain areas of the arm but can still be able to   move  parts of that arm, hand, and fingers. We recommend that your prescribed pain medications  be used as needed. We do not feel it is necessary to "pre medicate" and "stay ahead" of pain.  Taking narcotic pain medications when you are not having any pain can lead to unnecessary and potentially dangerous side effects.    9. Use the ice machine as much as possible in the first 5-7 days from surgery, then you can wean its use to as needed. The ice typically needs to be replaced every 6 hours, instead of ice you can actually freeze  water bottles to put in the cooler and then fill water around them to avoid having to purchase ice. You can have spare water bottles freezing to allow you to rotate them once they have melted. Try to have a thin shirt or light cloth or towel under the ice wrap to protect your skin.   10.  We recommend that you avoid any dental work or cleaning in the first 3 months following your joint replacement. This is to help minimize the possibility of infection from the bacteria in your mouth that enters your bloodstream during dental work. We also recommend that you take an antibiotic prior to your dental work for the first year after your shoulder replacement to further help reduce that risk. Please simply contact our office for antibiotics to be sent to your pharmacy prior to dental work.  POST-OP EXERCISES  Pendulum Exercises  Perform pendulum exercises while standing and bending at the waist. Support your uninvolved arm on a table or chair and allow your operated arm to hang freely. Make sure to do these exercises passively - not using you shoulder muscles. These exercises can be performed once your nerve block effects have worn off.  Repeat 20 times. Do 3 sessions per day.

## 2019-07-28 NOTE — Anesthesia Procedure Notes (Signed)
Procedure Name: Intubation Date/Time: 07/28/2019 12:30 PM Performed by: Eben Burow, CRNA Pre-anesthesia Checklist: Patient identified, Emergency Drugs available, Suction available, Patient being monitored and Timeout performed Patient Re-evaluated:Patient Re-evaluated prior to induction Oxygen Delivery Method: Circle system utilized Preoxygenation: Pre-oxygenation with 100% oxygen Induction Type: IV induction Ventilation: Mask ventilation without difficulty Laryngoscope Size: Mac and 4 Grade View: Grade I Tube type: Oral Tube size: 7.0 mm Number of attempts: 1 Airway Equipment and Method: Stylet Placement Confirmation: ETT inserted through vocal cords under direct vision,  positive ETCO2 and breath sounds checked- equal and bilateral Secured at: 21 cm Tube secured with: Tape Dental Injury: Teeth and Oropharynx as per pre-operative assessment

## 2019-07-29 ENCOUNTER — Encounter: Payer: Self-pay | Admitting: *Deleted

## 2019-07-29 DIAGNOSIS — I10 Essential (primary) hypertension: Secondary | ICD-10-CM | POA: Diagnosis not present

## 2019-07-29 DIAGNOSIS — Z86718 Personal history of other venous thrombosis and embolism: Secondary | ICD-10-CM | POA: Diagnosis not present

## 2019-07-29 DIAGNOSIS — Z7982 Long term (current) use of aspirin: Secondary | ICD-10-CM | POA: Diagnosis not present

## 2019-07-29 DIAGNOSIS — M1990 Unspecified osteoarthritis, unspecified site: Secondary | ICD-10-CM | POA: Diagnosis not present

## 2019-07-29 DIAGNOSIS — M75101 Unspecified rotator cuff tear or rupture of right shoulder, not specified as traumatic: Secondary | ICD-10-CM | POA: Diagnosis not present

## 2019-07-29 DIAGNOSIS — M8580 Other specified disorders of bone density and structure, unspecified site: Secondary | ICD-10-CM | POA: Diagnosis not present

## 2019-07-29 MED ORDER — HYDROCODONE-ACETAMINOPHEN 5-325 MG PO TABS: 1.0000 | ORAL_TABLET | ORAL | 0 refills | Status: DC | PRN

## 2019-07-29 MED ORDER — CYCLOBENZAPRINE HCL 10 MG PO TABS: 5.0000 mg | ORAL_TABLET | Freq: Three times a day (TID) | ORAL | 1 refills | Status: DC | PRN

## 2019-07-29 MED ORDER — ONDANSETRON HCL 4 MG PO TABS: 4.0000 mg | ORAL_TABLET | Freq: Three times a day (TID) | ORAL | 0 refills | Status: DC | PRN

## 2019-07-29 NOTE — Evaluation (Signed)
Occupational Therapy Evaluation Patient Details Name: Priscilla [name] MRN: [id number] DOB: 04-05-1930 Today's Date: 07/29/2019    History of Present Illness patient s/p Right total reverse shoulder   Clinical Impression   Priscilla [name] presents s/p shoulder surgery without complaints of pain and reports numbness and limitation from nerve block. Therapist provided education and instruction to patient and daughter in regards to exercises, precautions, positioning, donning upper extremity clothing and bathing while maintaining shoulder precautions, ice and edema management and donning/doffing sling.  Therapist assisted patient with dressing with education. Patient and daughter verbalized understanding and demonstrated as needed. Patient to follow up with MD for further therapy needs.      Follow Up Recommendations  Follow surgeon's recommendation for DC plan and follow-up therapies    Equipment Recommendations       Recommendations for Other Services       Precautions / Restrictions Precautions Precautions: Shoulder Type of Shoulder Precautions: Active Shoulder Protocol: ER 20 deg, Abd 45 deg, FF 60 deg (within pain tolerance), no resisted internal rotation, no exercises for internal rotation, okay for pendulums, okay for lap slides, okay to use arm for feeding, hygiene and ADLs. Shoulder Interventions: Shoulder sling/immobilizer;Off for dressing/bathing/exercises Required Braces or Orthoses: Sling Restrictions Weight Bearing Restrictions: Yes      Mobility Bed Mobility Overal bed mobility: Needs Assistance Bed Mobility: Sit to Supine       Sit to supine: Supervision   General bed mobility comments: verbal cue to not bear weight through RUE. Discussed what side of bed to sleep on at home or in recliner.  Transfers                 General transfer comment: cga for transfers. Reports being dizzy since surgery.    Balance Overall balance assessment: No apparent  balance deficits (not formally assessed)                                         ADL either performed or assessed with clinical judgement   ADL Overall ADL's : Needs assistance/impaired Eating/Feeding: Set up   Grooming: Set up   Upper Body Bathing: Minimal assistance   Lower Body Bathing: Minimal assistance   Upper Body Dressing : Moderate assistance   Lower Body Dressing: Moderate assistance;Sit to/from stand                       Vision         Perception     Praxis      Pertinent Vitals/Pain       Hand Dominance Right   Extremity/Trunk Assessment Upper Extremity Assessment Upper Extremity Assessment: RUE deficits/detail RUE Deficits / Details: limited by pain, shoulder precautions           Communication Communication Communication: No difficulties   Cognition Arousal/Alertness: Awake/alert Behavior During Therapy: WFL for tasks assessed/performed Overall Cognitive Status: Within Functional Limits for tasks assessed                                     General Comments  cga for safety.    Exercises Other Exercises Other Exercises: Educated/demonstrated on Lap Slide exercises. hand out provided. Other Exercises: Education/demonstration on Hand, wrist and elbow exercises. hand out provided. Other Exercises: Educated/demonstrated on ROM restrictions. hand out  provided. Other Exercises: Educated/demonstrated pendulums. hand out provided.   Shoulder Instructions Shoulder Instructions Donning/doffing shirt without moving shoulder: Patient able to independently direct caregiver;Caregiver independent with task;Moderate assistance Method for sponge bathing under operated UE: Patient able to independently direct caregiver;Caregiver independent with task Donning/doffing sling/immobilizer: Patient able to independently direct caregiver;Caregiver independent with task;Independent Correct positioning of sling/immobilizer:  Independent;Patient able to independently direct caregiver;Caregiver independent with task Pendulum exercises (written home exercise program): Patient able to independently direct caregiver;Caregiver independent with task ROM for elbow, wrist and digits of operated UE: Patient able to independently direct caregiver;Caregiver independent with task Sling wearing schedule (on at all times/off for ADL's): Patient able to independently direct caregiver;Caregiver independent with task Proper positioning of operated UE when showering: Patient able to independently direct caregiver;Caregiver independent with task Dressing change: Patient able to independently direct caregiver;Caregiver independent with task Positioning of UE while sleeping: Caregiver independent with task;Patient able to independently direct caregiver    Home Living Family/patient expects to be discharged to:: Private residence Living Arrangements: Children   Type of Home: House Home Access: Stairs to enter Technical brewer of Steps: 2 Entrance Stairs-Rails: Can reach both Home Layout: One level     Bathroom Shower/Tub: Teacher, early years/pre: Standard Bathroom Accessibility: Yes   Home Equipment: Shower seat          Prior Functioning/Environment Level of Independence: Independent                 OT Problem List: Impaired vision/perception;Decreased range of motion;Impaired UE functional use;Pain      OT Treatment/Interventions:      OT Goals(Current goals can be found in the care plan section)    OT Frequency:     Barriers to D/C:            Co-evaluation              AM-PAC OT "6 Clicks" Daily Activity     Outcome Measure Help from another person eating meals?: A Little Help from another person taking care of personal grooming?: A Little Help from another person toileting, which includes using toliet, bedpan, or urinal?: A Little Help from another person bathing (including  washing, rinsing, drying)?: A Little Help from another person to put on and taking off regular upper body clothing?: A Lot Help from another person to put on and taking off regular lower body clothing?: A Lot 6 Click Score: 16   End of Session Nurse Communication: Other (comment)(RN notified patient did not need second OT visit.)  Activity Tolerance: Patient tolerated treatment well Patient left: in chair;with family/visitor present  OT Visit Diagnosis: Pain;Muscle weakness (generalized) (M62.81) Pain - part of body: Shoulder                Time: [id number] OT Time Calculation (min): 37 min Charges:  OT Evaluation $OT Eval Moderate Complexity: 1 Mod OT Treatments $Self Care/Home Management : 8-22 mins  Derl Barrow, OTR/L Acute Care Rehab Services  Office [phone]   Lenward Chancellor 07/29/2019, 1:13 PM

## 2019-07-29 NOTE — Discharge Summary (Signed)
PATIENT ID:      Priscilla [name]  MRN:     [id number] DOB/AGE:    07-15-1930 / 86 y.o.     DISCHARGE SUMMARY  ADMISSION DATE:    07/28/2019 DISCHARGE DATE:    ADMISSION DIAGNOSIS: Right shoulder rotator cuff tear arthropathy Past Medical History:  Diagnosis Date  . Aortic insufficiency    mild to moderate on echo 2017  . B12 deficiency 03/25/2014   H/o small bowel obstruction with resection as well as colorectal cancer s/p partial colectomy.  Receives B12 injections   . Cancer ([facility]) 2001, 2008   colon, rectal  . Chest pain, atypical    no ischemia on nuclear stress test 2017  . History of chicken pox   . History of Clostridium difficile 2012  . History of DVT (deep vein thrombosis) 2002   right leg, after chemo for first cancer, treated  . Hypertension   . IDA (iron deficiency anemia)   . Open wound of abdominal wall with complication   . Osteoarthritis   . Osteopenia 09/2011   DEXA 2013 spine -2.2, femur -1.6, DEXA 12/2015 - hip T score -1.9  . SBO (small bowel obstruction) ([facility]) 02/05/2011   On 02/05/11 she had enterolysis and small bowel resection     DISCHARGE DIAGNOSIS:   Active Problems:   S/P reverse total shoulder arthroplasty, right   PROCEDURE: Procedure(s): REVERSE SHOULDER ARTHROPLASTY on 07/28/2019  CONSULTS:    HISTORY:  See H&P in chart.  HOSPITAL COURSE:  Priscilla [name] is a 86 y.o. admitted on 07/28/2019 with a diagnosis of Right shoulder rotator cuff tear arthropathy.  They were brought to the operating room on 07/28/2019 and underwent Procedure(s): [place].    They were given perioperative antibiotics:  Anti-infectives (From admission, onward)   Start     Dose/Rate Route Frequency Ordered Stop   07/28/19 1030  ceFAZolin (ANCEF) IVPB 2g/100 mL premix     2 g 200 mL/hr over 30 Minutes Intravenous On call to O.R. 07/28/19 1016 07/28/19 1231    .  Patient underwent the above named procedure and tolerated it well. The following day  they were hemodynamically stable and pain was controlled on oral analgesics. They were neurovascularly intact to the operative extremity. OT was ordered and worked with patient per protocol. They were medically and orthopaedically stable for discharge on .    DIAGNOSTIC STUDIES:  RECENT RADIOGRAPHIC STUDIES :  No results found.  RECENT VITAL SIGNS:   Patient Vitals for the past 24 hrs:  BP Temp Temp src Pulse Resp SpO2 Height Weight  07/29/19 0517 (!) 138/55 98.4 F (36.9 C) Oral 63 16 98 % -- --  07/29/19 0116 (!) 129/46 98.4 F (36.9 C) Oral (!) 59 16 98 % -- --  07/28/19 2210 126/61 97.7 F (36.5 C) Oral 60 18 97 % -- --  07/28/19 1845 (!) 149/49 -- -- 71 16 100 % -- --  07/28/19 1745 (!) 140/58 97.8 F (36.6 C) Oral 69 16 100 % -- --  07/28/19 1645 (!) 140/57 97.8 F (36.6 C) Oral 71 16 100 % -- --  07/28/19 1544 (!) 167/65 97.7 F (36.5 C) Oral 72 17 100 % -- --  07/28/19 1500 (!) 168/66 97.6 F (36.4 C) -- 74 16 99 % -- --  07/28/19 1445 (!) 157/60 -- -- 70 13 98 % -- --  07/28/19 1430 (!) 172/67 -- -- 73 15 98 % -- --  07/28/19 1415 (!)  183/64 -- -- 76 20 99 % -- --  07/28/19 1400 (!) 176/60 -- -- 78 (!) 23 100 % -- --  07/28/19 1355 (!) 171/65 (!) 97.5 F (36.4 C) -- 80 18 100 % -- --  07/28/19 1148 -- -- -- 92 (!) 21 100 % -- --  07/28/19 1146 -- -- -- 75 20 100 % -- --  07/28/19 1144 -- -- -- 75 19 100 % -- --  07/28/19 1142 -- -- -- 70 20 100 % -- --  07/28/19 1140 -- -- -- 69 17 100 % -- --  07/28/19 1138 -- -- -- 69 17 100 % -- --  07/28/19 1136 -- -- -- 70 (!) 22 100 % -- --  07/28/19 1134 -- -- -- 69 (!) 22 100 % -- --  07/28/19 1132 -- -- -- 69 (!) 21 100 % -- --  07/28/19 1130 -- -- -- 67 16 100 % -- --  07/28/19 1128 -- -- -- 65 18 100 % -- --  07/28/19 1126 -- -- -- 64 18 100 % -- --  07/28/19 1124 -- -- -- 63 19 100 % -- --  07/28/19 1122 -- -- -- 70 18 100 % -- --  07/28/19 1120 (!) 175/58 -- -- 68 14 100 % -- --  07/28/19 1047 -- -- -- -- -- -- 5'  (1.524 m) 69.2 kg  07/28/19 1028 (!) 174/61 97.8 F (36.6 C) Oral 72 12 100 % -- --  .  RECENT EKG RESULTS:    Orders placed or performed during the hospital encounter of 07/19/19  . EKG 12 lead  . EKG 12 lead    DISCHARGE INSTRUCTIONS:    DISCHARGE MEDICATIONS:   Allergies as of 07/29/2019      Reactions   Morphine And Related Nausea And Vomiting   Lisinopril Cough   Losartan Cough      Medication List    TAKE these medications   acetaminophen 500 MG tablet Commonly known as: TYLENOL Take 500-1,000 mg by mouth every 6 (six) hours as needed for moderate pain or headache.   amLODipine 5 MG tablet Commonly known as: NORVASC Take 1 tablet (5 mg total) by mouth daily.   aspirin EC 81 MG tablet Take 81 mg by mouth daily.   atenolol 25 MG tablet Commonly known as: TENORMIN Take 1 tablet (25 mg total) by mouth 2 (two) times daily.   Biotin 5000 5 MG Caps Generic drug: Biotin Take 5 mg by mouth at bedtime.   CALCIUM 600 + D PO Take 1 tablet by mouth at bedtime.   calcium carbonate 500 MG chewable tablet Commonly known as: TUMS - dosed in mg elemental calcium Chew 1 tablet by mouth 2 (two) times daily as needed for indigestion or heartburn.   cyanocobalamin 1000 MCG/ML injection Commonly known as: (VITAMIN B-12) Inject 1,000 mcg into the muscle every 30 (thirty) days.   cyclobenzaprine 10 MG tablet Commonly known as: FLEXERIL Take 0.5-1 tablets (5-10 mg total) by mouth 3 (three) times daily as needed for muscle spasms.   D3-50 1.25 MG (50000 UT) capsule Generic drug: Cholecalciferol TAKE 1 CAPSULE BY MOUTH ONE TIME PER WEEK What changed: See the new instructions.   ferrous sulfate 325 (65 FE) MG tablet Take 325 mg by mouth daily with breakfast.   hydrALAZINE 25 MG tablet Commonly known as: APRESOLINE TAKE 1 TABLET BY MOUTH TWICE A DAY AS NEEDED (BP>160/100) What changed: See the new instructions.  hydrochlorothiazide 12.5 MG tablet Commonly known as:  HYDRODIURIL Take 1 tablet (12.5 mg total) by mouth daily.   HYDROcodone-acetaminophen 5-325 MG tablet Commonly known as: Norco Take 1 tablet by mouth every 4 (four) hours as needed.   nitroGLYCERIN 0.4 MG SL tablet Commonly known as: NITROSTAT Place 1 tablet (0.4 mg total) under the tongue every 5 (five) minutes as needed for chest pain.   ondansetron 4 MG tablet Commonly known as: Zofran Take 1 tablet (4 mg total) by mouth every 8 (eight) hours as needed for nausea or vomiting.       FOLLOW UP VISIT:   Follow-up Information    Justice Britain, MD.   Specialty: Orthopedic Surgery Why: in 10-14 days Contact information: [address] [place] [address] [id number]           DISCHARGE [id number]   DISCHARGE CONDITION:  Thereasa Parkin Chaneka Trefz for Dr. Justice Britain 07/29/2019, 8:14 AM

## 2019-08-10 DIAGNOSIS — Z96611 Presence of right artificial shoulder joint: Secondary | ICD-10-CM | POA: Diagnosis not present

## 2019-08-10 DIAGNOSIS — Z471 Aftercare following joint replacement surgery: Secondary | ICD-10-CM | POA: Diagnosis not present

## 2019-08-24 DIAGNOSIS — M25511 Pain in right shoulder: Secondary | ICD-10-CM | POA: Diagnosis not present

## 2019-08-29 DIAGNOSIS — M25511 Pain in right shoulder: Secondary | ICD-10-CM | POA: Diagnosis not present

## 2019-08-31 ENCOUNTER — Telehealth: Payer: Self-pay

## 2019-08-31 DIAGNOSIS — M25511 Pain in right shoulder: Secondary | ICD-10-CM | POA: Diagnosis not present

## 2019-08-31 NOTE — Telephone Encounter (Signed)
Pt called to inquire if she could restart B12 inj since she has not had since she has had surgery last month. Advised pt a msg should be sent to the provider to inquire if she could restart. Pt also reported she has had bilateral foot/ankle edema since starting amlodipine on 05/26/19. She reports she had the edema at her last OV on 3/30 but it has gotten worse since that time. Pt reports the edema is mostly gone in the AM when she wakes up but worsens as the day goes on. Pt reports for the last 4-5 wks she has had tingling in the bottom of her feet and sometimes has difficulty feeling her slippers on her feet when she wakes up at night and gets up. Pt denies SOB or chest pain.  BP readings for the last 5 days.                      AM                           PM 6/5               143/51                    143/52 6/6                                                128/50 6/7                151/50                   127/47 6/8              131/48                       145/52 6/9                133/52   Advised pt she should have an apt. Pt agreed and reported she has PT on Mondays and Wednesdays. Scheduled pt for Tuesday 6/15 because that is the soonest she could make it. She reports she has to get someone to drive her since her surgery. Advised if anything worsened or she developed any new symptoms to contact this office. Pt verbalized understanding.

## 2019-09-01 NOTE — Telephone Encounter (Signed)
Ok to restart b12 shots.  Will see at [facility].

## 2019-09-01 NOTE — Telephone Encounter (Signed)
Contacted pt and advised. She would like to restart the B12 inj at the office visit. Advised if anything is needed before the next apt to contact the office. Pt verbalized understanding.

## 2019-09-05 DIAGNOSIS — M25511 Pain in right shoulder: Secondary | ICD-10-CM | POA: Diagnosis not present

## 2019-09-06 ENCOUNTER — Ambulatory Visit (INDEPENDENT_AMBULATORY_CARE_PROVIDER_SITE_OTHER): Payer: Medicare Other | Admitting: Family Medicine

## 2019-09-06 ENCOUNTER — Encounter: Payer: Self-pay | Admitting: Family Medicine

## 2019-09-06 ENCOUNTER — Other Ambulatory Visit: Payer: Self-pay

## 2019-09-06 VITALS — BP 150/58 | HR 72 | Temp 97.9°F | Ht 59.5 in | Wt 153.5 lb

## 2019-09-06 DIAGNOSIS — E538 Deficiency of other specified B group vitamins: Secondary | ICD-10-CM

## 2019-09-06 DIAGNOSIS — H6121 Impacted cerumen, right ear: Secondary | ICD-10-CM

## 2019-09-06 DIAGNOSIS — R202 Paresthesia of skin: Secondary | ICD-10-CM

## 2019-09-06 DIAGNOSIS — I10 Essential (primary) hypertension: Secondary | ICD-10-CM | POA: Diagnosis not present

## 2019-09-06 DIAGNOSIS — G629 Polyneuropathy, unspecified: Secondary | ICD-10-CM | POA: Insufficient documentation

## 2019-09-06 MED ORDER — CYANOCOBALAMIN 1000 MCG/ML IJ SOLN
1000.0000 ug | Freq: Once | INTRAMUSCULAR | Status: AC
  Administered 2019-09-06: 1000 ug via INTRAMUSCULAR

## 2019-09-06 MED ORDER — HYDROCHLOROTHIAZIDE 25 MG PO TABS: 25.0000 mg | ORAL_TABLET | Freq: Every day | ORAL | 3 refills | Status: DC

## 2019-09-06 MED ORDER — AMLODIPINE BESYLATE 2.5 MG PO TABS: 2.5000 mg | ORAL_TABLET | Freq: Every day | ORAL | 1 refills | Status: DC

## 2019-09-06 NOTE — Assessment & Plan Note (Signed)
R cerumen impaction removed with alligator forceps with benefit, pt tolerated well.

## 2019-09-06 NOTE — Assessment & Plan Note (Signed)
Chronic, largely stable based on home log she brings. However intolerable dependent pedal edema so will drop amlodipine dose to 2.5mg  daily, increase hctz to 25mg  daily. RTC 2 wks lab visit only (Cr/K check). Reassess at [facility] in 2 months.

## 2019-09-06 NOTE — Assessment & Plan Note (Signed)
Update vitamin levels at upcoming lab appt.

## 2019-09-06 NOTE — Assessment & Plan Note (Signed)
b12 shot today. 

## 2019-09-06 NOTE — Patient Instructions (Addendum)
Dry wax removed today Let's drop amlodipine to 2.5mg  daily - cut current dose in half. Increase hydrochlorothiazide to 25mg  daily - double up on current dose.  New doses will be at pharmacy.  Return in 2 weeks for lab visit only to check potassium level.

## 2019-09-06 NOTE — Progress Notes (Signed)
This visit was conducted in person.  BP (!) 150/58 (BP Location: Left Arm, Patient Position: Sitting, Cuff Size: Normal)   Pulse 72   Temp 97.9 F (36.6 C) (Temporal)   Ht 4' 11.5" (1.511 m)   Wt 153 lb 8 oz (69.6 kg)   SpO2 98%   BMI 30.48 kg/m    CC: leg swelling, check ear Subjective:    Patient ID: Priscilla [name], female    DOB: Nov 04, 1930, 86 y.o.   MRN: [id number]  HPI: Priscilla [name] is a 86 y.o. female presenting on 09/06/2019 for Leg Swelling (C/o BLE since starting amlodipine. ) and Ear Problem (C/o popping sound in right ear.  Started wks ago.  Denies any pain. )   Leg swelling since starting amlodipine 5mg  daily - this is in addition to her atenolol 25mg  bid, hctz 12.5mg  daily and hydralazine 25mg  BID PRN. Atenolol recently decreased due to AV block noted on EKG. H/o cough to lisinopril and losartan. Notes especially towards the end of the day, notes foot numbness at soles as well. No parethesia or burning pain of soles of feet. No HA, CP/tightness, SOB, dizziness or lightheadedness. Brings BP log - 130-150/50s over past 2 weeks.   Increased family stress - sister in the hospital currently with CHF, daughter has had 2 blood clots in last few months, good friend's son drowned yesterday at the beach.   Several weeks of R ear popping without pain. No tinnitus. No recent swimming, no drainage from ears. No fever, sinus congestion.   She had R reverse shoulder surgery (Dr Onnie Graham). She is currently undergoing physical therapy.      Relevant past medical, surgical, family and social history reviewed and updated as indicated. Interim medical history since our last visit reviewed. Allergies and medications reviewed and updated. Outpatient Medications Prior to Visit  Medication Sig Dispense Refill  . acetaminophen (TYLENOL) 500 MG tablet Take 500-1,000 mg by mouth every 6 (six) hours as needed for moderate pain or headache.    Marland Kitchen aspirin EC 81 MG tablet Take 81 mg by mouth  daily.    Marland Kitchen atenolol (TENORMIN) 25 MG tablet Take 1 tablet (25 mg total) by mouth 2 (two) times daily. 180 tablet 1  . Biotin (BIOTIN 5000) 5 MG CAPS Take 5 mg by mouth at bedtime.     . Calcium Carb-Cholecalciferol (CALCIUM 600 + D PO) Take 1 tablet by mouth at bedtime.    . calcium carbonate (TUMS - DOSED IN MG ELEMENTAL CALCIUM) 500 MG chewable tablet Chew 1 tablet by mouth 2 (two) times daily as needed for indigestion or heartburn.    . cyanocobalamin (,VITAMIN B-12,) 1000 MCG/ML injection Inject 1,000 mcg into the muscle every 30 (thirty) days.  1 mL 0  . ferrous sulfate 325 (65 FE) MG tablet Take 325 mg by mouth daily with breakfast.    . hydrALAZINE (APRESOLINE) 25 MG tablet TAKE 1 TABLET BY MOUTH TWICE A DAY AS NEEDED (BP>160/100) (Patient taking differently: Take 25 mg by mouth 2 (two) times daily as needed ((BP>160/100)). ) 30 tablet 0  . nitroGLYCERIN (NITROSTAT) 0.4 MG SL tablet Place 1 tablet (0.4 mg total) under the tongue every 5 (five) minutes as needed for chest pain. 25 tablet 1  . amLODipine (NORVASC) 5 MG tablet Take 1 tablet (5 mg total) by mouth daily. 90 tablet 3  . hydrochlorothiazide (HYDRODIURIL) 12.5 MG tablet Take 1 tablet (12.5 mg total) by mouth daily. 90 tablet 3  .  D3-50 1.25 MG (50000 UT) capsule TAKE 1 CAPSULE BY MOUTH ONE TIME PER WEEK (Patient not taking: Reported on 09/06/2019) 12 capsule 1  . cyclobenzaprine (FLEXERIL) 10 MG tablet Take 0.5-1 tablets (5-10 mg total) by mouth 3 (three) times daily as needed for muscle spasms. 30 tablet 1  . HYDROcodone-acetaminophen (NORCO) 5-325 MG tablet Take 1 tablet by mouth every 4 (four) hours as needed. 20 tablet 0  . ondansetron (ZOFRAN) 4 MG tablet Take 1 tablet (4 mg total) by mouth every 8 (eight) hours as needed for nausea or vomiting. 10 tablet 0   Facility-Administered Medications Prior to Visit  Medication Dose Route Frequency Provider Last Rate Last Admin  . cyanocobalamin ((VITAMIN B-12)) injection 1,000 mcg   1,000 mcg Intramuscular Once Ria Bush, MD         Per HPI unless specifically indicated in ROS section below Review of Systems Objective:  BP (!) 150/58 (BP Location: Left Arm, Patient Position: Sitting, Cuff Size: Normal)   Pulse 72   Temp 97.9 F (36.6 C) (Temporal)   Ht 4' 11.5" (1.511 m)   Wt 153 lb 8 oz (69.6 kg)   SpO2 98%   BMI 30.48 kg/m   Wt Readings from Last 3 Encounters:  09/06/19 153 lb 8 oz (69.6 kg)  07/28/19 152 lb 8 oz (69.2 kg)  07/19/19 152 lb 8 oz (69.2 kg)      Physical Exam Vitals and nursing note reviewed.  Constitutional:      Appearance: Normal appearance. She is not ill-appearing.  HENT:     Right Ear: Tympanic membrane, ear canal and external ear normal. There is impacted cerumen.     Left Ear: Tympanic membrane, ear canal and external ear normal. There is no impacted cerumen.     Ears:     Comments: Large long piece of dry skin/wax removed from right ear with alligator forceps with resolution of symptoms Cardiovascular:     Rate and Rhythm: Normal rate and regular rhythm.     Pulses: Normal pulses.     Heart sounds: Normal heart sounds. No murmur heard.   Pulmonary:     Effort: Pulmonary effort is normal. No respiratory distress.     Breath sounds: Normal breath sounds. No wheezing or rales.  Musculoskeletal:     Right lower leg: Edema (tr) present.     Left lower leg: Edema (tr) present.  Skin:    Findings: No rash.  Neurological:     Mental Status: She is alert.  Psychiatric:        Mood and Affect: Mood normal.        Behavior: Behavior normal.       Lab Results  Component Value Date   CREATININE 0.80 07/19/2019   BUN 24 (H) 07/19/2019   NA 135 07/19/2019   K 4.4 07/19/2019   CL 100 07/19/2019   CO2 25 07/19/2019    Assessment & Plan:  This visit occurred during the SARS-CoV-2 public health emergency.  Safety protocols were in place, including screening questions prior to the visit, additional usage of staff PPE, and  extensive cleaning of exam room while observing appropriate contact time as indicated for disinfecting solutions.   Problem List Items Addressed This Visit    Vitamin B12 deficiency    b12 shot today.       Paresthesia of foot, bilateral    Update vitamin levels at upcoming lab appt.      Relevant Orders   Folate  Vitamin B12   TSH   Essential hypertension - Primary    Chronic, largely stable based on home log she brings. However intolerable dependent pedal edema so will drop amlodipine dose to 2.5mg  daily, increase hctz to 25mg  daily. RTC 2 wks lab visit only (Cr/K check). Reassess at [facility] in 2 months.       Relevant Medications   amLODipine (NORVASC) 2.5 MG tablet   hydrochlorothiazide (HYDRODIURIL) 25 MG tablet   Other Relevant Orders   Basic metabolic panel   Cerumen impaction    R cerumen impaction removed with alligator forceps with benefit, pt tolerated well.           Meds ordered this encounter  Medications  . cyanocobalamin ((VITAMIN B-12)) injection 1,000 mcg  . amLODipine (NORVASC) 2.5 MG tablet    Sig: Take 1 tablet (2.5 mg total) by mouth daily.    Dispense:  90 tablet    Refill:  1    note new sig  . hydrochlorothiazide (HYDRODIURIL) 25 MG tablet    Sig: Take 1 tablet (25 mg total) by mouth daily.    Dispense:  90 tablet    Refill:  3    Note new sig   Orders Placed This Encounter  Procedures  . Basic metabolic panel    Standing Status:   Future    Standing Expiration Date:   09/05/2020  . Folate    Standing Status:   Future    Standing Expiration Date:   09/05/2020  . Vitamin B12    Standing Status:   Future    Standing Expiration Date:   09/05/2020  . TSH    Standing Status:   Future    Standing Expiration Date:   09/05/2020    Patient Instructions  Dry wax removed today Let's drop amlodipine to 2.5mg  daily - cut current dose in half. Increase hydrochlorothiazide to 25mg  daily - double up on current dose.  New doses will be at pharmacy.    Return in 2 weeks for lab visit only to check potassium level.    Follow up plan: No follow-ups on file.  Ria Bush, MD

## 2019-09-07 DIAGNOSIS — Z96611 Presence of right artificial shoulder joint: Secondary | ICD-10-CM | POA: Diagnosis not present

## 2019-09-07 DIAGNOSIS — Z4731 Aftercare following explantation of shoulder joint prosthesis: Secondary | ICD-10-CM | POA: Diagnosis not present

## 2019-09-09 ENCOUNTER — Other Ambulatory Visit: Payer: Self-pay | Admitting: Family Medicine

## 2019-09-20 ENCOUNTER — Other Ambulatory Visit: Payer: Medicare Other

## 2019-09-28 ENCOUNTER — Other Ambulatory Visit: Payer: Self-pay

## 2019-09-28 ENCOUNTER — Other Ambulatory Visit (INDEPENDENT_AMBULATORY_CARE_PROVIDER_SITE_OTHER): Payer: Medicare Other

## 2019-09-28 ENCOUNTER — Other Ambulatory Visit: Payer: Self-pay | Admitting: Family Medicine

## 2019-09-28 ENCOUNTER — Other Ambulatory Visit: Payer: Self-pay | Admitting: Cardiology

## 2019-09-28 DIAGNOSIS — R202 Paresthesia of skin: Secondary | ICD-10-CM

## 2019-09-28 DIAGNOSIS — I10 Essential (primary) hypertension: Secondary | ICD-10-CM

## 2019-09-28 LAB — BASIC METABOLIC PANEL
BUN: 15 mg/dL (ref 6–23)
CO2: 25 mEq/L (ref 19–32)
Calcium: 9.7 mg/dL (ref 8.4–10.5)
Chloride: 98 mEq/L (ref 96–112)
Creatinine, Ser: 0.83 mg/dL (ref 0.40–1.20)
GFR: 65.06 mL/min (ref >60.00)
Glucose, Bld: 101 mg/dL — ABNORMAL HIGH (ref 70–99)
Potassium: 4.1 mEq/L (ref 3.5–5.1)
Sodium: 134 mEq/L — ABNORMAL LOW (ref 135–145)

## 2019-09-28 LAB — TSH: TSH: 2.9 u[IU]/mL (ref 0.35–4.50)

## 2019-09-28 LAB — FOLATE: Folate: >24.8 ng/mL (ref >5.9)

## 2019-09-28 LAB — VITAMIN B12: Vitamin B-12: 323 pg/mL (ref 211–911)

## 2019-09-29 ENCOUNTER — Other Ambulatory Visit: Payer: Self-pay

## 2019-09-29 ENCOUNTER — Encounter: Payer: Self-pay | Admitting: Internal Medicine

## 2019-09-29 ENCOUNTER — Ambulatory Visit (INDEPENDENT_AMBULATORY_CARE_PROVIDER_SITE_OTHER): Payer: Medicare Other | Admitting: Internal Medicine

## 2019-09-29 VITALS — BP 142/70 | HR 64 | Temp 97.2°F | Wt 153.0 lb

## 2019-09-29 DIAGNOSIS — B029 Zoster without complications: Secondary | ICD-10-CM | POA: Diagnosis not present

## 2019-09-29 MED ORDER — VALACYCLOVIR HCL 1 G PO TABS
1000.0000 mg | ORAL_TABLET | Freq: Three times a day (TID) | ORAL | 0 refills | Status: DC
Start: 2019-09-29 — End: 2020-02-15

## 2019-09-29 NOTE — Telephone Encounter (Signed)
E-scribed refill.  Plz schedule wellness, cpe and lab visits.  

## 2019-09-29 NOTE — Patient Instructions (Signed)
Shingles  Shingles is an infection. It gives you a painful skin rash and blisters that have fluid in them. Shingles is caused by the same germ (virus) that causes chickenpox. Shingles only happens in people who:  Have had chickenpox.  Have been given a shot of medicine (vaccine) to protect against chickenpox. Shingles is rare in this group. The first symptoms of shingles may be itching, tingling, or pain in an area on your skin. A rash will show on your skin a few days or weeks later. The rash is likely to be on one side of your body. The rash usually has a shape like a belt or a band. Over time, the rash turns into fluid-filled blisters. The blisters will break open, change into scabs, and dry up. Medicines may:  Help with pain and itching.  Help you get better sooner.  Help to prevent long-term problems. Follow these instructions at home: Medicines  Take over-the-counter and prescription medicines only as told by your doctor.  Put on an anti-itch cream or numbing cream where you have a rash, blisters, or scabs. Do this as told by your doctor. Helping with itching and discomfort   Put cold, wet cloths (cold compresses) on the area of the rash or blisters as told by your doctor.  Cool baths can help you feel better. Try adding baking soda or dry oatmeal to the water to lessen itching. Do not bathe in hot water. Blister and rash care  Keep your rash covered with a loose bandage (dressing).  Wear loose clothing that does not rub on your rash.  Keep your rash and blisters clean. To do this, wash the area with mild soap and cool water as told by your doctor.  Check your rash every day for signs of infection. Check for: ? More redness, swelling, or pain. ? Fluid or blood. ? Warmth. ? Pus or a bad smell.  Do not scratch your rash. Do not pick at your blisters. To help you to not scratch: ? Keep your fingernails clean and cut short. ? Wear gloves or mittens when you sleep, if  scratching is a problem. General instructions  Rest as told by your doctor.  Keep all follow-up visits as told by your doctor. This is important.  Wash your hands often with soap and water. If soap and water are not available, use hand sanitizer. Doing this lowers your chance of getting a skin infection caused by germs (bacteria).  Your infection can cause chickenpox in people who have never had chickenpox or never got a shot of chickenpox vaccine. If you have blisters that did not change into scabs yet, try not to touch other people or be around other people, especially: ? Babies. ? Pregnant women. ? Children who have areas of red, itchy, or rough skin (eczema). ? Very old people who have transplants. ? People who have a long-term (chronic) sickness, like cancer or AIDS. Contact a doctor if:  Your pain does not get better with medicine.  Your pain does not get better after the rash heals.  You have any signs of infection in the rash area. These signs include: ? More redness, swelling, or pain around the rash. ? Fluid or blood coming from the rash. ? The rash area feeling warm to the touch. ? Pus or a bad smell coming from the rash. Get help right away if:  The rash is on your face or nose.  You have pain in your face or pain by   your eye.  You lose feeling on one side of your face.  You have trouble seeing.  You have ear pain, or you have ringing in your ear.  You have a loss of taste.  Your condition gets worse. Summary  Shingles gives you a painful skin rash and blisters that have fluid in them.  Shingles is an infection. It is caused by the same germ (virus) that causes chickenpox.  Keep your rash covered with a loose bandage (dressing). Wear loose clothing that does not rub on your rash.  If you have blisters that did not change into scabs yet, try not to touch other people or be around people. This information is not intended to replace advice given to you by  your health care provider. Make sure you discuss any questions you have with your health care provider. Document Revised: 07/02/2018 Document Reviewed: 11/12/2016 Elsevier Patient Education  2020 Elsevier Inc.  

## 2019-09-29 NOTE — Progress Notes (Signed)
Subjective:    Patient ID: Priscilla [name], female    DOB: March 20, 1931, 86 y.o.   MRN: [id number]  HPI  Patient presents to the clinic today with complaint of a rash.  She noticed this 5 to 6 days ago.  She reports the rash itches and is painful.  She denies changes in soaps, lotions or detergents.  She has tried cortisone cream OTC with minimal relief of symptoms.  She has had shingles in the past in the same exact location and reports this feels the same.  She has not had a shingles vaccine.  Review of Systems      Past Medical History:  Diagnosis Date  . Aortic insufficiency    mild to moderate on echo 2017  . B12 deficiency 03/25/2014   H/o small bowel obstruction with resection as well as colorectal cancer s/p partial colectomy.  Receives B12 injections   . Cancer ([place]) 2001, 2008   colon, rectal  . Chest pain, atypical    no ischemia on nuclear stress test 2017  . History of chicken pox   . History of Clostridium difficile 2012  . History of DVT (deep vein thrombosis) 2002   right leg, after chemo for first cancer, treated  . Hypertension   . IDA (iron deficiency anemia)   . Open wound of abdominal wall with complication   . Osteoarthritis   . Osteopenia 09/2011   DEXA 2013 spine -2.2, femur -1.6, DEXA 12/2015 - hip T score -1.9  . SBO (small bowel obstruction) ([place]) 02/05/2011   On 02/05/11 she had enterolysis and small bowel resection     Current Outpatient Medications  Medication Sig Dispense Refill  . acetaminophen (TYLENOL) 500 MG tablet Take 500-1,000 mg by mouth every 6 (six) hours as needed for moderate pain or headache.    Marland Kitchen amLODipine (NORVASC) 2.5 MG tablet Take 1 tablet (2.5 mg total) by mouth daily. 90 tablet 1  . aspirin EC 81 MG tablet Take 81 mg by mouth daily.    Marland Kitchen atenolol (TENORMIN) 25 MG tablet Take 1 tablet (25 mg total) by mouth 2 (two) times daily. 180 tablet 1  . Biotin (BIOTIN 5000) 5 MG CAPS Take 5 mg by mouth at bedtime.     . Calcium  Carb-Cholecalciferol (CALCIUM 600 + D PO) Take 1 tablet by mouth at bedtime.    . calcium carbonate (TUMS - DOSED IN MG ELEMENTAL CALCIUM) 500 MG chewable tablet Chew 1 tablet by mouth 2 (two) times daily as needed for indigestion or heartburn.    . cyanocobalamin (,VITAMIN B-12,) 1000 MCG/ML injection Inject 1,000 mcg into the muscle every 30 (thirty) days.  1 mL 0  . D3-50 1.25 MG (50000 UT) capsule TAKE 1 CAPSULE BY MOUTH ONE TIME PER WEEK 12 capsule 1  . ferrous sulfate 325 (65 FE) MG tablet Take 325 mg by mouth daily with breakfast.    . hydrALAZINE (APRESOLINE) 25 MG tablet TAKE 1 TABLET BY MOUTH TWICE A DAY AS NEEDED (BP>160/100) (Patient taking differently: Take 25 mg by mouth 2 (two) times daily as needed ((BP>160/100)). ) 30 tablet 0  . hydrochlorothiazide (HYDRODIURIL) 25 MG tablet Take 1 tablet (25 mg total) by mouth daily. 90 tablet 3  . nitroGLYCERIN (NITROSTAT) 0.4 MG SL tablet Place 1 tablet (0.4 mg total) under the tongue every 5 (five) minutes as needed for chest pain. 25 tablet 1   Current Facility-Administered Medications  Medication Dose Route Frequency Provider Last Rate Last Admin  .  cyanocobalamin ((VITAMIN B-12)) injection 1,000 mcg  1,000 mcg Intramuscular Once Ria Bush, MD        Allergies  Allergen Reactions  . Morphine And Related Nausea And Vomiting  . Lisinopril Cough  . Losartan Cough    Family History  Problem Relation Age of Onset  . Heart disease Mother        valve problems  . Heart attack Mother   . Congestive Heart Failure Mother   . Heart disease Father        due to lupus  . Lupus Father   . Heart attack Father   . Colon cancer Paternal Grandmother   . Coronary artery disease Brother        s/p CABG  . Coronary artery disease Sister        s/p CABG  . Heart attack Brother   . Heart attack Sister   . Diabetes Neg Hx   . Stroke Neg Hx   . Hypertension Neg Hx     Social History   Socioeconomic History  . Marital status:  Widowed    Spouse name: Not on file  . Number of children: Not on file  . Years of education: Not on file  . Highest education level: Not on file  Occupational History  . Not on file  Tobacco Use  . Smoking status: Never Smoker  . Smokeless tobacco: Never Used  Vaping Use  . Vaping Use: Never used  Substance and Sexual Activity  . Alcohol use: No  . Drug use: No  . Sexual activity: Not Currently  Other Topics Concern  . Not on file  Social History Narrative   Caffeine: decaf   Lives alone, daughter lives nearby to visit.   Occupation: retired, worked at [employer] office   Edu: HS   Activity: tries to get walking, walks up and down steps at home   Diet: good water, fruits/vegetables daily.  1 cup milk/day, cheese.      Cards: Dr. Mare Ferrari   Surgeon: Dr. Rush Farmer   Social Determinants of Health   Financial Resource Strain:   . Difficulty of Paying Living Expenses:   Food Insecurity:   . Worried About Charity fundraiser in the Last Year:   . Arboriculturist in the Last Year:   Transportation Needs:   . Film/video editor (Medical):   Marland Kitchen Lack of Transportation (Non-Medical):   Physical Activity:   . Days of Exercise per Week:   . Minutes of Exercise per Session:   Stress:   . Feeling of Stress :   Social Connections:   . Frequency of Communication with Friends and Family:   . Frequency of Social Gatherings with Friends and Family:   . Attends Religious Services:   . Active Member of Clubs or Organizations:   . Attends Archivist Meetings:   Marland Kitchen Marital Status:   Intimate Partner Violence:   . Fear of Current or Ex-Partner:   . Emotionally Abused:   Marland Kitchen Physically Abused:   . Sexually Abused:      Constitutional: Denies fever, malaise, fatigue, headache or abrupt weight changes.  Respiratory: Denies difficulty breathing, shortness of breath, cough or sputum production.   Cardiovascular: Denies chest pain, chest tightness, palpitations or swelling in the  hands or feet.  Skin: Patient reports rash.  Denies ulcercations.    No other specific complaints in a complete review of systems (except as listed in HPI above).  Objective:  Physical Exam  BP (!) 142/70   Pulse 64   Temp (!) 97.2 F (36.2 C) (Temporal)   Wt 153 lb (69.4 kg)   SpO2 97%   BMI 30.39 kg/m  Wt Readings from Last 3 Encounters:  09/29/19 153 lb (69.4 kg)  09/06/19 153 lb 8 oz (69.6 kg)  07/28/19 152 lb 8 oz (69.2 kg)    General: Appears her stated age, well developed, well nourished in NAD. Skin: Grouped vesicular lesions on erythematous base noted starting at midline posterior neck extending around to the left shoulder and left anterior chest. Cardiovascular: Normal rate Pulmonary/Chest: Normal effort and positive vesicular breath sounds.  Neurological: Alert and oriented.   BMET    Component Value Date/Time   NA 134 (L) 09/28/2019 0840   NA 139 08/25/2018 0947   K 4.1 09/28/2019 0840   CL 98 09/28/2019 0840   CO2 25 09/28/2019 0840   GLUCOSE 101 (H) 09/28/2019 0840   BUN 15 09/28/2019 0840   BUN 20 08/25/2018 0947   CREATININE 0.83 09/28/2019 0840   CREATININE 0.92 (H) 09/22/2016 0846   CALCIUM 9.7 09/28/2019 0840   GFRNONAA >60 07/19/2019 1116   GFRAA >60 07/19/2019 1116    Lipid Panel     Component Value Date/Time   CHOL 191 11/04/2018 0810   TRIG 102.0 11/04/2018 0810   HDL 84.00 11/04/2018 0810   CHOLHDL 2 11/04/2018 0810   VLDL 20.4 11/04/2018 0810   LDLCALC 86 11/04/2018 0810    CBC    Component Value Date/Time   WBC 5.8 07/19/2019 1116   RBC 3.77 (L) 07/19/2019 1116   HGB 11.6 (L) 07/19/2019 1116   HCT 35.6 (L) 07/19/2019 1116   PLT 278 07/19/2019 1116   MCV 94.4 07/19/2019 1116   MCH 30.8 07/19/2019 1116   MCHC 32.6 07/19/2019 1116   RDW 12.3 07/19/2019 1116   LYMPHSABS 1.1 05/26/2019 0956   MONOABS 0.6 05/26/2019 0956   EOSABS 0.1 05/26/2019 0956   BASOSABS 0.1 05/26/2019 0956    Hgb A1C Lab Results  Component  Value Date   HGBA1C  12/24/2006    5.7 (NOTE)   The ADA recommends the following therapeutic goals for glycemic   control related to Hgb A1C measurement:   Goal of Therapy:   < 7.0% Hgb A1C   Action Suggested:  > 8.0% Hgb A1C   Ref:  Diabetes Care, 22, Suppl. 1, 1999            Assessment & Plan:   Shingles:  Rx for Valtrex 1 g 1 tab p.o. 3 times daily x7 days She declines Rx for Gabapentin at this time Encouraged her to get a shingles vaccine once this has resolved.  Return precautions discussed Webb Silversmith, NP This visit occurred during the SARS-CoV-2 public health emergency.  Safety protocols were in place, including screening questions prior to the visit, additional usage of staff PPE, and extensive cleaning of exam room while observing appropriate contact time as indicated for disinfecting solutions.

## 2019-09-30 ENCOUNTER — Telehealth: Payer: Self-pay | Admitting: Family Medicine

## 2019-09-30 MED ORDER — GABAPENTIN 100 MG PO CAPS
100.0000 mg | ORAL_CAPSULE | Freq: Three times a day (TID) | ORAL | 0 refills | Status: DC
Start: 2019-09-30 — End: 2020-02-15

## 2019-09-30 NOTE — Telephone Encounter (Signed)
Per VO Gabapentin sent to the pharmacy  Pt is aware

## 2019-09-30 NOTE — Addendum Note (Signed)
Addended by: Lurlean Nanny on: 09/30/2019 03:53 PM   Modules accepted: Orders

## 2019-09-30 NOTE — Telephone Encounter (Signed)
Patient called today. She was in the office on 7/8 for shingles.  She stated that at the time she did not want anything for pain. She said now the pain in getting worse and would like something called in . She also stated that her ear near where they are located is starting to hurt.    Patient would like to know if this could be called in today

## 2019-10-17 DIAGNOSIS — Z96611 Presence of right artificial shoulder joint: Secondary | ICD-10-CM | POA: Diagnosis not present

## 2019-10-17 DIAGNOSIS — Z471 Aftercare following joint replacement surgery: Secondary | ICD-10-CM | POA: Diagnosis not present

## 2019-10-25 ENCOUNTER — Ambulatory Visit (INDEPENDENT_AMBULATORY_CARE_PROVIDER_SITE_OTHER): Payer: Medicare Other | Admitting: *Deleted

## 2019-10-25 ENCOUNTER — Other Ambulatory Visit: Payer: Self-pay

## 2019-10-25 DIAGNOSIS — E538 Deficiency of other specified B group vitamins: Secondary | ICD-10-CM

## 2019-10-25 MED ORDER — CYANOCOBALAMIN 1000 MCG/ML IJ SOLN
1000.0000 ug | Freq: Once | INTRAMUSCULAR | Status: AC
Start: 2019-10-25 — End: 2019-10-25
  Administered 2019-10-25: 1000 ug via INTRAMUSCULAR

## 2019-10-25 NOTE — Progress Notes (Signed)
Per orders of Dr. Danise Mina, injection of b12 given by Tammi Sou. Patient tolerated injection well.

## 2019-10-26 DIAGNOSIS — D225 Melanocytic nevi of trunk: Secondary | ICD-10-CM | POA: Diagnosis not present

## 2019-10-26 DIAGNOSIS — L820 Inflamed seborrheic keratosis: Secondary | ICD-10-CM | POA: Diagnosis not present

## 2019-10-26 DIAGNOSIS — L570 Actinic keratosis: Secondary | ICD-10-CM | POA: Diagnosis not present

## 2019-10-26 DIAGNOSIS — D0439 Carcinoma in situ of skin of other parts of face: Secondary | ICD-10-CM | POA: Diagnosis not present

## 2019-10-26 DIAGNOSIS — X32XXXA Exposure to sunlight, initial encounter: Secondary | ICD-10-CM | POA: Diagnosis not present

## 2019-11-30 ENCOUNTER — Ambulatory Visit: Payer: Medicare Other

## 2019-11-30 DIAGNOSIS — Z96611 Presence of right artificial shoulder joint: Secondary | ICD-10-CM | POA: Diagnosis not present

## 2019-11-30 DIAGNOSIS — Z471 Aftercare following joint replacement surgery: Secondary | ICD-10-CM | POA: Diagnosis not present

## 2019-12-01 ENCOUNTER — Other Ambulatory Visit: Payer: Self-pay

## 2019-12-01 ENCOUNTER — Ambulatory Visit (INDEPENDENT_AMBULATORY_CARE_PROVIDER_SITE_OTHER): Payer: Medicare Other

## 2019-12-01 DIAGNOSIS — E538 Deficiency of other specified B group vitamins: Secondary | ICD-10-CM | POA: Diagnosis not present

## 2019-12-01 MED ORDER — CYANOCOBALAMIN 1000 MCG/ML IJ SOLN
1000.0000 ug | Freq: Once | INTRAMUSCULAR | Status: AC
  Administered 2019-12-01: 1000 ug via INTRAMUSCULAR

## 2019-12-01 NOTE — Progress Notes (Signed)
Per orders of Dr. Damita Dunnings, in the absence of Dr. Danise Mina, a monthly injection of B 12 given by Loreen Freud. Patient tolerated injection well.

## 2019-12-16 ENCOUNTER — Other Ambulatory Visit: Payer: Self-pay | Admitting: Family Medicine

## 2019-12-28 ENCOUNTER — Telehealth: Payer: Self-pay | Admitting: Family Medicine

## 2019-12-28 NOTE — Telephone Encounter (Signed)
Patient called in stating she missed a call from office. No message or appointment. Please advise.

## 2019-12-29 NOTE — Telephone Encounter (Signed)
I also do not see any messages or results showing someone from our office called pt.

## 2020-01-03 DIAGNOSIS — Z23 Encounter for immunization: Secondary | ICD-10-CM | POA: Diagnosis not present

## 2020-01-04 ENCOUNTER — Ambulatory Visit (INDEPENDENT_AMBULATORY_CARE_PROVIDER_SITE_OTHER): Payer: Medicare Other

## 2020-01-04 ENCOUNTER — Other Ambulatory Visit: Payer: Self-pay

## 2020-01-04 DIAGNOSIS — E538 Deficiency of other specified B group vitamins: Secondary | ICD-10-CM | POA: Diagnosis not present

## 2020-01-04 MED ORDER — CYANOCOBALAMIN 1000 MCG/ML IJ SOLN
1000.0000 ug | Freq: Once | INTRAMUSCULAR | Status: AC
  Administered 2020-01-04: 1000 ug via INTRAMUSCULAR

## 2020-01-04 NOTE — Progress Notes (Signed)
Per orders of Dr. Javier Gutierrez, injection of B-12 given by Keairra Bardon Y Vibha Ferdig in right deltoid. Patient tolerated injection well.    

## 2020-01-05 ENCOUNTER — Ambulatory Visit: Payer: Medicare Other | Admitting: Cardiology

## 2020-01-30 ENCOUNTER — Other Ambulatory Visit: Payer: Self-pay | Admitting: Family Medicine

## 2020-01-31 ENCOUNTER — Ambulatory Visit (INDEPENDENT_AMBULATORY_CARE_PROVIDER_SITE_OTHER): Payer: Medicare Other

## 2020-01-31 DIAGNOSIS — Z0000 Encounter for general adult medical examination without abnormal findings: Secondary | ICD-10-CM

## 2020-01-31 NOTE — Patient Instructions (Signed)
Priscilla [name] , Thank you for taking time to come for your Medicare Wellness Visit. I appreciate your ongoing commitment to your health goals. Please review the following plan we discussed and let me know if I can assist you in the future.   Screening recommendations/referrals: Colonoscopy: Up to date, completed 01/25/2018, due 01/2023 Mammogram: due, will discuss with provider at visit Bone Density: due, will discuss with provider at visit  Recommended yearly ophthalmology/optometry visit for glaucoma screening and checkup Recommended yearly dental visit for hygiene and checkup  Vaccinations: Influenza vaccine: Up to date, completed 12/23/2019, due 10/2020 Pneumococcal vaccine: Completed series Tdap vaccine: Up to date, completed 09/11/2011, due 08/2021 Shingles vaccine: due, check with your insurance regarding coverage if interested    Covid-19:Completed series  Advanced directives: copy in chart  Conditions/risks identified: hypertension  Next appointment: Follow up in one year for your annual wellness visit    Preventive Care 84 Years and Older, Female Preventive care refers to lifestyle choices and visits with your health care provider that can promote health and wellness. What does preventive care include?  A yearly physical exam. This is also called an annual well check.  Dental exams once or twice a year.  Routine eye exams. Ask your health care provider how often you should have your eyes checked.  Personal lifestyle choices, including:  Daily care of your teeth and gums.  Regular physical activity.  Eating a healthy diet.  Avoiding tobacco and drug use.  Limiting alcohol use.  Practicing safe sex.  Taking low-dose aspirin every day.  Taking vitamin and mineral supplements as recommended by your health care provider. What happens during an annual well check? The services and screenings done by your health care provider during your annual well check will depend on  your age, overall health, lifestyle risk factors, and family history of disease. Counseling  Your health care provider may ask you questions about your:  Alcohol use.  Tobacco use.  Drug use.  Emotional well-being.  Home and relationship well-being.  Sexual activity.  Eating habits.  History of falls.  Memory and ability to understand (cognition).  Work and work Statistician.  Reproductive health. Screening  You may have the following tests or measurements:  Height, weight, and BMI.  Blood pressure.  Lipid and cholesterol levels. These may be checked every 5 years, or more frequently if you are over 10 years old.  Skin check.  Lung cancer screening. You may have this screening every year starting at age 70 if you have a 30-pack-year history of smoking and currently smoke or have quit within the past 15 years.  Fecal occult blood test (FOBT) of the stool. You may have this test every year starting at age 48.  Flexible sigmoidoscopy or colonoscopy. You may have a sigmoidoscopy every 5 years or a colonoscopy every 10 years starting at age 47.  Hepatitis C blood test.  Hepatitis B blood test.  Sexually transmitted disease (STD) testing.  Diabetes screening. This is done by checking your blood sugar (glucose) after you have not eaten for a while (fasting). You may have this done every 1-3 years.  Bone density scan. This is done to screen for osteoporosis. You may have this done starting at age 67.  Mammogram. This may be done every 1-2 years. Talk to your health care provider about how often you should have regular mammograms. Talk with your health care provider about your test results, treatment options, and if necessary, the need for more tests.  Vaccines  Your health care provider may recommend certain vaccines, such as:  Influenza vaccine. This is recommended every year.  Tetanus, diphtheria, and acellular pertussis (Tdap, Td) vaccine. You may need a Td booster  every 10 years.  Zoster vaccine. You may need this after age 45.  Pneumococcal 13-valent conjugate (PCV13) vaccine. One dose is recommended after age 84.  Pneumococcal polysaccharide (PPSV23) vaccine. One dose is recommended after age 36. Talk to your health care provider about which screenings and vaccines you need and how often you need them. This information is not intended to replace advice given to you by your health care provider. Make sure you discuss any questions you have with your health care provider. Document Released: 04/06/2015 Document Revised: 11/28/2015 Document Reviewed: 01/09/2015 Elsevier Interactive Patient Education  2017 [employer] Prevention in the Home Falls can cause injuries. They can happen to people of all ages. There are many things you can do to make your home safe and to help prevent falls. What can I do on the outside of my home?  Regularly fix the edges of walkways and driveways and fix any cracks.  Remove anything that might make you trip as you walk through a door, such as a raised step or threshold.  Trim any bushes or trees on the path to your home.  Use bright outdoor lighting.  Clear any walking paths of anything that might make someone trip, such as rocks or tools.  Regularly check to see if handrails are loose or broken. Make sure that both sides of any steps have handrails.  Any raised decks and porches should have guardrails on the edges.  Have any leaves, snow, or ice cleared regularly.  Use sand or salt on walking paths during winter.  Clean up any spills in your garage right away. This includes oil or grease spills. What can I do in the bathroom?  Use night lights.  Install grab bars by the toilet and in the tub and shower. Do not use towel bars as grab bars.  Use non-skid mats or decals in the tub or shower.  If you need to sit down in the shower, use a plastic, non-slip stool.  Keep the floor dry. Clean up any  water that spills on the floor as soon as it happens.  Remove soap buildup in the tub or shower regularly.  Attach bath mats securely with double-sided non-slip rug tape.  Do not have throw rugs and other things on the floor that can make you trip. What can I do in the bedroom?  Use night lights.  Make sure that you have a light by your bed that is easy to reach.  Do not use any sheets or blankets that are too big for your bed. They should not hang down onto the floor.  Have a firm chair that has side arms. You can use this for support while you get dressed.  Do not have throw rugs and other things on the floor that can make you trip. What can I do in the kitchen?  Clean up any spills right away.  Avoid walking on wet floors.  Keep items that you use a lot in easy-to-reach places.  If you need to reach something above you, use a strong step stool that has a grab bar.  Keep electrical cords out of the way.  Do not use floor polish or wax that makes floors slippery. If you must use wax, use non-skid floor wax.  Do not have throw rugs and other things on the floor that can make you trip. What can I do with my stairs?  Do not leave any items on the stairs.  Make sure that there are handrails on both sides of the stairs and use them. Fix handrails that are broken or loose. Make sure that handrails are as long as the stairways.  Check any carpeting to make sure that it is firmly attached to the stairs. Fix any carpet that is loose or worn.  Avoid having throw rugs at the top or bottom of the stairs. If you do have throw rugs, attach them to the floor with carpet tape.  Make sure that you have a light switch at the top of the stairs and the bottom of the stairs. If you do not have them, ask someone to add them for you. What else can I do to help prevent falls?  Wear shoes that:  Do not have high heels.  Have rubber bottoms.  Are comfortable and fit you well.  Are closed  at the toe. Do not wear sandals.  If you use a stepladder:  Make sure that it is fully opened. Do not climb a closed stepladder.  Make sure that both sides of the stepladder are locked into place.  Ask someone to hold it for you, if possible.  Clearly mark and make sure that you can see:  Any grab bars or handrails.  First and last steps.  Where the edge of each step is.  Use tools that help you move around (mobility aids) if they are needed. These include:  Canes.  Walkers.  Scooters.  Crutches.  Turn on the lights when you go into a dark area. Replace any light bulbs as soon as they burn out.  Set up your furniture so you have a clear path. Avoid moving your furniture around.  If any of your floors are uneven, fix them.  If there are any pets around you, be aware of where they are.  Review your medicines with your doctor. Some medicines can make you feel dizzy. This can increase your chance of falling. Ask your doctor what other things that you can do to help prevent falls. This information is not intended to replace advice given to you by your health care provider. Make sure you discuss any questions you have with your health care provider. Document Released: 01/04/2009 Document Revised: 08/16/2015 Document Reviewed: 04/14/2014 Elsevier Interactive Patient Education  2017 [employer].

## 2020-01-31 NOTE — Progress Notes (Signed)
PCP notes:  Health Maintenance: Mammo- due Dexa- due   Abnormal Screenings: none   Patient concerns:  Urinary incontinence Numbness in feet   Nurse concerns: none   Next PCP appt.: 03/13/2020 @ 3:30 pm

## 2020-01-31 NOTE — Progress Notes (Signed)
Subjective:   Priscilla [name] is a 86 y.o. female who presents for Medicare Annual (Subsequent) preventive examination.  Review of Systems: N/A      I connected with the patient today by telephone and verified that I am speaking with the correct person using two identifiers. Location patient: home Location nurse: work Persons participating in the telephone visit: patient, nurse.   I discussed the limitations, risks, security and privacy concerns of performing an evaluation and management service by telephone and the availability of in person appointments. I also discussed with the patient that there may be a patient responsible charge related to this service. The patient expressed understanding and verbally consented to this telephonic visit.        Cardiac Risk Factors include: advanced age (>58men, >9 women);hypertension     Objective:    Today's Vitals   There is no height or weight on file to calculate BMI.  Advanced Directives 01/31/2020 07/28/2019 07/19/2019 10/28/2017 10/31/2014 05/20/2012 02/05/2011  Does Patient Have a Medical Advance Directive? Yes Yes Yes Yes No Patient has advance directive, copy not in chart Patient has advance directive, copy not in chart  Type of Advance Directive [place];Living will Living will;Healthcare Power of Attorney Living will;Healthcare Power of [place];Living will - - [place];Living will  Does patient want to make changes to medical advance directive? - No - Patient declined - - - - -  Copy of [place] in Chart? Yes - validated most recent copy scanned in chart (See row information) No - copy requested - Yes - - Copy requested from family  Would patient like information on creating a medical advance directive? - - - - No - patient declined information - -  Pre-existing out of facility DNR order (yellow form or pink MOST form) - - - - - - No    Current  Medications (verified) Outpatient Encounter Medications as of 01/31/2020  Medication Sig  . acetaminophen (TYLENOL) 500 MG tablet Take 500-1,000 mg by mouth every 6 (six) hours as needed for moderate pain or headache.  Marland Kitchen amLODipine (NORVASC) 2.5 MG tablet Take 1 tablet (2.5 mg total) by mouth daily.  Marland Kitchen aspirin EC 81 MG tablet Take 81 mg by mouth daily.  Marland Kitchen atenolol (TENORMIN) 25 MG tablet TAKE 1 TABLET BY MOUTH TWICE A DAY  . Biotin (BIOTIN 5000) 5 MG CAPS Take 5 mg by mouth at bedtime.   . Calcium Carb-Cholecalciferol (CALCIUM 600 + D PO) Take 1 tablet by mouth at bedtime.  . calcium carbonate (TUMS - DOSED IN MG ELEMENTAL CALCIUM) 500 MG chewable tablet Chew 1 tablet by mouth 2 (two) times daily as needed for indigestion or heartburn.  . D3-50 1.25 MG (50000 UT) capsule TAKE 1 CAPSULE BY MOUTH ONE TIME PER WEEK  . ferrous sulfate 325 (65 FE) MG tablet Take 325 mg by mouth daily with breakfast.  . gabapentin (NEURONTIN) 100 MG capsule Take 1 capsule (100 mg total) by mouth 3 (three) times daily.  . hydrALAZINE (APRESOLINE) 25 MG tablet TAKE 1 TABLET BY MOUTH TWICE A DAY AS NEEDED (BP>160/100) (Patient taking differently: Take 25 mg by mouth 2 (two) times daily as needed ((BP>160/100)). )  . hydrochlorothiazide (HYDRODIURIL) 25 MG tablet Take 1 tablet (25 mg total) by mouth daily.  . nitroGLYCERIN (NITROSTAT) 0.4 MG SL tablet Place 1 tablet (0.4 mg total) under the tongue every 5 (five) minutes as needed for chest  pain.  . valACYclovir (VALTREX) 1000 MG tablet Take 1 tablet (1,000 mg total) by mouth 3 (three) times daily.  . [DISCONTINUED] CALCIUM-VITAMIN D PO Take 1 tablet by mouth 2 (two) times daily.   No facility-administered encounter medications on file as of 01/31/2020.    Allergies (verified) Morphine and related, Lisinopril, and Losartan   History: Past Medical History:  Diagnosis Date  . Aortic insufficiency    mild to moderate on echo 2017  . B12 deficiency 03/25/2014   H/o small  bowel obstruction with resection as well as colorectal cancer s/p partial colectomy.  Receives B12 injections   . Cancer ([place]) 2001, 2008   colon, rectal  . Chest pain, atypical    no ischemia on nuclear stress test 2017  . History of chicken pox   . History of Clostridium difficile 2012  . History of DVT (deep vein thrombosis) 2002   right leg, after chemo for first cancer, treated  . Hypertension   . IDA (iron deficiency anemia)   . Open wound of abdominal wall with complication   . Osteoarthritis   . Osteopenia 09/2011   DEXA 2013 spine -2.2, femur -1.6, DEXA 12/2015 - hip T score -1.9  . SBO (small bowel obstruction) ([place]) 02/05/2011   On 02/05/11 she had enterolysis and small bowel resection    Past Surgical History:  Procedure Laterality Date  . ABDOMINAL HYSTERECTOMY  1984  . APPENDECTOMY  1984  . BOWEL RESECTION  02/05/2011   Procedure: SMALL BOWEL RESECTION;  Surgeon: Harl Bowie, MD;  Location: [facility];  Service: General;  Laterality: N/A;  . broken nose    . CARDIOVASCULAR STRESS TEST  11/2015   WNL ([place])  . COLECTOMY    . COLON SURGERY  2001 and 2008  . COLONOSCOPY  12/2012   erosions at colonic anastomosis, o/w normal rpt 5 yrs (Dr. Penelope Coop at [place])  . COLONOSCOPY  01/2018   WNL no rpt recommended ([place])  . COLOSTOMY    . DEXA  09/2011   T-score: spine -2.2, femur -1.6  . ESOPHAGOGASTRODUODENOSCOPY  01/31/2011   Procedure: ESOPHAGOGASTRODUODENOSCOPY (EGD);  Surgeon: Missy Sabins, MD;  Location: [facility] ENDOSCOPY;  Service: Endoscopy;  Laterality: N/A;  duodenal bx;s r/o giardia, r/o celiac diease  . EYE SURGERY  2018   cataract surgery  . EYE SURGERY  2019   laser surgery  . FLEXIBLE SIGMOIDOSCOPY  01/31/2011   Procedure: FLEXIBLE SIGMOIDOSCOPY;  Surgeon: Missy Sabins, MD;  Location: [facility];  Service: Endoscopy;  Laterality: N/A;  . ILEOSTOMY    . JOINT REPLACEMENT  2010   knee   . LAPAROTOMY  02/05/2011   Procedure: EXPLORATORY LAPAROTOMY;  Surgeon:  Harl Bowie, MD;  Location: [facility];  Service: General;  Laterality: N/A;  lysis of adhesions.  . NUCLEAR STRESS TEST  11/17/08   NORMAL  . REPLACEMENT TOTAL KNEE  2010   RIGHT KNEE  . REVERSE SHOULDER ARTHROPLASTY Right 07/28/2019   Procedure: REVERSE SHOULDER ARTHROPLASTY;  Surgeon: Justice Britain, MD;  Location: [facility];  Service: Orthopedics;  Laterality: Right;  exparel  . WOUND DEBRIDEMENT N/A 05/21/2012   Procedure: Excision non healing ABDOMINAL WOUND;  Surgeon: Harl Bowie, MD;  Location: [facility] OR;  Service: General;  Laterality: N/A;   Family History  Problem Relation Age of Onset  . Heart disease Mother        valve problems  . Heart attack Mother   . Congestive Heart Failure Mother   .  Heart disease Father        due to lupus  . Lupus Father   . Heart attack Father   . Colon cancer Paternal Grandmother   . Coronary artery disease Brother        s/p CABG  . Coronary artery disease Sister        s/p CABG  . Heart attack Brother   . Heart attack Sister   . Diabetes Neg Hx   . Stroke Neg Hx   . Hypertension Neg Hx    Social History   Socioeconomic History  . Marital status: Widowed    Spouse name: Not on file  . Number of children: Not on file  . Years of education: Not on file  . Highest education level: Not on file  Occupational History  . Not on file  Tobacco Use  . Smoking status: Never Smoker  . Smokeless tobacco: Never Used  Vaping Use  . Vaping Use: Never used  Substance and Sexual Activity  . Alcohol use: No  . Drug use: No  . Sexual activity: Not Currently  Other Topics Concern  . Not on file  Social History Narrative   Caffeine: decaf   Lives alone, daughter lives nearby to visit.   Occupation: retired, worked at [employer] office   Edu: HS   Activity: tries to get walking, walks up and down steps at home   Diet: good water, fruits/vegetables daily.  1 cup milk/day, cheese.      Cards: Dr. Mare Ferrari   Surgeon: Dr. Rush Farmer   Social  Determinants of Health   Financial Resource Strain: Low Risk   . Difficulty of Paying Living Expenses: Not hard at all  Food Insecurity: No Food Insecurity  . Worried About Charity fundraiser in the Last Year: Never true  . Ran Out of Food in the Last Year: Never true  Transportation Needs: No Transportation Needs  . Lack of Transportation (Medical): No  . Lack of Transportation (Non-Medical): No  Physical Activity: Inactive  . Days of Exercise per Week: 0 days  . Minutes of Exercise per Session: 0 min  Stress: No Stress Concern Present  . Feeling of Stress : Not at all  Social Connections:   . Frequency of Communication with Friends and Family: Not on file  . Frequency of Social Gatherings with Friends and Family: Not on file  . Attends Religious Services: Not on file  . Active Member of Clubs or Organizations: Not on file  . Attends Archivist Meetings: Not on file  . Marital Status: Not on file    Tobacco Counseling Counseling given: Not Answered   Clinical Intake:  Pre-visit preparation completed: Yes  Pain : 0-10 Pain Type: Chronic pain Pain Location: Shoulder Pain Orientation: Right Pain Descriptors / Indicators: Aching Pain Onset: More than a month ago Pain Frequency: Intermittent     Nutritional Risks: None Diabetes: No  How often do you need to have someone help you when you read instructions, pamphlets, or other written materials from your doctor or pharmacy?: 1 - Never What is the last grade level you completed in school?: 12th  Diabetic: No Nutrition Risk Assessment:  Has the patient had any N/V/D within the last 2 months?  No  Does the patient have any non-healing wounds?  No  Has the patient had any unintentional weight loss or weight gain?  No   Diabetes:  Is the patient diabetic?  No  If diabetic, was a  CBG obtained today?  N/A Did the patient bring in their glucometer from home?  N/A How often do you monitor your CBG's? N/A.    Financial Strains and Diabetes Management:  Are you having any financial strains with the device, your supplies or your medication? N/A.  Does the patient want to be seen by Chronic Care Management for management of their diabetes?  N/A Would the patient like to be referred to a Nutritionist or for Diabetic Management?  N/A     Interpreter Needed?: No  Information entered by :: CJohnson, LPN   Activities of Daily Living In your present state of health, do you have any difficulty performing the following activities: 01/31/2020 07/28/2019  Hearing? N N  Vision? N N  Difficulty concentrating or making decisions? N N  Walking or climbing stairs? N N  Dressing or bathing? N N  Doing errands, shopping? N N  Preparing Food and eating ? N -  Using the Toilet? N -  In the past six months, have you accidently leaked urine? Y -  Comment wears pads at night -  Do you have problems with loss of bowel control? N -  Managing your Medications? N -  Managing your Finances? N -  Housekeeping or managing your Housekeeping? N -  Some recent data might be hidden    Patient Care Team: Ria Bush, MD as PCP - General (Family Medicine)  Indicate any recent Medical Services you may have received from other than [facility] providers in the past year (date may be approximate).     Assessment:   This is a routine wellness examination for Yazmin.  Hearing/Vision screen  Hearing Screening   125Hz  250Hz  500Hz  1000Hz  2000Hz  3000Hz  4000Hz  6000Hz  8000Hz   Right ear:           Left ear:           Vision Screening Comments: Patient gets annual eye exams   Dietary issues and exercise activities discussed: Current Exercise Habits: The patient does not participate in regular exercise at present, Exercise limited by: None identified  Goals    . Patient Stated     Starting 10/28/2017, I will continue to take medications as prescribed.     . Patient Stated     01/31/2020, I will maintain and continue  medications as prescribed.       Depression Screen PHQ 2/9 Scores 01/31/2020 11/11/2018 10/28/2017 09/29/2016 09/21/2015 09/19/2014 09/12/2013  PHQ - 2 Score 0 0 0 0 0 0 0  PHQ- 9 Score 0 - 0 - - - -    Fall Risk Fall Risk  01/31/2020 11/11/2018 10/28/2017 09/29/2016 09/21/2015  Falls in the past year? 0 1 Yes Yes Yes  Comment - - foot caught on vine in garden causing patient to fall - -  Number falls in past yr: 0 1 1 1 2  or more  Injury with Fall? 0 1 No - No  Risk for fall due to : Medication side effect - - - -  Follow up Falls evaluation completed;Falls prevention discussed - - - -    Any stairs in or around the home? Yes  If so, are there any without handrails? No  Home free of loose throw rugs in walkways, pet beds, electrical cords, etc? Yes  Adequate lighting in your home to reduce risk of falls? Yes   ASSISTIVE DEVICES UTILIZED TO PREVENT FALLS:  Life alert? No  Use of a cane, walker or w/c? No  Grab bars  in the bathroom? No  Shower chair or bench in shower? Yes Elevated toilet seat or a handicapped toilet? No   TIMED UP AND GO:  Was the test performed? N/A, telephonic visit.   Cognitive Function: MMSE - Mini Mental State Exam 01/31/2020 10/28/2017  Orientation to time 5 5  Orientation to Place 5 5  Registration 3 3  Attention/ Calculation 5 0  Recall 3 3  Language- name 2 objects - 0  Language- repeat 1 1  Language- follow 3 step command - 3  Language- read & follow direction - 0  Write a sentence - 0  Copy design - 0  Total score - 20  Mini Cog  Mini-Cog screen was completed. Maximum score is 22. A value of 0 denotes this part of the MMSE was not completed or the patient failed this part of the Mini-Cog screening.       Immunizations Immunization History  Administered Date(s) Administered  . Fluad Quad(high Dose 65+) 12/23/2019  . Influenza Split 12/11/2011, 12/23/2014  . Influenza, High Dose Seasonal PF 01/12/2017, 01/27/2018  . Influenza,inj,Quad PF,6+ Mos  12/25/2015, 11/11/2018  . PFIZER SARS-COV-2 Vaccination 04/20/2019, 05/11/2019  . Pneumococcal Conjugate-13 09/12/2013  . Pneumococcal Polysaccharide-23 03/25/2007  . Td 09/11/2011  . Zoster 05/09/2014    TDAP status: Up to date Flu Vaccine status: Up to date Pneumococcal vaccine status: Up to date Covid-19 vaccine status: Completed vaccines  Qualifies for Shingles Vaccine? Yes   Zostavax completed Yes   Shingrix Completed?: No.    Education has been provided regarding the importance of this vaccine. Patient has been advised to call insurance company to determine out of pocket expense if they have not yet received this vaccine. Advised may also receive vaccine at local pharmacy or Health Dept. Verbalized acceptance and understanding.  Screening Tests Health Maintenance  Topic Date Due  . MAMMOGRAM  04/13/2019  . TETANUS/TDAP  09/10/2021  . COLONOSCOPY  01/26/2023  . INFLUENZA VACCINE  Completed  . DEXA SCAN  Completed  . COVID-19 Vaccine  Completed  . PNA vac Low Risk Adult  Completed    Health Maintenance  Health Maintenance Due  Topic Date Due  . MAMMOGRAM  04/13/2019    Colorectal cancer screening: Completed 01/25/2018. Repeat every 5 years Mammogram status:due , will discuss with provider at visit Bone Density status: due, will discuss with provider at visit   Lung Cancer Screening: (Low Dose CT Chest recommended if Age 65-80 years, 30 pack-year currently smoking OR have quit w/in 15 years.) does not qualify.    Additional Screening:  Hepatitis C Screening: does not qualify; Completed N/A  Vision Screening: Recommended annual ophthalmology exams for early detection of glaucoma and other disorders of the eye. Is the patient up to date with their annual eye exam?  Yes  Who is the provider or what is the name of the office in which the patient attends annual eye exams? Dr. Satira Sark If pt is not established with a provider, would they like to be referred to a provider  to establish care? No .   Dental Screening: Recommended annual dental exams for proper oral hygiene  Community Resource Referral / Chronic Care Management: CRR required this visit?  No   CCM required this visit?  No      Plan:     I have personally reviewed and noted the following in the patient's chart:   . Medical and social history . Use of alcohol, tobacco or illicit drugs  .  Current medications and supplements . Functional ability and status . Nutritional status . Physical activity . Advanced directives . List of other physicians . Hospitalizations, surgeries, and ER visits in previous 12 months . Vitals . Screenings to include cognitive, depression, and falls . Referrals and appointments  In addition, I have reviewed and discussed with patient certain preventive protocols, quality metrics, and best practice recommendations. A written personalized care plan for preventive services as well as general preventive health recommendations were provided to patient.   Due to this being a telephonic visit, the after visit summary with patients personalized plan was offered to patient via office or my-chart. Patient preferred to pick up at office at next visit or via mychart.   Andrez Grime, LPN   [id number]

## 2020-02-07 DIAGNOSIS — H43813 Vitreous degeneration, bilateral: Secondary | ICD-10-CM | POA: Diagnosis not present

## 2020-02-07 DIAGNOSIS — H04123 Dry eye syndrome of bilateral lacrimal glands: Secondary | ICD-10-CM | POA: Diagnosis not present

## 2020-02-07 DIAGNOSIS — H524 Presbyopia: Secondary | ICD-10-CM | POA: Diagnosis not present

## 2020-02-07 DIAGNOSIS — Z961 Presence of intraocular lens: Secondary | ICD-10-CM | POA: Diagnosis not present

## 2020-02-08 ENCOUNTER — Other Ambulatory Visit: Payer: Self-pay

## 2020-02-08 ENCOUNTER — Ambulatory Visit (INDEPENDENT_AMBULATORY_CARE_PROVIDER_SITE_OTHER): Payer: Medicare Other

## 2020-02-08 DIAGNOSIS — E538 Deficiency of other specified B group vitamins: Secondary | ICD-10-CM

## 2020-02-08 MED ORDER — CYANOCOBALAMIN 1000 MCG/ML IJ SOLN
1000.0000 ug | Freq: Once | INTRAMUSCULAR | Status: AC
  Administered 2020-02-08: 1000 ug via INTRAMUSCULAR

## 2020-02-08 NOTE — Progress Notes (Signed)
Per orders of Dr. Danise Mina, injection of B12, given by Aneta Mins, RN. Patient tolerated injection well in L Deltoid.

## 2020-02-15 ENCOUNTER — Encounter: Payer: Self-pay | Admitting: Cardiology

## 2020-02-15 ENCOUNTER — Ambulatory Visit (INDEPENDENT_AMBULATORY_CARE_PROVIDER_SITE_OTHER): Payer: Medicare Other | Admitting: Cardiology

## 2020-02-15 ENCOUNTER — Other Ambulatory Visit: Payer: Self-pay

## 2020-02-15 VITALS — BP 162/78 | HR 76 | Ht 59.5 in | Wt 154.0 lb

## 2020-02-15 DIAGNOSIS — I351 Nonrheumatic aortic (valve) insufficiency: Secondary | ICD-10-CM | POA: Diagnosis not present

## 2020-02-15 DIAGNOSIS — I340 Nonrheumatic mitral (valve) insufficiency: Secondary | ICD-10-CM

## 2020-02-15 DIAGNOSIS — I6523 Occlusion and stenosis of bilateral carotid arteries: Secondary | ICD-10-CM | POA: Diagnosis not present

## 2020-02-15 DIAGNOSIS — I10 Essential (primary) hypertension: Secondary | ICD-10-CM

## 2020-02-15 NOTE — Patient Instructions (Addendum)

## 2020-02-15 NOTE — Progress Notes (Signed)
Date:  02/15/2020   ID:  Priscilla [name], DOB 1930/12/16, MRN [id number]  PCP:  Ria Bush, MD  Cardiologist:  Fransico Him, MD Electrophysiologist:  None   Chief Complaint:  HTN and aortic insufficiency  History of Present Illness:    Priscilla [name] is a 86 y.o. female with a hx of HTN, moderate aortic insufficiency with mild mitral regurgitation.  Nuclear stress test 07/2017 for SOB showed no ischemia and 2D echo 2019 showed normal LVF with mild AI.  She is here today for followup and is doing well.  She has mild DOE when she walks but it is very stable.  She denies any chest pain or pressure, PND, orthopnea,  dizziness, palpitations or syncope. She has occasional left ankle swelling which is stable.   She is compliant with her meds and is tolerating meds with no SE.    Prior CV studies:   The following studies were reviewed today:  2D echo , lexiscan myoview  Past Medical History:  Diagnosis Date  . Aortic insufficiency    mild on echo 2020  . B12 deficiency 03/25/2014   H/o small bowel obstruction with resection as well as colorectal cancer s/p partial colectomy.  Receives B12 injections   . Cancer ([place]) 2001, 2008   colon, rectal  . Carotid stenosis, asymptomatic, bilateral    1-39% by dopplers 2020  . Chest pain, atypical    no ischemia on nuclear stress test 2019  . History of chicken pox   . History of Clostridium difficile 2012  . History of DVT (deep vein thrombosis) 2002   right leg, after chemo for first cancer, treated  . Hypertension   . IDA (iron deficiency anemia)   . Open wound of abdominal wall with complication   . Osteoarthritis   . Osteopenia 09/2011   DEXA 2013 spine -2.2, femur -1.6, DEXA 12/2015 - hip T score -1.9  . SBO (small bowel obstruction) ([place]) 02/05/2011   On 02/05/11 she had enterolysis and small bowel resection    Past Surgical History:  Procedure Laterality Date  . ABDOMINAL HYSTERECTOMY  1984  . APPENDECTOMY  1984  . BOWEL  RESECTION  02/05/2011   Procedure: SMALL BOWEL RESECTION;  Surgeon: Harl Bowie, MD;  Location: [facility];  Service: General;  Laterality: N/A;  . broken nose    . CARDIOVASCULAR STRESS TEST  11/2015   WNL ([place])  . COLECTOMY    . COLON SURGERY  2001 and 2008  . COLONOSCOPY  12/2012   erosions at colonic anastomosis, o/w normal rpt 5 yrs (Dr. Penelope Coop at [facility])  . COLONOSCOPY  01/2018   WNL no rpt recommended ([place])  . COLOSTOMY    . DEXA  09/2011   T-score: spine -2.2, femur -1.6  . ESOPHAGOGASTRODUODENOSCOPY  01/31/2011   Procedure: ESOPHAGOGASTRODUODENOSCOPY (EGD);  Surgeon: Missy Sabins, MD;  Location: [facility];  Service: Endoscopy;  Laterality: N/A;  duodenal bx;s r/o giardia, r/o celiac diease  . EYE SURGERY  2018   cataract surgery  . EYE SURGERY  2019   laser surgery  . FLEXIBLE SIGMOIDOSCOPY  01/31/2011   Procedure: FLEXIBLE SIGMOIDOSCOPY;  Surgeon: Missy Sabins, MD;  Location: [facility];  Service: Endoscopy;  Laterality: N/A;  . ILEOSTOMY    . JOINT REPLACEMENT  2010   knee   . LAPAROTOMY  02/05/2011   Procedure: EXPLORATORY LAPAROTOMY;  Surgeon: Harl Bowie, MD;  Location: [facility];  Service: General;  Laterality: N/A;  lysis of adhesions.  . NUCLEAR STRESS TEST  11/17/08   NORMAL  . REPLACEMENT TOTAL KNEE  2010   RIGHT KNEE  . REVERSE SHOULDER ARTHROPLASTY Right 07/28/2019   Procedure: REVERSE SHOULDER ARTHROPLASTY;  Surgeon: Justice Britain, MD;  Location: [facility];  Service: Orthopedics;  Laterality: Right;  exparel  . WOUND DEBRIDEMENT N/A 05/21/2012   Procedure: Excision non healing ABDOMINAL WOUND;  Surgeon: Harl Bowie, MD;  Location: [facility];  Service: General;  Laterality: N/A;     Current Meds  Medication Sig  . acetaminophen (TYLENOL) 500 MG tablet Take 500-1,000 mg by mouth every 6 (six) hours as needed for moderate pain or headache.  Marland Kitchen amLODipine (NORVASC) 2.5 MG tablet Take 1 tablet (2.5 mg total) by mouth daily.  Marland Kitchen aspirin EC 81 MG tablet  Take 81 mg by mouth daily.  Marland Kitchen atenolol (TENORMIN) 25 MG tablet TAKE 1 TABLET BY MOUTH TWICE A DAY  . Biotin (BIOTIN 5000) 5 MG CAPS Take 5 mg by mouth at bedtime.   . Calcium Carb-Cholecalciferol (CALCIUM 600 + D PO) Take 1 tablet by mouth at bedtime.  . calcium carbonate (TUMS - DOSED IN MG ELEMENTAL CALCIUM) 500 MG chewable tablet Chew 1 tablet by mouth 2 (two) times daily as needed for indigestion or heartburn.  . D3-50 1.25 MG (50000 UT) capsule TAKE 1 CAPSULE BY MOUTH ONE TIME PER WEEK  . ferrous sulfate 325 (65 FE) MG tablet Take 325 mg by mouth daily with breakfast.  . hydrALAZINE (APRESOLINE) 25 MG tablet TAKE 1 TABLET BY MOUTH TWICE A DAY AS NEEDED (BP>160/100) (Patient taking differently: Take 25 mg by mouth 2 (two) times daily as needed ((BP>160/100)). )  . hydrochlorothiazide (HYDRODIURIL) 25 MG tablet Take 1 tablet (25 mg total) by mouth daily.  . nitroGLYCERIN (NITROSTAT) 0.4 MG SL tablet Place 1 tablet (0.4 mg total) under the tongue every 5 (five) minutes as needed for chest pain.     Allergies:   Morphine and related, Lisinopril, and Losartan   Social History   Tobacco Use  . Smoking status: Never Smoker  . Smokeless tobacco: Never Used  Vaping Use  . Vaping Use: Never used  Substance Use Topics  . Alcohol use: No  . Drug use: No     Family Hx: The patient's family history includes Colon cancer in her paternal grandmother; Congestive Heart Failure in her mother; Coronary artery disease in her brother and sister; Heart attack in her brother, father, mother, and sister; Heart disease in her father and mother; Lupus in her father. There is no history of Diabetes, Stroke, or Hypertension.  ROS:   Please see the history of present illness.    All other systems reviewed and are negative.   Labs/Other Tests and Data Reviewed:    Recent Labs: 05/26/2019: ALT 15; Pro B Natriuretic peptide (BNP) 179.0 07/19/2019: Hemoglobin 11.6; Platelets 278 09/28/2019: BUN 15; Creatinine,  Ser 0.83; Potassium 4.1; Sodium 134; TSH 2.90   Recent Lipid Panel Lab Results  Component Value Date/Time   CHOL 191 11/04/2018 08:10 AM   TRIG 102.0 11/04/2018 08:10 AM   HDL 84.00 11/04/2018 08:10 AM   CHOLHDL 2 11/04/2018 08:10 AM   LDLCALC 86 11/04/2018 08:10 AM   LDLDIRECT 101.7 12/12/2010 08:46 AM    Wt Readings from Last 3 Encounters:  02/15/20 154 lb (69.9 kg)  09/29/19 153 lb (69.4 kg)  09/06/19 153 lb 8 oz (69.6 kg)     Objective:    Vital  Signs:  BP (!) 162/78   Pulse 76   Ht 4' 11.5" (1.511 m)   Wt 154 lb (69.9 kg)   SpO2 95%   BMI 30.58 kg/m   GEN: Well nourished, well developed in no acute distress HEENT: Normal NECK: No JVD; No carotid bruits LYMPHATICS: No lymphadenopathy CARDIAC:RRR, no murmurs, rubs, gallops RESPIRATORY:  Clear to auscultation without rales, wheezing or rhonchi  ABDOMEN: Soft, non-tender, non-distended MUSCULOSKELETAL:  No edema; No deformity  SKIN: Warm and dry NEUROLOGIC:  Alert and oriented x 3 PSYCHIATRIC:  Normal affect    ASSESSMENT & PLAN:    1.  Hypertension  -BP borderline controlled on exam today>>she has white coat HTN and she also drove by herself to the office -BP at home runs 145/61mmHg -continue amlodipine 2.5mg  daily, HCTZ 25mg  daily and Atenolol 25mg  daily  2.  Aortic insufficiency  -mild by echo 08/2018  3.  Mitral regurgitation  -this was trivial by echo 08/2018  4.  Bilateral carotid artery stenosis -mild 1-39% by dopplers 10/2018   Medication Adjustments/Labs and Tests Ordered: Current medicines are reviewed at length with the patient today.  Concerns regarding medicines are outlined above.  Tests Ordered: No orders of the defined types were placed in this encounter.  Medication Changes: No orders of the defined types were placed in this encounter.   Disposition:  Follow up in 1 year(s)  Signed, Fransico Him, MD  02/15/2020 10:03 AM    [facility]

## 2020-02-20 ENCOUNTER — Telehealth: Payer: Self-pay

## 2020-02-20 DIAGNOSIS — M25512 Pain in left shoulder: Secondary | ICD-10-CM | POA: Diagnosis not present

## 2020-02-20 DIAGNOSIS — Z96611 Presence of right artificial shoulder joint: Secondary | ICD-10-CM | POA: Diagnosis not present

## 2020-02-20 NOTE — Telephone Encounter (Signed)
[facility] Night - Client Nonclinical Telephone Record  [facility] Client [facility] Night - Client Client Site [place] - Night Contact Type Call Who Is Calling Patient / Member / Family / Caregiver Caller Name [place] Phone Number [phone] Patient Name Priscilla [name] Patient DOB [birth year] Call Type Message Only Information Provided Reason for Call Request for General Office Information Initial Comment Caller states that she had rescheduled her appt and has already gone. She got a message saying it is 12/13 and is wanting to make sure they had it canceled Disp. Time Disposition Final User 02/20/2020 5:06:27 PM General Information Provided Yes Juline Patch Call Closed By: Juline Patch Transaction Date/Time: 02/20/2020 5:03:15 PM (ET)

## 2020-03-03 ENCOUNTER — Other Ambulatory Visit: Payer: Self-pay | Admitting: Family Medicine

## 2020-03-03 DIAGNOSIS — D539 Nutritional anemia, unspecified: Secondary | ICD-10-CM

## 2020-03-03 DIAGNOSIS — I10 Essential (primary) hypertension: Secondary | ICD-10-CM

## 2020-03-03 DIAGNOSIS — E559 Vitamin D deficiency, unspecified: Secondary | ICD-10-CM

## 2020-03-03 DIAGNOSIS — E538 Deficiency of other specified B group vitamins: Secondary | ICD-10-CM

## 2020-03-05 ENCOUNTER — Other Ambulatory Visit: Payer: Self-pay | Admitting: Family Medicine

## 2020-03-05 ENCOUNTER — Ambulatory Visit: Payer: Medicare Other

## 2020-03-06 ENCOUNTER — Other Ambulatory Visit: Payer: Self-pay

## 2020-03-06 ENCOUNTER — Other Ambulatory Visit (INDEPENDENT_AMBULATORY_CARE_PROVIDER_SITE_OTHER): Payer: Medicare Other

## 2020-03-06 DIAGNOSIS — E538 Deficiency of other specified B group vitamins: Secondary | ICD-10-CM

## 2020-03-06 DIAGNOSIS — I10 Essential (primary) hypertension: Secondary | ICD-10-CM | POA: Diagnosis not present

## 2020-03-06 DIAGNOSIS — D539 Nutritional anemia, unspecified: Secondary | ICD-10-CM | POA: Diagnosis not present

## 2020-03-06 DIAGNOSIS — E559 Vitamin D deficiency, unspecified: Secondary | ICD-10-CM | POA: Diagnosis not present

## 2020-03-06 LAB — COMPREHENSIVE METABOLIC PANEL
ALT: 17 U/L (ref 0–35)
AST: 20 U/L (ref 0–37)
Albumin: 4.3 g/dL (ref 3.5–5.2)
Alkaline Phosphatase: 74 U/L (ref 39–117)
BUN: 29 mg/dL — ABNORMAL HIGH (ref 6–23)
CO2: 29 mEq/L (ref 19–32)
Calcium: 9.6 mg/dL (ref 8.4–10.5)
Chloride: 99 mEq/L (ref 96–112)
Creatinine, Ser: 0.97 mg/dL (ref 0.40–1.20)
GFR: 52.65 mL/min — ABNORMAL LOW (ref >60.00)
Glucose, Bld: 92 mg/dL (ref 70–99)
Potassium: 4.2 mEq/L (ref 3.5–5.1)
Sodium: 136 mEq/L (ref 135–145)
Total Bilirubin: 0.6 mg/dL (ref 0.2–1.2)
Total Protein: 7 g/dL (ref 6.0–8.3)

## 2020-03-06 LAB — CBC WITH DIFFERENTIAL/PLATELET
Basophils Absolute: 0 10*3/uL (ref 0.0–0.1)
Basophils Relative: 0.7 % (ref 0.0–3.0)
Eosinophils Absolute: 0.2 10*3/uL (ref 0.0–0.7)
Eosinophils Relative: 3.4 % (ref 0.0–5.0)
HCT: 36.2 % (ref 36.0–46.0)
Hemoglobin: 12.1 g/dL (ref 12.0–15.0)
Lymphocytes Relative: 20.8 % (ref 12.0–46.0)
Lymphs Abs: 1.4 10*3/uL (ref 0.7–4.0)
MCHC: 33.6 g/dL (ref 30.0–36.0)
MCV: 90.1 fl (ref 78.0–100.0)
Monocytes Absolute: 0.8 10*3/uL (ref 0.1–1.0)
Monocytes Relative: 12.7 % — ABNORMAL HIGH (ref 3.0–12.0)
Neutro Abs: 4.1 10*3/uL (ref 1.4–7.7)
Neutrophils Relative %: 62.4 % (ref 43.0–77.0)
Platelets: 290 10*3/uL (ref 150.0–400.0)
RBC: 4.01 Mil/uL (ref 3.87–5.11)
RDW: 12.6 % (ref 11.5–15.5)
WBC: 6.5 10*3/uL (ref 4.0–10.5)

## 2020-03-06 LAB — LIPID PANEL
Cholesterol: 199 mg/dL (ref 0–200)
HDL: 86.9 mg/dL (ref >39.00)
LDL Cholesterol: 83 mg/dL (ref 0–99)
NonHDL: 112.02
Total CHOL/HDL Ratio: 2
Triglycerides: 145 mg/dL (ref 0.0–149.0)
VLDL: 29 mg/dL (ref 0.0–40.0)

## 2020-03-06 LAB — VITAMIN D 25 HYDROXY (VIT D DEFICIENCY, FRACTURES): VITD: 70.33 ng/mL (ref 30.00–100.00)

## 2020-03-06 LAB — VITAMIN B12: Vitamin B-12: 413 pg/mL (ref 211–911)

## 2020-03-13 ENCOUNTER — Encounter: Payer: Self-pay | Admitting: Family Medicine

## 2020-03-13 ENCOUNTER — Other Ambulatory Visit: Payer: Self-pay

## 2020-03-13 ENCOUNTER — Ambulatory Visit (INDEPENDENT_AMBULATORY_CARE_PROVIDER_SITE_OTHER): Payer: Medicare Other | Admitting: Family Medicine

## 2020-03-13 VITALS — BP 143/54 | HR 94 | Temp 97.8°F | Ht 59.75 in | Wt 153.2 lb

## 2020-03-13 DIAGNOSIS — E538 Deficiency of other specified B group vitamins: Secondary | ICD-10-CM | POA: Diagnosis not present

## 2020-03-13 DIAGNOSIS — G6289 Other specified polyneuropathies: Secondary | ICD-10-CM

## 2020-03-13 DIAGNOSIS — R0789 Other chest pain: Secondary | ICD-10-CM | POA: Diagnosis not present

## 2020-03-13 DIAGNOSIS — R351 Nocturia: Secondary | ICD-10-CM | POA: Insufficient documentation

## 2020-03-13 DIAGNOSIS — E559 Vitamin D deficiency, unspecified: Secondary | ICD-10-CM | POA: Diagnosis not present

## 2020-03-13 DIAGNOSIS — M85859 Other specified disorders of bone density and structure, unspecified thigh: Secondary | ICD-10-CM

## 2020-03-13 DIAGNOSIS — I6523 Occlusion and stenosis of bilateral carotid arteries: Secondary | ICD-10-CM | POA: Diagnosis not present

## 2020-03-13 DIAGNOSIS — R079 Chest pain, unspecified: Secondary | ICD-10-CM | POA: Diagnosis not present

## 2020-03-13 DIAGNOSIS — Z85038 Personal history of other malignant neoplasm of large intestine: Secondary | ICD-10-CM

## 2020-03-13 DIAGNOSIS — M25532 Pain in left wrist: Secondary | ICD-10-CM

## 2020-03-13 DIAGNOSIS — N3941 Urge incontinence: Secondary | ICD-10-CM | POA: Insufficient documentation

## 2020-03-13 LAB — POC URINALSYSI DIPSTICK (AUTOMATED)
Bilirubin, UA: NEGATIVE
Blood, UA: NEGATIVE
Glucose, UA: NEGATIVE
Ketones, UA: NEGATIVE
Nitrite, UA: NEGATIVE
Protein, UA: POSITIVE — AB
Spec Grav, UA: 1.015 (ref 1.010–1.025)
Urobilinogen, UA: 0.2 E.U./dL
pH, UA: 7 (ref 5.0–8.0)

## 2020-03-13 MED ORDER — OXYBUTYNIN CHLORIDE 5 MG PO TABS: 5.0000 mg | ORAL_TABLET | Freq: Every day | ORAL | 3 refills | Status: DC

## 2020-03-13 MED ORDER — CYANOCOBALAMIN 1000 MCG/ML IJ SOLN
1000.0000 ug | Freq: Once | INTRAMUSCULAR | Status: AC
  Administered 2020-03-13: 16:00:00 1000 ug via INTRAMUSCULAR

## 2020-03-13 NOTE — Patient Instructions (Addendum)
b12 shot today.  Call to schedule mammogram at your convenience: [facility] in [place] [phone] Schedule on same day as bone density scan.  If interested, check with pharmacy about new 2 shot shingles series (shingrix).  For urine symptoms - urinalysis today. Start oxybutynin 5mg  nightly to help with bladder spasm. Update me with effect.  For left wrist - may try over the counter voltaren gel as needed. Next step is likely xray. Let us know if interested.  I think you have peripheral neuropathy of the feet.  For chest pain - update EKG today. Seek care if recurrent symptoms.  Return in 6 months for follow up visit.   Health Maintenance After Age 29 After age 55, you are at a higher risk for certain long-term diseases and infections as well as injuries from falls. Falls are a major cause of broken bones and head injuries in people who are older than age 74. Getting regular preventive care can help to keep you healthy and well. Preventive care includes getting regular testing and making lifestyle changes as recommended by your health care provider. Talk with your health care provider about:  Which screenings and tests you should have. A screening is a test that checks for a disease when you have no symptoms.  A diet and exercise plan that is right for you. What should I know about screenings and tests to prevent falls? Screening and testing are the best ways to find a health problem early. Early diagnosis and treatment give you the best chance of managing medical conditions that are common after age 58. Certain conditions and lifestyle choices may make you more likely to have a fall. Your health care provider may recommend:  Regular vision checks. Poor vision and conditions such as cataracts can make you more likely to have a fall. If you wear glasses, make sure to get your prescription updated if your vision changes.  Medicine review. Work with your health care provider to  regularly review all of the medicines you are taking, including over-the-counter medicines. Ask your health care provider about any side effects that may make you more likely to have a fall. Tell your health care provider if any medicines that you take make you feel dizzy or sleepy.  Osteoporosis screening. Osteoporosis is a condition that causes the bones to get weaker. This can make the bones weak and cause them to break more easily.  Blood pressure screening. Blood pressure changes and medicines to control blood pressure can make you feel dizzy.  Strength and balance checks. Your health care provider may recommend certain tests to check your strength and balance while standing, walking, or changing positions.  Foot health exam. Foot pain and numbness, as well as not wearing proper footwear, can make you more likely to have a fall.  Depression screening. You may be more likely to have a fall if you have a fear of falling, feel emotionally low, or feel unable to do activities that you used to do.  Alcohol use screening. Using too much alcohol can affect your balance and may make you more likely to have a fall. What actions can I take to lower my risk of falls? General instructions  Talk with your health care provider about your risks for falling. Tell your health care provider if: ? You fall. Be sure to tell your health care provider about all falls, even ones that seem minor. ? You feel dizzy, sleepy, or off-balance.  Take over-the-counter and prescription medicines  only as told by your health care provider. These include any supplements.  Eat a healthy diet and maintain a healthy weight. A healthy diet includes low-fat dairy products, low-fat (lean) meats, and fiber from whole grains, beans, and lots of fruits and vegetables. Home safety  Remove any tripping hazards, such as rugs, cords, and clutter.  Install safety equipment such as grab bars in bathrooms and safety rails on  stairs.  Keep rooms and walkways well-lit. Activity   Follow a regular exercise program to stay fit. This will help you maintain your balance. Ask your health care provider what types of exercise are appropriate for you.  If you need a cane or walker, use it as recommended by your health care provider.  Wear supportive shoes that have nonskid soles. Lifestyle  Do not drink alcohol if your health care provider tells you not to drink.  If you drink alcohol, limit how much you have: ? 0-1 drink a day for women. ? 0-2 drinks a day for men.  Be aware of how much alcohol is in your drink. In the [place] one drink equals one typical bottle of beer (12 oz), one-half glass of wine (5 oz), or one shot of hard liquor (1 oz).  Do not use any products that contain nicotine or tobacco, such as cigarettes and e-cigarettes. If you need help quitting, ask your health care provider. Summary  Having a healthy lifestyle and getting preventive care can help to protect your health and wellness after age 39.  Screening and testing are the best way to find a health problem early and help you avoid having a fall. Early diagnosis and treatment give you the best chance for managing medical conditions that are more common for people who are older than age 57.  Falls are a major cause of broken bones and head injuries in people who are older than age 56. Take precautions to prevent a fall at home.  Work with your health care provider to learn what changes you can make to improve your health and wellness and to prevent falls. This information is not intended to replace advice given to you by your health care provider. Make sure you discuss any questions you have with your health care provider. Document Revised: 07/01/2018 Document Reviewed: 01/21/2017 Elsevier Patient Education  2020 [employer].

## 2020-03-13 NOTE — Progress Notes (Signed)
Patient ID: Priscilla [name], female    DOB: [birth year], 86 y.o.   MRN: [id number]  This visit was conducted in person.  BP (!) 143/54 Comment: using home BP cuff   Pulse 94    Temp 97.8 F (36.6 C) (Temporal)    Ht 4' 11.75" (1.518 m)    Wt 153 lb 3 oz (69.5 kg)    SpO2 97%    BMI 30.17 kg/m   Home reading today 143/54.  BP Readings from Last 3 Encounters:  03/13/20 (!) 143/54  02/15/20 (!) 162/78  09/29/19 (!) 142/70  Elevated on repeat testing today (200/70)  CC: CPE Subjective:   HPI: Priscilla [name] is a 86 y.o. female presenting on 03/13/2020 for Annual Exam (Prt 2. )   Saw health advisor last month for medicare wellness visit. Note reviewed.    No exam data present  Flowsheet Row Clinical Support from 01/31/2020 in [facility] [place]  PHQ-2 Total Score 0      Fall Risk  01/31/2020 11/11/2018 10/28/2017 09/29/2016 09/21/2015  Falls in the past year? 0 1 Yes Yes Yes  Comment - - foot caught on vine in garden causing patient to fall - -  Number falls in past yr: 0 1 1 1 2  or more  Injury with Fall? 0 1 No - No  Risk for fall due to : Medication side effect - - - -  Follow up Falls evaluation completed;Falls prevention discussed - - - -   Recent episodes of continuous chest pain with radiation across sides to back, lasting up to 1 hour - managed with nitro with benefit. Last episode was last week. Last saw Dr last month. No dyspnea or dizziness with this.   BP elevated today - states home readings much better, sometimes low.  Continues amlodipine 2.5mg  daily, hctz 25mg  daily and atenolol 25mg  daily. She's only been taking amlodipine 1/2 tab daily.   H/o colon cancer. Has had several abdominal surgeries - hysterectomy /appendectomy 1984, colon surgery for cancer 2001, 2008 (with ileostomy), bowel resection 2012, and prolonged non healing wound s/p debridement 2014).  She has h/o intestinal obstruction from adhesions, needed prolonged hospitalization and  surgery 2012.  R shoulder replacement surgery (07/2019) complicated by easy R humeral fracture while doing home PT after surgery.   Ongoing L wrist pain and swelling for years. No h/o gout. No redness or warmth of wrist.   Bilateral feet stay numb. No burning pain. No alcohol intake. No h/o DM. No fmhx periph neuropathy. Continues vit B12 IM injections.  Lab Results  Component Value Date   VITAMINB12 413 03/06/2020     Preventative: H/o colon and rectal cancer. H/o bowel resection after SBO.  COLONOSCOPY Date: 12/2012 erosions at colonic anastomosis, o/w normal rpt 5 yrs (Dr. 10[birth year]1971 at [facility]).  COLONOSCOPY 01/2018 - WNL no rpt recommended ([facility])  No abdominal pain.  Well woman - aged out of pap smears.  Mammogram -03/2018 WNL. Discussed Q18-24 month mammo - would be due.  DEXA 12/2015 - hip T score -1.9. agrees to repeat given recent humeral fracture.  Flu - yearly COVID vaccine Pfizer 03/2019, 04/2019, 12/2019  Td - 08/2011  Peumovax 2009. prevnar 08/2013 zostavax 04/2014 shingrix - discussed. Advanced directives - copy in chart 08/2015. HCPOA would be son 09/2013) and daughter 05/2014), also would be ok with other children helping. Ok with CPR, intubation and limited trial for reversible conditions, does not want prolonged life support if  terminal condition. Seat belt use discussed Sunscreen use discussed. Saw derm for moles on face recently (1 was skin cancer).  Non smoker Alcohol - none Dentist - yearly Eye exam - yearly - dry eyes but no glaucoma  Bowel - no constipation, chronic diarrhea x 3-4 after GI surgeries Bladder - notes increasing incontinence over months - nocturia x3-4, with accidents at night. No stress incontinence. No daytime urgency. No dysuria, fevers/chills, blood in urin, nausea, or flank pain.  Caffeine: decaf  Lives alone, daughter lives nearby to visit. Widow for 16 yrs  Occupation: retired, worked at [employer] office  Edu: HS  Activity: yardwork, walks up  and down steps at home  Diet: good water, fruits/vegetables daily. 1 cup milk/day, cheese     Relevant past medical, surgical, family and social history reviewed and updated as indicated. Interim medical history since our last visit reviewed. Allergies and medications reviewed and updated. Outpatient Medications Prior to Visit  Medication Sig Dispense Refill   acetaminophen (TYLENOL) 500 MG tablet Take 500-1,000 mg by mouth every 6 (six) hours as needed for moderate pain or headache.     amLODipine (NORVASC) 2.5 MG tablet Take 1 tablet (2.5 mg total) by mouth daily. (Patient taking differently: Take 2.5 mg by mouth daily. Takes 1/2 tablet) 90 tablet 1   aspirin EC 81 MG tablet Take 81 mg by mouth daily.     atenolol (TENORMIN) 25 MG tablet TAKE 1 TABLET BY MOUTH TWICE A DAY 180 tablet 1   Biotin 5 MG CAPS Take 5 mg by mouth at bedtime.      Calcium Carb-Cholecalciferol (CALCIUM 600 + D PO) Take 1 tablet by mouth at bedtime.     calcium carbonate (TUMS - DOSED IN MG ELEMENTAL CALCIUM) 500 MG chewable tablet Chew 1 tablet by mouth 2 (two) times daily as needed for indigestion or heartburn.     D3-50 1.25 MG (50000 UT) capsule TAKE 1 CAPSULE BY MOUTH ONE TIME PER WEEK 12 capsule 0   ferrous sulfate 325 (65 FE) MG tablet Take 325 mg by mouth daily with breakfast.     hydrALAZINE (APRESOLINE) 25 MG tablet TAKE 1 TABLET BY MOUTH TWICE A DAY AS NEEDED (BP>160/100) (Patient taking differently: Take 25 mg by mouth 2 (two) times daily as needed ((BP>160/100)).) 30 tablet 0   hydrochlorothiazide (HYDRODIURIL) 25 MG tablet Take 1 tablet (25 mg total) by mouth daily. 90 tablet 3   nitroGLYCERIN (NITROSTAT) 0.4 MG SL tablet Place 1 tablet (0.4 mg total) under the tongue every 5 (five) minutes as needed for chest pain. 25 tablet 1   No facility-administered medications prior to visit.     Per HPI unless specifically indicated in ROS section below Review of Systems Objective:  BP (!) 143/54  Comment: using home BP cuff   Pulse 94    Temp 97.8 F (36.6 C) (Temporal)    Ht 4' 11.75" (1.518 m)    Wt 153 lb 3 oz (69.5 kg)    SpO2 97%    BMI 30.17 kg/m   Wt Readings from Last 3 Encounters:  03/13/20 153 lb 3 oz (69.5 kg)  02/15/20 154 lb (69.9 kg)  09/29/19 153 lb (69.4 kg)      Physical Exam Vitals and nursing note reviewed.  Constitutional:      General: She is not in acute distress.    Appearance: Normal appearance. She is well-developed and well-nourished. She is not ill-appearing.  HENT:     Head: Normocephalic  and atraumatic.     Right Ear: Hearing normal.     Left Ear: Hearing normal.     Mouth/Throat:     Mouth: Oropharynx is clear and moist and mucous membranes are normal.     Pharynx: Uvula midline. No posterior oropharyngeal edema.  Eyes:     General: No scleral icterus.    Extraocular Movements: Extraocular movements intact and EOM normal.     Conjunctiva/sclera: Conjunctivae normal.     Pupils: Pupils are equal, round, and reactive to light.  Cardiovascular:     Rate and Rhythm: Normal rate and regular rhythm.     Pulses: Normal pulses and intact distal pulses.          Radial pulses are 2+ on the right side and 2+ on the left side.     Heart sounds: Normal heart sounds. No murmur heard.   Pulmonary:     Effort: Pulmonary effort is normal. No respiratory distress.     Breath sounds: Normal breath sounds. No wheezing, rhonchi or rales.  Abdominal:     General: There is no distension.     Palpations: Abdomen is soft. There is no mass.     Tenderness: There is no abdominal tenderness. There is no guarding or rebound.     Hernia: No hernia is present.  Musculoskeletal:        General: No edema. Normal range of motion.     Cervical back: Normal range of motion and neck supple.     Right lower leg: No edema.     Left lower leg: No edema.  Lymphadenopathy:     Cervical: No cervical adenopathy.  Skin:    General: Skin is warm and dry.     Findings: No  rash.  Neurological:     General: No focal deficit present.     Mental Status: She is alert and oriented to person, place, and time.     Comments: CN grossly intact, station and gait intact  Psychiatric:        Mood and Affect: Mood and affect and mood normal.        Behavior: Behavior normal.        Thought Content: Thought content normal.        Judgment: Judgment normal.       Results for orders placed or performed in visit on 03/13/20  Urine Culture   Specimen: Urine  Result Value Ref Range   MICRO NUMBER: [id number]    SPECIMEN QUALITY: Adequate    Sample Source NOT GIVEN    STATUS: FINAL    ISOLATE 1: Escherichia coli (A)       Susceptibility   Escherichia coli - URINE CULTURE, REFLEX    AMOX/CLAVULANIC 4 Sensitive     AMPICILLIN 4 Sensitive     AMPICILLIN/SULBACTAM <=2 Sensitive     CEFAZOLIN* <=4 Not Reportable      * For infections other than uncomplicated UTIcaused by E. coli, K. pneumoniae or P. mirabilis:Cefazolin is resistant if MIC > or = 8 mcg/mL.(Distinguishing susceptible versus intermediatefor isolates with MIC < or = 4 mcg/mL requiresadditional testing.)For uncomplicated UTI caused by E. coli,K. pneumoniae or P. mirabilis: Cefazolin issusceptible if MIC <32 mcg/mL and predictssusceptible to the oral agents cefaclor, cefdinir,cefpodoxime, cefprozil, cefuroxime, cephalexinand loracarbef.    CEFEPIME <=1 Sensitive     CEFTRIAXONE <=1 Sensitive     CIPROFLOXACIN <=0.25 Sensitive     LEVOFLOXACIN <=0.12 Sensitive     ERTAPENEM <=0.5 Sensitive  GENTAMICIN <=1 Sensitive     IMIPENEM <=0.25 Sensitive     NITROFURANTOIN <=16 Sensitive     PIP/TAZO <=4 Sensitive     TOBRAMYCIN <=1 Sensitive     TRIMETH/SULFA* <=20 Sensitive      * For infections other than uncomplicated UTIcaused by E. coli, K. pneumoniae or P. mirabilis:Cefazolin is resistant if MIC > or = 8 mcg/mL.(Distinguishing susceptible versus intermediatefor isolates with MIC < or = 4 mcg/mL  requiresadditional testing.)For uncomplicated UTI caused by E. coli,K. pneumoniae or P. mirabilis: Cefazolin issusceptible if MIC <32 mcg/mL and predictssusceptible to the oral agents cefaclor, cefdinir,cefpodoxime, cefprozil, cefuroxime, cephalexinand loracarbef.Legend:S = Susceptible  I = IntermediateR = Resistant  NS = Not susceptible* = Not tested  NR = Not reported**NN = See antimicrobic comments  POCT Urinalysis Dipstick (Automated)  Result Value Ref Range   Color, UA light yellow    Clarity, UA clear    Glucose, UA Negative Negative   Bilirubin, UA negative    Ketones, UA negative    Spec Grav, UA 1.015 1.010 - 1.025   Blood, UA negative    pH, UA 7.0 5.0 - 8.0   Protein, UA Positive (A) Negative   Urobilinogen, UA 0.2 0.2 or 1.0 E.U./dL   Nitrite, UA negative    Leukocytes, UA Large (3+) (A) Negative  Micro:  WBC 1-5  RBC few  Bact 2+  Casts none  Epi few  UCx sent  EKG - NSR rate 70, slight LAD, mildly prolonged PR, good R wave progression, no acute ST/T changes  Assessment & Plan:  This visit occurred during the SARS-CoV-2 public health emergency.  Safety protocols were in place, including screening questions prior to the visit, additional usage of staff PPE, and extensive cleaning of exam room while observing appropriate contact time as indicated for disinfecting solutions.   Problem List Items Addressed This Visit    Vitamin D deficiency    Levels repleted - continue vit D 50,000 IU weekly.       Vitamin B12 deficiency - Primary    b12 shot today - continue monthly b12 shots.       Peripheral neuropathy    No alcohol intake. Not diabetic. B12 level stable on monthly replacement.  Unclear cause of neuropathy. No neuropathic pain - no indication for gabapentin.  Consider folate level next labs. Continue to monitor, consider NCS if worsening.       Osteopenia    Latest dexa 2017.  Concern for recent fragility fracture during PT after R shoulder replacement surgery  - will update DEXA scan.       Nocturia    Nocturia x3-4 at night with urinary accidents.  Trial oxybutynin 5mg  nightly.  Check UA today - ?sterile pyuria. Check UCx.       Relevant Orders   POCT Urinalysis Dipstick (Automated) (Completed)   Urine Culture (Completed)   Left wrist pain    Chronic - with evidence of hypertrophy anticipate OA related. Discussed voltaren gel use.  Not consistent with gout.       History of colon cancer    Latest colonoscopy 2019 WNL.       Essential hypertension    BP mildly elevated but overall adequate.  Continue amlodipine 2.5mg  (had been taking 1/2 tab - advised take full 2.5mg  tab), atenolol 25mg  bid, hctz 25mg  daily, and hydralazine 25mg  BID PRN BP> 160/100.       Carotid stenosis, asymptomatic, bilateral    Mild. Latest [place] 10/2018. No  bruits appreciated today. Will continue to monitor.       Atypical chest pain    Describes atypical chest pain - continuous to chest radiating to sides, can last 1 hr. Not exertional or relieved by rest. Recent cardiac eval reassuring. Check EKG today.           Meds ordered this encounter  Medications   cyanocobalamin ((VITAMIN B-12)) injection 1,000 mcg   oxybutynin (DITROPAN) 5 MG tablet    Sig: Take 1 tablet (5 mg total) by mouth at bedtime.    Dispense:  30 tablet    Refill:  3   Orders Placed This Encounter  Procedures   Urine Culture   POCT Urinalysis Dipstick (Automated)   EKG 12-Lead    Patient instructions: b12 shot today.  Call to schedule mammogram at your convenience: [facility] in [place] [phone] Schedule on same day as bone density scan.  If interested, check with pharmacy about new 2 shot shingles series (shingrix).  For urine symptoms - urinalysis today. Start oxybutynin 5mg  nightly to help with bladder spasm. Update me with effect.  For left wrist - may try over the counter voltaren gel as needed. Next step is likely xray. Let know if interested.  I  think you have peripheral neuropathy of the feet.  For chest pain - update EKG today. Seek care if recurrent symptoms.  Return in 6 months for follow up visit.   Follow up plan: Return in about 6 months (around 09/11/2020) for follow up visit.  09/13/2020, MD

## 2020-03-15 LAB — URINE CULTURE
MICRO NUMBER:: 11343369
SPECIMEN QUALITY:: ADEQUATE

## 2020-03-16 ENCOUNTER — Other Ambulatory Visit: Payer: Self-pay | Admitting: Family Medicine

## 2020-03-16 DIAGNOSIS — M25532 Pain in left wrist: Secondary | ICD-10-CM | POA: Insufficient documentation

## 2020-03-16 MED ORDER — NITROFURANTOIN MONOHYD MACRO 100 MG PO CAPS: 100.0000 mg | ORAL_CAPSULE | Freq: Two times a day (BID) | ORAL | 0 refills | Status: DC

## 2020-03-16 NOTE — Assessment & Plan Note (Signed)
BP mildly elevated but overall adequate.  Continue amlodipine 2.5mg (had been taking 1/2 tab - advised take full 2.5mg tab), atenolol 25mg bid, hctz 25mg daily, and hydralazine 25mg BID PRN BP> 160/100.  

## 2020-03-16 NOTE — Assessment & Plan Note (Addendum)
Chronic - with evidence of hypertrophy anticipate OA related. Discussed voltaren gel use.  Not consistent with gout.

## 2020-03-16 NOTE — Assessment & Plan Note (Signed)
Mild. Latest [place] 10/2018. No bruits appreciated today. Will continue to monitor.

## 2020-03-16 NOTE — Assessment & Plan Note (Signed)
Levels repleted - continue vit D 50,000 IU weekly.

## 2020-03-16 NOTE — Assessment & Plan Note (Addendum)
Latest dexa 2017.  Concern for recent fragility fracture during PT after R shoulder replacement surgery - will update DEXA scan.

## 2020-03-16 NOTE — Assessment & Plan Note (Signed)
Nocturia x3-4 at night with urinary accidents.  Trial oxybutynin 5mg  nightly.  Check UA today - ?sterile pyuria. Check UCx.

## 2020-03-16 NOTE — Assessment & Plan Note (Signed)
No alcohol intake. Not diabetic. B12 level stable on monthly replacement.  Unclear cause of neuropathy. No neuropathic pain - no indication for gabapentin.  Consider folate level next labs. Continue to monitor, consider NCS if worsening.

## 2020-03-16 NOTE — Assessment & Plan Note (Signed)
Latest colonoscopy 2019 WNL.

## 2020-03-16 NOTE — Assessment & Plan Note (Deleted)
BP mildly elevated but overall adequate.  Continue amlodipine 2.5mg  (had been taking 1/2 tab - advised take full 2.5mg  tab), atenolol 25mg  bid, hctz 25mg  daily, and hydralazine 25mg  BID PRN BP> 160/100.

## 2020-03-16 NOTE — Assessment & Plan Note (Signed)
Describes atypical chest pain - continuous to chest radiating to sides, can last 1 hr. Not exertional or relieved by rest. Recent cardiac eval reassuring. Check EKG today.

## 2020-03-16 NOTE — Assessment & Plan Note (Signed)
b12 shot today - continue monthly b12 shots.

## 2020-03-16 NOTE — Progress Notes (Signed)
UCx growing pansens E coli anticipate asxs bacteriuria (colonization) however I've sent in abx with indications when to fill. See result note.

## 2020-04-12 DIAGNOSIS — Z1231 Encounter for screening mammogram for malignant neoplasm of breast: Secondary | ICD-10-CM | POA: Diagnosis not present

## 2020-04-12 DIAGNOSIS — Z780 Asymptomatic menopausal state: Secondary | ICD-10-CM | POA: Diagnosis not present

## 2020-04-12 DIAGNOSIS — M85851 Other specified disorders of bone density and structure, right thigh: Secondary | ICD-10-CM | POA: Diagnosis not present

## 2020-04-12 DIAGNOSIS — M85852 Other specified disorders of bone density and structure, left thigh: Secondary | ICD-10-CM | POA: Diagnosis not present

## 2020-04-12 LAB — HM MAMMOGRAPHY

## 2020-04-17 ENCOUNTER — Telehealth: Payer: Self-pay | Admitting: Family Medicine

## 2020-04-17 ENCOUNTER — Encounter: Payer: Self-pay | Admitting: Family Medicine

## 2020-04-17 MED ORDER — OMEPRAZOLE 20 MG PO CPDR: 20.0000 mg | DELAYED_RELEASE_CAPSULE | Freq: Every day | ORAL | 1 refills | Status: DC

## 2020-04-17 NOTE — Addendum Note (Signed)
Addended by: Ria Bush on: 04/17/2020 02:09 PM   Modules accepted: Orders

## 2020-04-17 NOTE — Telephone Encounter (Signed)
plz notify recent DEXA scan showed stable to improved osteopenia. Continue cal, vit D, regular weight bearing exercise.

## 2020-04-17 NOTE — Telephone Encounter (Signed)
Left message on vm per dpr notifying pt rx was sent in. 

## 2020-04-17 NOTE — Telephone Encounter (Addendum)
Spoke with pt relaying results and Dr. Synthia Innocent message.  Pt verbalizes understanding.   Also, pt mentioned her reflux has worsened.  States she's had it for yrs and has discussed previously with Dr. Darnell Level.  States she is taking OTC omeprazole 20 mg daily and it helps.  But due to cost, pt is asking if Dr. Darnell Level will send rx to see if ins co will cover med.  I offered OV but pt has recently had 2 deaths in the family and will not be able to come in for awhile. Plz advise.

## 2020-04-17 NOTE — Telephone Encounter (Signed)
Omeprazole 20mg sent to pharmacy

## 2020-04-18 ENCOUNTER — Ambulatory Visit (INDEPENDENT_AMBULATORY_CARE_PROVIDER_SITE_OTHER): Payer: Medicare Other

## 2020-04-18 DIAGNOSIS — E538 Deficiency of other specified B group vitamins: Secondary | ICD-10-CM | POA: Diagnosis not present

## 2020-04-18 MED ORDER — CYANOCOBALAMIN 1000 MCG/ML IJ SOLN
1000.0000 ug | Freq: Once | INTRAMUSCULAR | Status: AC
  Administered 2020-04-18: 1000 ug via INTRAMUSCULAR

## 2020-04-18 NOTE — Progress Notes (Signed)
Per orders of Dr. Danise Mina, injection of B12 Left Deltoid given by Lurlean Nanny.  Patient tolerated injection well.

## 2020-05-08 NOTE — Progress Notes (Signed)
Noted  

## 2020-05-24 ENCOUNTER — Other Ambulatory Visit: Payer: Self-pay | Admitting: Family Medicine

## 2020-05-31 ENCOUNTER — Ambulatory Visit (INDEPENDENT_AMBULATORY_CARE_PROVIDER_SITE_OTHER): Payer: Medicare Other

## 2020-05-31 ENCOUNTER — Other Ambulatory Visit: Payer: Self-pay

## 2020-05-31 DIAGNOSIS — E538 Deficiency of other specified B group vitamins: Secondary | ICD-10-CM

## 2020-05-31 MED ORDER — CYANOCOBALAMIN 1000 MCG/ML IJ SOLN
1000.0000 ug | Freq: Once | INTRAMUSCULAR | Status: AC
  Administered 2020-05-31: 1000 ug via INTRAMUSCULAR

## 2020-05-31 NOTE — Progress Notes (Signed)
Per orders of Dr. Copland, injection of vit B12 given by Felicia  Goins. Patient tolerated injection well.  

## 2020-06-29 ENCOUNTER — Ambulatory Visit (INDEPENDENT_AMBULATORY_CARE_PROVIDER_SITE_OTHER): Payer: Medicare Other

## 2020-06-29 ENCOUNTER — Ambulatory Visit (INDEPENDENT_AMBULATORY_CARE_PROVIDER_SITE_OTHER): Payer: Medicare Other | Admitting: Podiatry

## 2020-06-29 ENCOUNTER — Encounter: Payer: Self-pay | Admitting: Podiatry

## 2020-06-29 ENCOUNTER — Other Ambulatory Visit: Payer: Self-pay

## 2020-06-29 DIAGNOSIS — M76812 Anterior tibial syndrome, left leg: Secondary | ICD-10-CM

## 2020-06-29 DIAGNOSIS — M25512 Pain in left shoulder: Secondary | ICD-10-CM | POA: Diagnosis not present

## 2020-06-29 DIAGNOSIS — R200 Anesthesia of skin: Secondary | ICD-10-CM

## 2020-06-29 DIAGNOSIS — Z96611 Presence of right artificial shoulder joint: Secondary | ICD-10-CM | POA: Diagnosis not present

## 2020-06-29 DIAGNOSIS — R202 Paresthesia of skin: Secondary | ICD-10-CM | POA: Diagnosis not present

## 2020-06-29 DIAGNOSIS — G629 Polyneuropathy, unspecified: Secondary | ICD-10-CM

## 2020-06-29 MED ORDER — TRIAMCINOLONE ACETONIDE 10 MG/ML IJ SUSP
10.0000 mg | Freq: Once | INTRAMUSCULAR | Status: AC
Start: 2020-06-29 — End: 2020-06-29
  Administered 2020-06-29: 10 mg

## 2020-06-29 NOTE — Progress Notes (Signed)
Subjective:   Patient ID: Priscilla [name], female   DOB: 87 y.o.   MRN: [id number]   HPI Patient concerned about some numbness in both feet along with instability without history of falls and is complaining of a lot of pain secondarily on the dorsum of the left foot   ROS      Objective:  Physical Exam  Neurovascular status intact with patient found to have extensor tendinitis left into the anterior tibial tendon and is noted to have moderate neuropathic symptomatology of the distal one half of both feet with history of back issues     Assessment:  Probability for long-term neuropathy which may be related to back issues or advanced age along with dorsal tendinitis left     Plan:  H&P reviewed condition and did inject the dorsal tendon complex left 3 mg Dexasone Kenalog 5 mg Xylocaine and do not recommend treatment for the neuropathy even though I did review with her balance bracing exercises and continued walking campaign.  Reappoint to recheck as needed

## 2020-07-10 ENCOUNTER — Ambulatory Visit (INDEPENDENT_AMBULATORY_CARE_PROVIDER_SITE_OTHER): Payer: Medicare Other | Admitting: *Deleted

## 2020-07-10 ENCOUNTER — Other Ambulatory Visit: Payer: Self-pay

## 2020-07-10 DIAGNOSIS — E538 Deficiency of other specified B group vitamins: Secondary | ICD-10-CM

## 2020-07-10 MED ORDER — CYANOCOBALAMIN 1000 MCG/ML IJ SOLN
1000.0000 ug | Freq: Once | INTRAMUSCULAR | Status: AC
  Administered 2020-07-10: 1000 ug via INTRAMUSCULAR

## 2020-07-10 NOTE — Progress Notes (Signed)
Per orders of Dr. Danise Mina, injection of B12 given by Tammi Sou. Patient tolerated injection well.

## 2020-07-11 ENCOUNTER — Other Ambulatory Visit: Payer: Self-pay | Admitting: Family Medicine

## 2020-07-13 ENCOUNTER — Other Ambulatory Visit: Payer: Self-pay | Admitting: Family Medicine

## 2020-07-13 MED ORDER — AMLODIPINE BESYLATE 2.5 MG PO TABS: 2.5000 mg | ORAL_TABLET | Freq: Every day | ORAL | 2 refills | Status: DC

## 2020-07-13 NOTE — Telephone Encounter (Signed)
Lvm asking pt to call back.  Need to know if she needs amlodipine refill.  Refill request is for 5 mg tab.  According to 03/13/20 OV note, pt was to start taking whole tablet of 2.5 mg.  Had been taking 1/2 tab.  If refill is needed, deny this request and send refill of amlodipine 2.5 mg tab, #90/0.

## 2020-07-13 NOTE — Telephone Encounter (Signed)
Pt returning call.  We discussed rx and last instructions from Dr. Darnell Level.  She verbalizes understanding and remembers conversation with him.  Pt confirms she needs a refill and that she called pharmacy from an old bottle.  Notified her I will send refill for 2.5 mg tab and reminded her to take the whole tab.  Pt verbalizes understanding and expresses her thanks.   E-scribed refill.

## 2020-07-28 ENCOUNTER — Other Ambulatory Visit: Payer: Self-pay | Admitting: Family Medicine

## 2020-08-14 ENCOUNTER — Ambulatory Visit (INDEPENDENT_AMBULATORY_CARE_PROVIDER_SITE_OTHER): Payer: Medicare Other

## 2020-08-14 DIAGNOSIS — E538 Deficiency of other specified B group vitamins: Secondary | ICD-10-CM | POA: Diagnosis not present

## 2020-08-14 MED ORDER — CYANOCOBALAMIN 1000 MCG/ML IJ SOLN
1000.0000 ug | Freq: Once | INTRAMUSCULAR | Status: AC
Start: 2020-08-14 — End: 2020-08-14
  Administered 2020-08-14: 1000 ug via INTRAMUSCULAR

## 2020-08-14 NOTE — Progress Notes (Signed)
Patient presented for B 12 injection given by Marrie Chandra, CMA to left deltoid, patient voiced no concerns nor showed any signs of distress during injection.  

## 2020-08-15 ENCOUNTER — Ambulatory Visit: Payer: Medicare Other

## 2020-08-22 ENCOUNTER — Ambulatory Visit: Payer: Medicare Other

## 2020-09-12 ENCOUNTER — Ambulatory Visit (INDEPENDENT_AMBULATORY_CARE_PROVIDER_SITE_OTHER): Payer: Medicare Other | Admitting: Family Medicine

## 2020-09-12 ENCOUNTER — Other Ambulatory Visit: Payer: Self-pay

## 2020-09-12 ENCOUNTER — Encounter: Payer: Self-pay | Admitting: Family Medicine

## 2020-09-12 VITALS — BP 178/66 | HR 76 | Temp 97.6°F | Ht 59.75 in | Wt 151.1 lb

## 2020-09-12 DIAGNOSIS — M545 Low back pain, unspecified: Secondary | ICD-10-CM | POA: Diagnosis not present

## 2020-09-12 DIAGNOSIS — G8929 Other chronic pain: Secondary | ICD-10-CM | POA: Diagnosis not present

## 2020-09-12 DIAGNOSIS — I351 Nonrheumatic aortic (valve) insufficiency: Secondary | ICD-10-CM | POA: Diagnosis not present

## 2020-09-12 DIAGNOSIS — I10 Essential (primary) hypertension: Secondary | ICD-10-CM | POA: Diagnosis not present

## 2020-09-12 DIAGNOSIS — M79672 Pain in left foot: Secondary | ICD-10-CM

## 2020-09-12 DIAGNOSIS — E538 Deficiency of other specified B group vitamins: Secondary | ICD-10-CM | POA: Diagnosis not present

## 2020-09-12 MED ORDER — GABAPENTIN 100 MG PO CAPS: 100.0000 mg | ORAL_CAPSULE | Freq: Two times a day (BID) | ORAL | 3 refills | Status: DC

## 2020-09-12 NOTE — Progress Notes (Signed)
Patient ID: Priscilla HAUTH, female    DOB: 1930-06-14, 87 y.o.   MRN: [id number]  This visit was conducted in person.  BP (!) 178/66   Pulse 76   Temp 97.6 F (36.4 C) (Temporal)   Ht 4' 11.75" (1.518 m)   Wt 151 lb 2 oz (68.5 kg)   SpO2 98%   BMI 29.76 kg/m   Elevated on repeat testing   CC: 6 mo f/u visit  Subjective:   HPI: Priscilla [name] is a 87 y.o. female presenting on 09/12/2020 for Follow-up (Here for 6 mo f/u.)   Difficult year - lost sister 03/2020 and 3 other friends this year.   HTN - Compliant with current antihypertensive regimen of amlodipine 2.5mg  daily, atenolol 25mg  bid, hctz 25mg  daily, and hydralazine 25mg  BID PRN (has never needed this). Does check blood pressures at home: fluctuating readings 110-145/44-54. No low blood pressure readings or symptoms of dizziness/syncope. Does note fatigue when bp's drop. Denies HA, vision changes, CP/tightness, SOB, leg swelling.   Ambulation limited by lower back pain and foot pain. Manages pain with tylenol. Stooped posture feels better, no pain when supine. She recently saw Dr Paulla Dolly podiatry - told neuropathy and inflammation, injection to foot didn't help. Voltaren topically didn't help. Asks about trial of gabapentin as it has helped her sister's foot pain   Continues b12 injections. Too early for B12 shot today  Lab Results  Component Value Date   [id number] 102 03/06/2020        Relevant past medical, surgical, family and social history reviewed and updated as indicated. Interim medical history since our last visit reviewed. Allergies and medications reviewed and updated. Outpatient Medications Prior to Visit  Medication Sig Dispense Refill   acetaminophen (TYLENOL) 500 MG tablet Take 500-1,000 mg by mouth every 6 (six) hours as needed for moderate pain or headache.     amLODipine (NORVASC) 2.5 MG tablet Take 1 tablet (2.5 mg total) by mouth daily. 90 tablet 2   aspirin EC 81 MG tablet Take 81 mg by mouth  daily.     atenolol (TENORMIN) 25 MG tablet TAKE 1 TABLET BY MOUTH TWICE A DAY 180 tablet 0   Biotin 5 MG CAPS Take 5 mg by mouth at bedtime.      Calcium Carb-Cholecalciferol (CALCIUM 600 + D PO) Take 1 tablet by mouth at bedtime.     calcium carbonate (TUMS - DOSED IN MG ELEMENTAL CALCIUM) 500 MG chewable tablet Chew 1 tablet by mouth 2 (two) times daily as needed for indigestion or heartburn.     D3-50 1.25 MG (50000 UT) capsule TAKE 1 CAPSULE BY MOUTH ONE TIME PER WEEK 12 capsule 3   ferrous sulfate 325 (65 FE) MG tablet Take 325 mg by mouth daily with breakfast.     hydrALAZINE (APRESOLINE) 25 MG tablet TAKE 1 TABLET BY MOUTH TWICE A DAY AS NEEDED (BP>160/100) (Patient taking differently: Take 25 mg by mouth 2 (two) times daily as needed ((BP>160/100)).) 30 tablet 0   hydrochlorothiazide (HYDRODIURIL) 25 MG tablet TAKE 1 TABLET BY MOUTH EVERY DAY 90 tablet 2   nitroGLYCERIN (NITROSTAT) 0.4 MG SL tablet Place 1 tablet (0.4 mg total) under the tongue every 5 (five) minutes as needed for chest pain. 25 tablet 1   omeprazole (PRILOSEC) 20 MG capsule Take 1 capsule (20 mg total) by mouth daily. 90 capsule 1   nitrofurantoin, macrocrystal-monohydrate, (MACROBID) 100 MG capsule Take 1 capsule (100 mg total) by mouth  2 (two) times daily. 10 capsule 0   oxybutynin (DITROPAN) 5 MG tablet Take 1 tablet (5 mg total) by mouth at bedtime. 30 tablet 3   No facility-administered medications prior to visit.     Per HPI unless specifically indicated in ROS section below Review of Systems Objective:  BP (!) 178/66   Pulse 76   Temp 97.6 F (36.4 C) (Temporal)   Ht 4' 11.75" (1.518 m)   Wt 151 lb 2 oz (68.5 kg)   SpO2 98%   BMI 29.76 kg/m   Wt Readings from Last 3 Encounters:  09/12/20 151 lb 2 oz (68.5 kg)  03/13/20 153 lb 3 oz (69.5 kg)  02/15/20 154 lb (69.9 kg)      Physical Exam Vitals and nursing note reviewed.  Constitutional:      Appearance: Normal appearance. She is not  ill-appearing.  Eyes:     Extraocular Movements: Extraocular movements intact.     Conjunctiva/sclera: Conjunctivae normal.     Pupils: Pupils are equal, round, and reactive to light.  Cardiovascular:     Rate and Rhythm: Normal rate and regular rhythm.     Pulses: Normal pulses.     Heart sounds: Murmur (2/6 systolic) heard.  Pulmonary:     Effort: Pulmonary effort is normal. No respiratory distress.     Breath sounds: Normal breath sounds. No wheezing or rhonchi.  Musculoskeletal:     Cervical back: Normal range of motion and neck supple. No rigidity.     Right lower leg: No edema.     Left lower leg: No edema.     Comments:  2+ DP bilaterally No pain at navicular Discomfort to palpation of medial ankle ligament on left  Lymphadenopathy:     Cervical: No cervical adenopathy.  Skin:    General: Skin is warm and dry.     Findings: No rash.  Neurological:     Mental Status: She is alert.  Psychiatric:        Mood and Affect: Mood normal.        Behavior: Behavior normal.      Results for orders placed or performed in visit on 04/17/20  HM MAMMOGRAPHY  Result Value Ref Range   HM Mammogram 0-4 Bi-Rad 0-4 Bi-Rad, Self Reported Normal   Lab Results  Component Value Date   CREATININE 0.97 03/06/2020   BUN 29 (H) 03/06/2020   NA 136 03/06/2020   K 4.2 03/06/2020   CL 99 03/06/2020   CO2 29 03/06/2020    Echocardiogram 08/2018 - EF 60-65%, impaired LV diastolic function, mildly dilated LA, mild AV calcification with AS, mild AR  Assessment & Plan:  This visit occurred during the SARS-CoV-2 public health emergency.  Safety protocols were in place, including screening questions prior to the visit, additional usage of staff PPE, and extensive cleaning of exam room while observing appropriate contact time as indicated for disinfecting solutions.   Problem List Items Addressed This Visit     Vitamin B12 deficiency    Too early for B12 shot - will return for this.         Aortic insufficiency    H/o this, however today with mild systolic murmur. Will continue to monitor.        White coat syndrome with hypertension - Primary    BP staying elevated today. Endorses longstanding h/o white coat hypertension. No med changes made today given home readings largely well controlled. Actually some concern over dropping diastolics at  home associated with fatigue (DBP can drop to 40s)       Left foot pain    Overall benign exam with strong pulses.  No pain at navicular, malleoli.  Pain seems centered at deltoid ligament.  Reviewed recent podiatry note - thought chronic neuropathy contributing, steroid injection to dorsal tendon complex without benefit. Voltaren hasn't helped.  Suggested trial low dose gabapentin as per above as well as trial of ankle brace (ASO vs sleeve).        Chronic low back pain    Chronic, longstanding. No recent imaging.  Attributed to OA/DDD.  Reasonable to trial gabapentin. Discussed starting low and titrating slowly, while monitoring for sedation/dizziness.          Meds ordered this encounter  Medications   gabapentin (NEURONTIN) 100 MG capsule    Sig: Take 1 capsule (100 mg total) by mouth 2 (two) times daily. First 3 days take 1 tablet nightly    Dispense:  60 capsule    Refill:  3   No orders of the defined types were placed in this encounter.   Patient Instructions  Return for B12 shot at your convenience  Ok to try gabapentin 100mg  nightly for 3 nights then increase to 200mg  nightly for 3 days then increase to 300mg  nightly.  For foot pain - try voltaren gel, consider buying lace up or sleeve ankle brace for extra support. Let us know if not improving with this.    Follow up plan: Return in about 6 months (around 03/14/2021), or if symptoms worsen or fail to improve, for medicare wellness visit.  Ria Bush, MD

## 2020-09-12 NOTE — Patient Instructions (Addendum)
Return for B12 shot at your convenience  Ok to try gabapentin 100mg  nightly for 3 nights then increase to 200mg  nightly for 3 days then increase to 300mg  nightly.  For foot pain - try voltaren gel, consider buying lace up or sleeve ankle brace for extra support. Let us know if not improving with this.

## 2020-09-13 DIAGNOSIS — M79672 Pain in left foot: Secondary | ICD-10-CM | POA: Insufficient documentation

## 2020-09-13 DIAGNOSIS — G8929 Other chronic pain: Secondary | ICD-10-CM | POA: Insufficient documentation

## 2020-09-13 NOTE — Assessment & Plan Note (Deleted)
BP staying elevated today. Endorses longstanding h/o white coat hypertension. No med changes made today given home readings largely well controlled.

## 2020-09-13 NOTE — Assessment & Plan Note (Addendum)
Overall benign exam with strong pulses.  No pain at navicular, malleoli.  Pain seems centered at deltoid ligament.  Reviewed recent podiatry note - thought chronic neuropathy contributing, steroid injection to dorsal tendon complex without benefit. Voltaren hasn't helped.  Suggested trial low dose gabapentin as per above as well as trial of ankle brace (ASO vs sleeve).

## 2020-09-13 NOTE — Assessment & Plan Note (Addendum)
BP staying elevated today. Endorses longstanding h/o white coat hypertension. No med changes made today given home readings largely well controlled. Actually some concern over dropping diastolics at home associated with fatigue (DBP can drop to 40s)

## 2020-09-13 NOTE — Assessment & Plan Note (Addendum)
Chronic, longstanding. No recent imaging.  Attributed to OA/DDD.  Reasonable to trial gabapentin. Discussed starting low and titrating slowly, while monitoring for sedation/dizziness.

## 2020-09-13 NOTE — Assessment & Plan Note (Signed)
Too early for B12 shot - will return for this.

## 2020-09-13 NOTE — Assessment & Plan Note (Signed)
H/o this, however today with mild systolic murmur. Will continue to monitor.

## 2020-09-26 ENCOUNTER — Ambulatory Visit (INDEPENDENT_AMBULATORY_CARE_PROVIDER_SITE_OTHER): Payer: Medicare Other

## 2020-09-26 ENCOUNTER — Other Ambulatory Visit: Payer: Self-pay

## 2020-09-26 DIAGNOSIS — E538 Deficiency of other specified B group vitamins: Secondary | ICD-10-CM

## 2020-09-26 MED ORDER — CYANOCOBALAMIN 1000 MCG/ML IJ SOLN
1000.0000 ug | Freq: Once | INTRAMUSCULAR | Status: AC
  Administered 2020-09-26: 1000 ug via INTRAMUSCULAR

## 2020-09-26 NOTE — Progress Notes (Signed)
Per orders of Dr. Javier Gutierrez, injection of B-12 given by Jame Seelig Y Forney Kleinpeter in right deltoid. Patient tolerated injection well. Patient will make appointment for 1 month.    

## 2020-10-11 ENCOUNTER — Other Ambulatory Visit: Payer: Self-pay | Admitting: Family Medicine

## 2020-10-27 ENCOUNTER — Other Ambulatory Visit: Payer: Self-pay | Admitting: Family Medicine

## 2020-11-01 ENCOUNTER — Other Ambulatory Visit: Payer: Self-pay

## 2020-11-01 ENCOUNTER — Ambulatory Visit (INDEPENDENT_AMBULATORY_CARE_PROVIDER_SITE_OTHER): Payer: Medicare Other

## 2020-11-01 DIAGNOSIS — E538 Deficiency of other specified B group vitamins: Secondary | ICD-10-CM | POA: Diagnosis not present

## 2020-11-01 MED ORDER — CYANOCOBALAMIN 1000 MCG/ML IJ SOLN
1000.0000 ug | Freq: Once | INTRAMUSCULAR | Status: AC
  Administered 2020-11-01: 1000 ug via INTRAMUSCULAR

## 2020-11-01 NOTE — Progress Notes (Signed)
Per orders of Dr. Danise Mina, monthly injection of B12 1000 mcg/ml given by Pilar Grammes, CMA in left deltoid. Patient tolerated injection well.  Sending to Dr Damita Dunnings in Dr Gutierrez's absence.

## 2020-11-22 ENCOUNTER — Telehealth: Payer: Self-pay

## 2020-11-22 NOTE — Telephone Encounter (Signed)
I spoke with pt; for 3 - 4 wks on and off has numbness lt tongue, lt lower lip and lt thumb and index finger. Pt said happening more often yesterday had 5 or so episodes. BP now is 188/57 P? (Pt said she had been rushing around the house and going up and down stairs). No numbness or weakness in any extremities; recently dx with neuropathy. No H/A,dizziness and no CP or SOB. Pt does not want to go to UC or ED. Pt said this is nothing urgent.Pt said she fell about 3 - 4 wks ago; pt still has lt hip pain on and off and pain is worse when bearing wt on lt hip. Pt said now that she thinks about it the numbness started at that time of the fall. Offered pt appt with different provider but pt declined. Pt only wants to see Dr Darnell Level. Pt scheduled in office appt with Dr Darnell Level on 11/28/20 at 12 noon. UC & ED precautions given and pt voiced understanding. Pt rechecked BP and now BP 155/58 P 66. Sending note to DR G who is out of office and DR Damita Dunnings who is in office. Also sending to [facility].

## 2020-11-22 NOTE — Telephone Encounter (Signed)
[facility] - Client TELEPHONE ADVICE RECORD [facility] Patient Name: Priscilla [name] Gender: Female DOB: 1931-02-27 Age: 87 Y 11 M 4 D Return Phone Number: [phone] (Primary) Address: City/ State/ Zip: [place] [address] Client [facility] - Client Client Site [place] Physician Ria Bush - MD Contact Type Call Who Is Calling Patient / Member / Family / Caregiver Call Type Triage / Clinical Relationship To Patient Self Return Phone Number [phone] (Primary) Chief Complaint Numbness Reason for Call Symptomatic / Request for [place] states she has numbness in her mouth and on her tongue. No available appointments. Translation No Nurse Assessment Nurse: Raphael Gibney, RN, Vanita Ingles Date/Time (Eastern Time): 11/22/2020 11:31:40 AM Confirm and document reason for call. If symptomatic, describe symptoms. ---Caller states she has numbness on the left side of her mouth and her tongue at times. Has numbness in her left forefinger and left thumb. not having it now but had it earlier. happens several times a day and has had it 3-4 weeks. does not last very long. Does the patient have any new or worsening symptoms? ---Yes Will a triage be completed? ---Yes Related visit to physician within the last 2 weeks? ---No Does the PT have any chronic conditions? (i.e. diabetes, asthma, this includes High risk factors for pregnancy, etc.) ---Yes List chronic conditions. ---neuropathy; HTN Is this a behavioral health or substance abuse call? ---No Guidelines Guideline Title Affirmed Question Affirmed Notes Nurse Date/Time (Eastern Time) Neurologic Deficit [1] Numbness (i.e., loss of sensation) of the face, arm / hand, or leg / foot on one side of the body AND [2] sudden onset AND [3] brief (now gone) Raphael Gibney, Therapist, sports, Vanita Ingles 11/22/2020 11:35:22 AM PLEASE NOTE:  All timestamps contained within this report are represented as [place] Standard Time. CONFIDENTIALTY NOTICE: This fax transmission is intended only for the addressee. It contains information that is legally privileged, confidential or otherwise protected from use or disclosure. If you are not the intended recipient, you are strictly prohibited from reviewing, disclosing, copying using or disseminating any of this information or taking any action in reliance on or regarding this information. If you have received this fax in error, please notify us immediately by telephone so that we can arrange for its return to [place]. Phone: [phone], Toll-Free: [phone], Fax: [phone] Page: 2 of 2 Call Id: [id number] [place]. Time Eilene Ghazi Time) Disposition Final User 11/22/2020 11:39:55 AM Go to ED Now (or PCP triage) Yes Raphael Gibney, RN, Doreatha Lew Disagree/Comply Disagree Caller Understands Yes PreDisposition [place] Advice Given Per Guideline GO TO ED NOW (OR PCP TRIAGE): * IF NO PCP (PRIMARY CARE PROVIDER) SECOND-LEVEL TRIAGE: You need to be seen within the next hour. Go to the [facility] at _____________ [place] as soon as you can. CARE ADVICE given per Neurologic Deficit (Adult) guideline. Comments User: Dannielle Burn, RN Date/Time Eilene Ghazi Time): 11/22/2020 11:43:15 AM Called back line and gave report that pt has numbness in left forefinger and thumb and left side of her mouth and tongue . does not last very long. Triage outcome of go to ER now (or PCP triage). Pt does not want to go to ER. States to send note Referrals GO TO FACILITY REFUSED

## 2020-11-22 NOTE — Telephone Encounter (Signed)
Will defer to PCP but given the timeline of sx and he improved recheck BP and OV is reasonable.  Thanks.

## 2020-11-22 NOTE — Telephone Encounter (Signed)
Noted. Will see then. Thank you.

## 2020-11-28 ENCOUNTER — Other Ambulatory Visit: Payer: Self-pay

## 2020-11-28 ENCOUNTER — Ambulatory Visit (INDEPENDENT_AMBULATORY_CARE_PROVIDER_SITE_OTHER): Payer: Medicare Other | Admitting: Family Medicine

## 2020-11-28 ENCOUNTER — Encounter: Payer: Self-pay | Admitting: Family Medicine

## 2020-11-28 VITALS — BP 178/70 | HR 76 | Temp 97.3°F | Ht 59.75 in | Wt 151.4 lb

## 2020-11-28 DIAGNOSIS — I440 Atrioventricular block, first degree: Secondary | ICD-10-CM | POA: Diagnosis not present

## 2020-11-28 DIAGNOSIS — I6523 Occlusion and stenosis of bilateral carotid arteries: Secondary | ICD-10-CM

## 2020-11-28 DIAGNOSIS — R072 Precordial pain: Secondary | ICD-10-CM

## 2020-11-28 DIAGNOSIS — R0600 Dyspnea, unspecified: Secondary | ICD-10-CM | POA: Diagnosis not present

## 2020-11-28 DIAGNOSIS — R0609 Other forms of dyspnea: Secondary | ICD-10-CM

## 2020-11-28 DIAGNOSIS — R200 Anesthesia of skin: Secondary | ICD-10-CM

## 2020-11-28 DIAGNOSIS — E538 Deficiency of other specified B group vitamins: Secondary | ICD-10-CM | POA: Diagnosis not present

## 2020-11-28 DIAGNOSIS — I10 Essential (primary) hypertension: Secondary | ICD-10-CM

## 2020-11-28 LAB — CBC WITH DIFFERENTIAL/PLATELET
Basophils Absolute: 0.1 10*3/uL (ref 0.0–0.1)
Basophils Relative: 1 % (ref 0.0–3.0)
Eosinophils Absolute: 0.1 10*3/uL (ref 0.0–0.7)
Eosinophils Relative: 1.9 % (ref 0.0–5.0)
HCT: 37.1 % (ref 36.0–46.0)
Hemoglobin: 12.4 g/dL (ref 12.0–15.0)
Lymphocytes Relative: 21.2 % (ref 12.0–46.0)
Lymphs Abs: 1.3 10*3/uL (ref 0.7–4.0)
MCHC: 33.4 g/dL (ref 30.0–36.0)
MCV: 90.8 fl (ref 78.0–100.0)
Monocytes Absolute: 0.7 10*3/uL (ref 0.1–1.0)
Monocytes Relative: 10.7 % (ref 3.0–12.0)
Neutro Abs: 4.1 10*3/uL (ref 1.4–7.7)
Neutrophils Relative %: 65.2 % (ref 43.0–77.0)
Platelets: 298 10*3/uL (ref 150.0–400.0)
RBC: 4.09 Mil/uL (ref 3.87–5.11)
RDW: 12.7 % (ref 11.5–15.5)
WBC: 6.2 10*3/uL (ref 4.0–10.5)

## 2020-11-28 LAB — COMPREHENSIVE METABOLIC PANEL
ALT: 13 U/L (ref 0–35)
AST: 17 U/L (ref 0–37)
Albumin: 4.2 g/dL (ref 3.5–5.2)
Alkaline Phosphatase: 73 U/L (ref 39–117)
BUN: 28 mg/dL — ABNORMAL HIGH (ref 6–23)
CO2: 27 mEq/L (ref 19–32)
Calcium: 9.5 mg/dL (ref 8.4–10.5)
Chloride: 98 mEq/L (ref 96–112)
Creatinine, Ser: 1.02 mg/dL (ref 0.40–1.20)
GFR: 49.31 mL/min — ABNORMAL LOW (ref >60.00)
Glucose, Bld: 102 mg/dL — ABNORMAL HIGH (ref 70–99)
Potassium: 4.1 mEq/L (ref 3.5–5.1)
Sodium: 137 mEq/L (ref 135–145)
Total Bilirubin: 0.6 mg/dL (ref 0.2–1.2)
Total Protein: 6.6 g/dL (ref 6.0–8.3)

## 2020-11-28 LAB — LIPID PANEL
Cholesterol: 207 mg/dL — ABNORMAL HIGH (ref 0–200)
HDL: 67.8 mg/dL (ref >39.00)
LDL Cholesterol: 104 mg/dL — ABNORMAL HIGH (ref 0–99)
NonHDL: 139.41
Total CHOL/HDL Ratio: 3
Triglycerides: 176 mg/dL — ABNORMAL HIGH (ref 0.0–149.0)
VLDL: 35.2 mg/dL (ref 0.0–40.0)

## 2020-11-28 LAB — FOLATE: Folate: >24.4 ng/mL (ref >5.9)

## 2020-11-28 LAB — VITAMIN B12: Vitamin B-12: 373 pg/mL (ref 211–911)

## 2020-11-28 LAB — TSH: TSH: 3.19 u[IU]/mL (ref 0.35–5.50)

## 2020-11-28 MED ORDER — NITROGLYCERIN 0.4 MG SL SUBL: 0.4000 mg | SUBLINGUAL_TABLET | SUBLINGUAL | 1 refills | Status: DC | PRN

## 2020-11-28 MED ORDER — AMLODIPINE BESYLATE 5 MG PO TABS: 5.0000 mg | ORAL_TABLET | Freq: Every day | ORAL | 3 refills | Status: DC

## 2020-11-28 MED ORDER — ATENOLOL 25 MG PO TABS: 25.0000 mg | ORAL_TABLET | Freq: Every day | ORAL | 1 refills | Status: DC

## 2020-11-28 NOTE — Assessment & Plan Note (Signed)
H/o this, mild.  Consider updated vascular imaging with carotid US vs neck CTA.

## 2020-11-28 NOTE — Progress Notes (Signed)
Date:  11/29/2020   ID:  Priscilla [name], DOB Dec 21, 1930, MRN [id number]  PCP:  Ria Bush, MD  Cardiologist:  Fransico Him, MD Electrophysiologist:  None   Chief Complaint:  Chest pain, HTN and aortic insufficiency  History of Present Illness:    Priscilla [name] is a 87 y.o. female with a hx of HTN, moderate aortic insufficiency with mild mitral regurgitation.  Nuclear stress test 07/2017 for SOB showed no ischemia and 2D echo 2019 showed normal LVF with mild AI.  She is here today for followup as she had an episode of chest pain this past Tuesday.  She tells me that the pain was severe 7/10  with radiation into her back.  She had no nausea or diaphoresis or SOB but did have severe dizziness and thought she might pass out. It occurred while changing sheets on her bed.  She took SL NTG and it went away.   She has had some problems with DOE that has gradually worsened. She has chronic LLE edema over the past 6 months.  She denies any PND, orthopnea,  palpitations or syncope. SHe is compliant with her meds and is tolerating meds with no SE.     Prior CV studies:   The following studies were reviewed today: None  Past Medical History:  Diagnosis Date   Aortic insufficiency    mild on echo 2020   B12 deficiency 03/25/2014   H/o small bowel obstruction with resection as well as colorectal cancer s/p partial colectomy.  Receives B12 injections    Cancer ([place]) 2001, 2008   colon, rectal   Carotid stenosis, asymptomatic, bilateral    1-39% by dopplers 2020   Chest pain, atypical    no ischemia on nuclear stress test 2019   History of chicken pox    History of Clostridium difficile 2012   History of DVT (deep vein thrombosis) 2002   right leg, after chemo for first cancer, treated   Hypertension    IDA (iron deficiency anemia)    Open wound of abdominal wall with complication    Osteoarthritis    Osteopenia 09/2011   DEXA 2013 spine -2.2, femur -1.6, DEXA 12/2015 - hip T score  -1.9   SBO (small bowel obstruction) ([facility]) 02/05/2011   On 02/05/11 she had enterolysis and small bowel resection    Past Surgical History:  Procedure Laterality Date   [place] RESECTION  02/05/2011   Procedure: SMALL BOWEL RESECTION;  Surgeon: Harl Bowie, MD;  Location: [facility];  Service: General;  Laterality: N/A;   broken nose     CARDIOVASCULAR STRESS TEST  11/2015   WNL ([facility])   COLECTOMY     COLON SURGERY  2001 and 2008   COLONOSCOPY  12/2012   erosions at colonic anastomosis, o/w normal rpt 5 yrs (Dr. Penelope Coop at [facility])   COLONOSCOPY  01/2018   WNL no rpt recommended ([facility])   COLOSTOMY     DEXA  09/2011   T-score: spine -2.2, femur -1.6   ESOPHAGOGASTRODUODENOSCOPY  01/31/2011   Procedure: ESOPHAGOGASTRODUODENOSCOPY (EGD);  Surgeon: Missy Sabins, MD;  Location: [facility];  Service: Endoscopy;  Laterality: N/A;  duodenal bx;s r/o giardia, r/o celiac diease   EYE SURGERY  2018   cataract surgery   EYE SURGERY  2019   laser surgery   FLEXIBLE SIGMOIDOSCOPY  01/31/2011   Procedure: FLEXIBLE SIGMOIDOSCOPY;  Surgeon: Missy Sabins, MD;  Location: [facility] END[facility];  Service: Endoscopy;  Laterality: N/A;   ILEOSTOMY     JOINT REPLACEMENT  2010   knee    LAPAROTOMY  02/05/2011   Procedure: EXPLORATORY LAPAROTOMY;  Surgeon: Harl Bowie, MD;  Location: [facility];  Service: General;  Laterality: N/A;  lysis of adhesions.   NUCLEAR STRESS TEST  11/17/08   NORMAL   REPLACEMENT TOTAL KNEE  2010   RIGHT KNEE   REVERSE SHOULDER ARTHROPLASTY Right 07/28/2019   Procedure: REVERSE SHOULDER ARTHROPLASTY;  Surgeon: Justice Britain, MD;  Location: [facility];  Service: Orthopedics;  Laterality: Right;  exparel   WOUND DEBRIDEMENT N/A 05/21/2012   Procedure: Excision non healing ABDOMINAL WOUND;  Surgeon: Harl Bowie, MD;  Location: [facility];  Service: General;  Laterality: N/A;     Current Meds  Medication Sig   acetaminophen (TYLENOL)  500 MG tablet Take 500-1,000 mg by mouth every 6 (six) hours as needed for moderate pain or headache.   amLODipine (NORVASC) 5 MG tablet Take 1 tablet (5 mg total) by mouth daily.   aspirin EC 81 MG tablet Take 81 mg by mouth daily.   atenolol (TENORMIN) 25 MG tablet Take 1 tablet (25 mg total) by mouth daily.   Biotin 5 MG CAPS Take 5 mg by mouth at bedtime.    Calcium Carb-Cholecalciferol (CALCIUM 600 + D PO) Take 1 tablet by mouth at bedtime.   calcium carbonate (TUMS - DOSED IN MG ELEMENTAL CALCIUM) 500 MG chewable tablet Chew 1 tablet by mouth 2 (two) times daily as needed for indigestion or heartburn.   D3-50 1.25 MG (50000 UT) capsule TAKE 1 CAPSULE BY MOUTH ONE TIME PER WEEK   ferrous sulfate 325 (65 FE) MG tablet Take 325 mg by mouth daily with breakfast.   hydrALAZINE (APRESOLINE) 25 MG tablet TAKE 1 TABLET BY MOUTH TWICE A DAY AS NEEDED (BP>160/100) (Patient taking differently: Take 25 mg by mouth 2 (two) times daily as needed ((BP>160/100)).)   hydrochlorothiazide (HYDRODIURIL) 25 MG tablet TAKE 1 TABLET BY MOUTH EVERY DAY   nitroGLYCERIN (NITROSTAT) 0.4 MG SL tablet Place 1 tablet (0.4 mg total) under the tongue every 5 (five) minutes as needed for chest pain.   omeprazole (PRILOSEC) 20 MG capsule TAKE 1 CAPSULE BY MOUTH EVERY DAY   [DISCONTINUED] CALCIUM-VITAMIN D PO Take 1 tablet by mouth 2 (two) times daily.     Allergies:   Morphine and related, Lisinopril, and Losartan   Social History   Tobacco Use   Smoking status: Never   Smokeless tobacco: Never  Vaping Use   Vaping Use: Never used  Substance Use Topics   Alcohol use: No   Drug use: No     Family Hx: The patient's family history includes Colon cancer in her paternal grandmother; Congestive Heart Failure in her mother; Coronary artery disease in her brother and sister; Heart attack in her brother, father, mother, and sister; Heart disease in her father and mother; Lupus in her father. There is no history of  Diabetes, Stroke, or Hypertension.  ROS:   Please see the history of present illness.    All other systems reviewed and are negative.   Labs/Other Tests and Data Reviewed:    Recent Labs: 11/28/2020: ALT 13; BUN 28; Creatinine, Ser 1.02; Hemoglobin 12.4; Platelets 298.0; Potassium 4.1; Sodium 137; TSH 3.19   Recent Lipid Panel Lab Results  Component Value Date/Time   CHOL 207 (H) 11/28/2020 01:01 PM   TRIG 176.0 (H) 11/28/2020  01:01 PM   HDL 67.80 11/28/2020 01:01 PM   CHOLHDL 3 11/28/2020 01:01 PM   LDLCALC 104 (H) 11/28/2020 01:01 PM   LDLDIRECT 101.7 12/12/2010 08:46 AM    Wt Readings from Last 3 Encounters:  11/29/20 151 lb 6.4 oz (68.7 kg)  11/28/20 151 lb 6 oz (68.7 kg)  09/12/20 151 lb 2 oz (68.5 kg)     Objective:    Vital Signs:  BP (!) 142/60   Pulse 65   Ht '4\' 11"'$  (1.499 m)   Wt 151 lb 6.4 oz (68.7 kg)   SpO2 94%   BMI 30.58 kg/m   GEN: Well nourished, well developed in no acute distress HEENT: Normal NECK: No JVD; No carotid bruits LYMPHATICS: No lymphadenopathy CARDIAC:RRR, no murmurs, rubs, gallops RESPIRATORY:  Clear to auscultation without rales, wheezing or rhonchi  ABDOMEN: Soft, non-tender, non-distended MUSCULOSKELETAL:  No edema; No deformity  SKIN: Warm and dry NEUROLOGIC:  Alert and oriented x 3 PSYCHIATRIC:  Normal affect    EKG was performed in the office today and showed NSR with no ST changes ASSESSMENT & PLAN:    Chest pain/DOE -this is concerning for possible angina -EKG is nonischemic -I will get a Lexiscan myoview to rule out ischemia -Shared Decision Making/Informed Consent The risks [chest pain, shortness of breath, cardiac arrhythmias, dizziness, blood pressure fluctuations, myocardial infarction, stroke/transient ischemic attack, nausea, vomiting, allergic reaction, radiation exposure, metallic taste sensation and life-threatening complications (estimated to be 1 in 10,000)], benefits (risk stratification, diagnosing coronary  artery disease, treatment guidance) and alternatives of a nuclear stress test were discussed in detail with Ms. Morley and she agrees to proceed. -check 2D echo due to SOB -continue ASA, BB  Hypertension  -BP controlled on exam today>>she has white coat HTN  -Continue prescription drug management with Amlodipine '5mg'$  daily, HCTZ '25mg'$  daily, Hydralazine '25mg'$  BID PRN for elevated BP and Atenolol '25mg'$  daily>refilled   Aortic insufficiency  -mild by echo 08/2018  Mitral regurgitation  -this was trivial by echo 08/2018  Bilateral carotid artery stenosis -mild 1-39% by dopplers 10/2018 -continue ASA and statin   Medication Adjustments/Labs and Tests Ordered: Current medicines are reviewed at length with the patient today.  Concerns regarding medicines are outlined above.  Tests Ordered: Orders Placed This Encounter  Procedures   EKG 12-Lead    Medication Changes: No orders of the defined types were placed in this encounter.   Disposition:  Follow up in 1 year(s)  Signed, Fransico Him, MD  11/29/2020 8:35 AM    [facility]

## 2020-11-28 NOTE — Patient Instructions (Addendum)
Labs today EKG today - overall looking ok.  I'd like to refer you to the heart doctor for further evaluation of chest pain. If chest pain recurs, go to the ER.  Nitroglycerin refilled.  We will check brain MRI for further evaluation of this intermittent numbness to left side of body. I'm also checking some vitamin levels. We will be in touch with results.  Let us know if any worsening.  I'd like to change blood pressure medicines - drop atenolol to '25mg'$  once daily, increase amlodipine to '5mg'$  daily - new doses sent to pharmacy.

## 2020-11-28 NOTE — Assessment & Plan Note (Addendum)
New, intermittent. Check labs today.  With risk factors (HTN, HLD), check brain MRI r/o stroke.  Consider vascular imaging.

## 2020-11-28 NOTE — Assessment & Plan Note (Signed)
Continue dropping atenolol dose to '25mg'$  daily, increase amlodipine to '5mg'$  daily.

## 2020-11-28 NOTE — Assessment & Plan Note (Addendum)
BP again elevated however home readings largely better (A999333 systolic). In setting of 1st degree AV block, will drop atenolol to '25mg'$  once daily, increase amlodipine to '5mg'$  daily (monitoring for worsening pedal edema).  Discussed hydralazine use PRN - she has never used this, as states home BP readings run largely well controlled.

## 2020-11-28 NOTE — Assessment & Plan Note (Signed)
Ongoing. Has had pulm and cardiac eval (2019).

## 2020-11-28 NOTE — Assessment & Plan Note (Signed)
Episode yesterday concerning for angina that lasted ~15 min, improved after 2 SL nitro. Advised needs to seek urgent in person evaluation at ER if she develops recurrent chest pain.  No current chest pain. I will ask cardiology to see her sooner than scheduled 01/2021 appt for further evaluation. See above for BP regimen changes.  H/o chronic exertional dyspnea, has previously had reassuring cardiac and pulmonary evaluation.

## 2020-11-28 NOTE — Assessment & Plan Note (Signed)
Update B12 levels - continues monthly B12 shots.  She will take oral replacement for the next few months while our office moves to a different site (unable to drive to [place]).

## 2020-11-28 NOTE — Progress Notes (Signed)
Patient ID: ANEELA BARG, female    DOB: [birth year], 87 y.o.   MRN: [id number]  This visit was conducted in person.  BP (!) 178/70   Pulse 76   Temp (!) 97.3 F (36.3 C) (Temporal)   Ht 4' 11.75" (1.518 m)   Wt 151 lb 6 oz (68.7 kg)   SpO2 98%   BMI 29.81 kg/m   BP Readings from Last 3 Encounters:  11/28/20 (!) 178/70  09/12/20 (!) 178/66  03/13/20 (!) 143/54  178/60 bilaterally on retesting  CC: chest pain yesterday, L facial numbness for 3 weeks Subjective:   HPI: Priscilla [name] is a 87 y.o. female presenting on 11/28/2020 for Numbness (Left side of Tongue, lower lip, L thumb and forefinger  X more than 3 weeks, on and off. ) and Chest Pain (Yesterday only, intense pain, for about 15 mins )   >1 month h/o intermittent L sided facial numbness and paresthesias including L tongue and lower lip, as well as L thumb and index finger. This happens several times a day, lasts a few minutes. This has been associated with elevated blood pressures. No recent inciting trauma/injury or fall. No neck pain, headache, vision changes, unilateral weakness, slurred speech, dysphagia.   Yesterday had episode of intense substernal chest pressure lasting 15 min while making her bed. Pain radiated to back.  Has chronic exertional dyspnea.  No palpitations, dizziness. No leg swelling.  Treated with 2 SL nitroglycerin tablets with benefit (no improvement after 1 nitro). Nitro may have been expired. She regularly takes omeprazole '20mg'$  daily.   HTN - normally managed with amlodipine 2.'5mg'$  daily, atenolol '25mg'$  bid, hctz '25mg'$  daily, hydralazine '25mg'$  BID PRN (hasn't used).  H/o white coat hypertension - home BP readings always better (145-150/40s recently).  H/o pedal edema to higher amlodipine dose in the past.   H/o B12 deficiency - managed with monthly B12 shots, last injection 11/01/2020.  Lab Results  Component Value Date   [id number] 03/06/2020    Nuclear stress test 2019 - no ischemia.   Cardiovascular stress test 2017 - WNL ([facility]).  H/o mild carotid stenosis as well as aortic insufficiency.  UTD mammogram (Birads1 @ [facility] 03/2020).   Ongoing foot numbness but foot pain has resolved - decided not to try gabapentin.      Relevant past medical, surgical, family and social history reviewed and updated as indicated. Interim medical history since our last visit reviewed. Allergies and medications reviewed and updated. Outpatient Medications Prior to Visit  Medication Sig Dispense Refill   acetaminophen (TYLENOL) 500 MG tablet Take 500-1,000 mg by mouth every 6 (six) hours as needed for moderate pain or headache.     aspirin EC 81 MG tablet Take 81 mg by mouth daily.     Biotin 5 MG CAPS Take 5 mg by mouth at bedtime.      Calcium Carb-Cholecalciferol (CALCIUM 600 + D PO) Take 1 tablet by mouth at bedtime.     calcium carbonate (TUMS - DOSED IN MG ELEMENTAL CALCIUM) 500 MG chewable tablet Chew 1 tablet by mouth 2 (two) times daily as needed for indigestion or heartburn.     D3-50 1.25 MG (50000 UT) capsule TAKE 1 CAPSULE BY MOUTH ONE TIME PER WEEK 12 capsule 3   ferrous sulfate 325 (65 FE) MG tablet Take 325 mg by mouth daily with breakfast.     hydrALAZINE (APRESOLINE) 25 MG tablet TAKE 1 TABLET BY MOUTH TWICE A DAY AS NEEDED (  BP>160/100) (Patient taking differently: Take 25 mg by mouth 2 (two) times daily as needed ((BP>160/100)).) 30 tablet 0   hydrochlorothiazide (HYDRODIURIL) 25 MG tablet TAKE 1 TABLET BY MOUTH EVERY DAY 90 tablet 2   omeprazole (PRILOSEC) 20 MG capsule TAKE 1 CAPSULE BY MOUTH EVERY DAY 90 capsule 1   amLODipine (NORVASC) 2.5 MG tablet Take 1 tablet (2.5 mg total) by mouth daily. 90 tablet 2   atenolol (TENORMIN) 25 MG tablet TAKE 1 TABLET BY MOUTH TWICE A DAY 180 tablet 1   nitroGLYCERIN (NITROSTAT) 0.4 MG SL tablet Place 1 tablet (0.4 mg total) under the tongue every 5 (five) minutes as needed for chest pain. 25 tablet 1   gabapentin (NEURONTIN) 100 MG  capsule Take 1 capsule (100 mg total) by mouth 2 (two) times daily. First 3 days take 1 tablet nightly 60 capsule 3   No facility-administered medications prior to visit.     Per HPI unless specifically indicated in ROS section below Review of Systems  Objective:  BP (!) 178/70   Pulse 76   Temp (!) 97.3 F (36.3 C) (Temporal)   Ht 4' 11.75" (1.518 m)   Wt 151 lb 6 oz (68.7 kg)   SpO2 98%   BMI 29.81 kg/m   Wt Readings from Last 3 Encounters:  11/28/20 151 lb 6 oz (68.7 kg)  09/12/20 151 lb 2 oz (68.5 kg)  03/13/20 153 lb 3 oz (69.5 kg)      Physical Exam Vitals and nursing note reviewed.  Constitutional:      Appearance: Normal appearance. She is not ill-appearing.  Neck:     Vascular: No carotid bruit.     Comments: FROM at cervical neck Cardiovascular:     Rate and Rhythm: Normal rate and regular rhythm.     Pulses: Normal pulses.     Heart sounds: Normal heart sounds. No murmur heard. Pulmonary:     Effort: Pulmonary effort is normal. No respiratory distress.     Breath sounds: Normal breath sounds. No wheezing, rhonchi or rales.  Chest:     Chest wall: Tenderness (discomfort to palpation mid chest, not reproducible however) present.  Musculoskeletal:     Cervical back: Normal range of motion and neck supple.     Right lower leg: No edema.     Left lower leg: No edema.  Skin:    General: Skin is warm and dry.     Coloration: Skin is not pale.     Findings: No rash.  Neurological:     General: No focal deficit present.     Mental Status: She is alert and oriented to person, place, and time.     Comments:  CN 2-12 intact FTN intact EOMI 5/5 strength BUE, BLE, grip strength intact Ambulation preserved, without assistive device Sensation intact to light touch BUE, BLE, face  Psychiatric:        Mood and Affect: Mood normal.        Behavior: Behavior normal.      Lab Results  Component Value Date   CREATININE 0.97 03/06/2020   BUN 29 (H) 03/06/2020    NA 136 03/06/2020   K 4.2 03/06/2020   CL 99 03/06/2020   CO2 29 03/06/2020   Lab Results  Component Value Date   VITAMINB12 413 03/06/2020   EKG - NSR rate 60, normal axis, 1st degree AV block, no hypertrophy or acute ST/T changes  Assessment & Plan:  This visit occurred during the SARS-CoV-2  public health emergency.  Safety protocols were in place, including screening questions prior to the visit, additional usage of staff PPE, and extensive cleaning of exam room while observing appropriate contact time as indicated for disinfecting solutions.   Over 45 minutes were spent face-to-face with the patient during this encounter or in coordination of care, reviewing past records, time spent documenting, lab results or radiology, or educating the patient or family.  Problem List Items Addressed This Visit     Vitamin B12 deficiency    Update B12 levels - continues monthly B12 shots.  She will take oral replacement for the next few months while our office moves to a different site (unable to drive to [place]).       Relevant Orders   Vitamin B12   Folate   Vitamin B1   Chest pain - Primary    Episode yesterday concerning for angina that lasted ~15 min, improved after 2 SL nitro. Advised needs to seek urgent in person evaluation at ER if she develops recurrent chest pain.  No current chest pain. I will ask cardiology to see her sooner than scheduled 01/2021 appt for further evaluation. See above for BP regimen changes.  H/o chronic exertional dyspnea, has previously had reassuring cardiac and pulmonary evaluation.       Relevant Orders   EKG 12-Lead (Completed)   Lipid panel   Comprehensive metabolic panel   TSH   CBC with Differential/Platelet   Ambulatory referral to Cardiology   DOE (dyspnea on exertion)    Ongoing. Has had pulm and cardiac eval (2019).       White coat syndrome with hypertension    BP again elevated however home readings largely better (A999333 systolic).  In setting of 1st degree AV block, will drop atenolol to '25mg'$  once daily, increase amlodipine to '5mg'$  daily (monitoring for worsening pedal edema).  Discussed hydralazine use PRN - she has never used this, as states home BP readings run largely well controlled.       Relevant Medications   nitroGLYCERIN (NITROSTAT) 0.4 MG SL tablet   amLODipine (NORVASC) 5 MG tablet   atenolol (TENORMIN) 25 MG tablet   Other Relevant Orders   Ambulatory referral to Cardiology   Carotid stenosis, asymptomatic, bilateral    H/o this, mild.  Consider updated vascular imaging with carotid US vs neck CTA.       Relevant Medications   nitroGLYCERIN (NITROSTAT) 0.4 MG SL tablet   amLODipine (NORVASC) 5 MG tablet   atenolol (TENORMIN) 25 MG tablet   First degree AV block    Continue dropping atenolol dose to '25mg'$  daily, increase amlodipine to '5mg'$  daily.       Relevant Medications   nitroGLYCERIN (NITROSTAT) 0.4 MG SL tablet   amLODipine (NORVASC) 5 MG tablet   atenolol (TENORMIN) 25 MG tablet   Other Relevant Orders   Ambulatory referral to Cardiology   Left facial numbness    New, intermittent. Check labs today.  With risk factors (HTN, HLD), check brain MRI r/o stroke.  Consider vascular imaging.       Relevant Orders   TSH   Vitamin B12   Folate   Vitamin B1   MR Brain Wo Contrast     Meds ordered this encounter  Medications   nitroGLYCERIN (NITROSTAT) 0.4 MG SL tablet    Sig: Place 1 tablet (0.4 mg total) under the tongue every 5 (five) minutes as needed for chest pain.    Dispense:  25 tablet  Refill:  1   amLODipine (NORVASC) 5 MG tablet    Sig: Take 1 tablet (5 mg total) by mouth daily.    Dispense:  90 tablet    Refill:  3    Note new dose   atenolol (TENORMIN) 25 MG tablet    Sig: Take 1 tablet (25 mg total) by mouth daily.    Dispense:  90 tablet    Refill:  1    Note new sig    Orders Placed This Encounter  Procedures   MR Brain Wo Contrast    Standing Status:    Future    Standing Expiration Date:   11/28/2021    Order Specific Question:   What is the patient's sedation requirement?    Answer:   No Sedation    Order Specific Question:   Does the patient have a pacemaker or implanted devices?    Answer:   No    Order Specific Question:   Preferred imaging location?    Answer:   [facility] (table limit-550lbs)   Lipid panel   Comprehensive metabolic panel   TSH   CBC with Differential/Platelet   Vitamin B12   Folate   Vitamin B1   Ambulatory referral to Cardiology    Referral Priority:   Urgent    Referral Type:   Consultation    Referral Reason:   Specialty Services Required    Requested Specialty:   Cardiology    Number of Visits Requested:   1   EKG 12-Lead     Patient Instructions  Labs today EKG today - overall looking ok.  I'd like to refer you to the heart doctor for further evaluation of chest pain. If chest pain recurs, go to the ER.  Nitroglycerin refilled.  We will check brain MRI for further evaluation of this intermittent numbness to left side of body. I'm also checking some vitamin levels. We will be in touch with results.  Let us know if any worsening.  I'd like to change blood pressure medicines - drop atenolol to '25mg'$  once daily, increase amlodipine to '5mg'$  daily - new doses sent to pharmacy.   Follow up plan: Return if symptoms worsen or fail to improve.  Ria Bush, MD

## 2020-11-29 ENCOUNTER — Encounter: Payer: Self-pay | Admitting: Cardiology

## 2020-11-29 ENCOUNTER — Ambulatory Visit (INDEPENDENT_AMBULATORY_CARE_PROVIDER_SITE_OTHER): Payer: Medicare Other | Admitting: Cardiology

## 2020-11-29 VITALS — BP 142/60 | HR 65 | Ht 59.0 in | Wt 151.4 lb

## 2020-11-29 DIAGNOSIS — R079 Chest pain, unspecified: Secondary | ICD-10-CM | POA: Diagnosis not present

## 2020-11-29 DIAGNOSIS — I10 Essential (primary) hypertension: Secondary | ICD-10-CM

## 2020-11-29 DIAGNOSIS — I351 Nonrheumatic aortic (valve) insufficiency: Secondary | ICD-10-CM | POA: Diagnosis not present

## 2020-11-29 DIAGNOSIS — I6523 Occlusion and stenosis of bilateral carotid arteries: Secondary | ICD-10-CM

## 2020-11-29 DIAGNOSIS — R072 Precordial pain: Secondary | ICD-10-CM | POA: Diagnosis not present

## 2020-11-29 DIAGNOSIS — I340 Nonrheumatic mitral (valve) insufficiency: Secondary | ICD-10-CM

## 2020-11-29 MED ORDER — ATENOLOL 25 MG PO TABS: 25.0000 mg | ORAL_TABLET | Freq: Every day | ORAL | 3 refills | Status: DC

## 2020-11-29 MED ORDER — HYDRALAZINE HCL 25 MG PO TABS
25.0000 mg | ORAL_TABLET | Freq: Two times a day (BID) | ORAL | 3 refills | Status: DC | PRN
Start: 2020-11-29 — End: 2021-01-08

## 2020-11-29 MED ORDER — HYDROCHLOROTHIAZIDE 25 MG PO TABS: 25.0000 mg | ORAL_TABLET | Freq: Every day | ORAL | 3 refills | Status: DC

## 2020-11-29 MED ORDER — AMLODIPINE BESYLATE 5 MG PO TABS: 5.0000 mg | ORAL_TABLET | Freq: Every day | ORAL | 3 refills | Status: DC

## 2020-11-29 NOTE — Addendum Note (Signed)
Addended by: Antonieta Iba on: 11/29/2020 08:47 AM   Modules accepted: Orders

## 2020-11-29 NOTE — Addendum Note (Signed)
Addended by: Sueanne Margarita on: 11/29/2020 09:51 AM   Modules accepted: Orders

## 2020-11-29 NOTE — Patient Instructions (Signed)
Medication Instructions:  Your physician recommends that you continue on your current medications as directed. Please refer to the Current Medication list given to you today.  *If you need a refill on your cardiac medications before your next appointment, please call your pharmacy*  Testing/Procedures: Your physician has requested that you have a lexiscan myoview. For further information please visit [email or web address]. Please follow instruction sheet, as given.  Your physician has requested that you have an echocardiogram. Echocardiography is a painless test that uses sound waves to create images of your heart. It provides your doctor with information about the size and shape of your heart and how well your heart's chambers and valves are working. This procedure takes approximately one hour. There are no restrictions for this procedure.   Follow-Up: At [facility], you and your health needs are our priority.  As part of our continuing mission to provide you with exceptional heart care, we have created designated Provider Care Teams.  These Care Teams include your primary Cardiologist (physician) and Advanced Practice Providers (APPs -  Physician Assistants and Nurse Practitioners) who all work together to provide you with the care you need, when you need it.  Your next appointment:   1 year(s)  The format for your next appointment:   In Person  Provider:   You may see Fransico Him, MD or one of the following Advanced Practice Providers on your designated Care Team:   Melina Copa, PA-C Ermalinda Barrios, PA-C

## 2020-12-03 ENCOUNTER — Other Ambulatory Visit: Payer: Self-pay | Admitting: Family Medicine

## 2020-12-03 MED ORDER — CYANOCOBALAMIN 500 MCG PO TABS: 500.0000 ug | ORAL_TABLET | Freq: Every day | ORAL | Status: DC

## 2020-12-03 MED ORDER — B-12 1000 MCG SL SUBL: 1.0000 | SUBLINGUAL_TABLET | Freq: Every day | SUBLINGUAL | Status: DC

## 2020-12-03 NOTE — Addendum Note (Signed)
Addended by: Ria Bush on: 12/03/2020 08:00 AM   Modules accepted: Orders

## 2020-12-04 LAB — VITAMIN B1: Vitamin B1 (Thiamine): 13 nmol/L (ref 8–30)

## 2020-12-05 ENCOUNTER — Ambulatory Visit: Payer: Medicare Other

## 2020-12-12 ENCOUNTER — Other Ambulatory Visit: Payer: Self-pay

## 2020-12-12 ENCOUNTER — Ambulatory Visit
Admission: RE | Admit: 2020-12-12 | Discharge: 2020-12-12 | Disposition: A | Discharge status: Home / Self Care | Payer: Medicare Other | Source: Ambulatory Visit | Attending: Family Medicine | Admitting: Family Medicine

## 2020-12-12 DIAGNOSIS — R200 Anesthesia of skin: Secondary | ICD-10-CM | POA: Diagnosis not present

## 2020-12-14 ENCOUNTER — Other Ambulatory Visit: Payer: Medicare Other

## 2020-12-17 ENCOUNTER — Telehealth (HOSPITAL_COMMUNITY): Payer: Self-pay

## 2020-12-17 NOTE — Telephone Encounter (Signed)
Spoke wit the patient, detailed instructions given. She stated that she would be here for her test. Asked to call back with any questions. S.Shyah Cadmus EMTP

## 2020-12-20 ENCOUNTER — Other Ambulatory Visit: Payer: Self-pay

## 2020-12-20 ENCOUNTER — Ambulatory Visit (HOSPITAL_BASED_OUTPATIENT_CLINIC_OR_DEPARTMENT_OTHER): Payer: Medicare Other

## 2020-12-20 ENCOUNTER — Ambulatory Visit (HOSPITAL_COMMUNITY): Payer: Medicare Other | Attending: Internal Medicine

## 2020-12-20 DIAGNOSIS — I10 Essential (primary) hypertension: Secondary | ICD-10-CM

## 2020-12-20 DIAGNOSIS — R079 Chest pain, unspecified: Secondary | ICD-10-CM

## 2020-12-20 DIAGNOSIS — I351 Nonrheumatic aortic (valve) insufficiency: Secondary | ICD-10-CM | POA: Insufficient documentation

## 2020-12-20 DIAGNOSIS — I340 Nonrheumatic mitral (valve) insufficiency: Secondary | ICD-10-CM | POA: Diagnosis not present

## 2020-12-20 DIAGNOSIS — I6523 Occlusion and stenosis of bilateral carotid arteries: Secondary | ICD-10-CM

## 2020-12-20 DIAGNOSIS — R072 Precordial pain: Secondary | ICD-10-CM | POA: Insufficient documentation

## 2020-12-20 LAB — ECHOCARDIOGRAM COMPLETE
Area-P 1/2: 3.99 cm2
Height: 59 in
P 1/2 time: 392 msec
S' Lateral: 2.7 cm
Weight: 2416 oz

## 2020-12-20 LAB — MYOCARDIAL PERFUSION IMAGING
LV dias vol: 11 mL (ref 46–106)
LV sys vol: 49 mL
Nuc Stress EF: 78 %
Peak HR: 94 {beats}/min
Rest HR: 70 {beats}/min
Rest Nuclear Isotope Dose: 10.1 mCi
SDS: 2
SRS: 0
SSS: 2
ST Depression (mm): 0 mm
Stress Nuclear Isotope Dose: 33 mCi
TID: 0.89

## 2020-12-20 MED ORDER — TECHNETIUM TC 99M TETROFOSMIN IV KIT
10.1000 | PACK | Freq: Once | INTRAVENOUS | Status: AC | PRN
  Administered 2020-12-20: 10.1 via INTRAVENOUS
  Filled 2020-12-20: qty 11

## 2020-12-20 MED ORDER — TECHNETIUM TC 99M TETROFOSMIN IV KIT
33.0000 | PACK | Freq: Once | INTRAVENOUS | Status: AC | PRN
  Administered 2020-12-20: 33 via INTRAVENOUS
  Filled 2020-12-20: qty 33

## 2020-12-20 MED ORDER — REGADENOSON 0.4 MG/5ML IV SOLN
0.4000 mg | Freq: Once | INTRAVENOUS | Status: AC
  Administered 2020-12-20: 0.4 mg via INTRAVENOUS

## 2020-12-25 ENCOUNTER — Telehealth: Payer: Self-pay | Admitting: *Deleted

## 2020-12-25 DIAGNOSIS — R200 Anesthesia of skin: Secondary | ICD-10-CM

## 2020-12-25 NOTE — Telephone Encounter (Signed)
PLEASE NOTE: All timestamps contained within this report are represented as [place] Standard Time. CONFIDENTIALTY NOTICE: This fax transmission is intended only for the addressee. It contains information that is legally privileged, confidential or otherwise protected from use or disclosure. If you are not the intended recipient, you are strictly prohibited from reviewing, disclosing, copying using or disseminating any of this information or taking any action in reliance on or regarding this information. If you have received this fax in error, please notify us immediately by telephone so that we can arrange for its return to [place]. Phone: [phone], Toll-Free: [phone], Fax: [phone] Page: 1 of 2 Call Id: [id number] [facility] - Client TELEPHONE ADVICE RECORD AccessNurse Patient Name: Priscilla [name] Gender: Female DOB: 1931/02/28 Age: 87 Y 6 D Return Phone Number: [phone] (Primary) Address: City/ State/ Zip: [place] [address] Client [facility] - Client Client Site [place] Physician Ria Bush - MD Contact Type Call Who Is Calling Patient / Member / Family / Caregiver Call Type Triage / Clinical Relationship To Patient Self Return Phone Number [phone] (Primary) Chief Complaint NUMBNESS/TINGLING- sudden on one side of the body or face Reason for Call Symptomatic / Request for [place] states that she is transferring a patient to be triaged. The patient is having left side numbness in her fingers, face and jaw. Translation No Nurse Assessment Nurse: Gildardo Pounds, RN, Amy Date/Time (Eastern Time): 12/25/2020 12:12:33 PM Confirm and document reason for call. If symptomatic, describe symptoms. ---Caller states she is still having left sided numbness in her fingers. It started out in the thumb & forefinger. It has now spread to the other 3  fingers & the left side of her lip & let side of her tongue. She had this before & came to see Dr. Darnell Level. on 9/7. Since then, she has had an MRI of the brain on the 21st. On MyChart, Dr. Darnell Level said the brain scan looked normal, but did not elaborate on the numbness. She did have a stress test & Echo done on the 29th, but does not have those results yet. Does the patient have any new or worsening symptoms? ---Yes Will a triage be completed? ---Yes Related visit to physician within the last 2 weeks? ---No Does the PT have any chronic conditions? (i.e. diabetes, asthma, this includes High risk factors for pregnancy, etc.) ---Yes List chronic conditions. ---HTN, Is this a behavioral health or substance abuse call? ---No Guidelines Guideline Title Affirmed Question Affirmed Notes Nurse Date/Time Eilene Ghazi Time) Neurologic Deficit Back pain (and neurologic deficit) Lovelace, RN, Amy 12/25/2020 12:14:59 PM PLEASE NOTE: All timestamps contained within this report are represented as [place] Standard Time. CONFIDENTIALTY NOTICE: This fax transmission is intended only for the addressee. It contains information that is legally privileged, confidential or otherwise protected from use or disclosure. If you are not the intended recipient, you are strictly prohibited from reviewing, disclosing, copying using or disseminating any of this information or taking any action in reliance on or regarding this information. If you have received this fax in error, please notify us immediately by telephone so that we can arrange for its return to [place]. Phone: [phone], Toll-Free: [phone], Fax: [phone] Page: 2 of 2 Call Id: [id number] [place]. Time Eilene Ghazi Time) Disposition Final User 12/25/2020 12:06:23 PM Send to Urgent Queue Wynema Birch 12/25/2020 12:19:47 PM See HCP within 4 Hours (or PCP triage) Yes Lovelace, RN, Amy Caller Disagree/Comply  Comply Caller Understands Yes PreDisposition  InappropriateToAsk Care Advice Given Per Guideline SEE HCP (OR PCP TRIAGE) WITHIN 4 HOURS: * IF OFFICE WILL BE OPEN: You need to be seen within the next 3 or 4 hours. Call your doctor (or NP/PA) now or as soon as the office opens. CARE ADVICE given per Neurologic Deficit (Adult) guideline. Comments User: Wayne Sever, RN Date/Time Eilene Ghazi Time): 12/25/2020 12:30:04 PM Patient hung up before could warm transfer her for a possible appt. Notified [place] at the office of patient condition & 4 Hour Outcome. She is going to have one of the office nurses reach out to her. Referrals REFERRED TO PCP OFFICE

## 2020-12-25 NOTE — Telephone Encounter (Signed)
Access nurse called requesting that our office call the patient back because she hung up.while talking to them.  Called and spoke to patient by telephone and was advised that this numbness has been going on for several months and Dr. Danise Mina is aware of the numbness. Patient stated that she had an MRI done and was not sure of the results. Patient was read Dr. Bosie Clos notes. Patient stated that the numbness is on the left side of her body. Patient stated that the numbness was in one of her fingers and now has moved to three other fingers. Patient stated that she thinks that this is being caused by a nerve in her neck. Patient denies blurred vision, slurred speak or any other symptoms. Patient stated that she has an upcoming appointment scheduled with Dr. Danise Mina next month and will just talk with him at that time. Patient was given ER precautions and she verbalized understanding. Patient was advised that this message will go back to Dr. Danise Mina so that he will be aware and will let her know what she may need to do before her upcoming appointment.

## 2020-12-26 NOTE — Telephone Encounter (Signed)
Pt called back returning your call °

## 2020-12-26 NOTE — Telephone Encounter (Signed)
Is she having any neck pain or shooting pain down the arm?  I'd like to proceed with cervical neck xray to start. We can discuss neck MRI as vs nerve conduction study as next step if progressing symptoms.  Where is most convenient for her to get an xray?

## 2020-12-26 NOTE — Telephone Encounter (Signed)
Lvm asking pt to call back. Need to get answers to Dr. G's questions.  

## 2020-12-27 NOTE — Telephone Encounter (Signed)
Xray ordered to [facility]

## 2020-12-27 NOTE — Addendum Note (Signed)
Addended by: Ria Bush on: 12/27/2020 04:38 PM   Modules accepted: Orders

## 2020-12-27 NOTE — Telephone Encounter (Signed)
Pt returning call.    Pt denies having any neck pain or shooting pain down the arm.  I relayed Dr. Synthia Innocent message.  Pt verbalizes understanding and agrees to cervical xray.  She prefers to go to [facility].  Says she recently had MRI at [address] location, so she knows where that is.

## 2020-12-27 NOTE — Telephone Encounter (Signed)
Lvm asking pt to call back. Need to get answers to Dr. G's questions.  

## 2020-12-27 NOTE — Telephone Encounter (Signed)
Noted  

## 2020-12-31 DIAGNOSIS — M5416 Radiculopathy, lumbar region: Secondary | ICD-10-CM | POA: Diagnosis not present

## 2020-12-31 DIAGNOSIS — M25552 Pain in left hip: Secondary | ICD-10-CM | POA: Diagnosis not present

## 2020-12-31 DIAGNOSIS — M5459 Other low back pain: Secondary | ICD-10-CM | POA: Diagnosis not present

## 2020-12-31 DIAGNOSIS — M1712 Unilateral primary osteoarthritis, left knee: Secondary | ICD-10-CM | POA: Diagnosis not present

## 2021-01-07 ENCOUNTER — Other Ambulatory Visit: Payer: Self-pay | Admitting: Cardiology

## 2021-01-14 ENCOUNTER — Telehealth: Payer: Self-pay

## 2021-01-14 ENCOUNTER — Other Ambulatory Visit: Payer: Self-pay

## 2021-01-14 ENCOUNTER — Telehealth (INDEPENDENT_AMBULATORY_CARE_PROVIDER_SITE_OTHER): Payer: Medicare Other | Admitting: Nurse Practitioner

## 2021-01-14 VITALS — BP 128/58 | HR 87 | Temp 97.6°F

## 2021-01-14 DIAGNOSIS — U071 COVID-19: Secondary | ICD-10-CM | POA: Diagnosis not present

## 2021-01-14 HISTORY — DX: COVID-19: U07.1

## 2021-01-14 MED ORDER — MOLNUPIRAVIR EUA 200MG CAPSULE: 4.0000 | ORAL_CAPSULE | Freq: Two times a day (BID) | ORAL | 0 refills | Status: AC

## 2021-01-14 NOTE — Telephone Encounter (Signed)
Noted. Will evaluate patient at appointment time

## 2021-01-14 NOTE — Telephone Encounter (Signed)
[facility] - Client TELEPHONE ADVICE RECORD [facility] Patient Name: Priscilla [name] Gender: Female DOB: 31-May-1930 Age: 87 Y 26 D Return Phone Number: [phone] (Primary), [phone] (Secondary) Address: [place]/ [place] [address] Client [facility] - Client Client Site [place] Physician Ria Bush - MD Contact Type Call Who Is Calling Patient / Member / Family / Caregiver Call Type Triage / Clinical Caller Name Orvis Brill Relationship To Patient Daughter Return Phone Number [phone] (Primary) Chief Complaint Fever (non-urgent symptom) (greater than THREE MONTHS old) Reason for Call Symptomatic / Request for [place] states her mom tested positive for Covid and she is calling for medication. She is nauseated, fever of 100.6, body aches. Translation No Nurse Assessment Nurse: Lucky Cowboy, RN, Levada Dy Date/Time (Eastern Time): 01/14/2021 8:48:04 AM Confirm and document reason for call. If symptomatic, describe symptoms. ---Caller stated that her mom has COVID, s/s started yesterday. She has nausea, temp 100.6 orally, and body aches. She just finished Prednisone Sat, but these s/s started yesterday. Does the patient have any new or worsening symptoms? ---Yes Will a triage be completed? ---Yes Related visit to physician within the last 2 weeks? ---No Does the PT have any chronic conditions? (i.e. diabetes, asthma, this includes High risk factors for pregnancy, etc.) ---Yes List chronic conditions. ---"lot of health issues" and wants [place] to check her chart Is this a behavioral health or substance abuse call? ---No Guidelines Guideline Title Affirmed Question Affirmed Notes Nurse Date/Time ([place] Time) COVID-19 - Diagnosed or Suspected [1] HIGH RISK for severe COVID complications (e.g., weak immune system, age >  52 years, obesity with BMI 30 or higher, Dew, RN, Levada Dy 01/14/2021 8:52:00 AM PLEASE NOTE: All timestamps contained within this report are represented as [place] Standard Time. CONFIDENTIALTY NOTICE: This fax transmission is intended only for the addressee. It contains information that is legally privileged, confidential or otherwise protected from use or disclosure. If you are not the intended recipient, you are strictly prohibited from reviewing, disclosing, copying using or disseminating any of this information or taking any action in reliance on or regarding this information. If you have received this fax in error, please notify us immediately by telephone so that we can arrange for its return to [place]. Phone: [phone], Toll-Free: [phone], Fax: [phone] Page: 2 of 3 Call Id: [id number] Guidelines Guideline Title Affirmed Question Affirmed Notes Nurse Date/Time [place] Ghazi Time) pregnant, chronic lung disease or other chronic medical condition) AND [2] COVID symptoms (e.g., cough, fever) (Exceptions: Already seen by PCP and no new or worsening symptoms.) Disp. Time Eilene Ghazi Time) Disposition Final User 01/14/2021 8:57:37 AM Call PCP within 24 Hours Yes Dew, RN, Marin Shutter Disagree/Comply Comply Caller Understands Yes PreDisposition Call Doctor Care Advice Given Per Guideline CALL PCP WITHIN 24 HOURS: * You need to discuss this with your doctor (or NP/PA) within the next 24 hours. * IF OFFICE WILL BE OPEN: Call the office when it opens tomorrow morning. GENERAL CARE ADVICE FOR COVID-19 SYMPTOMS: * The symptoms are generally treated the same whether you have COVID-19, influenza or some other respiratory virus. * Cough: Use cough drops. * Feeling dehydrated: Drink extra liquids. If the air in your home is dry, use a humidifier. * Fever: For fever over 101 F (38.3 C), take acetaminophen every 4 to 6 hours (Adults 650 mg) OR ibuprofen every 6 to 8 hours (Adults 400 mg).  Before taking any medicine,  read all the instructions on the package. Do not take aspirin unless your doctor has prescribed it for you. * Muscle aches, headache, and other pains: Often this comes and goes with the fever. Take acetaminophen every 4 to 6 hours (Adults 650 mg) OR ibuprofen every 6 to 8 hours (Adults 400 mg). Before taking any medicine, read all the instructions on the package. * Sore throat: Try throat lozenges, hard candy or warm chicken broth. COVID-19 - HOW TO PROTECT OTHERS - WHEN YOU ARE SICK WITH COVID-19: * STAY HOME A MINIMUM OF 5 DAYS: People with MILD COVID-19 can STOP HOME ISOLATION AFTER 5 DAYS if (1) fever has been gone for 24 hours (without using fever medicine) AND (2) symptoms are better. Continue to wear a well-fitted mask for a full 10 days when around others. * WEAR A MASK FOR 10 DAYS: Wear a well-fitted mask for 10 full days any time you are around others inside your home or in public. Do not go to places where you are unable to wear a mask. * AVOID TRAVEL: Avoid travel for 10 days after you tested positive for COVID-19. * [place] HANDS OFTEN: Wash hands often with soap and water. After coughing or sneezing are important times. If soap and water are not available, use an alcohol-based hand sanitizer with at least 60% alcohol, covering all surfaces of your hands and rubbing them together until they feel dry. Avoid touching your eyes, nose, and mouth with unwashed hands. * CALL AHEAD IF MEDICAL CARE IS NEEDED: If you have a medical appointment, call your doctor's office and tell them you have or may have COVID-19. This will help the office protect themselves and other patients. They will give you directions. CALL BACK IF: * You become worse CARE ADVICE given per COVID-19 - DIAGNOSED OR SUSPECTED (Adult) guideline. PLEASE NOTE: All timestamps contained within this report are represented as [place] Standard Time. CONFIDENTIALTY NOTICE: This fax transmission is intended  only for the addressee. It contains information that is legally privileged, confidential or otherwise protected from use or disclosure. If you are not the intended recipient, you are strictly prohibited from reviewing, disclosing, copying using or disseminating any of this information or taking any action in reliance on or regarding this information. If you have received this fax in error, please notify us immediately by telephone so that we can arrange for its return to [place]. Phone: [phone], Toll-Free: [phone], Fax: [phone] Page: 3 of 3 Call Id: [id number] Referrals REFERRED TO PCP OFFIC

## 2021-01-14 NOTE — Telephone Encounter (Signed)
Per appt notes pt already has video visit scheduled on 01/14/21 at 2 PM with Romilda Garret NP. Per access nurse note prior to appt pt's daughter was given pt care advice if condition changes or worsens prior to appt. Sending note to Jannette Spanner NP and Anastasiya CMA.

## 2021-01-14 NOTE — Assessment & Plan Note (Signed)
Patient tested positive for COVID-19 at home.  Has been vaccinated with [place] booster.  Did discuss emergency use authorized only medications and decided to use molnupiravir.  Patient and daughter are in agreements.  Did discuss signs and symptoms when to seek urgent or emergent health care.  They acknowledged.  Did review CDC guidelines in regards to quarantine.  Patient may continue taking over-the-counter Tylenol and other medications for symptomatic relief.  Start molnupiravir soon as possible.

## 2021-01-14 NOTE — Progress Notes (Signed)
Patient ID: Priscilla [name], female    DOB: [birth year], 87 y.o.   MRN: [id number]  Virtual visit completed through [place], a video enabled telemedicine application. Due to national recommendations of social distancing due to COVID-19, a virtual visit is felt to be most appropriate for this patient at this time. Reviewed limitations, risks, security and privacy concerns of performing a virtual visit and the availability of in person appointments. I also reviewed that there may be a patient responsible charge related to this service. The patient agreed to proceed.   Patient location: home Provider location: [place] [facility], office Persons participating in this virtual visit: patient, provider, daughter  If any vitals were documented, they were collected by patient at home unless specified below.    BP (!) 128/58 Comment: per patient  Pulse 87   Temp 97.6 F (36.4 C)    CC: Covid 19 Infection  Subjective:   HPI: Priscilla [name] is a 87 y.o. female presenting on 01/14/2021 for Covid Positive (On 01/14/21, sx started 01/13/21. Sx are chills, body aches, nausea, headache, fever-100.6.)    Symptoms on 01/13/2021 Tested for covid at home test and was positive Pfizer vaccine x2 and a booster Have used Tylenol, nausea medication from a previous shoulder surgery, zofran    Relevant past medical, surgical, family and social history reviewed and updated as indicated. Interim medical history since our last visit reviewed. Allergies and medications reviewed and updated. Outpatient Medications Prior to Visit  Medication Sig Dispense Refill   acetaminophen (TYLENOL) 500 MG tablet Take 500-1,000 mg by mouth every 6 (six) hours as needed for moderate pain or headache.     amLODipine (NORVASC) 5 MG tablet Take 1 tablet (5 mg total) by mouth daily. 90 tablet 3   aspirin EC 81 MG tablet Take 81 mg by mouth daily.     atenolol (TENORMIN) 25 MG tablet Take 1 tablet (25 mg total) by  mouth daily. 90 tablet 3   Biotin 5 MG CAPS Take 5 mg by mouth at bedtime.      Calcium Carb-Cholecalciferol (CALCIUM 600 + D PO) Take 1 tablet by mouth at bedtime.     calcium carbonate (TUMS - DOSED IN MG ELEMENTAL CALCIUM) 500 MG chewable tablet Chew 1 tablet by mouth 2 (two) times daily as needed for indigestion or heartburn.     Cyanocobalamin (B-12) 1000 MCG SUBL Place 1 tablet under the tongue daily.     D3-50 1.25 MG (50000 UT) capsule TAKE 1 CAPSULE BY MOUTH ONE TIME PER WEEK 12 capsule 3   ferrous sulfate 325 (65 FE) MG tablet Take 325 mg by mouth daily with breakfast.     hydrALAZINE (APRESOLINE) 25 MG tablet TAKE 1 TABLET (25 MG TOTAL) BY MOUTH 2 (TWO) TIMES DAILY AS NEEDED ((BP>160/100)). 45 tablet 3   hydrochlorothiazide (HYDRODIURIL) 25 MG tablet Take 1 tablet (25 mg total) by mouth daily. 90 tablet 3   nitroGLYCERIN (NITROSTAT) 0.4 MG SL tablet Place 1 tablet (0.4 mg total) under the tongue every 5 (five) minutes as needed for chest pain. 25 tablet 1   omeprazole (PRILOSEC) 20 MG capsule TAKE 1 CAPSULE BY MOUTH EVERY DAY 90 capsule 1   No facility-administered medications prior to visit.     Per HPI unless specifically indicated in ROS section below Review of Systems  Constitutional:  Positive for chills, fatigue and fever.  HENT:  Negative for congestion, ear discharge, ear pain, postnasal drip and sore throat.  Respiratory:  Positive for shortness of breath (DOE). Negative for cough.   Cardiovascular:  Negative for chest pain.  Gastrointestinal:  Positive for nausea and vomiting (5). Negative for abdominal pain.  Musculoskeletal:  Positive for arthralgias and myalgias.  Neurological:  Positive for headaches.  Objective:  BP (!) 128/58 Comment: per patient  Pulse 87   Temp 97.6 F (36.4 C)   Wt Readings from Last 3 Encounters:  12/20/20 151 lb (68.5 kg)  11/29/20 151 lb 6.4 oz (68.7 kg)  11/28/20 151 lb 6 oz (68.7 kg)       Physical exam: Gen: alert, NAD, not  ill appearing Pulm: speaks in complete sentences without increased work of breathing Psych: normal mood, normal thought content      Results for orders placed or performed in visit on 12/20/20  ECHOCARDIOGRAM COMPLETE  Result Value Ref Range   Weight 2,416 oz   Height 59 in   Area-P 1/2 3.99 cm2   S' Lateral 2.70 cm   P 1/2 time 392 msec   Assessment & Plan:   Problem List Items Addressed This Visit       Other   COVID-19 - Primary    Patient tested positive for COVID-19 at home.  Has been vaccinated with [place] booster.  Did discuss emergency use authorized only medications and decided to use molnupiravir.  Patient and daughter are in agreements.  Did discuss signs and symptoms when to seek urgent or emergent health care.  They acknowledged.  Did review CDC guidelines in regards to quarantine.  Patient may continue taking over-the-counter Tylenol and other medications for symptomatic relief.  Start molnupiravir soon as possible.      Relevant Medications   molnupiravir EUA (LAGEVRIO) 200 mg CAPS capsule     No orders of the defined types were placed in this encounter.  No orders of the defined types were placed in this encounter.   I discussed the assessment and treatment plan with the patient. The patient was provided an opportunity to ask questions and all were answered. The patient agreed with the plan and demonstrated an understanding of the instructions. The patient was advised to call back or seek an in-person evaluation if the symptoms worsen or if the condition fails to improve as anticipated.  Follow up plan: No follow-ups on file.  Romilda Garret, NP

## 2021-01-23 ENCOUNTER — Telehealth: Payer: Self-pay | Admitting: Family Medicine

## 2021-01-23 NOTE — Telephone Encounter (Signed)
Spoke with pt going over CDC quarantine/isolation guidelines.  Pt verbalizes understanding.

## 2021-01-23 NOTE — Telephone Encounter (Signed)
Pt called in stating that she had a virtual visit with cable and he gave her medication for five days and told her to quarantine. So it has been ten days and pt want to know if she can go in public

## 2021-02-01 ENCOUNTER — Ambulatory Visit: Payer: Medicare Other

## 2021-02-03 NOTE — Progress Notes (Signed)
Subjective:   Priscilla [name] is a 87 y.o. female who presents for Medicare Annual (Subsequent) preventive examination.  I connected with Candyce Gambino today by telephone and verified that I am speaking with the correct person using two identifiers. Location patient: home Location provider: work Persons participating in the virtual visit: patient, Marine scientist.    I discussed the limitations, risks, security and privacy concerns of performing an evaluation and management service by telephone and the availability of in person appointments. I also discussed with the patient that there may be a patient responsible charge related to this service. The patient expressed understanding and verbally consented to this telephonic visit.    Interactive audio and video telecommunications were attempted between this provider and patient, however failed, due to patient having technical difficulties OR patient did not have access to video capability.  We continued and completed visit with audio only.  Some vital signs may be absent or patient reported.   Time Spent with patient on telephone encounter: 25 minutes Review of Systems     Cardiac Risk Factors include: advanced age (>42men, >30 women);hypertension     Objective:    Today's Vitals   02/04/21 1117  Weight: 151 lb (68.5 kg)  Height: 4\' 11"  (1.499 m)   Body mass index is 30.5 kg/m.  Advanced Directives 02/04/2021 01/31/2020 07/28/2019 07/19/2019 10/28/2017 10/31/2014 05/20/2012  Does Patient Have a Medical Advance Directive? Yes Yes Yes Yes Yes No Patient has advance directive, copy not in chart  Type of Advance Directive [place];Living will [place];Living will Living will;Healthcare Power of Attorney Living will;Healthcare Power of [place];Living will - -  Does patient want to make changes to medical advance directive? Yes (MAU/Ambulatory/Procedural Areas - Information given) - No  - Patient declined - - - -  Copy of [place] in Chart? Yes - validated most recent copy scanned in chart (See row information) Yes - validated most recent copy scanned in chart (See row information) No - copy requested - Yes - -  Would patient like information on creating a medical advance directive? - - - - - No - patient declined information -  Pre-existing out of facility DNR order (yellow form or pink MOST form) - - - - - - -    Current Medications (verified) Outpatient Encounter Medications as of 02/04/2021  Medication Sig   acetaminophen (TYLENOL) 500 MG tablet Take 500-1,000 mg by mouth every 6 (six) hours as needed for moderate pain or headache.   amLODipine (NORVASC) 5 MG tablet Take 1 tablet (5 mg total) by mouth daily.   aspirin EC 81 MG tablet Take 81 mg by mouth daily.   atenolol (TENORMIN) 25 MG tablet Take 1 tablet (25 mg total) by mouth daily.   Biotin 5 MG CAPS Take 5 mg by mouth at bedtime.    Calcium Carb-Cholecalciferol (CALCIUM 600 + D PO) Take 1 tablet by mouth at bedtime.   calcium carbonate (TUMS - DOSED IN MG ELEMENTAL CALCIUM) 500 MG chewable tablet Chew 1 tablet by mouth 2 (two) times daily as needed for indigestion or heartburn.   Cyanocobalamin (B-12) 1000 MCG SUBL Place 1 tablet under the tongue daily.   D3-50 1.25 MG (50000 UT) capsule TAKE 1 CAPSULE BY MOUTH ONE TIME PER WEEK   ferrous sulfate 325 (65 FE) MG tablet Take 325 mg by mouth daily with breakfast.   hydrALAZINE (APRESOLINE) 25 MG tablet TAKE 1 TABLET (  25 MG TOTAL) BY MOUTH 2 (TWO) TIMES DAILY AS NEEDED ((BP>160/100)).   hydrochlorothiazide (HYDRODIURIL) 25 MG tablet Take 1 tablet (25 mg total) by mouth daily.   nitroGLYCERIN (NITROSTAT) 0.4 MG SL tablet Place 1 tablet (0.4 mg total) under the tongue every 5 (five) minutes as needed for chest pain.   omeprazole (PRILOSEC) 20 MG capsule TAKE 1 CAPSULE BY MOUTH EVERY DAY   [DISCONTINUED] CALCIUM-VITAMIN D PO Take 1 tablet by mouth 2  (two) times daily.   No facility-administered encounter medications on file as of 02/04/2021.    Allergies (verified) Morphine and related, Lisinopril, and Losartan   History: Past Medical History:  Diagnosis Date   Aortic insufficiency    mild on echo 2020   B12 deficiency 03/25/2014   H/o small bowel obstruction with resection as well as colorectal cancer s/p partial colectomy.  Receives B12 injections    Cancer ([place]) 2001, 2008   colon, rectal   Carotid stenosis, asymptomatic, bilateral    1-39% by dopplers 2020   Chest pain, atypical    no ischemia on nuclear stress test 2019   History of chicken pox    History of Clostridium difficile 2012   History of DVT (deep vein thrombosis) 2002   right leg, after chemo for first cancer, treated   Hypertension    IDA (iron deficiency anemia)    Open wound of abdominal wall with complication    Osteoarthritis    Osteopenia 09/2011   DEXA 2013 spine -2.2, femur -1.6, DEXA 12/2015 - hip T score -1.9   SBO (small bowel obstruction) ([place]) 02/05/2011   On 02/05/11 she had enterolysis and small bowel resection    Past Surgical History:  Procedure Laterality Date   [place] RESECTION  02/05/2011   Procedure: SMALL BOWEL RESECTION;  Surgeon: Harl Bowie, MD;  Location: [facility];  Service: General;  Laterality: N/A;   broken nose     CARDIOVASCULAR STRESS TEST  11/2015   WNL ([place])   COLECTOMY     COLON SURGERY  2001 and 2008   COLONOSCOPY  12/2012   erosions at colonic anastomosis, o/w normal rpt 5 yrs (Dr. Penelope Coop at [facility])   COLONOSCOPY  01/2018   WNL no rpt recommended ([place])   COLOSTOMY     DEXA  09/2011   T-score: spine -2.2, femur -1.6   ESOPHAGOGASTRODUODENOSCOPY  01/31/2011   Procedure: ESOPHAGOGASTRODUODENOSCOPY (EGD);  Surgeon: Missy Sabins, MD;  [facility] END[facility]COPY;  Service: Endoscopy;  Laterality: N/A;  duodenal bx;s r/o giardia, r/o celiac diease   EYE SURGERY   2018   cataract surgery   EYE SURGERY  2019   laser surgery   FLEXIBLE SIGMOIDOSCOPY  01/31/2011   Procedure: FLEXIBLE SIGMOIDOSCOPY;  Surgeon: Missy Sabins, MD;  Location: [facility];  Service: Endoscopy;  Laterality: N/A;   ILEOSTOMY     JOINT REPLACEMENT  2010   knee    LAPAROTOMY  02/05/2011   Procedure: EXPLORATORY LAPAROTOMY;  Surgeon: Harl Bowie, MD;  Location: [facility];  Service: General;  Laterality: N/A;  lysis of adhesions.   NUCLEAR STRESS TEST  11/17/08   NORMAL   REPLACEMENT TOTAL KNEE  2010   RIGHT KNEE   REVERSE SHOULDER ARTHROPLASTY Right 07/28/2019   Procedure: REVERSE SHOULDER ARTHROPLASTY;  Surgeon: Justice Britain, MD;  Location: [facility];  Service: Orthopedics;  Laterality: Right;  exparel   WOUND DEBRIDEMENT N/A 05/21/2012  Procedure: Excision non healing ABDOMINAL WOUND;  Surgeon: Harl Bowie, MD;  Location: [facility];  Service: General;  Laterality: N/A;   Family History  Problem Relation Age of Onset   Heart disease Mother        valve problems   Heart attack Mother    Congestive Heart Failure Mother    Heart disease Father        due to lupus   Lupus Father    Heart attack Father    Colon cancer Paternal Grandmother    Coronary artery disease Brother        s/p CABG   Coronary artery disease Sister        s/p CABG   Heart attack Brother    Heart attack Sister    Diabetes Neg Hx    Stroke Neg Hx    Hypertension Neg Hx    Social History   Socioeconomic History   Marital status: Widowed    Spouse name: Not on file   Number of children: Not on file   Years of education: Not on file   Highest education level: Not on file  Occupational History   Not on file  Tobacco Use   Smoking status: Never   Smokeless tobacco: Never  Vaping Use   Vaping Use: Never used  Substance and Sexual Activity   Alcohol use: No   Drug use: No   Sexual activity: Not Currently  Other Topics Concern   Not on file  Social History Narrative   Caffeine: decaf    Lives alone, daughter lives nearby to visit.   Occupation: retired, worked at [employer] office   Edu: HS   Activity: tries to get walking, walks up and down steps at home   Diet: good water, fruits/vegetables daily.  1 cup milk/day, cheese.      Cards: Dr. Mare Ferrari   Surgeon: Dr. Rush Farmer   Social Determinants of Health   Financial Resource Strain: Low Risk    Difficulty of Paying Living Expenses: Not hard at all  Food Insecurity: No Food Insecurity   Worried About Charity fundraiser in the Last Year: Never true   [place] in the Last Year: Never true  Transportation Needs: No Transportation Needs   Lack of Transportation (Medical): No   Lack of Transportation (Non-Medical): No  Physical Activity: Insufficiently Active   Days of Exercise per Week: 7 days   Minutes of Exercise per Session: 20 min  Stress: No Stress Concern Present   Feeling of Stress : Not at all  Social Connections: Moderately Integrated   Frequency of Communication with Friends and Family: More than three times a week   Frequency of Social Gatherings with Friends and Family: More than three times a week   Attends Religious Services: More than 4 times per year   Active Member of [employer] or Organizations: Yes   Attends Archivist Meetings: More than 4 times per year   Marital Status: Widowed    Tobacco Counseling Counseling given: Not Answered   Clinical Intake:  Pre-visit preparation completed: Yes  Pain : No/denies pain     BMI - recorded: 30.56 Nutritional Status: BMI > 30  Obese Nutritional Risks: None Diabetes: No  How often do you need to have someone help you when you read instructions, pamphlets, or other written materials from your doctor or pharmacy?: 1 - Never  Diabetic?No  Interpreter Needed?: No  Information entered by :: Orrin Brigham LPN  Activities of Daily Living In your present state of health, do you have any difficulty performing the following  activities: 02/04/2021  Hearing? N  Vision? N  Walking or climbing stairs? N  Dressing or bathing? N  Doing errands, shopping? N  Preparing Food and eating ? N  Using the Toilet? N  In the past six months, have you accidently leaked urine? Y  Comment leakage at night  Do you have problems with loss of bowel control? N  Managing your Medications? N  Managing your Finances? N  Housekeeping or managing your Housekeeping? N  Some recent data might be hidden    Patient Care Team: Ria Bush, MD as PCP - General (Family Medicine) Sueanne Margarita, MD as PCP - Cardiology (Cardiology)  Indicate any recent Medical Services you may have received from other than [facility] providers in the past year (date may be approximate).     Assessment:   This is a routine wellness examination for Anthea.  Hearing/Vision screen Hearing Screening - Comments:: No issues Vision Screening - Comments:: Last exam was 2021, has an exam scheduled for 11/22, Dr. Satira Sark wears readers  Dietary issues and exercise activities discussed: Current Exercise Habits: The patient does not participate in regular exercise at present;Home exercise routine, Type of exercise: strength training/weights;walking (leg strengthening), Time (Minutes): 25, Frequency (Times/Week): 7, Weekly Exercise (Minutes/Week): 175, Intensity: Mild   Goals Addressed             This Visit's Progress    Patient Stated       Would like to continue drink more water and be a little more cautious about cholesterol.       Depression Screen PHQ 2/9 Scores 02/04/2021 01/31/2020 11/11/2018 10/28/2017 09/29/2016 09/21/2015 09/19/2014  PHQ - 2 Score 0 0 0 0 0 0 0  PHQ- 9 Score - 0 - 0 - - -    Fall Risk Fall Risk  02/04/2021 01/31/2020 11/11/2018 10/28/2017 09/29/2016  Falls in the past year? 1 0 1 Yes Yes  Comment - - - foot caught on vine in garden causing patient to fall -  Number falls in past yr: 0 0 1 1 1   Injury with Fall? 0 0 1 No -  Risk for  fall due to : Other (Comment) Medication side effect - - -  Risk for fall due to: Comment missed a step - - - -  Follow up Falls prevention discussed Falls evaluation completed;Falls prevention discussed - - -    FALL RISK PREVENTION PERTAINING TO THE HOME:  Any stairs in or around the home? Yes  If so, are there any without handrails? No  Home free of loose throw rugs in walkways, pet beds, electrical cords, etc? Yes  Adequate lighting in your home to reduce risk of falls? Yes   ASSISTIVE DEVICES UTILIZED TO PREVENT FALLS:  Life alert? No  Use of a cane, walker or w/c? Yes  Grab bars in the bathroom? No  Shower chair or bench in shower? Yes  Elevated toilet seat or a handicapped toilet? Yes   TIMED UP AND GO:  Was the test performed? No , visit completed over the phone.   Cognitive Function: Normal cognitive status assessed by this Nurse Health Advisor. No abnormalities found.   MMSE - Mini Mental State Exam 01/31/2020 10/28/2017  Orientation to time 5 5  Orientation to Place 5 5  Registration 3 3  Attention/ Calculation 5 0  Recall 3 3  Language- name 2  objects - 0  Language- repeat 1 1  Language- follow 3 step command - 3  Language- read & follow direction - 0  Write a sentence - 0  Copy design - 0  Total score - 20        Immunizations Immunization History  Administered Date(s) Administered   Fluad Quad(high Dose 65+) 12/23/2019   Influenza Split 12/11/2011, 12/23/2014   Influenza, High Dose Seasonal PF 01/12/2017, 01/27/2018   Influenza,inj,Quad PF,6+ Mos 12/25/2015, 11/11/2018   PFIZER(Purple Top)SARS-COV-2 Vaccination 04/20/2019, 05/11/2019, 01/03/2020   Pneumococcal Conjugate-13 09/12/2013   Pneumococcal Polysaccharide-23 03/25/2007   Td 09/11/2011   Zoster, Live 05/09/2014    TDAP status: Up to date  Flu Vaccine status: Due, Education has been provided regarding the importance of this vaccine. Advised may receive this vaccine at local pharmacy or  Health Dept. Aware to provide a copy of the vaccination record if obtained from local pharmacy or Health Dept. Verbalized acceptance and understanding.  Pneumococcal vaccine status: Up to date  Covid-19 vaccine status: Declined, Education has been provided regarding the importance of this vaccine but patient still declined. Advised may receive this vaccine at local pharmacy or Health Dept.or vaccine clinic. Aware to provide a copy of the vaccination record if obtained from local pharmacy or Health Dept. Verbalized acceptance and understanding.  Qualifies for Shingles Vaccine? Yes   Zostavax completed Yes   Shingrix Completed?: No.    Education has been provided regarding the importance of this vaccine. Patient has been advised to call insurance company to determine out of pocket expense if they have not yet received this vaccine. Advised may also receive vaccine at local pharmacy or Health Dept. Verbalized acceptance and understanding.  Screening Tests Health Maintenance  Topic Date Due   Zoster Vaccines- Shingrix (1 of 2) Never done   COVID-19 Vaccine (4 - Booster for Pfizer series) 02/28/2020   INFLUENZA VACCINE  10/22/2020   MAMMOGRAM  04/12/2021   TETANUS/TDAP  09/10/2021   COLONOSCOPY (Pts 45-67yrs Insurance coverage will need to be confirmed)  01/26/2023   Pneumonia Vaccine 59+ Years old  Completed   DEXA SCAN  Completed   HPV VACCINES  Aged Out    Health Maintenance  Health Maintenance Due  Topic Date Due   Zoster Vaccines- Shingrix (1 of 2) Never done   COVID-19 Vaccine (4 - Booster for Pfizer series) 02/28/2020   INFLUENZA VACCINE  10/22/2020    Colorectal cancer screening: No longer required.   Mammogram status: Completed 04/12/20. Repeat every year  Bone Density status: No longer required  Lung Cancer Screening: (Low Dose CT Chest recommended if Age 1-80 years, 30 pack-year currently smoking OR have quit w/in 15years.) does not qualify.     Additional  Screening:  Hepatitis C Screening: does not qualify;   Vision Screening: Recommended annual ophthalmology exams for early detection of glaucoma and other disorders of the eye. Is the patient up to date with their annual eye exam?  Yes  Who is the provider or what is the name of the office in which the patient attends annual eye exams? Dr. Satira Sark   Dental Screening: Recommended annual dental exams for proper oral hygiene  Community Resource Referral / Chronic Care Management: CRR required this visit?  No   CCM required this visit?  No      Plan:     I have personally reviewed and noted the following in the patient's chart:   Medical and social history Use of alcohol, tobacco or  illicit drugs  Current medications and supplements including opioid prescriptions.  Functional ability and status Nutritional status Physical activity Advanced directives List of other physicians Hospitalizations, surgeries, and ER visits in previous 12 months Vitals Screenings to include cognitive, depression, and falls Referrals and appointments  In addition, I have reviewed and discussed with patient certain preventive protocols, quality metrics, and best practice recommendations. A written personalized care plan for preventive services as well as general preventive health recommendations were provided to patient.    Due to this being a telephonic visit, the after visit summary with patients personalized plan was offered to patient via mail or my-chart. Patient would like to access on my-chart.    Loma Messing, LPN 27/08/2374   Nurse Health Advisor  Nurse Notes: None

## 2021-02-04 ENCOUNTER — Ambulatory Visit (INDEPENDENT_AMBULATORY_CARE_PROVIDER_SITE_OTHER): Payer: Medicare Other

## 2021-02-04 VITALS — Ht 59.0 in | Wt 151.0 lb

## 2021-02-04 DIAGNOSIS — Z0000 Encounter for general adult medical examination without abnormal findings: Secondary | ICD-10-CM | POA: Diagnosis not present

## 2021-02-04 NOTE — Patient Instructions (Signed)
Priscilla [name] , Thank you for taking time to complete your Medicare Wellness Visit. I appreciate your ongoing commitment to your health goals. Please review the following plan we discussed and let me know if I can assist you in the future.   Screening recommendations/referrals: Colonoscopy: no longer required Mammogram: up to date, completed 04/12/20, due 04/12/21 Bone Density: no longer required Recommended yearly ophthalmology/optometry visit for glaucoma screening and checkup Recommended yearly dental visit for hygiene and checkup  Vaccinations: Influenza vaccine: Due-May obtain vaccine at our office or your local pharmacy. Pneumococcal vaccine: up to date Tdap vaccine: up to date, completed 09/11/11, due 09/10/21 Shingles vaccine: Discuss with your local pharmacy    Covid-19:newest booster available at your local pharmacy  Advanced directives: copy on file  Conditions/risks identified: see problem list  Next appointment: Follow up in one year for your annual wellness visit    Preventive Care 65 Years and Older, Female Preventive care refers to lifestyle choices and visits with your health care provider that can promote health and wellness. What does preventive care include? A yearly physical exam. This is also called an annual well check. Dental exams once or twice a year. Routine eye exams. Ask your health care provider how often you should have your eyes checked. Personal lifestyle choices, including: Daily care of your teeth and gums. Regular physical activity. Eating a healthy diet. Avoiding tobacco and drug use. Limiting alcohol use. Practicing safe sex. Taking low-dose aspirin every day. Taking vitamin and mineral supplements as recommended by your health care provider. What happens during an annual well check? The services and screenings done by your health care provider during your annual well check will depend on your age, overall health, lifestyle risk factors, and  family history of disease. Counseling  Your health care provider may ask you questions about your: Alcohol use. Tobacco use. Drug use. Emotional well-being. Home and relationship well-being. Sexual activity. Eating habits. History of falls. Memory and ability to understand (cognition). Work and work Statistician. Reproductive health. Screening  You may have the following tests or measurements: Height, weight, and BMI. Blood pressure. Lipid and cholesterol levels. These may be checked every 5 years, or more frequently if you are over 60 years old. Skin check. Lung cancer screening. You may have this screening every year starting at age 73 if you have a 30-pack-year history of smoking and currently smoke or have quit within the past 15 years. Fecal occult blood test (FOBT) of the stool. You may have this test every year starting at age 73. Flexible sigmoidoscopy or colonoscopy. You may have a sigmoidoscopy every 5 years or a colonoscopy every 10 years starting at age 48. Hepatitis C blood test. Hepatitis B blood test. Sexually transmitted disease (STD) testing. Diabetes screening. This is done by checking your blood sugar (glucose) after you have not eaten for a while (fasting). You may have this done every 1-3 years. Bone density scan. This is done to screen for osteoporosis. You may have this done starting at age 83. Mammogram. This may be done every 1-2 years. Talk to your health care provider about how often you should have regular mammograms. Talk with your health care provider about your test results, treatment options, and if necessary, the need for more tests. Vaccines  Your health care provider may recommend certain vaccines, such as: Influenza vaccine. This is recommended every year. Tetanus, diphtheria, and acellular pertussis (Tdap, Td) vaccine. You may need a Td booster every 10 years. Zoster vaccine. You  may need this after age 49. Pneumococcal 13-valent conjugate  (PCV13) vaccine. One dose is recommended after age 60. Pneumococcal polysaccharide (PPSV23) vaccine. One dose is recommended after age 74. Talk to your health care provider about which screenings and vaccines you need and how often you need them. This information is not intended to replace advice given to you by your health care provider. Make sure you discuss any questions you have with your health care provider. Document Released: 04/06/2015 Document Revised: 11/28/2015 Document Reviewed: 01/09/2015 Elsevier Interactive Patient Education  2017 [place] Prevention in the Home Falls can cause injuries. They can happen to people of all ages. There are many things you can do to make your home safe and to help prevent falls. What can I do on the outside of my home? Regularly fix the edges of walkways and driveways and fix any cracks. Remove anything that might make you trip as you walk through a door, such as a raised step or threshold. Trim any bushes or trees on the path to your home. Use bright outdoor lighting. Clear any walking paths of anything that might make someone trip, such as rocks or tools. Regularly check to see if handrails are loose or broken. Make sure that both sides of any steps have handrails. Any raised decks and porches should have guardrails on the edges. Have any leaves, snow, or ice cleared regularly. Use sand or salt on walking paths during winter. Clean up any spills in your garage right away. This includes oil or grease spills. What can I do in the bathroom? Use night lights. Install grab bars by the toilet and in the tub and shower. Do not use towel bars as grab bars. Use non-skid mats or decals in the tub or shower. If you need to sit down in the shower, use a plastic, non-slip stool. Keep the floor dry. Clean up any water that spills on the floor as soon as it happens. Remove soap buildup in the tub or shower regularly. Attach bath mats securely with  double-sided non-slip rug tape. Do not have throw rugs and other things on the floor that can make you trip. What can I do in the bedroom? Use night lights. Make sure that you have a light by your bed that is easy to reach. Do not use any sheets or blankets that are too big for your bed. They should not hang down onto the floor. Have a firm chair that has side arms. You can use this for support while you get dressed. Do not have throw rugs and other things on the floor that can make you trip. What can I do in the kitchen? Clean up any spills right away. Avoid walking on wet floors. Keep items that you use a lot in easy-to-reach places. If you need to reach something above you, use a strong step stool that has a grab bar. Keep electrical cords out of the way. Do not use floor polish or wax that makes floors slippery. If you must use wax, use non-skid floor wax. Do not have throw rugs and other things on the floor that can make you trip. What can I do with my stairs? Do not leave any items on the stairs. Make sure that there are handrails on both sides of the stairs and use them. Fix handrails that are broken or loose. Make sure that handrails are as long as the stairways. Check any carpeting to make sure that it is firmly  attached to the stairs. Fix any carpet that is loose or worn. Avoid having throw rugs at the top or bottom of the stairs. If you do have throw rugs, attach them to the floor with carpet tape. Make sure that you have a light switch at the top of the stairs and the bottom of the stairs. If you do not have them, ask someone to add them for you. What else can I do to help prevent falls? Wear shoes that: Do not have high heels. Have rubber bottoms. Are comfortable and fit you well. Are closed at the toe. Do not wear sandals. If you use a stepladder: Make sure that it is fully opened. Do not climb a closed stepladder. Make sure that both sides of the stepladder are locked  into place. Ask someone to hold it for you, if possible. Clearly mark and make sure that you can see: Any grab bars or handrails. First and last steps. Where the edge of each step is. Use tools that help you move around (mobility aids) if they are needed. These include: Canes. Walkers. Scooters. Crutches. Turn on the lights when you go into a dark area. Replace any light bulbs as soon as they burn out. Set up your furniture so you have a clear path. Avoid moving your furniture around. If any of your floors are uneven, fix them. If there are any pets around you, be aware of where they are. Review your medicines with your doctor. Some medicines can make you feel dizzy. This can increase your chance of falling. Ask your doctor what other things that you can do to help prevent falls. This information is not intended to replace advice given to you by your health care provider. Make sure you discuss any questions you have with your health care provider. Document Released: 01/04/2009 Document Revised: 08/16/2015 Document Reviewed: 04/14/2014 Elsevier Interactive Patient Education  2017 [employer].

## 2021-02-08 DIAGNOSIS — Z23 Encounter for immunization: Secondary | ICD-10-CM | POA: Diagnosis not present

## 2021-02-12 ENCOUNTER — Other Ambulatory Visit: Payer: Self-pay

## 2021-02-12 ENCOUNTER — Encounter: Payer: Self-pay | Admitting: Family Medicine

## 2021-02-12 ENCOUNTER — Ambulatory Visit (INDEPENDENT_AMBULATORY_CARE_PROVIDER_SITE_OTHER): Payer: Medicare Other | Admitting: Family Medicine

## 2021-02-12 VITALS — BP 160/62 | HR 81 | Temp 97.4°F | Ht 59.75 in | Wt 150.2 lb

## 2021-02-12 DIAGNOSIS — E538 Deficiency of other specified B group vitamins: Secondary | ICD-10-CM | POA: Diagnosis not present

## 2021-02-12 DIAGNOSIS — Z85038 Personal history of other malignant neoplasm of large intestine: Secondary | ICD-10-CM

## 2021-02-12 DIAGNOSIS — I440 Atrioventricular block, first degree: Secondary | ICD-10-CM | POA: Diagnosis not present

## 2021-02-12 DIAGNOSIS — I10 Essential (primary) hypertension: Secondary | ICD-10-CM

## 2021-02-12 DIAGNOSIS — D539 Nutritional anemia, unspecified: Secondary | ICD-10-CM | POA: Diagnosis not present

## 2021-02-12 DIAGNOSIS — I6523 Occlusion and stenosis of bilateral carotid arteries: Secondary | ICD-10-CM | POA: Diagnosis not present

## 2021-02-12 DIAGNOSIS — M85859 Other specified disorders of bone density and structure, unspecified thigh: Secondary | ICD-10-CM

## 2021-02-12 DIAGNOSIS — N289 Disorder of kidney and ureter, unspecified: Secondary | ICD-10-CM

## 2021-02-12 DIAGNOSIS — E559 Vitamin D deficiency, unspecified: Secondary | ICD-10-CM | POA: Diagnosis not present

## 2021-02-12 LAB — RENAL FUNCTION PANEL
Albumin: 4.2 g/dL (ref 3.5–5.2)
BUN: 20 mg/dL (ref 6–23)
CO2: 28 mEq/L (ref 19–32)
Calcium: 9.8 mg/dL (ref 8.4–10.5)
Chloride: 100 mEq/L (ref 96–112)
Creatinine, Ser: 0.84 mg/dL (ref 0.40–1.20)
GFR: 62.16 mL/min (ref >60.00)
Glucose, Bld: 100 mg/dL — ABNORMAL HIGH (ref 70–99)
Phosphorus: 3.2 mg/dL (ref 2.3–4.6)
Potassium: 4.2 mEq/L (ref 3.5–5.1)
Sodium: 137 mEq/L (ref 135–145)

## 2021-02-12 LAB — IBC PANEL
Iron: 120 ug/dL (ref 42–145)
Saturation Ratios: 32.6 % (ref 20.0–50.0)
TIBC: 368.2 ug/dL (ref 250.0–450.0)
Transferrin: 263 mg/dL (ref 212.0–360.0)

## 2021-02-12 LAB — FERRITIN: Ferritin: 64.1 ng/mL (ref 10.0–291.0)

## 2021-02-12 LAB — VITAMIN D 25 HYDROXY (VIT D DEFICIENCY, FRACTURES): VITD: 84.29 ng/mL (ref 30.00–100.00)

## 2021-02-12 MED ORDER — D3-50 1.25 MG (50000 UT) PO CAPS: 50000.0000 [IU] | ORAL_CAPSULE | ORAL | 3 refills | Status: DC

## 2021-02-12 MED ORDER — AMLODIPINE BESYLATE 10 MG PO TABS: 10.0000 mg | ORAL_TABLET | Freq: Every day | ORAL | 3 refills | Status: DC

## 2021-02-12 MED ORDER — FERROUS SULFATE 325 (65 FE) MG PO TABS
325.0000 mg | ORAL_TABLET | ORAL | Status: AC
Start: 2021-02-12 — End: ?

## 2021-02-12 NOTE — Patient Instructions (Addendum)
Labs today  Let's increase amlodipine to 10mg  - full dose, watch for ankle swelling and let me know if this happens. Take hydralazine 25mg  twice daily as needed for elevated blood pressures.  Drop iron to every other day - we will check levels today.  Return as needed or in 6 months for blood pressure follow up.   Health Maintenance After Age 85 After age 84, you are at a higher risk for certain long-term diseases and infections as well as injuries from falls. Falls are a major cause of broken bones and head injuries in people who are older than age 10. Getting regular preventive care can help to keep you healthy and well. Preventive care includes getting regular testing and making lifestyle changes as recommended by your health care provider. Talk with your health care provider about: Which screenings and tests you should have. A screening is a test that checks for a disease when you have no symptoms. A diet and exercise plan that is right for you. What should I know about screenings and tests to prevent falls? Screening and testing are the best ways to find a health problem early. Early diagnosis and treatment give you the best chance of managing medical conditions that are common after age 26. Certain conditions and lifestyle choices may make you more likely to have a fall. Your health care provider may recommend: Regular vision checks. Poor vision and conditions such as cataracts can make you more likely to have a fall. If you wear glasses, make sure to get your prescription updated if your vision changes. Medicine review. Work with your health care provider to regularly review all of the medicines you are taking, including over-the-counter medicines. Ask your health care provider about any side effects that may make you more likely to have a fall. Tell your health care provider if any medicines that you take make you feel dizzy or sleepy. Strength and balance checks. Your health care provider may  recommend certain tests to check your strength and balance while standing, walking, or changing positions. Foot health exam. Foot pain and numbness, as well as not wearing proper footwear, can make you more likely to have a fall. Screenings, including: Osteoporosis screening. Osteoporosis is a condition that causes the bones to get weaker and break more easily. Blood pressure screening. Blood pressure changes and medicines to control blood pressure can make you feel dizzy. Depression screening. You may be more likely to have a fall if you have a fear of falling, feel depressed, or feel unable to do activities that you used to do. Alcohol use screening. Using too much alcohol can affect your balance and may make you more likely to have a fall. Follow these instructions at home: Lifestyle Do not drink alcohol if: Your health care provider tells you not to drink. If you drink alcohol: Limit how much you have to: 0-1 drink a day for women. 0-2 drinks a day for men. Know how much alcohol is in your drink. In the [place] one drink equals one 12 oz bottle of beer (355 mL), one 5 oz glass of wine (148 mL), or one 1 oz glass of hard liquor (44 mL). Do not use any products that contain nicotine or tobacco. These products include cigarettes, chewing tobacco, and vaping devices, such as e-cigarettes. If you need help quitting, ask your health care provider. Activity  Follow a regular exercise program to stay fit. This will help you maintain your balance. Ask your health care provider  what types of exercise are appropriate for you. If you need a cane or walker, use it as recommended by your health care provider. Wear supportive shoes that have nonskid soles. Safety  Remove any tripping hazards, such as rugs, cords, and clutter. Install safety equipment such as grab bars in bathrooms and safety rails on stairs. Keep rooms and walkways well-lit. General instructions Talk with your health care provider  about your risks for falling. Tell your health care provider if: You fall. Be sure to tell your health care provider about all falls, even ones that seem minor. You feel dizzy, tiredness (fatigue), or off-balance. Take over-the-counter and prescription medicines only as told by your health care provider. These include supplements. Eat a healthy diet and maintain a healthy weight. A healthy diet includes low-fat dairy products, low-fat (lean) meats, and fiber from whole grains, beans, and lots of fruits and vegetables. Stay current with your vaccines. Schedule regular health, dental, and eye exams. Summary Having a healthy lifestyle and getting preventive care can help to protect your health and wellness after age 24. Screening and testing are the best way to find a health problem early and help you avoid having a fall. Early diagnosis and treatment give you the best chance for managing medical conditions that are more common for people who are older than age 51. Falls are a major cause of broken bones and head injuries in people who are older than age 79. Take precautions to prevent a fall at home. Work with your health care provider to learn what changes you can make to improve your health and wellness and to prevent falls. This information is not intended to replace advice given to you by your health care provider. Make sure you discuss any questions you have with your health care provider. Document Revised: 07/30/2020 Document Reviewed: 07/30/2020 Elsevier Patient Education  [place].

## 2021-02-12 NOTE — Progress Notes (Signed)
Patient ID: Priscilla [name], female    DOB: 11/08/1930, 87 y.o.   MRN: [id number]  This visit was conducted in person.  BP (!) 160/62   Pulse 81   Temp (!) 97.4 F (36.3 C) (Temporal)   Ht 4' 11.75" (1.518 m)   Wt 150 lb 4 oz (68.2 kg)   SpO2 98%   BMI 29.59 kg/m   BP Readings from Last 3 Encounters:  02/12/21 (!) 160/62  01/14/21 (!) 128/58  11/29/20 (!) 142/60    CC: CPE Subjective:   HPI: Priscilla [name] is a 87 y.o. female presenting on 02/12/2021 for Annual Exam (Prt 2.  Wants to discuss supplements she's taking. )   Saw health advisor last week for medicare wellness visit. Note reviewed.   No results found.  Flowsheet Row Clinical Support from 02/04/2021 in [place] at [place]  PHQ-2 Total Score 0       Fall Risk  02/04/2021 01/31/2020 11/11/2018 10/28/2017 09/29/2016  Falls in the past year? 1 0 1 Yes Yes  Comment - - - foot caught on vine in garden causing patient to fall -  Number falls in past yr: 0 0 1 1 1   Injury with Fall? 0 0 1 No -  Risk for fall due to : Other (Comment) Medication side effect - - -  Risk for fall due to: Comment missed a step - - - -  Follow up Falls prevention discussed Falls evaluation completed;Falls prevention discussed - - -  Suffered fall this past summer going up step in the rain, tripped upwards.   COVID infection 12/2020 treated with molnupiravir. She had post-COVID fatigue but it seems to be improving.   Recent precordial chest pain s/p evaluation by cardiology with reassuring echocardiogram and stress test. Chest pain has since improved. Reflux managed with omeprazole 20mg  daily and PRN tums.   Known white coat hypertension. Home readings run 140-150/50s, HR 85-90. She continues amlodipine, atenolol 25mg  daily, hctz 25mg  daily, and hydralazine 25mg  BID PRN.   Preventative: H/o colon and rectal cancer. H/o bowel resection after SBO.  COLONOSCOPY Date: 12/2012 erosions at colonic anastomosis, o/w normal rpt 5  yrs (Dr. Penelope Coop at [place]).  COLONOSCOPY 01/2018 - WNL no rpt recommended Penelope Coop)  Well woman - aged out of pap smears.  Mammogram - 03/2020 Birads1 @ [facility]. Discussed Q32yr mammo  DEXA 12/2015 - hip T score -1.9. agrees to repeat given recent humeral fracture.  DEXA 03/2020 - L femur neck -1.6  Flu - yearly [place] 03/2019, 04/2019, 12/2019  Td - 08/2011  Peumovax 2009. Prevnar-13 08/2013 zostavax 04/2014 shingrix - discussed, declines  Advanced directives - copy in chart 08/2015. HCPOA would be son Clare Gandy) and daughter Lelan Pons), also would be ok with other children helping. Ok with CPR, intubation and limited trial for reversible conditions, does not want prolonged life support if terminal condition.  Seat belt use discussed Sunscreen use discussed. No changing moles on skin. Non smoker Alcohol - none Dentist - yearly Eye exam - yearly  Bowel - no constipation, chronic intermittent diarrhea after GI surgeries Bladder - notes increasing incontinence over months - nocturia x3-4, with accidents at night. Uses bed pad. No stress incontinence or daytime symptoms.    Caffeine: decaf   Lives alone, daughter lives nearby to visit. Widow for 16 yrs   Occupation: retired, worked at [employer] office   Edu: HS   Activity: yardwork, walks up and down steps at home  Diet: good water, fruits/vegetables daily. 1 cup milk/day, cheese      Relevant past medical, surgical, family and social history reviewed and updated as indicated. Interim medical history since our last visit reviewed. Allergies and medications reviewed and updated. Outpatient Medications Prior to Visit  Medication Sig Dispense Refill   acetaminophen (TYLENOL) 500 MG tablet Take 500-1,000 mg by mouth every 6 (six) hours as needed for moderate pain or headache.     aspirin EC 81 MG tablet Take 81 mg by mouth daily.     atenolol (TENORMIN) 25 MG tablet Take 1 tablet (25 mg total) by mouth daily. 90 tablet 3   Biotin 5 MG CAPS Take 5  mg by mouth at bedtime.      Calcium Carb-Cholecalciferol (CALCIUM 600 + D PO) Take 1 tablet by mouth at bedtime.     calcium carbonate (TUMS - DOSED IN MG ELEMENTAL CALCIUM) 500 MG chewable tablet Chew 1 tablet by mouth 2 (two) times daily as needed for indigestion or heartburn.     Cyanocobalamin (B-12) 1000 MCG SUBL Place 1 tablet under the tongue daily.     hydrALAZINE (APRESOLINE) 25 MG tablet TAKE 1 TABLET (25 MG TOTAL) BY MOUTH 2 (TWO) TIMES DAILY AS NEEDED ((BP>160/100)). 45 tablet 3   hydrochlorothiazide (HYDRODIURIL) 25 MG tablet Take 1 tablet (25 mg total) by mouth daily. 90 tablet 3   nitroGLYCERIN (NITROSTAT) 0.4 MG SL tablet Place 1 tablet (0.4 mg total) under the tongue every 5 (five) minutes as needed for chest pain. 25 tablet 1   omeprazole (PRILOSEC) 20 MG capsule TAKE 1 CAPSULE BY MOUTH EVERY DAY 90 capsule 1   amLODipine (NORVASC) 5 MG tablet Take 1 tablet (5 mg total) by mouth daily. 90 tablet 3   D3-50 1.25 MG (50000 UT) capsule TAKE 1 CAPSULE BY MOUTH ONE TIME PER WEEK 12 capsule 3   ferrous sulfate 325 (65 FE) MG tablet Take 325 mg by mouth daily with breakfast.     No facility-administered medications prior to visit.     Per HPI unless specifically indicated in ROS section below Review of Systems  Objective:  BP (!) 160/62   Pulse 81   Temp (!) 97.4 F (36.3 C) (Temporal)   Ht 4' 11.75" (1.518 m)   Wt 150 lb 4 oz (68.2 kg)   SpO2 98%   BMI 29.59 kg/m   Wt Readings from Last 3 Encounters:  02/12/21 150 lb 4 oz (68.2 kg)  02/04/21 151 lb (68.5 kg)  12/20/20 151 lb (68.5 kg)      Physical Exam Vitals and nursing note reviewed.  Constitutional:      Appearance: Normal appearance. She is not ill-appearing.  HENT:     Head: Normocephalic and atraumatic.     Right Ear: Tympanic membrane, ear canal and external ear normal. There is no impacted cerumen.     Left Ear: Tympanic membrane, ear canal and external ear normal. There is no impacted cerumen.  Eyes:      General:        Right eye: No discharge.        Left eye: No discharge.     Extraocular Movements: Extraocular movements intact.     Conjunctiva/sclera: Conjunctivae normal.     Pupils: Pupils are equal, round, and reactive to light.  Neck:     Thyroid: No thyroid mass or thyromegaly.     Vascular: No carotid bruit.  Cardiovascular:     Rate and Rhythm: Normal  rate and regular rhythm.     Pulses: Normal pulses.     Heart sounds: Normal heart sounds. No murmur heard. Pulmonary:     Effort: Pulmonary effort is normal. No respiratory distress.     Breath sounds: Normal breath sounds. No wheezing, rhonchi or rales.  Abdominal:     General: Bowel sounds are normal. There is no distension.     Palpations: Abdomen is soft. There is no mass.     Tenderness: There is no abdominal tenderness. There is no guarding or rebound.     Hernia: No hernia is present.  Musculoskeletal:     Cervical back: Normal range of motion and neck supple. No rigidity.     Right lower leg: No edema.     Left lower leg: No edema.  Lymphadenopathy:     Cervical: No cervical adenopathy.  Skin:    General: Skin is warm and dry.     Findings: No rash.  Neurological:     General: No focal deficit present.     Mental Status: She is alert. Mental status is at baseline.  Psychiatric:        Mood and Affect: Mood normal.        Behavior: Behavior normal.      Results for orders placed or performed in visit on 02/12/21  Renal function panel  Result Value Ref Range   Sodium 137 135 - 145 mEq/L   Potassium 4.2 3.5 - 5.1 mEq/L   Chloride 100 96 - 112 mEq/L   CO2 28 19 - 32 mEq/L   Albumin 4.2 3.5 - 5.2 g/dL   BUN 20 6 - 23 mg/dL   Creatinine, Ser 0.84 0.40 - 1.20 mg/dL   Glucose, Bld 100 (H) 70 - 99 mg/dL   Phosphorus 3.2 2.3 - 4.6 mg/dL   GFR 62.16 >60.00 mL/min   Calcium 9.8 8.4 - 10.5 mg/dL  Ferritin  Result Value Ref Range   Ferritin 64.1 10.0 - 291.0 ng/mL  IBC panel  Result Value Ref Range   Iron  120 42 - 145 ug/dL   Transferrin 263.0 212.0 - 360.0 mg/dL   Saturation Ratios 32.6 20.0 - 50.0 %   TIBC 368.2 250.0 - 450.0 mcg/dL  VITAMIN D 25 Hydroxy (Vit-D Deficiency, Fractures)  Result Value Ref Range   VITD 84.29 30.00 - 100.00 ng/mL   Lab Results  Component Value Date   VITAMINB12 373 11/28/2020   Assessment & Plan:  This visit occurred during the SARS-CoV-2 public health emergency.  Safety protocols were in place, including screening questions prior to the visit, additional usage of staff PPE, and extensive cleaning of exam room while observing appropriate contact time as indicated for disinfecting solutions.   Problem List Items Addressed This Visit     History of colon cancer    Latest colonoscopy 2019 reassuring Penelope Coop), no f/u recommended.       Osteopenia    Reviewed latest DEXA with some improvement noted.  Continue regular calcium and vitamin D (weekly)      Anemia associated with nutritional deficiency    She has continue daily oral iron and daily oral B12. Will drop iron to QOD dosing and check levels of iron and B12 and then decide on ongoing dosing.       Relevant Medications   ferrous sulfate 325 (65 FE) MG tablet   Other Relevant Orders   Ferritin (Completed)   IBC panel (Completed)   Vitamin D deficiency  Update levels on oral 50k weekly.       Relevant Orders   VITAMIN D 25 Hydroxy (Vit-D Deficiency, Fractures) (Completed)   Vitamin B12 deficiency    Has been on daily oral replacement for last several months since we've been at new office. Previously was on monthly b12 injections. Will need levels checked next labwork to determine need for ongoing IM vs PO      White coat syndrome with hypertension    Persistently high readings in the office consistent with white coat hypertension, however home readings also running elevated.  Will increase amlodipine to 10mg  daily, change hydralazine to 25mg  BID PRN SBP >160. Monitor for pedal edema which  was previous side effect to higher amlodipine dose.  I asked her to continue monitoring BP at home and let me know if consistently above goal.       Relevant Medications   amLODipine (NORVASC) 10 MG tablet   First degree AV block    In h/o this, will not increase atenolol.       Relevant Medications   amLODipine (NORVASC) 10 MG tablet   Renal insufficiency - Primary    Latest GFR 40s, will recheck renal panel today and work towards better blood pressure control.       Relevant Orders   Renal function panel (Completed)     Meds ordered this encounter  Medications   Cholecalciferol (D3-50) 1.25 MG (50000 UT) capsule    Sig: Take 1 capsule (50,000 Units total) by mouth once a week.    Dispense:  12 capsule    Refill:  3   amLODipine (NORVASC) 10 MG tablet    Sig: Take 1 tablet (10 mg total) by mouth daily.    Dispense:  90 tablet    Refill:  3    Note new sig   ferrous sulfate 325 (65 FE) MG tablet    Sig: Take 1 tablet (325 mg total) by mouth every other day.    Orders Placed This Encounter  Procedures   Renal function panel   Ferritin   IBC panel   VITAMIN D 25 Hydroxy (Vit-D Deficiency, Fractures)    Patient instructions: Labs today  Let's increase amlodipine to 10mg  - full dose, watch for ankle swelling and let me know if this happens. Take hydralazine 25mg  twice daily as needed for elevated blood pressures.  Drop iron to every other day - we will check levels today.  Return as needed or in 6 months for blood pressure follow up.   Follow up plan: Return in about 6 months (around 08/12/2021), or if symptoms worsen or fail to improve, for follow up visit.  Ria Bush, MD

## 2021-02-16 ENCOUNTER — Encounter: Payer: Self-pay | Admitting: Family Medicine

## 2021-02-16 DIAGNOSIS — N289 Disorder of kidney and ureter, unspecified: Secondary | ICD-10-CM | POA: Insufficient documentation

## 2021-02-16 NOTE — Assessment & Plan Note (Addendum)
Has been on daily oral replacement for last several months since we've been at new office. Previously was on monthly b12 injections. Will need levels checked next labwork to determine need for ongoing IM vs PO

## 2021-02-16 NOTE — Assessment & Plan Note (Addendum)
Persistently high readings in the office consistent with white coat hypertension, however home readings also running elevated.  Will increase amlodipine to 10mg  daily, change hydralazine to 25mg  BID PRN SBP >160. Monitor for pedal edema which was previous side effect to higher amlodipine dose.  I asked her to continue monitoring BP at home and let me know if consistently above goal.

## 2021-02-16 NOTE — Assessment & Plan Note (Signed)
Reviewed latest DEXA with some improvement noted.  Continue regular calcium and vitamin D (weekly)

## 2021-02-16 NOTE — Assessment & Plan Note (Signed)
Latest GFR 40s, will recheck renal panel today and work towards better blood pressure control.

## 2021-02-16 NOTE — Assessment & Plan Note (Signed)
She has continue daily oral iron and daily oral B12. Will drop iron to QOD dosing and check levels of iron and B12 and then decide on ongoing dosing.

## 2021-02-16 NOTE — Assessment & Plan Note (Signed)
Update levels on oral 50k weekly.

## 2021-02-16 NOTE — Assessment & Plan Note (Signed)
Latest colonoscopy 2019 reassuring Priscilla [name]), no f/u recommended.

## 2021-02-16 NOTE — Assessment & Plan Note (Addendum)
In h/o this, will not increase atenolol.

## 2021-02-18 ENCOUNTER — Ambulatory Visit: Payer: Medicare Other | Admitting: Cardiology

## 2021-02-18 ENCOUNTER — Telehealth: Payer: Self-pay | Admitting: Family Medicine

## 2021-02-18 ENCOUNTER — Other Ambulatory Visit: Payer: Self-pay | Admitting: Family Medicine

## 2021-02-18 NOTE — Progress Notes (Signed)
10mg  amlodipine caused leg swelling - so dropped back to 5mg  daily.

## 2021-02-18 NOTE — Telephone Encounter (Signed)
Pt returning your call about pt lab results

## 2021-02-19 NOTE — Telephone Encounter (Addendum)
Lvm asking pt to call back (see Lab Result Notes, 02/12/21).

## 2021-02-19 NOTE — Telephone Encounter (Signed)
Attempted several times to call pt.  Line busy.  Will try again later.

## 2021-02-20 DIAGNOSIS — H43813 Vitreous degeneration, bilateral: Secondary | ICD-10-CM | POA: Diagnosis not present

## 2021-02-20 DIAGNOSIS — H04123 Dry eye syndrome of bilateral lacrimal glands: Secondary | ICD-10-CM | POA: Diagnosis not present

## 2021-02-20 DIAGNOSIS — H02403 Unspecified ptosis of bilateral eyelids: Secondary | ICD-10-CM | POA: Diagnosis not present

## 2021-03-11 DIAGNOSIS — M5459 Other low back pain: Secondary | ICD-10-CM | POA: Diagnosis not present

## 2021-03-11 DIAGNOSIS — M5416 Radiculopathy, lumbar region: Secondary | ICD-10-CM | POA: Diagnosis not present

## 2021-04-01 DIAGNOSIS — M1712 Unilateral primary osteoarthritis, left knee: Secondary | ICD-10-CM | POA: Diagnosis not present

## 2021-04-01 DIAGNOSIS — M48062 Spinal stenosis, lumbar region with neurogenic claudication: Secondary | ICD-10-CM | POA: Diagnosis not present

## 2021-04-01 DIAGNOSIS — M25562 Pain in left knee: Secondary | ICD-10-CM | POA: Diagnosis not present

## 2021-05-12 ENCOUNTER — Other Ambulatory Visit: Payer: Self-pay | Admitting: Family Medicine

## 2021-05-13 DIAGNOSIS — M5416 Radiculopathy, lumbar region: Secondary | ICD-10-CM | POA: Diagnosis not present

## 2021-05-17 ENCOUNTER — Telehealth: Payer: Self-pay | Admitting: Family Medicine

## 2021-05-17 NOTE — Telephone Encounter (Addendum)
BPs are looking really good at home. How are legs feeling and how bad is the swelling? If manageable, would have her continue full 10mg  amlodipine dose.  She could also take 10mg  amlodipine full tablet daily and then if swelling develops then drop to 1/2 tab for a few days to see if it improves.   BP Readings from Last 3 Encounters:  02/12/21 (!) 160/62  01/14/21 (!) 128/58  11/29/20 (!) 142/60

## 2021-05-17 NOTE — Telephone Encounter (Signed)
Patient says that the pain and swelling is manageable.  The left is a little more than the right.  It mostly goes away at night.  Patient is seeing an orthopedist who feels that the pain she has been having in her legs is more related to her back issues.   She will continue taking the 10mg  as suggested and let us know if any further issues and verbalizes understanding that she can cut back to 1/2 for a few days to monitor if needed.

## 2021-05-17 NOTE — Addendum Note (Signed)
Addended by: Ria Bush on: 05/17/2021 05:20 PM   Modules accepted: Orders

## 2021-05-17 NOTE — Telephone Encounter (Signed)
Priscilla [name] called in and stated that she is on the amlodpine 10mg  and her BP has been running 116/48 today 120/45 , 110/43 yesterday and wanted to know if she needs to cut it down due to her legs swell but it goes down overnight and not sure if its the medicine or vein problem

## 2021-05-31 DIAGNOSIS — R269 Unspecified abnormalities of gait and mobility: Secondary | ICD-10-CM | POA: Diagnosis not present

## 2021-05-31 DIAGNOSIS — M5451 Vertebrogenic low back pain: Secondary | ICD-10-CM | POA: Diagnosis not present

## 2021-06-05 DIAGNOSIS — R269 Unspecified abnormalities of gait and mobility: Secondary | ICD-10-CM | POA: Diagnosis not present

## 2021-06-05 DIAGNOSIS — M5451 Vertebrogenic low back pain: Secondary | ICD-10-CM | POA: Diagnosis not present

## 2021-06-10 DIAGNOSIS — R269 Unspecified abnormalities of gait and mobility: Secondary | ICD-10-CM | POA: Diagnosis not present

## 2021-06-10 DIAGNOSIS — M5451 Vertebrogenic low back pain: Secondary | ICD-10-CM | POA: Diagnosis not present

## 2021-06-13 DIAGNOSIS — R269 Unspecified abnormalities of gait and mobility: Secondary | ICD-10-CM | POA: Diagnosis not present

## 2021-06-13 DIAGNOSIS — M5451 Vertebrogenic low back pain: Secondary | ICD-10-CM | POA: Diagnosis not present

## 2021-06-17 DIAGNOSIS — R269 Unspecified abnormalities of gait and mobility: Secondary | ICD-10-CM | POA: Diagnosis not present

## 2021-06-17 DIAGNOSIS — M5451 Vertebrogenic low back pain: Secondary | ICD-10-CM | POA: Diagnosis not present

## 2021-06-18 ENCOUNTER — Telehealth: Payer: Self-pay

## 2021-06-18 NOTE — Telephone Encounter (Signed)
Pt called in stating that she needs a new Rx for a 5 mg amlodipine that she has been taking. She has a '10mg'$  on her med list but was instructed back at the end of November to split that in half due to swelling in her legs.  ?Pt states that her swelling is better but she is experiencing some wooziness. Pt gave me some BP readings from last few days: ?105/45 ?110/47 ?129/49 ?114/41 ?125/50 ?125/51 ? ? ?Please advise pt.  ?

## 2021-06-19 DIAGNOSIS — R269 Unspecified abnormalities of gait and mobility: Secondary | ICD-10-CM | POA: Diagnosis not present

## 2021-06-19 DIAGNOSIS — M5451 Vertebrogenic low back pain: Secondary | ICD-10-CM | POA: Diagnosis not present

## 2021-06-19 MED ORDER — HYDROCHLOROTHIAZIDE 12.5 MG PO TABS
12.5000 mg | ORAL_TABLET | Freq: Every day | ORAL | 1 refills | Status: DC
Start: 2021-06-19 — End: 2022-02-07

## 2021-06-19 MED ORDER — AMLODIPINE BESYLATE 5 MG PO TABS: 5.0000 mg | ORAL_TABLET | Freq: Every day | ORAL | 1 refills | Status: DC

## 2021-06-19 NOTE — Telephone Encounter (Signed)
Patient notified as instructed by telephone and verbalized understanding. 

## 2021-06-19 NOTE — Telephone Encounter (Signed)
Glad leg swelling is better on lower dose. I've sent in amlodipine '5mg'$  daily as new dose.  ? ?BP running a bit low actually and with woozy feeling, I recommend we drop hctz to 12.'5mg'$  dose - take 1/2 tablet of the '25mg'$  dose until she runs out, new dose at pharmacy will be 12.'5mg'$  daily.  ? ?Keep [place] updated with how she's doing/feeling on her BP regimen.  ?

## 2021-06-24 DIAGNOSIS — M5451 Vertebrogenic low back pain: Secondary | ICD-10-CM | POA: Diagnosis not present

## 2021-06-24 DIAGNOSIS — R269 Unspecified abnormalities of gait and mobility: Secondary | ICD-10-CM | POA: Diagnosis not present

## 2021-06-26 DIAGNOSIS — M5451 Vertebrogenic low back pain: Secondary | ICD-10-CM | POA: Diagnosis not present

## 2021-06-26 DIAGNOSIS — R269 Unspecified abnormalities of gait and mobility: Secondary | ICD-10-CM | POA: Diagnosis not present

## 2021-07-01 DIAGNOSIS — M5451 Vertebrogenic low back pain: Secondary | ICD-10-CM | POA: Diagnosis not present

## 2021-07-01 DIAGNOSIS — R269 Unspecified abnormalities of gait and mobility: Secondary | ICD-10-CM | POA: Diagnosis not present

## 2021-07-15 ENCOUNTER — Other Ambulatory Visit: Payer: Self-pay | Admitting: Family Medicine

## 2021-08-13 ENCOUNTER — Encounter: Payer: Self-pay | Admitting: Family Medicine

## 2021-08-13 ENCOUNTER — Ambulatory Visit (INDEPENDENT_AMBULATORY_CARE_PROVIDER_SITE_OTHER): Payer: Medicare Other | Admitting: Family Medicine

## 2021-08-13 VITALS — BP 131/54 | HR 94 | Temp 97.4°F | Ht 59.75 in | Wt 149.5 lb

## 2021-08-13 DIAGNOSIS — N3941 Urge incontinence: Secondary | ICD-10-CM

## 2021-08-13 DIAGNOSIS — N289 Disorder of kidney and ureter, unspecified: Secondary | ICD-10-CM | POA: Diagnosis not present

## 2021-08-13 DIAGNOSIS — I10 Essential (primary) hypertension: Secondary | ICD-10-CM | POA: Diagnosis not present

## 2021-08-13 DIAGNOSIS — E538 Deficiency of other specified B group vitamins: Secondary | ICD-10-CM | POA: Diagnosis not present

## 2021-08-13 LAB — POC URINALSYSI DIPSTICK (AUTOMATED)
Blood, UA: NEGATIVE
Glucose, UA: NEGATIVE
Ketones, UA: NEGATIVE
Nitrite, UA: NEGATIVE
Protein, UA: POSITIVE — AB
Spec Grav, UA: 1.015 (ref 1.010–1.025)
Urobilinogen, UA: 0.2 E.U./dL
pH, UA: 7.5 (ref 5.0–8.0)

## 2021-08-13 LAB — CBC WITH DIFFERENTIAL/PLATELET
Basophils Absolute: 0.1 10*3/uL (ref 0.0–0.1)
Basophils Relative: 1.5 % (ref 0.0–3.0)
Eosinophils Absolute: 0.1 10*3/uL (ref 0.0–0.7)
Eosinophils Relative: 1.9 % (ref 0.0–5.0)
HCT: 38.1 % (ref 36.0–46.0)
Hemoglobin: 12.7 g/dL (ref 12.0–15.0)
Lymphocytes Relative: 30.3 % (ref 12.0–46.0)
Lymphs Abs: 1.5 10*3/uL (ref 0.7–4.0)
MCHC: 33.3 g/dL (ref 30.0–36.0)
MCV: 89.9 fl (ref 78.0–100.0)
Monocytes Absolute: 0.6 10*3/uL (ref 0.1–1.0)
Monocytes Relative: 12.9 % — ABNORMAL HIGH (ref 3.0–12.0)
Neutro Abs: 2.6 10*3/uL (ref 1.4–7.7)
Neutrophils Relative %: 53.4 % (ref 43.0–77.0)
Platelets: 329 10*3/uL (ref 150.0–400.0)
RBC: 4.24 Mil/uL (ref 3.87–5.11)
RDW: 13 % (ref 11.5–15.5)
WBC: 4.9 10*3/uL (ref 4.0–10.5)

## 2021-08-13 LAB — RENAL FUNCTION PANEL
Albumin: 4.5 g/dL (ref 3.5–5.2)
BUN: 20 mg/dL (ref 6–23)
CO2: 27 mEq/L (ref 19–32)
Calcium: 10.1 mg/dL (ref 8.4–10.5)
Chloride: 100 mEq/L (ref 96–112)
Creatinine, Ser: 0.96 mg/dL (ref 0.40–1.20)
GFR: 52.77 mL/min — ABNORMAL LOW (ref >60.00)
Glucose, Bld: 108 mg/dL — ABNORMAL HIGH (ref 70–99)
Phosphorus: 3.6 mg/dL (ref 2.3–4.6)
Potassium: 3.9 mEq/L (ref 3.5–5.1)
Sodium: 137 mEq/L (ref 135–145)

## 2021-08-13 LAB — VITAMIN B12: Vitamin B-12: 1030 pg/mL — ABNORMAL HIGH (ref 211–911)

## 2021-08-13 MED ORDER — OMEPRAZOLE 20 MG PO CPDR: 20.0000 mg | DELAYED_RELEASE_CAPSULE | Freq: Every day | ORAL | 3 refills | Status: DC

## 2021-08-13 NOTE — Assessment & Plan Note (Addendum)
Chronic, overall stable period based on home readings. In known white coat hypertension, will treat based on home readings. will continue current regimen. Update lbas today. Given some orthostatic dizziness symptoms, consider d/c hctz if ongoing.

## 2021-08-13 NOTE — Patient Instructions (Addendum)
Urinalysis today. Labwork to check kidneys today.  Continue current medications. If ongoing or worsening dizziness, stop hydrochlorothiazide and let us know.  Return as needed or in 6 months for medicare wellness visit

## 2021-08-13 NOTE — Progress Notes (Signed)
Patient ID: Priscilla [name], female    DOB: March 15, 1931, 88 y.o.   MRN: [id number]  This visit was conducted in person.  BP (!) 131/54 Comment: at home yesterday  Pulse 94   Temp (!) 97.4 F (36.3 C) (Temporal)   Ht 4' 11.75" (1.518 m)   Wt 149 lb 8 oz (67.8 kg)   SpO2 97%   BMI 29.44 kg/m   BP Readings from Last 3 Encounters:  08/13/21 (!) 131/54  02/12/21 (!) 160/62  01/14/21 (!) 128/58  On retesting 160/60  CC: 6 mo f/u visit  Subjective:   HPI: Priscilla [name] is a 88 y.o. female presenting on 08/13/2021 for Follow-up (Here for 6 mo f/u.)   She did recently complete physical therapy course for balance training.   GERD - continues omeprazole '20mg'$  daily with benefit, occasionally skips a dose.   Worsening urinary incontinence especially at night time (nocturia x3), no significant daytime somnolence. Notes some dysuria, urgency. No abd pain flank pain fevers/chills, nausea or vomiting. No recent UTI. Manages symptoms with azo and cranberry tablets. She does like sweet tea and coffee. She is trying to increase water intake.   Known white coat HTN - Compliant with current antihypertensive regimen of amlodipien '5mg'$  daily (higher dose caused leg swelling), atenolol '25mg'$  bid, hctz 12.'5mg'$  daily, hydralazine '25mg'$  BID PRN BP >160/100 (hasn't taken in a long time).  Does check blood pressures at home: great control at home - over weekend 117/46, 118/50, 125/49, 108/48, 131/54 yesterday. Occasional dizziness, orthostasis. Denies HA, vision changes, CP/tightness, SOB. Notes mild swelling at ankles but not too bothersome, L>R.  Home BP log from 05/2021: 105/45 110/47 129/49 114/41 125/50 125/51      Relevant past medical, surgical, family and social history reviewed and updated as indicated. Interim medical history since our last visit reviewed. Allergies and medications reviewed and updated. Outpatient Medications Prior to Visit  Medication Sig Dispense Refill   acetaminophen  (TYLENOL) 500 MG tablet Take 500-1,000 mg by mouth every 6 (six) hours as needed for moderate pain or headache.     amLODipine (NORVASC) 5 MG tablet Take 1 tablet (5 mg total) by mouth daily. 90 tablet 1   aspirin EC 81 MG tablet Take 81 mg by mouth daily.     atenolol (TENORMIN) 25 MG tablet TAKE 1 TABLET BY MOUTH TWICE A DAY (Patient taking differently: Takes 1 tablet once a day) 180 tablet 1   Biotin 5 MG CAPS Take 5 mg by mouth at bedtime.      Calcium Carb-Cholecalciferol (CALCIUM 600 + D PO) Take 1 tablet by mouth at bedtime.     calcium carbonate (TUMS - DOSED IN MG ELEMENTAL CALCIUM) 500 MG chewable tablet Chew 1 tablet by mouth 2 (two) times daily as needed for indigestion or heartburn.     Cholecalciferol (D3-50) 1.25 MG (50000 UT) capsule Take 1 capsule (50,000 Units total) by mouth once a week. 12 capsule 3   Cyanocobalamin (B-12) 1000 MCG SUBL Place 1 tablet under the tongue daily.     ferrous sulfate 325 (65 FE) MG tablet Take 1 tablet (325 mg total) by mouth every other day.     hydrALAZINE (APRESOLINE) 25 MG tablet TAKE 1 TABLET (25 MG TOTAL) BY MOUTH 2 (TWO) TIMES DAILY AS NEEDED ((BP>160/100)). 45 tablet 3   hydrochlorothiazide (HYDRODIURIL) 12.5 MG tablet Take 1 tablet (12.5 mg total) by mouth daily. 90 tablet 1   nitroGLYCERIN (NITROSTAT) 0.4 MG SL tablet  Place 1 tablet (0.4 mg total) under the tongue every 5 (five) minutes as needed for chest pain. 25 tablet 1   omeprazole (PRILOSEC) 20 MG capsule TAKE 1 CAPSULE BY MOUTH EVERY DAY 90 capsule 2   No facility-administered medications prior to visit.     Per HPI unless specifically indicated in ROS section below Review of Systems  Objective:  BP (!) 131/54 Comment: at home yesterday  Pulse 94   Temp (!) 97.4 F (36.3 C) (Temporal)   Ht 4' 11.75" (1.518 m)   Wt 149 lb 8 oz (67.8 kg)   SpO2 97%   BMI 29.44 kg/m   Wt Readings from Last 3 Encounters:  08/13/21 149 lb 8 oz (67.8 kg)  02/12/21 150 lb 4 oz (68.2 kg)   02/04/21 151 lb (68.5 kg)      Physical Exam Vitals and nursing note reviewed.  Constitutional:      Appearance: Normal appearance. She is not ill-appearing.  Cardiovascular:     Rate and Rhythm: Normal rate and regular rhythm.     Pulses: Normal pulses.     Heart sounds: Normal heart sounds. No murmur heard. Pulmonary:     Effort: Pulmonary effort is normal. No respiratory distress.     Breath sounds: Normal breath sounds. No wheezing, rhonchi or rales.  Abdominal:     General: Bowel sounds are normal. There is no distension.     Palpations: Abdomen is soft. There is no hepatomegaly or mass.     Tenderness: There is abdominal tenderness in the suprapubic area. There is no right CVA tenderness, left CVA tenderness, guarding or rebound. Negative signs include Murphy's sign.     Hernia: No hernia is present.  Musculoskeletal:     Right lower leg: Edema (tr) present.     Left lower leg: Edema (tr) present.  Skin:    General: Skin is warm and dry.     Findings: No rash.  Neurological:     Mental Status: She is alert.  Psychiatric:        Mood and Affect: Mood normal.        Behavior: Behavior normal.      Results for orders placed or performed in visit on 02/12/21  Renal function panel  Result Value Ref Range   Sodium 137 135 - 145 mEq/L   Potassium 4.2 3.5 - 5.1 mEq/L   Chloride 100 96 - 112 mEq/L   CO2 28 19 - 32 mEq/L   Albumin 4.2 3.5 - 5.2 g/dL   BUN 20 6 - 23 mg/dL   Creatinine, Ser 0.84 0.40 - 1.20 mg/dL   Glucose, Bld 100 (H) 70 - 99 mg/dL   Phosphorus 3.2 2.3 - 4.6 mg/dL   GFR 62.16 >60.00 mL/min   Calcium 9.8 8.4 - 10.5 mg/dL  Ferritin  Result Value Ref Range   Ferritin 64.1 10.0 - 291.0 ng/mL  IBC panel  Result Value Ref Range   Iron 120 42 - 145 ug/dL   Transferrin 263.0 212.0 - 360.0 mg/dL   Saturation Ratios 32.6 20.0 - 50.0 %   TIBC 368.2 250.0 - 450.0 mcg/dL  VITAMIN D 25 Hydroxy (Vit-D Deficiency, Fractures)  Result Value Ref Range   VITD 84.29  30.00 - 100.00 ng/mL    Assessment & Plan:   Problem List Items Addressed This Visit     Vitamin B12 deficiency    Update B12 levels on oral replacement.       Relevant Orders  Vitamin B12   White coat syndrome with hypertension - Primary    Chronic, overall stable period based on home readings. In known white coat hypertension, will treat based on home readings. will continue current regimen. Update lbas today. Given some orthostatic dizziness symptoms, consider d/c hctz if ongoing.        Urge urinary incontinence    Notes worsening trouble with this, predominant nocturia, but also occasional dysuria - update UA today to r/o UTI contribution.        Renal insufficiency    Update renal panel.        Relevant Orders   CBC with Differential/Platelet   Renal function panel     Meds ordered this encounter  Medications   omeprazole (PRILOSEC) 20 MG capsule    Sig: Take 1 capsule (20 mg total) by mouth daily.    Dispense:  90 capsule    Refill:  3   Orders Placed This Encounter  Procedures   Vitamin B12   CBC with Differential/Platelet   Renal function panel     Patient Instructions  Urinalysis today. Labwork to check kidneys today.  Continue current medications. If ongoing or worsening dizziness, stop hydrochlorothiazide and let us know.  Return as needed or in 6 months for medicare wellness visit  Follow up plan: Return in about 6 months (around 02/13/2022), or if symptoms worsen or fail to improve, for medicare wellness visit.  Ria Bush, MD

## 2021-08-13 NOTE — Addendum Note (Signed)
Addended by: Ria Bush on: 08/13/2021 09:40 AM   Modules accepted: Orders

## 2021-08-13 NOTE — Assessment & Plan Note (Signed)
Update renal panel 

## 2021-08-13 NOTE — Addendum Note (Signed)
Addended by: Brenton Grills on: 4/58/0998 33:82 AM   Modules accepted: Orders

## 2021-08-13 NOTE — Assessment & Plan Note (Addendum)
Notes worsening trouble with this, predominant nocturia, but also occasional dysuria - update UA today to r/o UTI contribution.

## 2021-08-13 NOTE — Assessment & Plan Note (Signed)
Update B12 levels on oral replacement.

## 2021-08-16 LAB — URINE CULTURE
MICRO NUMBER:: 13433666
SPECIMEN QUALITY:: ADEQUATE

## 2021-08-20 ENCOUNTER — Other Ambulatory Visit: Payer: Self-pay | Admitting: Family Medicine

## 2021-08-20 MED ORDER — B-12 1000 MCG SL SUBL: 1.0000 | SUBLINGUAL_TABLET | SUBLINGUAL | Status: AC

## 2021-08-20 MED ORDER — SULFAMETHOXAZOLE-TRIMETHOPRIM 800-160 MG PO TABS: 1.0000 | ORAL_TABLET | Freq: Two times a day (BID) | ORAL | 0 refills | Status: DC

## 2021-11-27 ENCOUNTER — Ambulatory Visit (INDEPENDENT_AMBULATORY_CARE_PROVIDER_SITE_OTHER): Payer: Medicare Other | Admitting: Family Medicine

## 2021-11-27 VITALS — BP 170/68 | HR 76 | Wt 147.0 lb

## 2021-11-27 DIAGNOSIS — R32 Unspecified urinary incontinence: Secondary | ICD-10-CM | POA: Insufficient documentation

## 2021-11-27 DIAGNOSIS — N898 Other specified noninflammatory disorders of vagina: Secondary | ICD-10-CM | POA: Insufficient documentation

## 2021-11-27 LAB — POCT URINALYSIS DIPSTICK
Bilirubin, UA: 1
Blood, UA: NEGATIVE
Glucose, UA: NEGATIVE
Ketones, UA: NEGATIVE
Nitrite, UA: NEGATIVE
Protein, UA: POSITIVE — AB
Spec Grav, UA: <=1.005 — AB (ref 1.010–1.025)
Urobilinogen, UA: 0.2 E.U./dL
pH, UA: 7 (ref 5.0–8.0)

## 2021-11-27 MED ORDER — FLUCONAZOLE 150 MG PO TABS: 150.0000 mg | ORAL_TABLET | Freq: Once | ORAL | 0 refills | Status: AC

## 2021-11-27 MED ORDER — TRIAMCINOLONE ACETONIDE 0.1 % EX CREA: 1.0000 | TOPICAL_CREAM | Freq: Two times a day (BID) | CUTANEOUS | 0 refills | Status: DC

## 2021-11-27 NOTE — Assessment & Plan Note (Signed)
Acute, likely contact dermatitis versus Candida  Encouraged her to use a barrier cream to keep urine off of sensitive vaginal tissue.  She will also treat temporarily with a topical steroid cream to help with inflammation.  I will treat her with fluconazole to cover for possible but less likely fungal infection.

## 2021-11-27 NOTE — Progress Notes (Signed)
Patient ID: Priscilla [name], female    DOB: May 13, 1930, 88 y.o.   MRN: [id number]  This visit was conducted in person.  BP (!) 170/68   Pulse 76   Wt 147 lb (66.7 kg)   SpO2 98%   BMI 28.95 kg/m    CC:  Chief Complaint  Patient presents with   Vaginal Bleeding    Subjective:   HPI: Priscilla [name] is a 88 y.o. female patient of Dr. Danise Mina with history of incontinence, History of rectal cancer, colon cancer, anemia associated with nutritional deficiency presenting on 11/27/2021 for Vaginal Bleeding  Reviewed office visit note with primary from 08/13/2021 for hypertension.   She reports history of complete incontinence. Mainly at night.. wears Depends. Able to control it during the day.    She is very sore in vaginal area, externally.. ongoing  1 week.  Saw blood, bright red blood on toilet tissue. No blood on the  pad.   No dysuria, no frequency.    She has tried vaginal but it caused severe buring.  Vaseline helps some.    She is not on a blood thinner.    Relevant past medical, surgical, family and social history reviewed and updated as indicated. Interim medical history since our last visit reviewed. Allergies and medications reviewed and updated. Outpatient Medications Prior to Visit  Medication Sig Dispense Refill   acetaminophen (TYLENOL) 500 MG tablet Take 500-1,000 mg by mouth every 6 (six) hours as needed for moderate pain or headache.     amLODipine (NORVASC) 5 MG tablet Take 1 tablet (5 mg total) by mouth daily. 90 tablet 1   aspirin EC 81 MG tablet Take 81 mg by mouth daily.     atenolol (TENORMIN) 25 MG tablet TAKE 1 TABLET BY MOUTH TWICE A DAY (Patient taking differently: Takes 1 tablet once a day) 180 tablet 1   Calcium Carb-Cholecalciferol (CALCIUM 600 + D PO) Take 1 tablet by mouth at bedtime.     calcium carbonate (TUMS - DOSED IN MG ELEMENTAL CALCIUM) 500 MG chewable tablet Chew 1 tablet by mouth 2 (two) times daily as needed for indigestion or  heartburn.     Cholecalciferol (D3-50) 1.25 MG (50000 UT) capsule Take 1 capsule (50,000 Units total) by mouth once a week. 12 capsule 3   Cyanocobalamin (B-12) 1000 MCG SUBL Place 1 tablet under the tongue every Monday, Wednesday, and Friday.     ferrous sulfate 325 (65 FE) MG tablet Take 1 tablet (325 mg total) by mouth every other day.     nitroGLYCERIN (NITROSTAT) 0.4 MG SL tablet Place 1 tablet (0.4 mg total) under the tongue every 5 (five) minutes as needed for chest pain. 25 tablet 1   omeprazole (PRILOSEC) 20 MG capsule Take 1 capsule (20 mg total) by mouth daily. 90 capsule 3   Biotin 5 MG CAPS Take 5 mg by mouth at bedtime.  (Patient not taking: Reported on 11/27/2021)     hydrALAZINE (APRESOLINE) 25 MG tablet TAKE 1 TABLET (25 MG TOTAL) BY MOUTH 2 (TWO) TIMES DAILY AS NEEDED ((BP>160/100)). (Patient not taking: Reported on 11/27/2021) 45 tablet 3   hydrochlorothiazide (HYDRODIURIL) 12.5 MG tablet Take 1 tablet (12.5 mg total) by mouth daily. (Patient not taking: Reported on 11/27/2021) 90 tablet 1   sulfamethoxazole-trimethoprim (BACTRIM DS) 800-160 MG tablet Take 1 tablet by mouth 2 (two) times daily. (Patient not taking: Reported on 11/27/2021) 10 tablet 0   No facility-administered medications prior to visit.  Per HPI unless specifically indicated in ROS section below Review of Systems  Constitutional:  Negative for fatigue and fever.  HENT:  Negative for congestion and ear pain.   Eyes:  Negative for pain.  Respiratory:  Negative for cough, chest tightness and shortness of breath.   Cardiovascular:  Negative for chest pain, palpitations and leg swelling.  Gastrointestinal:  Negative for abdominal pain.  Genitourinary:  Negative for dysuria and vaginal bleeding.  Musculoskeletal:  Negative for back pain.  Neurological:  Negative for syncope, light-headedness and headaches.  Psychiatric/Behavioral:  Negative for dysphoric mood.    Objective:  BP (!) 170/68   Pulse 76   Wt 147 lb  (66.7 kg)   SpO2 98%   BMI 28.95 kg/m   Wt Readings from Last 3 Encounters:  11/27/21 147 lb (66.7 kg)  08/13/21 149 lb 8 oz (67.8 kg)  02/12/21 150 lb 4 oz (68.2 kg)      Physical Exam Exam conducted with a chaperone present.  Constitutional:      General: She is not in acute distress.    Appearance: Normal appearance. She is well-developed. She is not ill-appearing or toxic-appearing.  HENT:     Head: Normocephalic.     Right Ear: Hearing, tympanic membrane, ear canal and external ear normal. Tympanic membrane is not erythematous, retracted or bulging.     Left Ear: Hearing, tympanic membrane, ear canal and external ear normal. Tympanic membrane is not erythematous, retracted or bulging.     Nose: No mucosal edema or rhinorrhea.     Right Sinus: No maxillary sinus tenderness or frontal sinus tenderness.     Left Sinus: No maxillary sinus tenderness or frontal sinus tenderness.     Mouth/Throat:     Pharynx: Uvula midline.  Eyes:     General: Lids are normal. Lids are everted, no foreign bodies appreciated.     Conjunctiva/sclera: Conjunctivae normal.     Pupils: Pupils are equal, round, and reactive to light.  Neck:     Thyroid: No thyroid mass or thyromegaly.     Vascular: No carotid bruit.     Trachea: Trachea normal.  Cardiovascular:     Rate and Rhythm: Normal rate and regular rhythm.     Pulses: Normal pulses.     Heart sounds: Normal heart sounds, S1 normal and S2 normal. No murmur heard.    No friction rub. No gallop.  Pulmonary:     Effort: Pulmonary effort is normal. No tachypnea or respiratory distress.     Breath sounds: Normal breath sounds. No decreased breath sounds, wheezing, rhonchi or rales.  Abdominal:     General: Bowel sounds are normal.     Palpations: Abdomen is soft.     Tenderness: There is no abdominal tenderness.  Genitourinary:    Exam position: Lithotomy position.     Pubic Area: No rash or pubic lice.      Labia:        Right: Rash and  tenderness present.        Left: Rash and tenderness present.      Urethra: No prolapse.     Comments: Small abrasion, like source of bleeding at base of vaginal vault. Musculoskeletal:     Cervical back: Normal range of motion and neck supple.  Skin:    General: Skin is warm and dry.     Findings: No rash.  Neurological:     Mental Status: She is alert.  Psychiatric:  Mood and Affect: Mood is not anxious or depressed.        Speech: Speech normal.        Behavior: Behavior normal. Behavior is cooperative.        Thought Content: Thought content normal.        Judgment: Judgment normal.       Results for orders placed or performed in visit on 08/13/21  Urine Culture   Specimen: Urine  Result Value Ref Range   MICRO NUMBER: [id number]    SPECIMEN QUALITY: Adequate    Sample Source NOT GIVEN    STATUS: FINAL    ISOLATE 1: Escherichia coli (A)       Susceptibility   Escherichia coli - URINE CULTURE, REFLEX    AMOX/CLAVULANIC 4 Sensitive     AMPICILLIN 8 Sensitive     AMPICILLIN/SULBACTAM 4 Sensitive     CEFAZOLIN* <=4 Not Reportable      * For infections other than uncomplicated UTI caused by E. coli, K. pneumoniae or P. mirabilis: Cefazolin is resistant if MIC > or = 8 mcg/mL. (Distinguishing susceptible versus intermediate for isolates with MIC < or = 4 mcg/mL requires additional testing.) For uncomplicated UTI caused by E. coli, K. pneumoniae or P. mirabilis: Cefazolin is susceptible if MIC <32 mcg/mL and predicts susceptible to the oral agents cefaclor, cefdinir, cefpodoxime, cefprozil, cefuroxime, cephalexin and loracarbef.     CEFTAZIDIME <=1 Sensitive     CEFEPIME <=1 Sensitive     CEFTRIAXONE <=1 Sensitive     CIPROFLOXACIN <=0.25 Sensitive     LEVOFLOXACIN <=0.12 Sensitive     GENTAMICIN <=1 Sensitive     IMIPENEM <=0.25 Sensitive     NITROFURANTOIN <=16 Sensitive     PIP/TAZO <=4 Sensitive     TOBRAMYCIN <=1 Sensitive     TRIMETH/SULFA* <=20  Sensitive      * For infections other than uncomplicated UTI caused by E. coli, K. pneumoniae or P. mirabilis: Cefazolin is resistant if MIC > or = 8 mcg/mL. (Distinguishing susceptible versus intermediate for isolates with MIC < or = 4 mcg/mL requires additional testing.) For uncomplicated UTI caused by E. coli, K. pneumoniae or P. mirabilis: Cefazolin is susceptible if MIC <32 mcg/mL and predicts susceptible to the oral agents cefaclor, cefdinir, cefpodoxime, cefprozil, cefuroxime, cephalexin and loracarbef. Legend: S = Susceptible  I = Intermediate R = Resistant  NS = Not susceptible * = Not tested  NR = Not reported **NN = See antimicrobic comments   Vitamin B12  Result Value Ref Range   Vitamin B-12 1,030 (H) 211 - 911 pg/mL  CBC with Differential/Platelet  Result Value Ref Range   WBC 4.9 4.0 - 10.5 K/uL   RBC 4.24 3.87 - 5.11 Mil/uL   Hemoglobin 12.7 12.0 - 15.0 g/dL   HCT 38.1 36.0 - 46.0 %   MCV 89.9 78.0 - 100.0 fl   MCHC 33.3 30.0 - 36.0 g/dL   RDW 13.0 11.5 - 15.5 %   Platelets 329.0 150.0 - 400.0 K/uL   Neutrophils Relative % 53.4 43.0 - 77.0 %   Lymphocytes Relative 30.3 12.0 - 46.0 %   Monocytes Relative 12.9 (H) 3.0 - 12.0 %   Eosinophils Relative 1.9 0.0 - 5.0 %   Basophils Relative 1.5 0.0 - 3.0 %   Neutro Abs 2.6 1.4 - 7.7 K/uL   Lymphs Abs 1.5 0.7 - 4.0 K/uL   Monocytes Absolute 0.6 0.1 - 1.0 K/uL   Eosinophils Absolute 0.1 0.0 - 0.7  K/uL   Basophils Absolute 0.1 0.0 - 0.1 K/uL  Renal function panel  Result Value Ref Range   Sodium 137 135 - 145 mEq/L   Potassium 3.9 3.5 - 5.1 mEq/L   Chloride 100 96 - 112 mEq/L   CO2 27 19 - 32 mEq/L   Albumin 4.5 3.5 - 5.2 g/dL   BUN 20 6 - 23 mg/dL   Creatinine, Ser 0.96 0.40 - 1.20 mg/dL   Glucose, Bld 108 (H) 70 - 99 mg/dL   Phosphorus 3.6 2.3 - 4.6 mg/dL   GFR 52.77 (L) >60.00 mL/min   Calcium 10.1 8.4 - 10.5 mg/dL  POCT Urinalysis Dipstick (Automated)  Result Value Ref Range   Color, UA yellow     Clarity, UA cloudy    Glucose, UA Negative Negative   Bilirubin, UA 1+    Ketones, UA negative    Spec Grav, UA 1.015 1.010 - 1.025   Blood, UA negative    pH, UA 7.5 5.0 - 8.0   Protein, UA Positive (A) Negative   Urobilinogen, UA 0.2 0.2 or 1.0 E.U./dL   Nitrite, UA negative    Leukocytes, UA Large (3+) (A) Negative     COVID 19 screen:  No recent travel or known exposure to COVID19 The patient denies respiratory symptoms of COVID 19 at this time. The importance of social distancing was discussed today.   Assessment and Plan Problem List Items Addressed This Visit     Incontinence in female - Primary    Chronic, No sign of urinary infection  Per urinalysis. No blood in urine.      Relevant Orders   Urinalysis Dipstick (Completed)   Vaginal irritation    Acute, likely contact dermatitis versus Candida  Encouraged her to use a barrier cream to keep urine off of sensitive vaginal tissue.  She will also treat temporarily with a topical steroid cream to help with inflammation.  I will treat her with fluconazole to cover for possible but less likely fungal infection.      Meds ordered this encounter  Medications   triamcinolone cream (KENALOG) 0.1 %    Sig: Apply 1 Application topically 2 (two) times daily.    Dispense:  30 g    Refill:  0   fluconazole (DIFLUCAN) 150 MG tablet    Sig: Take 1 tablet (150 mg total) by mouth once for 1 dose.    Dispense:  1 tablet    Refill:  0   Orders Placed This Encounter  Procedures   Urinalysis Dipstick       Eliezer Lofts, MD

## 2021-11-27 NOTE — Assessment & Plan Note (Signed)
Chronic, No sign of urinary infection  Per urinalysis. No blood in urine.

## 2021-11-27 NOTE — Patient Instructions (Signed)
Treat with oral antifungal medication x 1.  Apply topical steroid twice daily.  Use barrier cream like Vaseline or Desitin to protect  tissue from urine.

## 2021-12-02 ENCOUNTER — Ambulatory Visit: Payer: Medicare Other | Admitting: Physician Assistant

## 2022-01-09 ENCOUNTER — Other Ambulatory Visit: Payer: Self-pay | Admitting: Family Medicine

## 2022-01-27 ENCOUNTER — Telehealth: Payer: Self-pay | Admitting: Family Medicine

## 2022-01-27 NOTE — Telephone Encounter (Signed)
error 

## 2022-02-07 ENCOUNTER — Telehealth: Payer: Self-pay

## 2022-02-07 ENCOUNTER — Ambulatory Visit (INDEPENDENT_AMBULATORY_CARE_PROVIDER_SITE_OTHER): Payer: Medicare Other | Admitting: Family Medicine

## 2022-02-07 ENCOUNTER — Encounter: Payer: Self-pay | Admitting: Family Medicine

## 2022-02-07 VITALS — BP 146/56 | HR 73 | Temp 97.2°F | Ht 59.75 in | Wt 149.0 lb

## 2022-02-07 DIAGNOSIS — J0100 Acute maxillary sinusitis, unspecified: Secondary | ICD-10-CM | POA: Diagnosis not present

## 2022-02-07 MED ORDER — BENZONATATE 200 MG PO CAPS: 200.0000 mg | ORAL_CAPSULE | Freq: Three times a day (TID) | ORAL | 1 refills | Status: DC | PRN

## 2022-02-07 MED ORDER — AMOXICILLIN-POT CLAVULANATE 875-125 MG PO TABS: 1.0000 | ORAL_TABLET | Freq: Two times a day (BID) | ORAL | 0 refills | Status: DC

## 2022-02-07 NOTE — Progress Notes (Unsigned)
Sick exposure about 3 weeks ago.  Started about 2 weeks ago with a cough and sore throat. Negative covid test last week. Cough is on going, episodic.  Discolored rhinorrhea and sputum.  Gargling.  ST and L ear pain.  No fevers.  Can cough to the point of getting SOB.  Took delsym for cough.  Still eating and drinking.    Meds, vitals, and allergies reviewed.   ROS: Per HPI unless specifically indicated in ROS section   Speaking in complete sentences.

## 2022-02-07 NOTE — Telephone Encounter (Signed)
Pt called office for 3 wks having cough and S/T. Today cough and S/T seems worse; pt said earlier she had coughing episode and could not catch her breath for few mins. Pt said now she can breathe OK but does have prod cough with yellow green phlegm; pt has SOB at times when coughing and some wheezing. No fever and no CP. Pt said cked covid test x 2 last week and both were negative. Pt does not want to go to UC and pt scheduled appt with Dr Damita Dunnings 02/07/22 at 3 PM with UC & ED precautions and pt voiced understanding and pts son will bring her to appt at [facility].sending note to Dr Damita Dunnings and Damita Dunnings pool.

## 2022-02-07 NOTE — Telephone Encounter (Signed)
Contacted patient on preferred number listed in notes for scheduled AWV. Patient daughter stated patient unable to complete visit due to same day emergency visit with PCP.

## 2022-02-07 NOTE — Patient Instructions (Signed)
Try tessalon for the cough.   Rest and fluids.  Start augmentin.   Gargle with salt water.  Update [place] as needed.  Take care.  Glad to see you.

## 2022-02-07 NOTE — Telephone Encounter (Signed)
Noted. Thanks.

## 2022-02-09 DIAGNOSIS — J0100 Acute maxillary sinusitis, unspecified: Secondary | ICD-10-CM | POA: Insufficient documentation

## 2022-02-09 DIAGNOSIS — J019 Acute sinusitis, unspecified: Secondary | ICD-10-CM | POA: Insufficient documentation

## 2022-02-09 NOTE — Assessment & Plan Note (Signed)
Okay for outpatient follow-up. Try tessalon for the cough.   Rest and fluids.  Start augmentin.   Gargle with salt water.  Update [place] as needed.

## 2022-02-12 ENCOUNTER — Ambulatory Visit (INDEPENDENT_AMBULATORY_CARE_PROVIDER_SITE_OTHER): Payer: Medicare Other

## 2022-02-12 VITALS — Ht 59.75 in | Wt 149.0 lb

## 2022-02-12 DIAGNOSIS — Z0000 Encounter for general adult medical examination without abnormal findings: Secondary | ICD-10-CM | POA: Diagnosis not present

## 2022-02-12 NOTE — Patient Instructions (Addendum)
Ms. Priscilla [name] , Thank you for taking time to come for your Medicare Wellness Visit. I appreciate your ongoing commitment to your health goals. Please review the following plan we discussed and let me know if I can assist you in the future.   These are the goals we discussed:  Goals       Patient Stated      Starting 10/28/2017, I will continue to take medications as prescribed.       Patient Stated      01/31/2020, I will maintain and continue medications as prescribed.       Patient Stated      Would like to continue drink more water and be a little more cautious about cholesterol.      Stay Well (pt-stated)      I want to continue to care for myself.        This is a list of the screening recommended for you and due dates:  Health Maintenance  Topic Date Due   COVID-19 Vaccine (4 - 2023-24 season) 02/28/2022*   Zoster (Shingles) Vaccine (1 of 2) 05/15/2022*   Flu Shot  06/22/2022*   Colon Cancer Screening  01/26/2023   Medicare Annual Wellness Visit  02/13/2023   Pneumonia Vaccine  Completed   DEXA scan (bone density measurement)  Completed   HPV Vaccine  Aged Out   Mammogram  Discontinued  *Topic was postponed. The date shown is not the original due date.    Advanced directives: In chart  Conditions/risks identified: None  Next appointment: Follow up in one year for your annual wellness visit    Preventive Care 65 Years and Older, Female Preventive care refers to lifestyle choices and visits with your health care provider that can promote health and wellness. What does preventive care include? A yearly physical exam. This is also called an annual well check. Dental exams once or twice a year. Routine eye exams. Ask your health care provider how often you should have your eyes checked. Personal lifestyle choices, including: Daily care of your teeth and gums. Regular physical activity. Eating a healthy diet. Avoiding tobacco and drug use. Limiting alcohol  use. Practicing safe sex. Taking low-dose aspirin every day. Taking vitamin and mineral supplements as recommended by your health care provider. What happens during an annual well check? The services and screenings done by your health care provider during your annual well check will depend on your age, overall health, lifestyle risk factors, and family history of disease. Counseling  Your health care provider may ask you questions about your: Alcohol use. Tobacco use. Drug use. Emotional well-being. Home and relationship well-being. Sexual activity. Eating habits. History of falls. Memory and ability to understand (cognition). Work and work Statistician. Reproductive health. Screening  You may have the following tests or measurements: Height, weight, and BMI. Blood pressure. Lipid and cholesterol levels. These may be checked every 5 years, or more frequently if you are over 8 years old. Skin check. Lung cancer screening. You may have this screening every year starting at age 81 if you have a 30-pack-year history of smoking and currently smoke or have quit within the past 15 years. Fecal occult blood test (FOBT) of the stool. You may have this test every year starting at age 42. Flexible sigmoidoscopy or colonoscopy. You may have a sigmoidoscopy every 5 years or a colonoscopy every 10 years starting at age 50. Hepatitis C blood test. Hepatitis B blood test. Sexually transmitted disease (STD) testing. Diabetes  screening. This is done by checking your blood sugar (glucose) after you have not eaten for a while (fasting). You may have this done every 1-3 years. Bone density scan. This is done to screen for osteoporosis. You may have this done starting at age 25. Mammogram. This may be done every 1-2 years. Talk to your health care provider about how often you should have regular mammograms. Talk with your health care provider about your test results, treatment options, and if necessary,  the need for more tests. Vaccines  Your health care provider may recommend certain vaccines, such as: Influenza vaccine. This is recommended every year. Tetanus, diphtheria, and acellular pertussis (Tdap, Td) vaccine. You may need a Td booster every 10 years. Zoster vaccine. You may need this after age 73. Pneumococcal 13-valent conjugate (PCV13) vaccine. One dose is recommended after age 58. Pneumococcal polysaccharide (PPSV23) vaccine. One dose is recommended after age 23. Talk to your health care provider about which screenings and vaccines you need and how often you need them. This information is not intended to replace advice given to you by your health care provider. Make sure you discuss any questions you have with your health care provider. Document Released: 04/06/2015 Document Revised: 11/28/2015 Document Reviewed: 01/09/2015 Elsevier Interactive Patient Education  2017 [employer] Prevention in the Home Falls can cause injuries. They can happen to people of all ages. There are many things you can do to make your home safe and to help prevent falls. What can I do on the outside of my home? Regularly fix the edges of walkways and driveways and fix any cracks. Remove anything that might make you trip as you walk through a door, such as a raised step or threshold. Trim any bushes or trees on the path to your home. Use bright outdoor lighting. Clear any walking paths of anything that might make someone trip, such as rocks or tools. Regularly check to see if handrails are loose or broken. Make sure that both sides of any steps have handrails. Any raised decks and porches should have guardrails on the edges. Have any leaves, snow, or ice cleared regularly. Use sand or salt on walking paths during winter. Clean up any spills in your garage right away. This includes oil or grease spills. What can I do in the bathroom? Use night lights. Install grab bars by the toilet and in the  tub and shower. Do not use towel bars as grab bars. Use non-skid mats or decals in the tub or shower. If you need to sit down in the shower, use a plastic, non-slip stool. Keep the floor dry. Clean up any water that spills on the floor as soon as it happens. Remove soap buildup in the tub or shower regularly. Attach bath mats securely with double-sided non-slip rug tape. Do not have throw rugs and other things on the floor that can make you trip. What can I do in the bedroom? Use night lights. Make sure that you have a light by your bed that is easy to reach. Do not use any sheets or blankets that are too big for your bed. They should not hang down onto the floor. Have a firm chair that has side arms. You can use this for support while you get dressed. Do not have throw rugs and other things on the floor that can make you trip. What can I do in the kitchen? Clean up any spills right away. Avoid walking on wet floors. Keep  items that you use a lot in easy-to-reach places. If you need to reach something above you, use a strong step stool that has a grab bar. Keep electrical cords out of the way. Do not use floor polish or wax that makes floors slippery. If you must use wax, use non-skid floor wax. Do not have throw rugs and other things on the floor that can make you trip. What can I do with my stairs? Do not leave any items on the stairs. Make sure that there are handrails on both sides of the stairs and use them. Fix handrails that are broken or loose. Make sure that handrails are as long as the stairways. Check any carpeting to make sure that it is firmly attached to the stairs. Fix any carpet that is loose or worn. Avoid having throw rugs at the top or bottom of the stairs. If you do have throw rugs, attach them to the floor with carpet tape. Make sure that you have a light switch at the top of the stairs and the bottom of the stairs. If you do not have them, ask someone to add them for  you. What else can I do to help prevent falls? Wear shoes that: Do not have high heels. Have rubber bottoms. Are comfortable and fit you well. Are closed at the toe. Do not wear sandals. If you use a stepladder: Make sure that it is fully opened. Do not climb a closed stepladder. Make sure that both sides of the stepladder are locked into place. Ask someone to hold it for you, if possible. Clearly mark and make sure that you can see: Any grab bars or handrails. First and last steps. Where the edge of each step is. Use tools that help you move around (mobility aids) if they are needed. These include: Canes. Walkers. Scooters. Crutches. Turn on the lights when you go into a dark area. Replace any light bulbs as soon as they burn out. Set up your furniture so you have a clear path. Avoid moving your furniture around. If any of your floors are uneven, fix them. If there are any pets around you, be aware of where they are. Review your medicines with your doctor. Some medicines can make you feel dizzy. This can increase your chance of falling. Ask your doctor what other things that you can do to help prevent falls. This information is not intended to replace advice given to you by your health care provider. Make sure you discuss any questions you have with your health care provider. Document Released: 01/04/2009 Document Revised: 08/16/2015 Document Reviewed: 04/14/2014 Elsevier Interactive Patient Education  2017 [employer].

## 2022-02-12 NOTE — Progress Notes (Signed)
Subjective:   Priscilla [name] is a 88 y.o. female who presents for Medicare Annual (Subsequent) preventive examination.  Review of Systems    Virtual Visit via Telephone Note  I connected with  Priscilla [name] on 02/12/22 at  8:15 AM EST by telephone and verified that I am speaking with the correct person using two identifiers.  Location: Patient: Home Provider: Office Persons participating in the virtual visit: patient/Nurse Health Advisor   I discussed the limitations, risks, security and privacy concerns of performing an evaluation and management service by telephone and the availability of in person appointments. The patient expressed understanding and agreed to proceed.  Interactive audio and video telecommunications were attempted between this nurse and patient, however failed, due to patient having technical difficulties OR patient did not have access to video capability.  We continued and completed visit with audio only.  Some vital signs may be absent or patient reported.   Criselda Peaches, LPN  Cardiac Risk Factors include: advanced age (>50mn, >>82women);hypertension     Objective:    Today's Vitals   02/12/22 0819  Weight: 149 lb (67.6 kg)  Height: 4' 11.75" (1.518 m)   Body mass index is 29.34 kg/m.     02/12/2022    8:28 AM 02/04/2021   11:21 AM 01/31/2020    9:09 AM 07/28/2019    3:45 PM 07/19/2019    9:18 AM 10/28/2017    8:27 AM 10/31/2014   10:33 PM  Advanced Directives  Does Patient Have a Medical Advance Directive? Yes Yes Yes Yes Yes Yes No  Type of AParamedicof ANorth Hyde ParkLiving will [place]Living will H[place]taLiving will Living will;Healthcare Power of Attorney Living will;Healthcare Power of AAltonLiving will   Does patient want to make changes to medical advance directive? No - Patient declined Yes (MAU/Ambulatory/Procedural Areas - Information given)   No - Patient declined     Copy of HWhyin Chart? Yes - validated most recent copy scanned in chart (See row information) Yes - validated most recent copy scanned in chart (See row information) Yes - validated most recent copy scanned in chart (See row information) No - copy requested  Yes   Would patient like information on creating a medical advance directive?       No - patient declined information    Current Medications (verified) Outpatient Encounter Medications as of 02/12/2022  Medication Sig   acetaminophen (TYLENOL) 500 MG tablet Take 500-1,000 mg by mouth every 6 (six) hours as needed for moderate pain or headache.   amLODipine (NORVASC) 5 MG tablet Take 1 tablet (5 mg total) by mouth daily.   amoxicillin-clavulanate (AUGMENTIN) 875-125 MG tablet Take 1 tablet by mouth 2 (two) times daily.   aspirin EC 81 MG tablet Take 81 mg by mouth daily.   atenolol (TENORMIN) 25 MG tablet TAKE 1 TABLET BY MOUTH TWICE A DAY   benzonatate (TESSALON) 200 MG capsule Take 1 capsule (200 mg total) by mouth 3 (three) times daily as needed.   Calcium Carb-Cholecalciferol (CALCIUM 600 + D PO) Take 1 tablet by mouth at bedtime.   calcium carbonate (TUMS - DOSED IN MG ELEMENTAL CALCIUM) 500 MG chewable tablet Chew 1 tablet by mouth 2 (two) times daily as needed for indigestion or heartburn.   Cholecalciferol (D3-50) 1.25 MG (50000 UT) capsule Take 1 capsule (50,000 Units total) by mouth once a week.  Cyanocobalamin (B-12) 1000 MCG SUBL Place 1 tablet under the tongue every Monday, Wednesday, and Friday.   ferrous sulfate 325 (65 FE) MG tablet Take 1 tablet (325 mg total) by mouth every other day.   hydrALAZINE (APRESOLINE) 25 MG tablet TAKE 1 TABLET (25 MG TOTAL) BY MOUTH 2 (TWO) TIMES DAILY AS NEEDED ((BP>160/100)).   nitroGLYCERIN (NITROSTAT) 0.4 MG SL tablet Place 1 tablet (0.4 mg total) under the tongue every 5 (five) minutes as needed for chest pain.   omeprazole (PRILOSEC) 20 MG  capsule Take 1 capsule (20 mg total) by mouth daily.   triamcinolone cream (KENALOG) 0.1 % Apply 1 Application topically 2 (two) times daily.   [DISCONTINUED] CALCIUM-VITAMIN D PO Take 1 tablet by mouth 2 (two) times daily.   No facility-administered encounter medications on file as of 02/12/2022.    Allergies (verified) Morphine and related, Lisinopril, and Losartan   History: Past Medical History:  Diagnosis Date   Aortic insufficiency    mild on echo 2020   B12 deficiency 03/25/2014   H/o small bowel obstruction with resection as well as colorectal cancer s/p partial colectomy.  Receives B12 injections    Cancer [facility]) 2001, 2008   colon, rectal   Carotid stenosis, asymptomatic, bilateral    1-39% by dopplers 2020   Chest pain, atypical    no ischemia on nuclear stress test 2019   COVID-19 virus infection 01/14/2021   Treated with molnupiravir.    History of chicken pox    History of Clostridium difficile 2012   History of DVT (deep vein thrombosis) 2002   right leg, after chemo for first cancer, treated   Hypertension    IDA (iron deficiency anemia)    Open wound of abdominal wall with complication    Osteoarthritis    Osteopenia 09/2011   DEXA 2013 spine -2.2, femur -1.6, DEXA 12/2015 - hip T score -1.9   SBO (small bowel obstruction) ([place]) 02/05/2011   On 02/05/11 she had enterolysis and small bowel resection    Past Surgical History:  Procedure Laterality Date   [place] RESECTION  02/05/2011   Procedure: SMALL BOWEL RESECTION;  Surgeon: Harl Bowie, MD;  Location: [place];  Service: General;  Laterality: N/A;   broken nose     CARDIOVASCULAR STRESS TEST  11/2015   WNL ([place])   COLECTOMY     COLON SURGERY  2001 and 2008   COLONOSCOPY  12/2012   erosions at colonic anastomosis, o/w normal rpt 5 yrs (Dr. Penelope Coop at [place])   COLONOSCOPY  01/2018   WNL no rpt recommended ([place])   COLOSTOMY     DEXA  09/2011    T-score: spine -2.2, femur -1.6   ESOPHAGOGASTRODUODENOSCOPY  01/31/2011   Procedure: ESOPHAGOGASTRODUODENOSCOPY (EGD);  Surgeon: Missy Sabins, MD;  Location: [facility] ENDOSCOPY;  Service: Endoscopy;  Laterality: N/A;  duodenal bx;s r/o giardia, r/o celiac diease   EYE SURGERY  2018   cataract surgery   EYE SURGERY  2019   laser surgery   FLEXIBLE SIGMOIDOSCOPY  01/31/2011   Procedure: FLEXIBLE SIGMOIDOSCOPY;  Surgeon: Missy Sabins, MD;  Location: [place];  Service: Endoscopy;  Laterality: N/A;   ILEOSTOMY     JOINT REPLACEMENT  2010   knee    LAPAROTOMY  02/05/2011   Procedure: EXPLORATORY LAPAROTOMY;  Surgeon: Harl Bowie, MD;  Location: [place];  Service: General;  Laterality: N/A;  lysis of adhesions.  NUCLEAR STRESS TEST  11/17/08   NORMAL   REPLACEMENT TOTAL KNEE  2010   RIGHT KNEE   REVERSE SHOULDER ARTHROPLASTY Right 07/28/2019   Procedure: REVERSE SHOULDER ARTHROPLASTY;  Surgeon: Justice Britain, MD;  Location: [facility];  Service: Orthopedics;  Laterality: Right;  exparel   WOUND DEBRIDEMENT N/A 05/21/2012   Procedure: Excision non healing ABDOMINAL WOUND;  Surgeon: Harl Bowie, MD;  Location: [place];  Service: General;  Laterality: N/A;   Family History  Problem Relation Age of Onset   Heart disease Mother        valve problems   Heart attack Mother    Congestive Heart Failure Mother    Heart disease Father        due to lupus   Lupus Father    Heart attack Father    Colon cancer Paternal Grandmother    Coronary artery disease Brother        s/p CABG   Coronary artery disease Sister        s/p CABG   Heart attack Brother    Heart attack Sister    Diabetes Neg Hx    Stroke Neg Hx    Hypertension Neg Hx    Social History   Socioeconomic History   Marital status: Widowed    Spouse name: Not on file   Number of children: Not on file   Years of education: Not on file   Highest education level: Not on file  Occupational History   Not on file  Tobacco Use    Smoking status: Never   Smokeless tobacco: Never  Vaping Use   Vaping Use: Never used  Substance and Sexual Activity   Alcohol use: No   Drug use: No   Sexual activity: Not Currently  Other Topics Concern   Not on file  Social History Narrative   Caffeine: decaf   Lives alone, daughter lives nearby to visit.   Occupation: retired, worked at [employer] office   Edu: HS   Activity: tries to get walking, walks up and down steps at home   Diet: good water, fruits/vegetables daily.  1 cup milk/day, cheese.      Cards: Dr. Mare Ferrari   Surgeon: Dr. Rush Farmer   Social Determinants of Health   Financial Resource Strain: Low Risk  (02/12/2022)   Overall Financial Resource Strain (CARDIA)    Difficulty of Paying Living Expenses: Not hard at all  Food Insecurity: No Food Insecurity (02/12/2022)   Hunger Vital Sign    Worried About Running Out of Food in the Last Year: Never true    Ran Out of Food in the Last Year: Never true  Transportation Needs: No Transportation Needs (02/12/2022)   PRAPARE - Hydrologist (Medical): No    Lack of Transportation (Non-Medical): No  Physical Activity: Inactive (02/12/2022)   Exercise Vital Sign    Days of Exercise per Week: 0 days    Minutes of Exercise per Session: 0 min  Stress: No Stress Concern Present (02/12/2022)   [place]    Feeling of Stress : Not at all  Social Connections: Moderately Integrated (02/12/2022)   Social Connection and Isolation Panel [NHANES]    Frequency of Communication with Friends and Family: More than three times a week    Frequency of Social Gatherings with Friends and Family: More than three times a week    Attends Religious Services: More than 4 times  per year    Active Member of Clubs or Organizations: Yes    Attends Archivist Meetings: More than 4 times per year    Marital Status: Widowed    Tobacco  Counseling Counseling given: Not Answered   Clinical Intake:  Pre-visit preparation completed: No  Pain : No/denies pain     BMI - recorded: 29.34 Nutritional Status: BMI 25 -29 Overweight Nutritional Risks: None Diabetes: No  How often do you need to have someone help you when you read instructions, pamphlets, or other written materials from your doctor or pharmacy?: 1 - Never  Diabetic?  No  Interpreter Needed?: No  Information entered by :: Rolene Arbour LPN   Activities of Daily Living    02/12/2022    8:25 AM  In your present state of health, do you have any difficulty performing the following activities:  Hearing? 0  Vision? 0  Difficulty concentrating or making decisions? 0  Walking or climbing stairs? 0  Dressing or bathing? 0  Doing errands, shopping? 0  Preparing Food and eating ? N  Using the Toilet? N  In the past six months, have you accidently leaked urine? Y  Comment Wears pads at night. Followed by PCP  Do you have problems with loss of bowel control? N  Managing your Medications? N  Managing your Finances? N  Housekeeping or managing your Housekeeping? N    Patient Care Team: Ria Bush, MD as PCP - General (Family Medicine) Sueanne Margarita, MD as PCP - Cardiology (Cardiology)  Indicate any recent Medical Services you may have received from other than [facility] providers in the past year (date may be approximate).     Assessment:   This is a routine wellness examination for Priscilla [name].  Hearing/Vision screen Hearing Screening - Comments:: Denies hearing difficulties   Vision Screening - Comments:: Wears reading glasses - up to date with routine eye exams with  Dr Satira Sark  Dietary issues and exercise activities discussed: Exercise limited by: None identified   Goals Addressed               This Visit's Progress     Stay Well (pt-stated)        I want to continue to care for myself.       Depression Screen    02/12/2022     8:24 AM 02/04/2021   11:25 AM 01/31/2020    9:11 AM 11/11/2018    9:36 AM 10/28/2017    8:25 AM 09/29/2016   10:39 AM 09/21/2015   11:41 AM  PHQ 2/9 Scores  PHQ - 2 Score 0 0 0 0 0 0 0  PHQ- 9 Score   0  0      Fall Risk    02/12/2022    8:26 AM 02/04/2021   11:24 AM 01/31/2020    9:10 AM 11/11/2018    9:36 AM 10/28/2017    8:25 AM  Fall Risk   Falls in the past year? 0 1 0 1 Yes  Comment     foot caught on vine in garden causing patient to fall  Number falls in past yr: 0 0 0 1 1  Injury with Fall? 0 0 0 1 No  Risk for fall due to : No Fall Risks Other (Comment) Medication side effect    Risk for fall due to: Comment  missed a step     Follow up Falls prevention discussed Falls prevention discussed Falls evaluation completed;Falls prevention discussed  FALL RISK PREVENTION PERTAINING TO THE HOME:  Any stairs in or around the home? Yes  If so, are there any without handrails? No  Home free of loose throw rugs in walkways, pet beds, electrical cords, etc? Yes  Adequate lighting in your home to reduce risk of falls? Yes   ASSISTIVE DEVICES UTILIZED TO PREVENT FALLS:  Life alert? No  Use of a cane, walker or w/c? Yes  Grab bars in the bathroom? No  Shower chair or bench in shower? Yes  Elevated toilet seat or a handicapped toilet? Yes  TIMED UP AND GO:  Was the test performed? No . Audio Visit   Cognitive Function:    01/31/2020    9:13 AM 10/28/2017    8:26 AM  MMSE - Mini Mental State Exam  Orientation to time 5 5  Orientation to Place 5 5  Registration 3 3  Attention/ Calculation 5 0  Recall 3 3  Language- name 2 objects  0  Language- repeat 1 1  Language- follow 3 step command  3  Language- read & follow direction  0  Write a sentence  0  Copy design  0  Total score  20        02/12/2022    8:28 AM  6CIT Screen  What Year? 0 points  What month? 0 points  What time? 0 points  Count back from 20 0 points  Months in reverse 0 points  Repeat phrase 0  points  Total Score 0 points    Immunizations Immunization History  Administered Date(s) Administered   Fluad Quad(high Dose 65+) 12/23/2019   Influenza Split 12/11/2011, 12/23/2014   Influenza, High Dose Seasonal PF 01/12/2017, 01/27/2018, 02/08/2021   Influenza,inj,Quad PF,6+ Mos 12/25/2015, 11/11/2018   PFIZER(Purple Top)SARS-COV-2 Vaccination 04/20/2019, 05/11/2019, 01/03/2020   Pneumococcal Conjugate-13 09/12/2013   Pneumococcal Polysaccharide-23 03/25/2007   Td 09/11/2011   Td (Adult), 2 Lf Tetanus Toxid, Preservative Free 09/11/2011   Zoster, Live 05/09/2014      Flu Vaccine status: Up to date  Pneumococcal vaccine status: Up to date  Covid-19 vaccine status: Completed vaccines  Qualifies for Shingles Vaccine? Yes   Zostavax completed No   Shingrix Completed?: No.    Education has been provided regarding the importance of this vaccine. Patient has been advised to call insurance company to determine out of pocket expense if they have not yet received this vaccine. Advised may also receive vaccine at local pharmacy or Health Dept. Verbalized acceptance and understanding.  Screening Tests Health Maintenance  Topic Date Due   COVID-19 Vaccine (4 - 2023-24 season) 02/28/2022 (Originally 11/22/2021)   Zoster Vaccines- Shingrix (1 of 2) 05/15/2022 (Originally [birth year]20/1982)   INFLUENZA VACCINE  06/22/2022 (Originally 10/22/2021)   COLONOSCOPY (Pts 45-14yr Insurance coverage will need to be confirmed)  01/26/2023   Medicare Annual Wellness (AWV)  02/13/2023   Pneumonia Vaccine 88 Years old  Completed   DEXA SCAN  Completed   HPV VACCINES  Aged Out   MAMMOGRAM  Discontinued    Health Maintenance  There are no preventive care reminders to display for this patient.   Colorectal cancer screening: No longer required.   Mammogram status: No longer required due to Age.  Bone Density status: Completed 04/12/20. Results reflect: Bone density results: OSTEOPOROSIS. Repeat every    years.  Lung Cancer Screening: (Low Dose CT Chest recommended if Age 86-80years, 30 pack-year currently smoking OR have quit w/in 15years.) does not qualify.     Additional  Screening:  Hepatitis C Screening: does not qualify; Completed   Vision Screening: Recommended annual ophthalmology exams for early detection of glaucoma and other disorders of the eye. Is the patient up to date with their annual eye exam?  Yes  Who is the provider or what is the name of the office in which the patient attends annual eye exams? Dr Satira Sark If pt is not established with a provider, would they like to be referred to a provider to establish care? No .   Dental Screening: Recommended annual dental exams for proper oral hygiene  Community Resource Referral / Chronic Care Management:  CRR required this visit?  No   CCM required this visit?  No      Plan:     I have personally reviewed and noted the following in the patient's chart:   Medical and social history Use of alcohol, tobacco or illicit drugs  Current medications and supplements including opioid prescriptions. Patient is not currently taking opioid prescriptions. Functional ability and status Nutritional status Physical activity Advanced directives List of other physicians Hospitalizations, surgeries, and ER visits in previous 12 months Vitals Screenings to include cognitive, depression, and falls Referrals and appointments  In addition, I have reviewed and discussed with patient certain preventive protocols, quality metrics, and best practice recommendations. A written personalized care plan for preventive services as well as general preventive health recommendations were provided to patient.     Criselda Peaches, LPN   [id number]   Nurse Notes: None

## 2022-02-18 ENCOUNTER — Ambulatory Visit (INDEPENDENT_AMBULATORY_CARE_PROVIDER_SITE_OTHER): Payer: Medicare Other | Admitting: Family Medicine

## 2022-02-18 ENCOUNTER — Encounter: Payer: Self-pay | Admitting: Family Medicine

## 2022-02-18 VITALS — BP 164/62 | HR 74 | Ht 59.0 in | Wt 148.0 lb

## 2022-02-18 DIAGNOSIS — Z9049 Acquired absence of other specified parts of digestive tract: Secondary | ICD-10-CM | POA: Insufficient documentation

## 2022-02-18 DIAGNOSIS — I10 Essential (primary) hypertension: Secondary | ICD-10-CM | POA: Diagnosis not present

## 2022-02-18 DIAGNOSIS — M85852 Other specified disorders of bone density and structure, left thigh: Secondary | ICD-10-CM

## 2022-02-18 DIAGNOSIS — E538 Deficiency of other specified B group vitamins: Secondary | ICD-10-CM | POA: Diagnosis not present

## 2022-02-18 DIAGNOSIS — Z23 Encounter for immunization: Secondary | ICD-10-CM

## 2022-02-18 DIAGNOSIS — E559 Vitamin D deficiency, unspecified: Secondary | ICD-10-CM | POA: Diagnosis not present

## 2022-02-18 DIAGNOSIS — D539 Nutritional anemia, unspecified: Secondary | ICD-10-CM

## 2022-02-18 DIAGNOSIS — Z85038 Personal history of other malignant neoplasm of large intestine: Secondary | ICD-10-CM

## 2022-02-18 DIAGNOSIS — G6289 Other specified polyneuropathies: Secondary | ICD-10-CM

## 2022-02-18 DIAGNOSIS — M159 Polyosteoarthritis, unspecified: Secondary | ICD-10-CM

## 2022-02-18 DIAGNOSIS — Z85048 Personal history of other malignant neoplasm of rectum, rectosigmoid junction, and anus: Secondary | ICD-10-CM

## 2022-02-18 DIAGNOSIS — R32 Unspecified urinary incontinence: Secondary | ICD-10-CM

## 2022-02-18 DIAGNOSIS — M150 Primary generalized (osteo)arthritis: Secondary | ICD-10-CM

## 2022-02-18 LAB — CBC WITH DIFFERENTIAL/PLATELET
Basophils Absolute: 0.1 10*3/uL (ref 0.0–0.1)
Basophils Relative: 1.2 % (ref 0.0–3.0)
Eosinophils Absolute: 0.1 10*3/uL (ref 0.0–0.7)
Eosinophils Relative: 1.9 % (ref 0.0–5.0)
HCT: 36 % (ref 36.0–46.0)
Hemoglobin: 12 g/dL (ref 12.0–15.0)
Lymphocytes Relative: 22.4 % (ref 12.0–46.0)
Lymphs Abs: 1.4 10*3/uL (ref 0.7–4.0)
MCHC: 33.3 g/dL (ref 30.0–36.0)
MCV: 90.7 fl (ref 78.0–100.0)
Monocytes Absolute: 0.6 10*3/uL (ref 0.1–1.0)
Monocytes Relative: 8.9 % (ref 3.0–12.0)
Neutro Abs: 4.2 10*3/uL (ref 1.4–7.7)
Neutrophils Relative %: 65.6 % (ref 43.0–77.0)
Platelets: 311 10*3/uL (ref 150.0–400.0)
RBC: 3.97 Mil/uL (ref 3.87–5.11)
RDW: 13.3 % (ref 11.5–15.5)
WBC: 6.4 10*3/uL (ref 4.0–10.5)

## 2022-02-18 LAB — LIPID PANEL
Cholesterol: 194 mg/dL (ref 0–200)
HDL: 90.1 mg/dL (ref >39.00)
LDL Cholesterol: 79 mg/dL (ref 0–99)
NonHDL: 103.6
Total CHOL/HDL Ratio: 2
Triglycerides: 123 mg/dL (ref 0.0–149.0)
VLDL: 24.6 mg/dL (ref 0.0–40.0)

## 2022-02-18 LAB — COMPREHENSIVE METABOLIC PANEL
ALT: 14 U/L (ref 0–35)
AST: 20 U/L (ref 0–37)
Albumin: 4.5 g/dL (ref 3.5–5.2)
Alkaline Phosphatase: 94 U/L (ref 39–117)
BUN: 24 mg/dL — ABNORMAL HIGH (ref 6–23)
CO2: 26 mEq/L (ref 19–32)
Calcium: 9.5 mg/dL (ref 8.4–10.5)
Chloride: 102 mEq/L (ref 96–112)
Creatinine, Ser: 0.93 mg/dL (ref 0.40–1.20)
GFR: 54.62 mL/min — ABNORMAL LOW (ref >60.00)
Glucose, Bld: 105 mg/dL — ABNORMAL HIGH (ref 70–99)
Potassium: 4.4 mEq/L (ref 3.5–5.1)
Sodium: 137 mEq/L (ref 135–145)
Total Bilirubin: 0.5 mg/dL (ref 0.2–1.2)
Total Protein: 7 g/dL (ref 6.0–8.3)

## 2022-02-18 LAB — IBC PANEL
Iron: 186 ug/dL — ABNORMAL HIGH (ref 42–145)
Saturation Ratios: 49.9 % (ref 20.0–50.0)
TIBC: 372.4 ug/dL (ref 250.0–450.0)
Transferrin: 266 mg/dL (ref 212.0–360.0)

## 2022-02-18 LAB — VITAMIN B12: Vitamin B-12: 541 pg/mL (ref 211–911)

## 2022-02-18 LAB — TSH: TSH: 2.3 u[IU]/mL (ref 0.35–5.50)

## 2022-02-18 LAB — FERRITIN: Ferritin: 47 ng/mL (ref 10.0–291.0)

## 2022-02-18 LAB — VITAMIN D 25 HYDROXY (VIT D DEFICIENCY, FRACTURES): VITD: 92.62 ng/mL (ref 30.00–100.00)

## 2022-02-18 MED ORDER — OMEPRAZOLE 20 MG PO CPDR: 20.0000 mg | DELAYED_RELEASE_CAPSULE | Freq: Every day | ORAL | 3 refills | Status: DC

## 2022-02-18 MED ORDER — D3-50 1.25 MG (50000 UT) PO CAPS: 50000.0000 [IU] | ORAL_CAPSULE | ORAL | 3 refills | Status: DC

## 2022-02-18 MED ORDER — ATENOLOL 25 MG PO TABS: 25.0000 mg | ORAL_TABLET | Freq: Two times a day (BID) | ORAL | 3 refills | Status: DC

## 2022-02-18 MED ORDER — AMLODIPINE BESYLATE 5 MG PO TABS: 5.0000 mg | ORAL_TABLET | Freq: Every day | ORAL | 3 refills | Status: DC

## 2022-02-18 NOTE — Assessment & Plan Note (Signed)
Update levels on MWF oral replacment

## 2022-02-18 NOTE — Assessment & Plan Note (Signed)
Chronic. Describes stress incontinence and overflow incontinence.

## 2022-02-18 NOTE — Assessment & Plan Note (Signed)
Chronic, will treat to home BP readings. Continue atenolol '25mg'$  BID, amlodipine '5mg'$  daily, hydralazine PRN SBP >160 (hasn't recently used). Refilled today.  Orthostatic dizziness improved off HCTZ.

## 2022-02-18 NOTE — Assessment & Plan Note (Signed)
Update levels today.

## 2022-02-18 NOTE — Assessment & Plan Note (Signed)
Discussed calcium in diet and calcium supplementation. Update vit D levels.

## 2022-02-18 NOTE — Assessment & Plan Note (Signed)
Ongoing, chronic predominantly numbness. No neuropathic pain. Update labs.

## 2022-02-18 NOTE — Progress Notes (Signed)
Patient ID: Priscilla [name], female    DOB: 11/02/1930, 88 y.o.   MRN: [id number]  This visit was conducted in person.  BP (!) 164/62 (BP Location: Left Arm, Patient Position: Sitting, Cuff Size: Normal)   Pulse 74   Ht '4\' 11"'$  (1.499 m)   Wt 148 lb (67.1 kg)   SpO2 97%   BMI 29.89 kg/m   BP Readings from Last 3 Encounters:  02/18/22 (!) 164/62  02/07/22 (!) 146/56  11/27/21 (!) 170/68    CC: AMW f/u visit  Subjective:   HPI: Priscilla [name] is a 88 y.o. female presenting on 02/18/2022 for Medicare Wellness   Saw health advisor last week for medicare wellness visit. Note reviewed.   No results found.  [place] Office Visit from 02/18/2022 in [place] at [place]  PHQ-2 Total Score 0          02/18/2022   11:23 AM 02/12/2022    8:26 AM 02/04/2021   11:24 AM 01/31/2020    9:10 AM 11/11/2018    9:36 AM  Fall Risk   Falls in the past year? 0 0 1 0 1  Number falls in past yr:  0 0 0 1  Injury with Fall?  0 0 0 1  Risk for fall due to :  No Fall Risks Other (Comment) Medication side effect   Risk for fall due to: Comment   missed a step    Follow up  Falls prevention discussed Falls prevention discussed Falls evaluation completed;Falls prevention discussed   Completed PT balance training earlier this year.   Planning to see 84 family members for [place].  She is having worsening knee pain - to see ortho on Friday. She's using cane today. S/p R knee replacement - currently L>R knee pain.   Saw Dr Damita Dunnings last week, treated for sinusitis with benzonatate and augmentin course.   Known white coat hypertension. Home readings largely well controlled. She continues amlodipine, atenolol '25mg'$  twice daily and hydralazine '25mg'$  BID PRN. Last visit 07/2021 we stopped hctz due to orthostatic dizziness - which subsequently resolved. Has not recently taken hydralazine.  BP at home this morning 152/51. Normally 120-130/40s.    Preventative: H/o colon and rectal  cancer. H/o bowel resection for SBO 2012 as well as colon cancers.  COLONOSCOPY Date: 12/2012 erosions at colonic anastomosis, o/w normal rpt 5 yrs (Dr. Penelope Coop at [facility]).  COLONOSCOPY 01/2018 - WNL no rpt recommended ([facility]).  No recent blood in stool or bowel changes.  Well woman - aged out of pap smears.  Mammogram - 03/2020 Birads1 @ [place]. Discussed Q30yrmammo.  DEXA 12/2015 - hip T score -1.9. agrees to repeat given recent humeral fracture.  DEXA 03/2020 - L femur neck -1.6  [place]  Td - 08/2011  Peumovax 2009. Prevnar-13 08/2013  zostavax 04/2014 Shingrix - discussed, declines  Advanced directives - copy in chart 08/2015. HCPOA would be son (Clare Gandy and daughter (Lelan Pons, also would be ok with other children helping. Ok with CPR, intubation and limited trial for reversible conditions, does not want prolonged life support if terminal condition.  Seat belt use discussed Sunscreen use discussed. No changing moles on skin. Non smoker Alcohol - none Dentist - yearly - all teeth have crowns Eye exam - yearly  Bowel - no constipation, chronic intermittent diarrhea after GI surgeries Bladder - complete incontinence mainly at night, wears depends and pad. Also uses azo  and cranberry tablets. H/o remote bladder tack surgery. + stress incontinence. Unaware at night time.   Caffeine: decaf   Lives alone, daughter lives nearby to visit. Widow for 16 yrs   Occupation: retired, worked at [employer] office   Edu: HS   Activity: yardwork, walks up and down steps at home   Diet: good water, fruits/vegetables daily. 1 cup milk/day, cheese      Relevant past medical, surgical, family and social history reviewed and updated as indicated. Interim medical history since our last visit reviewed. Allergies and medications reviewed and updated. Outpatient Medications Prior to Visit  Medication Sig Dispense Refill   acetaminophen (TYLENOL) 500 MG tablet Take  500-1,000 mg by mouth every 6 (six) hours as needed for moderate pain or headache.     aspirin EC 81 MG tablet Take 81 mg by mouth daily.     Calcium Carb-Cholecalciferol (CALCIUM 600 + D PO) Take 1 tablet by mouth at bedtime.     calcium carbonate (TUMS - DOSED IN MG ELEMENTAL CALCIUM) 500 MG chewable tablet Chew 1 tablet by mouth 2 (two) times daily as needed for indigestion or heartburn.     Cyanocobalamin (B-12) 1000 MCG SUBL Place 1 tablet under the tongue every Monday, Wednesday, and Friday.     ferrous sulfate 325 (65 FE) MG tablet Take 1 tablet (325 mg total) by mouth every other day.     hydrALAZINE (APRESOLINE) 25 MG tablet TAKE 1 TABLET (25 MG TOTAL) BY MOUTH 2 (TWO) TIMES DAILY AS NEEDED ((BP>160/100)). 45 tablet 3   nitroGLYCERIN (NITROSTAT) 0.4 MG SL tablet Place 1 tablet (0.4 mg total) under the tongue every 5 (five) minutes as needed for chest pain. 25 tablet 1   triamcinolone cream (KENALOG) 0.1 % Apply 1 Application topically 2 (two) times daily. 30 g 0   amLODipine (NORVASC) 5 MG tablet Take 1 tablet (5 mg total) by mouth daily. 90 tablet 1   amoxicillin-clavulanate (AUGMENTIN) 875-125 MG tablet Take 1 tablet by mouth 2 (two) times daily. 20 tablet 0   atenolol (TENORMIN) 25 MG tablet TAKE 1 TABLET BY MOUTH TWICE A DAY 180 tablet 0   benzonatate (TESSALON) 200 MG capsule Take 1 capsule (200 mg total) by mouth 3 (three) times daily as needed. 30 capsule 1   Cholecalciferol (D3-50) 1.25 MG (50000 UT) capsule Take 1 capsule (50,000 Units total) by mouth once a week. 12 capsule 3   omeprazole (PRILOSEC) 20 MG capsule Take 1 capsule (20 mg total) by mouth daily. 90 capsule 3   No facility-administered medications prior to visit.     Per HPI unless specifically indicated in ROS section below Review of Systems  Objective:  BP (!) 164/62 (BP Location: Left Arm, Patient Position: Sitting, Cuff Size: Normal)   Pulse 74   Ht '4\' 11"'$  (1.499 m)   Wt 148 lb (67.1 kg)   SpO2 97%   BMI  29.89 kg/m   Wt Readings from Last 3 Encounters:  02/18/22 148 lb (67.1 kg)  02/12/22 149 lb (67.6 kg)  02/07/22 149 lb (67.6 kg)      Physical Exam Vitals and nursing note reviewed.  Constitutional:      Appearance: Normal appearance. She is not ill-appearing.     Comments: Ambulating with cane  HENT:     Head: Normocephalic and atraumatic.     Right Ear: Tympanic membrane, ear canal and external ear normal. There is no impacted cerumen.     Left Ear: Tympanic membrane,  ear canal and external ear normal. There is no impacted cerumen.     Mouth/Throat:     Mouth: Mucous membranes are moist.     Pharynx: Oropharynx is clear. No oropharyngeal exudate or posterior oropharyngeal erythema.  Eyes:     General:        Right eye: No discharge.        Left eye: No discharge.     Extraocular Movements: Extraocular movements intact.     Conjunctiva/sclera: Conjunctivae normal.     Pupils: Pupils are equal, round, and reactive to light.  Neck:     Thyroid: No thyroid mass or thyromegaly.     Vascular: No carotid bruit.  Cardiovascular:     Rate and Rhythm: Normal rate and regular rhythm.     Pulses: Normal pulses.     Heart sounds: Murmur (2\6 systolic) heard.  Pulmonary:     Effort: Pulmonary effort is normal. No respiratory distress.     Breath sounds: Normal breath sounds. No wheezing, rhonchi or rales.  Abdominal:     General: Bowel sounds are normal. There is no distension.     Palpations: Abdomen is soft. There is no mass.     Tenderness: There is no abdominal tenderness. There is no guarding or rebound.     Hernia: No hernia is present.  Musculoskeletal:     Cervical back: Normal range of motion and neck supple. No rigidity.     Right lower leg: No edema.     Left lower leg: No edema.     Comments: 1+ DP bilaterally  Lymphadenopathy:     Cervical: No cervical adenopathy.  Skin:    General: Skin is warm and dry.     Findings: No rash.  Neurological:     General: No  focal deficit present.     Mental Status: She is alert. Mental status is at baseline.  Psychiatric:        Mood and Affect: Mood normal.        Behavior: Behavior normal.       Results for orders placed or performed in visit on 11/27/21  Urinalysis Dipstick  Result Value Ref Range   Color, UA yellow    Clarity, UA clear    Glucose, UA Negative Negative   Bilirubin, UA 1    Ketones, UA neg    Spec Grav, UA <=1.005 (A) 1.010 - 1.025   Blood, UA neg    pH, UA 7.0 5.0 - 8.0   Protein, UA Positive (A) Negative   Urobilinogen, UA 0.2 0.2 or 1.0 E.U./dL   Nitrite, UA neg    Leukocytes, UA Moderate (2+) (A) Negative   Appearance     Odor     Lab Results  Component Value Date   CREATININE 0.96 08/13/2021   BUN 20 08/13/2021   NA 137 08/13/2021   K 3.9 08/13/2021   CL 100 08/13/2021   CO2 27 08/13/2021    Lab Results  Component Value Date   WBC 4.9 08/13/2021   HGB 12.7 08/13/2021   HCT 38.1 08/13/2021   MCV 89.9 08/13/2021   PLT 329.0 08/13/2021    Lab Results  Component Value Date   IRON 120 02/12/2021   TIBC 368.2 02/12/2021   FERRITIN 64.1 02/12/2021    Lab Results  Component Value Date   VITAMINB12 1,030 (H) 08/13/2021    Lab Results  Component Value Date   TSH 3.19 11/28/2020    Assessment & Plan:  Problem List Items Addressed This Visit       Unprioritized   Osteoarthritis   History of colon cancer   History of rectal cancer   Osteopenia    Discussed calcium in diet and calcium supplementation. Update vit D levels.       Anemia associated with nutritional deficiency    Update CBC with iron panel. She continues oral iron every other day.       Relevant Orders   CBC with Differential/Platelet   TSH   Ferritin   IBC panel   Vitamin D deficiency    Update levels today.       Relevant Orders   VITAMIN D 25 Hydroxy (Vit-D Deficiency, Fractures)   Vitamin B12 deficiency    Update levels on MWF oral replacment      White coat syndrome  with hypertension - Primary    Chronic, will treat to home BP readings. Continue atenolol '25mg'$  BID, amlodipine '5mg'$  daily, hydralazine PRN SBP >160 (hasn't recently used). Refilled today.  Orthostatic dizziness improved off HCTZ.       Relevant Medications   amLODipine (NORVASC) 5 MG tablet   atenolol (TENORMIN) 25 MG tablet   Other Relevant Orders   Lipid panel   Comprehensive metabolic panel   Peripheral neuropathy    Ongoing, chronic predominantly numbness. No neuropathic pain. Update labs.       Relevant Orders   Comprehensive metabolic panel   CBC with Differential/Platelet   Vitamin B12   TSH   Incontinence in female    Chronic. Describes stress incontinence and overflow incontinence.       History of colon resection   Other Visit Diagnoses     Need for influenza vaccination       Relevant Orders   Flu Vaccine QUAD High Dose(Fluad) (Completed)        Meds ordered this encounter  Medications   amLODipine (NORVASC) 5 MG tablet    Sig: Take 1 tablet (5 mg total) by mouth daily.    Dispense:  90 tablet    Refill:  3   atenolol (TENORMIN) 25 MG tablet    Sig: Take 1 tablet (25 mg total) by mouth 2 (two) times daily.    Dispense:  180 tablet    Refill:  3   Cholecalciferol (D3-50) 1.25 MG (50000 UT) capsule    Sig: Take 1 capsule (50,000 Units total) by mouth once a week.    Dispense:  12 capsule    Refill:  3   omeprazole (PRILOSEC) 20 MG capsule    Sig: Take 1 capsule (20 mg total) by mouth daily.    Dispense:  90 capsule    Refill:  3   Orders Placed This Encounter  Procedures   Flu Vaccine QUAD High Dose(Fluad)   Lipid panel   Comprehensive metabolic panel   VITAMIN D 25 Hydroxy (Vit-D Deficiency, Fractures)   CBC with Differential/Platelet   Vitamin B12   TSH   Ferritin   IBC panel    Patient instructions: Flu shot and labs today  Call to schedule mammogram at your convenience after 03/2022: [facility] in [place] [phone] If  interested, check with pharmacy about new 2 shot shingles series (shingrix).  Let me know if home blood pressures are consistently staying >150/90.  Follow up plan: Return in about 1 year (around 02/19/2023), or if symptoms worsen or fail to improve.  Ria Bush, MD

## 2022-02-18 NOTE — Patient Instructions (Addendum)
Flu shot and labs today  Call to schedule mammogram at your convenience after 03/2022: [facility] in [place] [phone] If interested, check with pharmacy about new 2 shot shingles series (shingrix).  Let me know if home blood pressures are consistently staying >150/90.  Health Maintenance After Age 86 After age 6, you are at a higher risk for certain long-term diseases and infections as well as injuries from falls. Falls are a major cause of broken bones and head injuries in people who are older than age 87. Getting regular preventive care can help to keep you healthy and well. Preventive care includes getting regular testing and making lifestyle changes as recommended by your health care provider. Talk with your health care provider about: Which screenings and tests you should have. A screening is a test that checks for a disease when you have no symptoms. A diet and exercise plan that is right for you. What should I know about screenings and tests to prevent falls? Screening and testing are the best ways to find a health problem early. Early diagnosis and treatment give you the best chance of managing medical conditions that are common after age 66. Certain conditions and lifestyle choices may make you more likely to have a fall. Your health care provider may recommend: Regular vision checks. Poor vision and conditions such as cataracts can make you more likely to have a fall. If you wear glasses, make sure to get your prescription updated if your vision changes. Medicine review. Work with your health care provider to regularly review all of the medicines you are taking, including over-the-counter medicines. Ask your health care provider about any side effects that may make you more likely to have a fall. Tell your health care provider if any medicines that you take make you feel dizzy or sleepy. Strength and balance checks. Your health care provider may recommend certain tests to check  your strength and balance while standing, walking, or changing positions. Foot health exam. Foot pain and numbness, as well as not wearing proper footwear, can make you more likely to have a fall. Screenings, including: Osteoporosis screening. Osteoporosis is a condition that causes the bones to get weaker and break more easily. Blood pressure screening. Blood pressure changes and medicines to control blood pressure can make you feel dizzy. Depression screening. You may be more likely to have a fall if you have a fear of falling, feel depressed, or feel unable to do activities that you used to do. Alcohol use screening. Using too much alcohol can affect your balance and may make you more likely to have a fall. Follow these instructions at home: Lifestyle Do not drink alcohol if: Your health care provider tells you not to drink. If you drink alcohol: Limit how much you have to: 0-1 drink a day for women. 0-2 drinks a day for men. Know how much alcohol is in your drink. In the [place] one drink equals one 12 oz bottle of beer (355 mL), one 5 oz glass of wine (148 mL), or one 1 oz glass of hard liquor (44 mL). Do not use any products that contain nicotine or tobacco. These products include cigarettes, chewing tobacco, and vaping devices, such as e-cigarettes. If you need help quitting, ask your health care provider. Activity  Follow a regular exercise program to stay fit. This will help you maintain your balance. Ask your health care provider what types of exercise are appropriate for you. If you need a cane or walker,  use it as recommended by your health care provider. Wear supportive shoes that have nonskid soles. Safety  Remove any tripping hazards, such as rugs, cords, and clutter. Install safety equipment such as grab bars in bathrooms and safety rails on stairs. Keep rooms and walkways well-lit. General instructions Talk with your health care provider about your risks for falling. Tell  your health care provider if: You fall. Be sure to tell your health care provider about all falls, even ones that seem minor. You feel dizzy, tiredness (fatigue), or off-balance. Take over-the-counter and prescription medicines only as told by your health care provider. These include supplements. Eat a healthy diet and maintain a healthy weight. A healthy diet includes low-fat dairy products, low-fat (lean) meats, and fiber from whole grains, beans, and lots of fruits and vegetables. Stay current with your vaccines. Schedule regular health, dental, and eye exams. Summary Having a healthy lifestyle and getting preventive care can help to protect your health and wellness after age 31. Screening and testing are the best way to find a health problem early and help you avoid having a fall. Early diagnosis and treatment give you the best chance for managing medical conditions that are more common for people who are older than age 31. Falls are a major cause of broken bones and head injuries in people who are older than age 50. Take precautions to prevent a fall at home. Work with your health care provider to learn what changes you can make to improve your health and wellness and to prevent falls. This information is not intended to replace advice given to you by your health care provider. Make sure you discuss any questions you have with your health care provider. Document Revised: 07/30/2020 Document Reviewed: 07/30/2020 Elsevier Patient Education  [place].

## 2022-02-18 NOTE — Assessment & Plan Note (Signed)
Update CBC with iron panel. She continues oral iron every other day.

## 2022-02-21 DIAGNOSIS — M25562 Pain in left knee: Secondary | ICD-10-CM | POA: Diagnosis not present

## 2022-02-21 DIAGNOSIS — M1712 Unilateral primary osteoarthritis, left knee: Secondary | ICD-10-CM | POA: Diagnosis not present

## 2022-02-21 DIAGNOSIS — Z96651 Presence of right artificial knee joint: Secondary | ICD-10-CM | POA: Diagnosis not present

## 2022-02-21 DIAGNOSIS — M25561 Pain in right knee: Secondary | ICD-10-CM | POA: Diagnosis not present

## 2022-02-24 ENCOUNTER — Other Ambulatory Visit: Payer: Self-pay | Admitting: Family Medicine

## 2022-02-25 DIAGNOSIS — H04123 Dry eye syndrome of bilateral lacrimal glands: Secondary | ICD-10-CM | POA: Diagnosis not present

## 2022-02-25 DIAGNOSIS — H02403 Unspecified ptosis of bilateral eyelids: Secondary | ICD-10-CM | POA: Diagnosis not present

## 2022-02-25 DIAGNOSIS — H43813 Vitreous degeneration, bilateral: Secondary | ICD-10-CM | POA: Diagnosis not present

## 2022-02-25 DIAGNOSIS — H5213 Myopia, bilateral: Secondary | ICD-10-CM | POA: Diagnosis not present

## 2022-02-25 DIAGNOSIS — H524 Presbyopia: Secondary | ICD-10-CM | POA: Diagnosis not present

## 2022-04-03 ENCOUNTER — Ambulatory Visit: Payer: Medicare Other | Attending: Physician Assistant | Admitting: Cardiology

## 2022-04-03 ENCOUNTER — Encounter: Payer: Self-pay | Admitting: Cardiology

## 2022-04-03 VITALS — BP 164/60 | HR 68 | Ht 59.5 in | Wt 146.0 lb

## 2022-04-03 DIAGNOSIS — I10 Essential (primary) hypertension: Secondary | ICD-10-CM | POA: Insufficient documentation

## 2022-04-03 DIAGNOSIS — R079 Chest pain, unspecified: Secondary | ICD-10-CM | POA: Diagnosis not present

## 2022-04-03 DIAGNOSIS — I351 Nonrheumatic aortic (valve) insufficiency: Secondary | ICD-10-CM

## 2022-04-03 DIAGNOSIS — I6523 Occlusion and stenosis of bilateral carotid arteries: Secondary | ICD-10-CM | POA: Insufficient documentation

## 2022-04-03 DIAGNOSIS — I340 Nonrheumatic mitral (valve) insufficiency: Secondary | ICD-10-CM | POA: Insufficient documentation

## 2022-04-03 NOTE — Progress Notes (Signed)
Date:  04/03/2022   ID:  Priscilla [name], DOB 1930-09-30, MRN [id number]  PCP:  Ria Bush, MD  Cardiologist:  Fransico Him, MD Electrophysiologist:  None   Chief Complaint:  Chest pain, HTN and aortic insufficiency  History of Present Illness:    Priscilla [name] is a 89 y.o. female with a hx of HTN, mild aortic insufficiency and mild mitral regurgitation.  Nuclear stress test 07/2017 and 11/2020 for SOB showed no ischemia and 2D echo 2019/2022 showed normal LVF with mild AI and mild MR.    She is here today for followup and is doing well.  She denies any further chest pain or pressure or SOB.  She denies any DOE, PND, orthopnea, LE edema, dizziness, palpitations or syncope. She is compliant with her meds and is tolerating meds with no SE.    Prior CV studies:   The following studies were reviewed today: Nuclear stress test and 2D echo as well as carotid Dopplers  Past Medical History:  Diagnosis Date   Aortic insufficiency    mild on echo 2020   B12 deficiency 03/25/2014   H/o small bowel obstruction with resection as well as colorectal cancer s/p partial colectomy.  Receives B12 injections    Cancer [facility]) 2001, 2008   colon, rectal   Carotid stenosis, asymptomatic, bilateral    1-39% by dopplers 2020   Chest pain, atypical    no ischemia on nuclear stress test 2019   COVID-19 virus infection 01/14/2021   Treated with molnupiravir.    History of chicken pox    History of Clostridium difficile 2012   History of DVT (deep vein thrombosis) 2002   right leg, after chemo for first cancer, treated   Hypertension    IDA (iron deficiency anemia)    Open wound of abdominal wall with complication    Osteoarthritis    Osteopenia 09/2011   DEXA 2013 spine -2.2, femur -1.6, DEXA 12/2015 - hip T score -1.9   SBO (small bowel obstruction) ([place]) 02/05/2011   On 02/05/11 she had enterolysis and small bowel resection    Past Surgical History:  Procedure Laterality Date    [place] RESECTION  02/05/2011   Procedure: SMALL BOWEL RESECTION;  Surgeon: Harl Bowie, MD;  Location: [place];  Service: General;  Laterality: N/A;   broken nose     CARDIOVASCULAR STRESS TEST  11/2015   WNL ([facility])   COLECTOMY     COLON SURGERY  2001 and 2008   COLONOSCOPY  12/2012   erosions at colonic anastomosis, o/w normal rpt 5 yrs (Dr. Penelope Coop at [place])   COLONOSCOPY  01/2018   WNL no rpt recommended ([place])   COLOSTOMY     DEXA  09/2011   T-score: spine -2.2, femur -1.6   ESOPHAGOGASTRODUODENOSCOPY  01/31/2011   Procedure: ESOPHAGOGASTRODUODENOSCOPY (EGD);  Surgeon: Missy Sabins, MD;  Location: [facility];  Service: Endoscopy;  Laterality: N/A;  duodenal bx;s r/o giardia, r/o celiac diease   EYE SURGERY  2018   cataract surgery   EYE SURGERY  2019   laser surgery   FLEXIBLE SIGMOIDOSCOPY  01/31/2011   Procedure: FLEXIBLE SIGMOIDOSCOPY;  Surgeon: Missy Sabins, MD;  Location: [place];  Service: Endoscopy;  Laterality: N/A;   ILEOSTOMY     JOINT REPLACEMENT  2010   knee    LAPAROTOMY  02/05/2011   Procedure: EXPLORATORY LAPAROTOMY;  Surgeon: Harl Bowie, MD;  Location: [facility] OR;  Service: General;  Laterality: N/A;  lysis of adhesions.   NUCLEAR STRESS TEST  11/17/08   NORMAL   REPLACEMENT TOTAL KNEE  2010   RIGHT KNEE   REVERSE SHOULDER ARTHROPLASTY Right 07/28/2019   Procedure: REVERSE SHOULDER ARTHROPLASTY;  Surgeon: Justice Britain, MD;  Location: [facility];  Service: Orthopedics;  Laterality: Right;  exparel   WOUND DEBRIDEMENT N/A 05/21/2012   Procedure: Excision non healing ABDOMINAL WOUND;  Surgeon: Harl Bowie, MD;  Location: [place];  Service: General;  Laterality: N/A;     Current Meds  Medication Sig   acetaminophen (TYLENOL) 500 MG tablet Take 500-1,000 mg by mouth every 6 (six) hours as needed for moderate pain or headache.   amLODipine (NORVASC) 5 MG tablet Take 1 tablet (5 mg total) by mouth  daily.   aspirin EC 81 MG tablet Take 81 mg by mouth daily.   atenolol (TENORMIN) 25 MG tablet Take 1 tablet (25 mg total) by mouth 2 (two) times daily.   calcium carbonate (TUMS - DOSED IN MG ELEMENTAL CALCIUM) 500 MG chewable tablet Chew 1 tablet by mouth 2 (two) times daily as needed for indigestion or heartburn.   Cholecalciferol (D3-50) 1.25 MG (50000 UT) capsule Take 1 capsule (50,000 Units total) by mouth every 14 (fourteen) days.   Cyanocobalamin (B-12) 1000 MCG SUBL Place 1 tablet under the tongue every Monday, Wednesday, and Friday.   ferrous sulfate 325 (65 FE) MG tablet Take 1 tablet (325 mg total) by mouth every other day.   hydrALAZINE (APRESOLINE) 25 MG tablet TAKE 1 TABLET (25 MG TOTAL) BY MOUTH 2 (TWO) TIMES DAILY AS NEEDED ((BP>160/100)).   nitroGLYCERIN (NITROSTAT) 0.4 MG SL tablet Place 1 tablet (0.4 mg total) under the tongue every 5 (five) minutes as needed for chest pain.   omeprazole (PRILOSEC) 20 MG capsule Take 1 capsule (20 mg total) by mouth daily.   triamcinolone cream (KENALOG) 0.1 % Apply 1 Application topically 2 (two) times daily.   [DISCONTINUED] Calcium Carb-Cholecalciferol (CALCIUM 600 + D PO) Take 1 tablet by mouth at bedtime.     Allergies:   Morphine and related, Lisinopril, and Losartan   Social History   Tobacco Use   Smoking status: Never   Smokeless tobacco: Never  Vaping Use   Vaping Use: Never used  Substance Use Topics   Alcohol use: No   Drug use: No     Family Hx: The patient's family history includes Colon cancer in her paternal grandmother; Congestive Heart Failure in her mother; Coronary artery disease in her brother and sister; Heart attack in her brother, father, mother, and sister; Heart disease in her father and mother; Lupus in her father. There is no history of Diabetes, Stroke, or Hypertension.  ROS:   Please see the history of present illness.    All other systems reviewed and are negative.   Labs/Other Tests and Data  Reviewed:    Recent Labs: 02/18/2022: ALT 14; BUN 24; Creatinine, Ser 0.93; Hemoglobin 12.0; Platelets 311.0; Potassium 4.4; Sodium 137; TSH 2.30   Recent Lipid Panel Lab Results  Component Value Date/Time   CHOL 194 02/18/2022 12:03 PM   TRIG 123.0 02/18/2022 12:03 PM   HDL 90.10 02/18/2022 12:03 PM   CHOLHDL 2 02/18/2022 12:03 PM   LDLCALC 79 02/18/2022 12:03 PM   LDLDIRECT 101.7 12/12/2010 08:46 AM    Wt Readings from Last 3 Encounters:  04/03/22 146 lb (66.2 kg)  02/18/22 148 lb (67.1 kg)  02/12/22 149 lb (67.6 kg)     Objective:    Vital Signs:  BP (!) 164/60   Pulse 68   Ht 4' 11.5" (1.511 m)   Wt 146 lb (66.2 kg)   SpO2 96%   BMI 28.99 kg/m   GEN: Well nourished, well developed in no acute distress HEENT: Normal NECK: No JVD; No carotid bruits LYMPHATICS: No lymphadenopathy CARDIAC:RRR, no murmurs, rubs, gallops RESPIRATORY:  Clear to auscultation without rales, wheezing or rhonchi  ABDOMEN: Soft, non-tender, non-distended MUSCULOSKELETAL:  No edema; No deformity  SKIN: Warm and dry NEUROLOGIC:  Alert and oriented x 3 PSYCHIATRIC:  Normal affect   EKG was performed in the office today and showed NSR with no ST changes and first degree AVB  ASSESSMENT & PLAN:    Hx of Chest pain/DOE -Nuclear stress test 12/20/2020 showed no ischemia -She has not had any further chest pain or shortness of breath -2D echo 11/2020 showed normal LV function with grade 1 diastolic dysfunction and mild MR and AR -continue ASA, BB  Hypertension  -BP is adequate controlled on exam today>>she has documented white coat HTN>>at home BP runs 130/66mHg -Continue prescription drug management with amlodipine 5 mg daily, atenolol 25 mg twice daily, hydralazine 25 mg twice daily as needed for blood pressure greater than 160/100 mmHg with as needed refills  Aortic insufficiency  -mild by echo 2022  Mitral regurgitation  -Mild by echo 2022  Bilateral carotid artery stenosis -mild  1-39% by dopplers 10/2018 -Repeat carotid Dopplers -Continue aspirin 81 mg daily -She is not on statin due to advanced age   Medication Adjustments/Labs and Tests Ordered: Current medicines are reviewed at length with the patient today.  Concerns regarding medicines are outlined above.  Tests Ordered: Orders Placed This Encounter  Procedures   EKG 12-Lead    Medication Changes: No orders of the defined types were placed in this encounter.    Disposition:  Follow up in 1 year(s)  Signed, TFransico Him MD  04/03/2022 9:36 AM    [facility] hey as ago when you

## 2022-04-03 NOTE — Addendum Note (Signed)
Addended by: Joni Reining on: 04/03/2022 10:11 AM   Modules accepted: Orders

## 2022-04-03 NOTE — Patient Instructions (Signed)
Medication Instructions:  Your physician recommends that you continue on your current medications as directed. Please refer to the Current Medication list given to you today.   *If you need a refill on your cardiac medications before your next appointment, please call your pharmacy*   Lab Work: None. If you have labs (blood work) drawn today and your tests are completely normal, you will receive your results only by: [place] (if you have MyChart) OR A paper copy in the mail If you have any lab test that is abnormal or we need to change your treatment, we will call you to review the results.   Testing/Procedures: Your physician has requested that you have a carotid duplex. This test is an ultrasound of the carotid arteries in your neck. It looks at blood flow through these arteries that supply the brain with blood. Allow one hour for this exam. There are no restrictions or special instructions.   Follow-Up: At [facility], you and your health needs are our priority.  As part of our continuing mission to provide you with exceptional heart care, we have created designated Provider Care Teams.  These Care Teams include your primary Cardiologist (physician) and Advanced Practice Providers (APPs -  Physician Assistants and Nurse Practitioners) who all work together to provide you with the care you need, when you need it.  We recommend signing up for the patient portal called "MyChart".  Sign up information is provided on this After Visit Summary.  MyChart is used to connect with patients for Virtual Visits (Telemedicine).  Patients are able to view lab/test results, encounter notes, upcoming appointments, etc.  Non-urgent messages can be sent to your provider as well.   To learn more about what you can do with MyChart, go to [email or web address].    Your next appointment:   1 year(s)  Provider:   Fransico Him, MD

## 2022-04-15 ENCOUNTER — Ambulatory Visit (HOSPITAL_COMMUNITY)
Admission: RE | Admit: 2022-04-15 | Discharge: 2022-04-15 | Disposition: A | Discharge status: Home / Self Care | Payer: Medicare Other | Source: Ambulatory Visit | Attending: Cardiology | Admitting: Cardiology

## 2022-04-15 DIAGNOSIS — I6523 Occlusion and stenosis of bilateral carotid arteries: Secondary | ICD-10-CM | POA: Insufficient documentation

## 2022-04-16 ENCOUNTER — Telehealth: Payer: Self-pay

## 2022-04-16 ENCOUNTER — Encounter: Payer: Self-pay | Admitting: Cardiology

## 2022-04-16 NOTE — Telephone Encounter (Signed)
Results reviewed with patient who verbalized understanding.  

## 2022-04-16 NOTE — Telephone Encounter (Signed)
-----  Message from Bernestine Amass, RN sent at 04/16/2022  4:00 PM EST -----  ----- Message ----- From: Sueanne Margarita, MD Sent: 04/16/2022   2:38 PM EST To: Ria Bush, MD; [address] Triage  1-39% right carotid stenosis.

## 2022-04-17 ENCOUNTER — Telehealth: Payer: Self-pay | Admitting: Family Medicine

## 2022-04-17 NOTE — Telephone Encounter (Signed)
Patient called in and was wanting to know if she should still be taking Calcium DPO. Please advise. Thank you!

## 2022-04-17 NOTE — Telephone Encounter (Signed)
I assume she means taking a calcium supplement. I'm not sure what DPO is.  She has mild osteopenia. Recommend '1200mg'$  calcium daily, ideally most through the diet. Ok to take 1 calcium supplement on days she doesn't reach this.   To figure out dietary calcium: 300 mg/day from all non dairy foods plus 300 mg per cup of milk, other dairy, or fortified juice.  Non dairy foods that contain calcium: Kale, oranges, sardines, oatmeal, soy milk/soybeans, salmon, white beans, dried figs, turnip greens, almonds, broccoli, tofu.

## 2022-04-18 NOTE — Telephone Encounter (Signed)
Spoke with pt relaying Dr. G's message. Pt verbalizes understanding.  

## 2022-06-24 DIAGNOSIS — X32XXXD Exposure to sunlight, subsequent encounter: Secondary | ICD-10-CM | POA: Diagnosis not present

## 2022-06-24 DIAGNOSIS — L570 Actinic keratosis: Secondary | ICD-10-CM | POA: Diagnosis not present

## 2022-08-07 DIAGNOSIS — Z96651 Presence of right artificial knee joint: Secondary | ICD-10-CM | POA: Diagnosis not present

## 2022-08-07 DIAGNOSIS — M1712 Unilateral primary osteoarthritis, left knee: Secondary | ICD-10-CM | POA: Diagnosis not present

## 2022-09-06 DIAGNOSIS — K219 Gastro-esophageal reflux disease without esophagitis: Secondary | ICD-10-CM | POA: Diagnosis not present

## 2022-09-06 DIAGNOSIS — H9201 Otalgia, right ear: Secondary | ICD-10-CM | POA: Diagnosis not present

## 2022-09-06 DIAGNOSIS — I10 Essential (primary) hypertension: Secondary | ICD-10-CM | POA: Diagnosis not present

## 2022-11-17 ENCOUNTER — Other Ambulatory Visit (HOSPITAL_COMMUNITY): Payer: Self-pay | Admitting: Student

## 2022-11-17 DIAGNOSIS — Z96651 Presence of right artificial knee joint: Secondary | ICD-10-CM | POA: Diagnosis not present

## 2022-11-17 DIAGNOSIS — M25561 Pain in right knee: Secondary | ICD-10-CM | POA: Diagnosis not present

## 2022-11-25 ENCOUNTER — Encounter (HOSPITAL_COMMUNITY)
Admission: RE | Admit: 2022-11-25 | Discharge: 2022-11-25 | Disposition: A | Discharge status: Home / Self Care | Payer: Medicare Other | Source: Ambulatory Visit | Attending: Student | Admitting: Student

## 2022-11-25 DIAGNOSIS — Z471 Aftercare following joint replacement surgery: Secondary | ICD-10-CM | POA: Diagnosis not present

## 2022-11-25 DIAGNOSIS — Z96651 Presence of right artificial knee joint: Secondary | ICD-10-CM | POA: Diagnosis not present

## 2022-11-25 DIAGNOSIS — Z96641 Presence of right artificial hip joint: Secondary | ICD-10-CM | POA: Diagnosis not present

## 2022-11-25 DIAGNOSIS — M25561 Pain in right knee: Secondary | ICD-10-CM | POA: Diagnosis not present

## 2022-11-25 MED ORDER — TECHNETIUM TC 99M MEDRONATE IV KIT
20.0000 | PACK | Freq: Once | INTRAVENOUS | Status: AC | PRN
  Administered 2022-11-25: 20 via INTRAVENOUS

## 2022-12-15 DIAGNOSIS — T8484XA Pain due to internal orthopedic prosthetic devices, implants and grafts, initial encounter: Secondary | ICD-10-CM | POA: Diagnosis not present

## 2022-12-15 DIAGNOSIS — Z96651 Presence of right artificial knee joint: Secondary | ICD-10-CM | POA: Diagnosis not present

## 2022-12-15 DIAGNOSIS — M1712 Unilateral primary osteoarthritis, left knee: Secondary | ICD-10-CM | POA: Diagnosis not present

## 2022-12-15 DIAGNOSIS — M25562 Pain in left knee: Secondary | ICD-10-CM | POA: Diagnosis not present

## 2023-01-02 DIAGNOSIS — Z23 Encounter for immunization: Secondary | ICD-10-CM | POA: Diagnosis not present

## 2023-01-08 ENCOUNTER — Ambulatory Visit: Payer: Medicare Other

## 2023-01-08 ENCOUNTER — Ambulatory Visit (INDEPENDENT_AMBULATORY_CARE_PROVIDER_SITE_OTHER): Payer: Medicare Other | Admitting: Podiatry

## 2023-01-08 ENCOUNTER — Encounter: Payer: Self-pay | Admitting: Podiatry

## 2023-01-08 DIAGNOSIS — M7752 Other enthesopathy of left foot: Secondary | ICD-10-CM | POA: Diagnosis not present

## 2023-01-08 DIAGNOSIS — M2042 Other hammer toe(s) (acquired), left foot: Secondary | ICD-10-CM

## 2023-01-08 MED ORDER — TRIAMCINOLONE ACETONIDE 10 MG/ML IJ SUSP
10.0000 mg | Freq: Once | INTRAMUSCULAR | Status: AC
Start: 2023-01-08 — End: 2023-01-08
  Administered 2023-01-08: 10 mg via INTRA_ARTICULAR

## 2023-01-12 NOTE — Progress Notes (Signed)
Subjective:   Patient ID: Priscilla [name], female   DOB: 89 y.o.   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

## 2023-02-10 ENCOUNTER — Ambulatory Visit: Payer: Medicare Other | Admitting: Family Medicine

## 2023-02-17 ENCOUNTER — Ambulatory Visit (INDEPENDENT_AMBULATORY_CARE_PROVIDER_SITE_OTHER): Payer: Medicare Other

## 2023-02-17 VITALS — Ht 59.5 in | Wt 146.0 lb

## 2023-02-17 DIAGNOSIS — Z0000 Encounter for general adult medical examination without abnormal findings: Secondary | ICD-10-CM

## 2023-02-17 NOTE — Patient Instructions (Signed)
Ms. Gehman , Thank you for taking time to come for your Medicare Wellness Visit. I appreciate your ongoing commitment to your health goals. Please review the following plan we discussed and let me know if I can assist you in the future.   Referrals/Orders/Follow-Ups/Clinician Recommendations: none  This is a list of the screening recommended for you and due dates:  Health Maintenance  Topic Date Due   Zoster (Shingles) Vaccine (1 of 2) 12/20/1982   DTaP/Tdap/Td vaccine (2 - Tdap) 09/10/2021   COVID-19 Vaccine (4 - 2023-24 season) 11/23/2022   Colon Cancer Screening  01/26/2023   Medicare Annual Wellness Visit  02/17/2024   Pneumonia Vaccine  Completed   Flu Shot  Completed   DEXA scan (bone density measurement)  Completed   HPV Vaccine  Aged Out   Mammogram  Discontinued    Advanced directives: (In Chart) A copy of your advanced directives are scanned into your chart should your provider ever need it.  Next Medicare Annual Wellness Visit scheduled for next year: Yes 02/19/2024 @ 8:10am telephone

## 2023-02-17 NOTE — Progress Notes (Signed)
Subjective:   Priscilla [name] is a 89 y.o. female who presents for Medicare Annual (Subsequent) preventive examination.  Visit Complete: Virtual I connected with  Priscilla [name] on 02/17/23 by a audio enabled telemedicine application and verified that I am speaking with the correct person using two identifiers.  Patient Location: Home  Provider Location: Home Office  I discussed the limitations of evaluation and management by telemedicine. The patient expressed understanding and agreed to proceed.  Vital Signs: Because this visit was a virtual/telehealth visit, some criteria may be missing or patient reported. Any vitals not documented were not able to be obtained and vitals that have been documented are patient reported.  Patient Medicare AWV questionnaire was completed by the patient on (not done); I have confirmed that all information answered by patient is correct and no changes since this date. Cardiac Risk Factors include: advanced age (>79men, >86 women)     Objective:    Today's Vitals   02/17/23 1256 02/17/23 1257  Weight: 146 lb (66.2 kg)   Height: 4' 11.5" (1.511 m)   PainSc:  5    Body mass index is 28.99 kg/m.     02/17/2023    1:05 PM 02/12/2022    8:28 AM 02/04/2021   11:21 AM 01/31/2020    9:09 AM 07/28/2019    3:45 PM 07/19/2019    9:18 AM 10/28/2017    8:27 AM  Advanced Directives  Does Patient Have a Medical Advance Directive? Yes Yes Yes Yes Yes Yes Yes  Type of Estate agent of [place];Living will Healthcare Power of [place];Living will Healthcare Power of [place];Living will Healthcare Power of [place];Living will Living will;Healthcare Power of Attorney Living will;Healthcare Power of [employer] Power of [place];Living will  Does patient want to make changes to medical advance directive?  No - Patient declined Yes (MAU/Ambulatory/Procedural Areas - Information given)  No - Patient declined    Copy of Healthcare Power  of Attorney in Chart? Yes - validated most recent copy scanned in chart (See row information) Yes - validated most recent copy scanned in chart (See row information) Yes - validated most recent copy scanned in chart (See row information) Yes - validated most recent copy scanned in chart (See row information) No - copy requested  Yes    Current Medications (verified) Outpatient Encounter Medications as of 02/17/2023  Medication Sig   acetaminophen (TYLENOL) 500 MG tablet Take 500-1,000 mg by mouth every 6 (six) hours as needed for moderate pain or headache.   amLODipine (NORVASC) 5 MG tablet Take 1 tablet (5 mg total) by mouth daily.   aspirin EC 81 MG tablet Take 81 mg by mouth daily.   atenolol (TENORMIN) 25 MG tablet Take 1 tablet (25 mg total) by mouth 2 (two) times daily.   calcium carbonate (TUMS - DOSED IN MG ELEMENTAL CALCIUM) 500 MG chewable tablet Chew 1 tablet by mouth 2 (two) times daily as needed for indigestion or heartburn.   Cholecalciferol (D3-50) 1.25 MG (50000 UT) capsule Take 1 capsule (50,000 Units total) by mouth every 14 (fourteen) days.   Cyanocobalamin (B-12) 1000 MCG SUBL Place 1 tablet under the tongue every Monday, Wednesday, and Friday.   ferrous sulfate 325 (65 FE) MG tablet Take 1 tablet (325 mg total) by mouth every other day.   hydrALAZINE (APRESOLINE) 25 MG tablet TAKE 1 TABLET (25 MG TOTAL) BY MOUTH 2 (TWO) TIMES DAILY AS NEEDED ((BP>160/100)).   nitroGLYCERIN (NITROSTAT) 0.4 MG SL tablet  Place 1 tablet (0.4 mg total) under the tongue every 5 (five) minutes as needed for chest pain.   omeprazole (PRILOSEC) 20 MG capsule Take 1 capsule (20 mg total) by mouth daily.   triamcinolone cream (KENALOG) 0.1 % Apply 1 Application topically 2 (two) times daily.   [DISCONTINUED] CALCIUM-VITAMIN D PO Take 1 tablet by mouth 2 (two) times daily.   No facility-administered encounter medications on file as of 02/17/2023.    Allergies (verified) Morphine and codeine,  Lisinopril, and Losartan   History: Past Medical History:  Diagnosis Date   Aortic insufficiency    mild on echo 2020   B12 deficiency 03/25/2014   H/o small bowel obstruction with resection as well as colorectal cancer s/p partial colectomy.  Receives B12 injections    Cancer [facility]) 2001, 2008   colon, rectal   Carotid stenosis, asymptomatic, bilateral    1-39% right carotid stenosis by dopplers 03/2022   Chest pain, atypical    no ischemia on nuclear stress test 2019   COVID-19 virus infection 01/14/2021   Treated with molnupiravir.    History of chicken pox    History of Clostridium difficile 2012   History of DVT (deep vein thrombosis) 2002   right leg, after chemo for first cancer, treated   Hypertension    IDA (iron deficiency anemia)    Open wound of abdominal wall with complication    Osteoarthritis    Osteopenia 09/2011   DEXA 2013 spine -2.2, femur -1.6, DEXA 12/2015 - hip T score -1.9   SBO (small bowel obstruction) (HCC) 02/05/2011   On 02/05/11 she had enterolysis and small bowel resection    Past Surgical History:  Procedure Laterality Date   ABDOMINAL HYSTERECTOMY  1984   APPENDECTOMY  1984   BOWEL RESECTION  02/05/2011   Procedure: SMALL BOWEL RESECTION;  Surgeon: Shelly Rubenstein, MD;  Location: [facility] OR;  Service: General;  Laterality: N/A;   broken nose     CARDIOVASCULAR STRESS TEST  11/2015   WNL ([facility])   COLECTOMY     COLON SURGERY  2001 and 2008   COLONOSCOPY  12/2012   erosions at colonic anastomosis, o/w normal rpt 5 yrs (Dr. Evette Cristal at [place])   COLONOSCOPY  01/2018   WNL no rpt recommended ([facility])   COLOSTOMY     DEXA  09/2011   T-score: spine -2.2, femur -1.6   ESOPHAGOGASTRODUODENOSCOPY  01/31/2011   Procedure: ESOPHAGOGASTRODUODENOSCOPY (EGD);  Surgeon: Barrie Folk, MD;  Location: [facility] ENDOSCOPY;  Service: Endoscopy;  Laterality: N/A;  duodenal bx;s r/o giardia, r/o celiac diease   EYE SURGERY  2018   cataract surgery   EYE SURGERY  2019    laser surgery   FLEXIBLE SIGMOIDOSCOPY  01/31/2011   Procedure: FLEXIBLE SIGMOIDOSCOPY;  Surgeon: Barrie Folk, MD;  Location: [facility] ENDOSCOPY;  Service: Endoscopy;  Laterality: N/A;   ILEOSTOMY     JOINT REPLACEMENT  2010   knee    LAPAROTOMY  02/05/2011   Procedure: EXPLORATORY LAPAROTOMY;  Surgeon: Shelly Rubenstein, MD;  Location: [facility] OR;  Service: General;  Laterality: N/A;  lysis of adhesions.   NUCLEAR STRESS TEST  11/17/08   NORMAL   REPLACEMENT TOTAL KNEE  2010   RIGHT KNEE   REVERSE SHOULDER ARTHROPLASTY Right 07/28/2019   Procedure: REVERSE SHOULDER ARTHROPLASTY;  Surgeon: Francena Hanly, MD;  Location: [facility] ORS;  Service: Orthopedics;  Laterality: Right;  exparel   WOUND DEBRIDEMENT N/A 05/21/2012   Procedure: Excision non healing  ABDOMINAL WOUND;  Surgeon: Shelly Rubenstein, MD;  Location: [facility] OR;  Service: General;  Laterality: N/A;   Family History  Problem Relation Age of Onset   Heart disease Mother        valve problems   Heart attack Mother    Congestive Heart Failure Mother    Heart disease Father        due to lupus   Lupus Father    Heart attack Father    Colon cancer Paternal Grandmother    Coronary artery disease Brother        s/p CABG   Coronary artery disease Sister        s/p CABG   Heart attack Brother    Heart attack Sister    Diabetes Neg Hx    Stroke Neg Hx    Hypertension Neg Hx    Social History   Socioeconomic History   Marital status: Widowed    Spouse name: Not on file   Number of children: Not on file   Years of education: Not on file   Highest education level: Not on file  Occupational History   Not on file  Tobacco Use   Smoking status: Never   Smokeless tobacco: Never  Vaping Use   Vaping status: Never Used  Substance and Sexual Activity   Alcohol use: No   Drug use: No   Sexual activity: Not Currently  Other Topics Concern   Not on file  Social History Narrative   Caffeine: decaf   Lives alone, daughter lives nearby to visit.    Occupation: retired, worked at [employer] office   Edu: HS   Activity: tries to get walking, walks up and down steps at home   Diet: good water, fruits/vegetables daily.  1 cup milk/day, cheese.      Cards: Dr. Patty Sermons   Surgeon: Dr. Rayburn Ma   Social Determinants of Health   Financial Resource Strain: Low Risk  (02/17/2023)   Overall Financial Resource Strain (CARDIA)    Difficulty of Paying Living Expenses: Not hard at all  Food Insecurity: No Food Insecurity (02/17/2023)   Hunger Vital Sign    Worried About Running Out of Food in the Last Year: Never true    Ran Out of Food in the Last Year: Never true  Transportation Needs: No Transportation Needs (02/17/2023)   PRAPARE - Administrator, Civil Service (Medical): No    Lack of Transportation (Non-Medical): No  Physical Activity: Insufficiently Active (02/17/2023)   Exercise Vital Sign    Days of Exercise per Week: 3 days    Minutes of Exercise per Session: 30 min  Stress: No Stress Concern Present (02/17/2023)   [employer] of Occupational Health - Occupational Stress Questionnaire    Feeling of Stress : Not at all  Social Connections: Moderately Integrated (02/17/2023)   Social Connection and Isolation Panel [NHANES]    Frequency of Communication with Friends and Family: More than three times a week    Frequency of Social Gatherings with Friends and Family: More than three times a week    Attends Religious Services: More than 4 times per year    Active Member of [employer] or Organizations: Yes    Attends Banker Meetings: More than 4 times per year    Marital Status: Widowed    Tobacco Counseling Counseling given: Not Answered   Clinical Intake:  Pre-visit preparation completed: Yes  Pain : 0-10 Pain Score: 5  Pain Type:  Chronic pain Pain Location: Shoulder Pain Orientation: Right Pain Descriptors / Indicators: Aching Pain Onset: More than a month ago Pain Frequency:  Intermittent Pain Relieving Factors: ice, Tylenol  Pain Relieving Factors: ice, Tylenol  BMI - recorded: 28.99 Nutritional Status: BMI 25 -29 Overweight Nutritional Risks: None Diabetes: No  How often do you need to have someone help you when you read instructions, pamphlets, or other written materials from your doctor or pharmacy?: 1 - Never  Interpreter Needed?: No  Comments: lives alone Information entered by :: B.Markie Frith,LPN   Activities of Daily Living    02/17/2023    1:06 PM  In your present state of health, do you have any difficulty performing the following activities:  Hearing? 0  Vision? 0  Difficulty concentrating or making decisions? 0  Walking or climbing stairs? 0  Dressing or bathing? 0  Doing errands, shopping? 0  Preparing Food and eating ? N  Using the Toilet? N  In the past six months, have you accidently leaked urine? Y  Do you have problems with loss of bowel control? Y  Managing your Medications? N  Managing your Finances? N  Housekeeping or managing your Housekeeping? N    Patient Care Team: Eustaquio Boyden, MD as PCP - General (Family Medicine) Quintella Reichert, MD as PCP - Cardiology (Cardiology) Pa, [facility]  Indicate any recent Medical Services you may have received from other than [facility] providers in the past year (date may be approximate).     Assessment:   This is a routine wellness examination for Priscilla [name].  Hearing/Vision screen Hearing Screening - Comments:: Pt says her hearing is good  Vision Screening - Comments:: -Pt says she sees well after cataract surgery;readers only [facility]-yearly soon Dec   Goals Addressed   None    Depression Screen    02/17/2023    1:04 PM 02/18/2022   11:23 AM 02/12/2022    8:24 AM 02/04/2021   11:25 AM 01/31/2020    9:11 AM 11/11/2018    9:36 AM 10/28/2017    8:25 AM  PHQ 2/9 Scores  PHQ - 2 Score 0 0 0 0 0 0 0  PHQ- 9 Score     0  0    Fall Risk     02/17/2023    1:00 PM 02/18/2022   11:23 AM 02/12/2022    8:26 AM 02/04/2021   11:24 AM 01/31/2020    9:10 AM  Fall Risk   Falls in the past year? 0 0 0 1 0  Number falls in past yr: 0  0 0 0  Injury with Fall? 0  0 0 0  Risk for fall due to : No Fall Risks  No Fall Risks Other (Comment) Medication side effect  Risk for fall due to: Comment    missed a step   Follow up Education provided;Falls prevention discussed  Falls prevention discussed Falls prevention discussed Falls evaluation completed;Falls prevention discussed    MEDICARE RISK AT HOME: Medicare Risk at Home Any stairs in or around the home?: Yes If so, are there any without handrails?: Yes Home free of loose throw rugs in walkways, pet beds, electrical cords, etc?: Yes Adequate lighting in your home to reduce risk of falls?: Yes Life alert?: No Use of a cane, walker or w/c?: Yes (cane outside) Grab bars in the bathroom?: No Shower chair or bench in shower?: Yes Elevated toilet seat or a handicapped toilet?: Yes  TIMED UP AND GO:  Was  the test performed?  No    Cognitive Function:    01/31/2020    9:13 AM 10/28/2017    8:26 AM  MMSE - Mini Mental State Exam  Orientation to time 5 5  Orientation to Place 5 5  Registration 3 3  Attention/ Calculation 5 0  Recall 3 3  Language- name 2 objects  0  Language- repeat 1 1  Language- follow 3 step command  3  Language- read & follow direction  0  Write a sentence  0  Copy design  0  Total score  20        02/17/2023    1:07 PM 02/18/2022   11:23 AM 02/12/2022    8:28 AM  6CIT Screen  What Year? 0 points 0 points 0 points  What month? 0 points 0 points 0 points  What time? 0 points 0 points 0 points  Count back from 20 0 points 0 points 0 points  Months in reverse 0 points 0 points 0 points  Repeat phrase 0 points 0 points 0 points  Total Score 0 points 0 points 0 points    Immunizations Immunization History  Administered Date(s) Administered   Fluad  Quad(high Dose 65+) 12/23/2019, 02/18/2022   Influenza Split 12/11/2011, 12/23/2014   Influenza, High Dose Seasonal PF 01/12/2017, 01/27/2018, 02/08/2021   Influenza,inj,Quad PF,6+ Mos 12/25/2015, 11/11/2018   PFIZER(Purple Top)SARS-COV-2 Vaccination 04/20/2019, 05/11/2019, 01/03/2020   Pneumococcal Conjugate-13 09/12/2013   Pneumococcal Polysaccharide-23 03/25/2007   Td 09/11/2011   Td (Adult), 2 Lf Tetanus Toxid, Preservative Free 09/11/2011   Zoster, Live 05/09/2014    TDAP status: Up to date  Flu Vaccine status: Up to date  Pneumococcal vaccine status: Up to date  Covid-19 vaccine status: Completed vaccines  Qualifies for Shingles Vaccine? Yes   Zostavax completed No   Shingrix Completed?: No.    Education has been provided regarding the importance of this vaccine. Patient has been advised to call insurance company to determine out of pocket expense if they have not yet received this vaccine. Advised may also receive vaccine at local pharmacy or Health Dept. Verbalized acceptance and understanding.  Screening Tests Health Maintenance  Topic Date Due   COVID-19 Vaccine (4 - 2023-24 season) 03/05/2023 (Originally 11/23/2022)   Zoster Vaccines- Shingrix (1 of 2) 05/20/2023 (Originally [birth year]/20/1982)   DTaP/Tdap/Td (2 - Tdap) 02/17/2024 (Originally 09/10/2021)   Medicare Annual Wellness (AWV)  02/17/2024   Pneumonia Vaccine 26+ Years old  Completed   INFLUENZA VACCINE  Completed   DEXA SCAN  Completed   HPV VACCINES  Aged Out   MAMMOGRAM  Discontinued   Colonoscopy  Discontinued    Health Maintenance  There are no preventive care reminders to display for this patient.   Colorectal cancer screening: No longer required.   Mammogram status: No longer required due to age.  Lung Cancer Screening: (Low Dose CT Chest recommended if Age 62-80 years, 20 pack-year currently smoking OR have quit w/in 15years.) does not qualify.   Lung Cancer Screening Referral: no  Additional  Screening:  Hepatitis C Screening: does not qualify; Completed no  Vision Screening: Recommended annual ophthalmology exams for early detection of glaucoma and other disorders of the eye. Is the patient up to date with their annual eye exam?  Yes  Who is the provider or what is the name of the office in which the patient attends annual eye exams? [facility] If pt is not established with a provider, would they like to  be referred to a provider to establish care? No .   Dental Screening: Recommended annual dental exams for proper oral hygiene  Diabetic Foot Exam: n/a  Community Resource Referral / Chronic Care Management: CRR required this visit?  No   CCM required this visit?  No    Plan:     I have personally reviewed and noted the following in the patient's chart:   Medical and social history Use of alcohol, tobacco or illicit drugs  Current medications and supplements including opioid prescriptions. Patient is not currently taking opioid prescriptions. Functional ability and status Nutritional status Physical activity Advanced directives List of other physicians Hospitalizations, surgeries, and ER visits in previous 12 months Vitals Screenings to include cognitive, depression, and falls Referrals and appointments  In addition, I have reviewed and discussed with patient certain preventive protocols, quality metrics, and best practice recommendations. A written personalized care plan for preventive services as well as general preventive health recommendations were provided to patient.   Sue Lush, LPN   16/12/9602   After Visit Summary: (MyChart) Due to this being a telephonic visit, the after visit summary with patients personalized plan was offered to patient via MyChart   Nurse Notes: The patient states she is doing well. She has concerns about urinary incontinence, what can be done to improve her symptoms. She would like to ask at PE appt in  December.

## 2023-02-25 ENCOUNTER — Ambulatory Visit: Payer: Medicare Other | Admitting: Family Medicine

## 2023-02-25 ENCOUNTER — Encounter: Payer: Self-pay | Admitting: Family Medicine

## 2023-02-25 VITALS — BP 196/70 | HR 72 | Temp 97.8°F | Ht 58.5 in | Wt 145.1 lb

## 2023-02-25 DIAGNOSIS — Z85038 Personal history of other malignant neoplasm of large intestine: Secondary | ICD-10-CM

## 2023-02-25 DIAGNOSIS — E538 Deficiency of other specified B group vitamins: Secondary | ICD-10-CM

## 2023-02-25 DIAGNOSIS — J019 Acute sinusitis, unspecified: Secondary | ICD-10-CM | POA: Diagnosis not present

## 2023-02-25 DIAGNOSIS — N898 Other specified noninflammatory disorders of vagina: Secondary | ICD-10-CM | POA: Diagnosis not present

## 2023-02-25 DIAGNOSIS — I10 Essential (primary) hypertension: Secondary | ICD-10-CM | POA: Diagnosis not present

## 2023-02-25 DIAGNOSIS — M85852 Other specified disorders of bone density and structure, left thigh: Secondary | ICD-10-CM

## 2023-02-25 DIAGNOSIS — R8290 Unspecified abnormal findings in urine: Secondary | ICD-10-CM

## 2023-02-25 DIAGNOSIS — E559 Vitamin D deficiency, unspecified: Secondary | ICD-10-CM

## 2023-02-25 DIAGNOSIS — R32 Unspecified urinary incontinence: Secondary | ICD-10-CM

## 2023-02-25 DIAGNOSIS — Z85048 Personal history of other malignant neoplasm of rectum, rectosigmoid junction, and anus: Secondary | ICD-10-CM | POA: Diagnosis not present

## 2023-02-25 DIAGNOSIS — M150 Primary generalized (osteo)arthritis: Secondary | ICD-10-CM | POA: Diagnosis not present

## 2023-02-25 DIAGNOSIS — Z9049 Acquired absence of other specified parts of digestive tract: Secondary | ICD-10-CM | POA: Diagnosis not present

## 2023-02-25 DIAGNOSIS — D539 Nutritional anemia, unspecified: Secondary | ICD-10-CM | POA: Diagnosis not present

## 2023-02-25 DIAGNOSIS — Z7189 Other specified counseling: Secondary | ICD-10-CM

## 2023-02-25 LAB — URINALYSIS, ROUTINE W REFLEX MICROSCOPIC
Bilirubin Urine: NEGATIVE
Hgb urine dipstick: NEGATIVE
Ketones, ur: NEGATIVE
Nitrite: NEGATIVE
Specific Gravity, Urine: 1.01 (ref 1.000–1.030)
Total Protein, Urine: NEGATIVE
Urine Glucose: NEGATIVE
Urobilinogen, UA: 0.2 (ref 0.0–1.0)
pH: 7 (ref 5.0–8.0)

## 2023-02-25 LAB — COMPREHENSIVE METABOLIC PANEL
ALT: 14 U/L (ref 0–35)
AST: 19 U/L (ref 0–37)
Albumin: 4.6 g/dL (ref 3.5–5.2)
Alkaline Phosphatase: 91 U/L (ref 39–117)
BUN: 19 mg/dL (ref 6–23)
CO2: 29 meq/L (ref 19–32)
Calcium: 9.9 mg/dL (ref 8.4–10.5)
Chloride: 101 meq/L (ref 96–112)
Creatinine, Ser: 0.79 mg/dL (ref 0.40–1.20)
GFR: 65.96 mL/min (ref >60.00)
Glucose, Bld: 108 mg/dL — ABNORMAL HIGH (ref 70–99)
Potassium: 4.4 meq/L (ref 3.5–5.1)
Sodium: 137 meq/L (ref 135–145)
Total Bilirubin: 0.8 mg/dL (ref 0.2–1.2)
Total Protein: 7 g/dL (ref 6.0–8.3)

## 2023-02-25 LAB — LIPID PANEL
Cholesterol: 204 mg/dL — ABNORMAL HIGH (ref 0–200)
HDL: 86.5 mg/dL (ref >39.00)
LDL Cholesterol: 96 mg/dL (ref 0–99)
NonHDL: 117.68
Total CHOL/HDL Ratio: 2
Triglycerides: 108 mg/dL (ref 0.0–149.0)
VLDL: 21.6 mg/dL (ref 0.0–40.0)

## 2023-02-25 LAB — CBC WITH DIFFERENTIAL/PLATELET
Basophils Absolute: 0.1 10*3/uL (ref 0.0–0.1)
Basophils Relative: 0.9 % (ref 0.0–3.0)
Eosinophils Absolute: 0.2 10*3/uL (ref 0.0–0.7)
Eosinophils Relative: 2.3 % (ref 0.0–5.0)
HCT: 39.4 % (ref 36.0–46.0)
Hemoglobin: 13 g/dL (ref 12.0–15.0)
Lymphocytes Relative: 24.2 % (ref 12.0–46.0)
Lymphs Abs: 1.6 10*3/uL (ref 0.7–4.0)
MCHC: 33 g/dL (ref 30.0–36.0)
MCV: 91.9 fL (ref 78.0–100.0)
Monocytes Absolute: 0.6 10*3/uL (ref 0.1–1.0)
Monocytes Relative: 8.5 % (ref 3.0–12.0)
Neutro Abs: 4.3 10*3/uL (ref 1.4–7.7)
Neutrophils Relative %: 64.1 % (ref 43.0–77.0)
Platelets: 372 10*3/uL (ref 150.0–400.0)
RBC: 4.28 Mil/uL (ref 3.87–5.11)
RDW: 12.6 % (ref 11.5–15.5)
WBC: 6.7 10*3/uL (ref 4.0–10.5)

## 2023-02-25 LAB — VITAMIN D 25 HYDROXY (VIT D DEFICIENCY, FRACTURES): VITD: 60.88 ng/mL (ref 30.00–100.00)

## 2023-02-25 LAB — IBC PANEL
Iron: 118 ug/dL (ref 42–145)
Saturation Ratios: 29.7 % (ref 20.0–50.0)
TIBC: 397.6 ug/dL (ref 250.0–450.0)
Transferrin: 284 mg/dL (ref 212.0–360.0)

## 2023-02-25 LAB — TSH: TSH: 2.06 u[IU]/mL (ref 0.35–5.50)

## 2023-02-25 LAB — FERRITIN: Ferritin: 75.4 ng/mL (ref 10.0–291.0)

## 2023-02-25 LAB — VITAMIN B12: Vitamin B-12: 813 pg/mL (ref 211–911)

## 2023-02-25 MED ORDER — D3-50 1.25 MG (50000 UT) PO CAPS
50000.0000 [IU] | ORAL_CAPSULE | ORAL | 4 refills | Status: AC
Start: 2023-02-25 — End: ?

## 2023-02-25 MED ORDER — MIRABEGRON ER 25 MG PO TB24: 25.0000 mg | ORAL_TABLET | Freq: Every day | ORAL | 3 refills | Status: DC

## 2023-02-25 MED ORDER — AMOXICILLIN 500 MG PO CAPS
500.0000 mg | ORAL_CAPSULE | Freq: Three times a day (TID) | ORAL | 0 refills | Status: AC
Start: 2023-02-25 — End: 2023-03-07

## 2023-02-25 MED ORDER — AMLODIPINE BESYLATE 5 MG PO TABS: 5.0000 mg | ORAL_TABLET | Freq: Every day | ORAL | 4 refills | Status: DC

## 2023-02-25 MED ORDER — OMEPRAZOLE 20 MG PO CPDR: 20.0000 mg | DELAYED_RELEASE_CAPSULE | Freq: Every day | ORAL | 4 refills | Status: DC

## 2023-02-25 MED ORDER — ATENOLOL 25 MG PO TABS: 25.0000 mg | ORAL_TABLET | Freq: Two times a day (BID) | ORAL | 4 refills | Status: AC

## 2023-02-25 MED ORDER — TRIAMCINOLONE ACETONIDE 0.1 % EX CREA
1.0000 | TOPICAL_CREAM | Freq: Two times a day (BID) | CUTANEOUS | 0 refills | Status: AC
Start: 2023-02-25 — End: ?

## 2023-02-25 NOTE — Assessment & Plan Note (Addendum)
Chronic, mixed incontinence stress and overflow.  Remote h/o bladder tack surgery.  She declines urogyn referral Rhys Martini /pessary.  Avoiding antimuscarinics to avoid cognitive side effects.  Trial Myrbetriq.  Check UA today.

## 2023-02-25 NOTE — Assessment & Plan Note (Signed)
Chronic, followed by ortho

## 2023-02-25 NOTE — Assessment & Plan Note (Signed)
Continue calcium in diet with calcium and vit d supplementation.

## 2023-02-25 NOTE — Assessment & Plan Note (Addendum)
Update labs on oral iron QOD.

## 2023-02-25 NOTE — Assessment & Plan Note (Signed)
No blood in stool

## 2023-02-25 NOTE — Assessment & Plan Note (Signed)
Previously discussed.

## 2023-02-25 NOTE — Progress Notes (Signed)
Ph: [phone] Fax: [phone]   Patient ID: Priscilla [name], female    DOB: 1931-03-08, 89 y.o.   MRN: [id number]  This visit was conducted in person.  BP (!) 196/66   Pulse 72   Temp 97.8 F (36.6 C) (Oral)   Ht 4' 10.5" (1.486 m)   Wt 145 lb 2 oz (65.8 kg)   SpO2 96%   BMI 29.81 kg/m    CC: AMW f/u visit  Subjective:   HPI: Priscilla [name] is a 89 y.o. female presenting on 02/25/2023 for Annual Exam (MCR prt 2 [AWV- 02/17/23].)   Saw health advisor last month for medicare wellness visit. Note reviewed.   No results found.  Flowsheet Row Clinical Support from 02/17/2023 in [facility] [place]  PHQ-2 Total Score 0          02/17/2023    1:00 PM 02/18/2022   11:23 AM 02/12/2022    8:26 AM 02/04/2021   11:24 AM 01/31/2020    9:10 AM  Fall Risk   Falls in the past year? 0 0 0 1 0  Number falls in past yr: 0  0 0 0  Injury with Fall? 0  0 0 0  Risk for fall due to : No Fall Risks  No Fall Risks Other (Comment) Medication side effect  Risk for fall due to: Comment    missed a step   Follow up Education provided;Falls prevention discussed  Falls prevention discussed Falls prevention discussed Falls evaluation completed;Falls prevention discussed    S/p R knee replacement 2010 with residual pain - has been seeing ortho Dr Charlann Boxer s/p bone scan showing hardware movement, Rx meloxicam. Also having R shoulder pain at site of prior replacement 2021.  She did have L knee steroid injection with benefit.  Requests DMV handicap placard application.   10d h/o ST, hoarse voice, cough, R earache with muffled hearing. Also more unsteady on her feet with this. Notes blowing out colored mucous.   Known white coat hypertension. Home readings largely well controlled. She continues amlodipine 5mg  daily, atenolol 25mg  twice daily and hydralazine 25mg  BID PRN BP >160/100. Hydrochlorothiazide stopped 07/2021 due to orthostatic dizziness - which subsequently  resolved. Has not recently taken hydralazine.  BP at home yesterday 120/48, highest 140/51. Also sees cardiology yearly (Dr Mayford Knife). Nuclear stress test 11/2020 without ischemia.    Preventative: H/o colon and rectal cancer. H/o bowel resection for SBO 2012 as well as colon cancers.  COLONOSCOPY Date: 12/2012 erosions at colonic anastomosis, o/w normal rpt 5 yrs (Dr. Evette Cristal at [place]).  COLONOSCOPY 01/2018 - WNL no rpt recommended ([facility]).  No recent blood in stool or bowel changes. Chronically frequent urgent bowel movements.  Well woman - aged out of pap smears. Mammogram - 03/2020 Birads1 @ [place]. Discussed Q38yr mammo. Declines repeating - recommend home breast exams monthly DEXA 12/2015 - hip T score -1.9. agrees to repeat given recent humeral fracture.  DEXA 03/2020 - L femur neck -1.6  Flu - yearly COVID vaccine Pfizer 03/2019, 04/2019, 12/2019  Td - 08/2011  Peumovax 2009. Prevnar-13 08/2013  zostavax 04/2014 Shingrix - discussed, declines  Advanced directives - copy in chart 08/2015. HCPOA would be son [name]) and daughter [name]), also would be ok with other children helping. Ok with CPR, intubation and limited trial for reversible conditions, does not want prolonged life support if terminal condition.  Seat belt use discussed Sunscreen use discussed. No changing moles on skin. Non  smoker Alcohol - none Dentist - yearly - all teeth have crowns Eye exam - yearly  Bowel - no constipation, chronic intermittent diarrhea after lower GI surgeries Bladder - complete incontinence at night time (unaware), wears depends and pad. Also uses azo and cranberry tablets. H/o remote bladder tack surgery. + stress incontinence. No dysuria, daytime urgency or frequency or fevers/chills.   Caffeine: decaf   Lives alone, daughter lives nearby to visit. Widow for 16 yrs   Occupation: retired, worked at [employer] office   Edu: HS   Activity: yardwork, walks up and down steps at home   Diet: good water,  fruits/vegetables daily. 1 cup milk/day, cheese     Relevant past medical, surgical, family and social history reviewed and updated as indicated. Interim medical history since our last visit reviewed. Allergies and medications reviewed and updated. Outpatient Medications Prior to Visit  Medication Sig Dispense Refill   acetaminophen (TYLENOL) 500 MG tablet Take 500-1,000 mg by mouth every 6 (six) hours as needed for moderate pain or headache.     aspirin EC 81 MG tablet Take 81 mg by mouth daily.     Calcium Carbonate (CALCIUM 600 PO) Take 1 tablet by mouth at bedtime.     Cyanocobalamin (B-12) 1000 MCG SUBL Place 1 tablet under the tongue every Monday, Wednesday, and Friday.     ferrous sulfate 325 (65 FE) MG tablet Take 1 tablet (325 mg total) by mouth every other day.     hydrALAZINE (APRESOLINE) 25 MG tablet TAKE 1 TABLET (25 MG TOTAL) BY MOUTH 2 (TWO) TIMES DAILY AS NEEDED ((BP>160/100)). 45 tablet 3   nitroGLYCERIN (NITROSTAT) 0.4 MG SL tablet Place 1 tablet (0.4 mg total) under the tongue every 5 (five) minutes as needed for chest pain. 25 tablet 1   amLODipine (NORVASC) 5 MG tablet Take 1 tablet (5 mg total) by mouth daily. 90 tablet 3   atenolol (TENORMIN) 25 MG tablet Take 1 tablet (25 mg total) by mouth 2 (two) times daily. 180 tablet 3   calcium carbonate (TUMS - DOSED IN MG ELEMENTAL CALCIUM) 500 MG chewable tablet Chew 1 tablet by mouth 2 (two) times daily as needed for indigestion or heartburn.     Cholecalciferol (D3-50) 1.25 MG (50000 UT) capsule Take 1 capsule (50,000 Units total) by mouth every 14 (fourteen) days.     omeprazole (PRILOSEC) 20 MG capsule Take 1 capsule (20 mg total) by mouth daily. 90 capsule 3   triamcinolone cream (KENALOG) 0.1 % Apply 1 Application topically 2 (two) times daily. 30 g 0   No facility-administered medications prior to visit.     Per HPI unless specifically indicated in ROS section below Review of Systems  Objective:  BP (!) 196/66    Pulse 72   Temp 97.8 F (36.6 C) (Oral)   Ht 4' 10.5" (1.486 m)   Wt 145 lb 2 oz (65.8 kg)   SpO2 96%   BMI 29.81 kg/m   Wt Readings from Last 3 Encounters:  02/25/23 145 lb 2 oz (65.8 kg)  02/17/23 146 lb (66.2 kg)  04/03/22 146 lb (66.2 kg)      Physical Exam Vitals and nursing note reviewed.  Constitutional:      Appearance: Normal appearance. She is not ill-appearing.  HENT:     Head: Normocephalic and atraumatic.     Right Ear: Tympanic membrane, ear canal and external ear normal. There is no impacted cerumen.     Left Ear: Tympanic membrane, ear  canal and external ear normal. There is no impacted cerumen.     Mouth/Throat:     Mouth: Mucous membranes are moist.     Pharynx: Oropharynx is clear. No oropharyngeal exudate or posterior oropharyngeal erythema.  Eyes:     General:        Right eye: No discharge.        Left eye: No discharge.     Extraocular Movements: Extraocular movements intact.     Conjunctiva/sclera: Conjunctivae normal.     Pupils: Pupils are equal, round, and reactive to light.  Neck:     Thyroid: No thyroid mass or thyromegaly.     Vascular: No carotid bruit.  Cardiovascular:     Rate and Rhythm: Normal rate and regular rhythm.     Pulses: Normal pulses.     Heart sounds: Normal heart sounds. No murmur heard. Pulmonary:     Effort: Pulmonary effort is normal. No respiratory distress.     Breath sounds: Normal breath sounds. No wheezing, rhonchi or rales.  Abdominal:     General: Bowel sounds are normal. There is no distension.     Palpations: Abdomen is soft. There is no mass.     Tenderness: There is no abdominal tenderness. There is no guarding or rebound.     Hernia: No hernia is present.  Musculoskeletal:     Cervical back: Normal range of motion and neck supple. No rigidity.     Right lower leg: No edema.     Left lower leg: No edema.  Lymphadenopathy:     Cervical: No cervical adenopathy.  Skin:    General: Skin is warm and dry.      Findings: No rash.  Neurological:     General: No focal deficit present.     Mental Status: She is alert. Mental status is at baseline.  Psychiatric:        Mood and Affect: Mood normal.        Behavior: Behavior normal.       Results for orders placed or performed in visit on 02/18/22  Lipid panel  Result Value Ref Range   Cholesterol 194 0 - 200 mg/dL   Triglycerides 161.0 0.0 - 149.0 mg/dL   HDL 96.04 >54.09 mg/dL   VLDL 81.1 0.0 - 91.4 mg/dL   LDL Cholesterol 79 0 - 99 mg/dL   Total CHOL/HDL Ratio 2    NonHDL 103.60   Comprehensive metabolic panel  Result Value Ref Range   Sodium 137 135 - 145 mEq/L   Potassium 4.4 3.5 - 5.1 mEq/L   Chloride 102 96 - 112 mEq/L   CO2 26 19 - 32 mEq/L   Glucose, Bld 105 (H) 70 - 99 mg/dL   BUN 24 (H) 6 - 23 mg/dL   Creatinine, Ser 7.82 0.40 - 1.20 mg/dL   Total Bilirubin 0.5 0.2 - 1.2 mg/dL   Alkaline Phosphatase 94 39 - 117 U/L   AST 20 0 - 37 U/L   ALT 14 0 - 35 U/L   Total Protein 7.0 6.0 - 8.3 g/dL   Albumin 4.5 3.5 - 5.2 g/dL   GFR 95.62 (L) >13.08 mL/min   Calcium 9.5 8.4 - 10.5 mg/dL  VITAMIN D 25 Hydroxy (Vit-D Deficiency, Fractures)  Result Value Ref Range   VITD 92.62 30.00 - 100.00 ng/mL  CBC with Differential/Platelet  Result Value Ref Range   WBC 6.4 4.0 - 10.5 K/uL   RBC 3.97 3.87 - 5.11 Mil/uL  Hemoglobin 12.0 12.0 - 15.0 g/dL   HCT 16.1 09.6 - 04.5 %   MCV 90.7 78.0 - 100.0 fl   MCHC 33.3 30.0 - 36.0 g/dL   RDW 40.9 81.1 - 91.4 %   Platelets 311.0 150.0 - 400.0 K/uL   Neutrophils Relative % 65.6 43.0 - 77.0 %   Lymphocytes Relative 22.4 12.0 - 46.0 %   Monocytes Relative 8.9 3.0 - 12.0 %   Eosinophils Relative 1.9 0.0 - 5.0 %   Basophils Relative 1.2 0.0 - 3.0 %   Neutro Abs 4.2 1.4 - 7.7 K/uL   Lymphs Abs 1.4 0.7 - 4.0 K/uL   Monocytes Absolute 0.6 0.1 - 1.0 K/uL   Eosinophils Absolute 0.1 0.0 - 0.7 K/uL   Basophils Absolute 0.1 0.0 - 0.1 K/uL  Vitamin B12  Result Value Ref Range   Vitamin B-12 541  211 - 911 pg/mL  TSH  Result Value Ref Range   TSH 2.30 0.35 - 5.50 uIU/mL  Ferritin  Result Value Ref Range   Ferritin 47.0 10.0 - 291.0 ng/mL  IBC panel  Result Value Ref Range   Iron 186 (H) 42 - 145 ug/dL   Transferrin 782.9 562.1 - 360.0 mg/dL   Saturation Ratios 30.8 20.0 - 50.0 %   TIBC 372.4 250.0 - 450.0 mcg/dL    Assessment & Plan:   Problem List Items Addressed This Visit     Advanced care planning/counseling discussion - Primary (Chronic)    Previously discussed      Osteoarthritis    Chronic, followed by ortho      History of colon cancer    No blood in stool.       History of rectal cancer    No blood in stool       Osteopenia    Continue calcium in diet with calcium and vit d supplementation.       Anemia associated with nutritional deficiency    Update labs on oral iron QOD.       Relevant Orders   CBC with Differential/Platelet   Ferritin   IBC panel   Vitamin D deficiency    Update levels on vit D q2 wks.       Relevant Medications   Cholecalciferol (D3-50) 1.25 MG (50000 UT) capsule   Other Relevant Orders   VITAMIN D 25 Hydroxy (Vit-D Deficiency, Fractures)   Vitamin B12 deficiency    Update levels on MWF oral replacement      Relevant Orders   Vitamin B12   White coat syndrome with hypertension    Chronic, BP markedly high today - but will treat according to home readings which are largely well controlled. Pt asxs today. I did ask her to check BP when she gets home and if persistently high to take hydralazine and update [place] if remaining elevated. She agrees with plan.  H/o orthostatic dizziness to hydrochlorothiazide       Relevant Medications   amLODipine (NORVASC) 5 MG tablet   atenolol (TENORMIN) 25 MG tablet   Other Relevant Orders   Lipid panel   Comprehensive metabolic panel   TSH   Incontinence in female    Chronic, mixed incontinence stress and overflow.  Remote h/o bladder tack surgery.  She declines urogyn  referral Rhys Martini /pessary.  Avoiding antimuscarinics to avoid cognitive side effects.  Trial Myrbetriq.  Check UA today.       Relevant Orders   Urinalysis, Routine w reflex microscopic   Vaginal irritation  Relevant Medications   triamcinolone cream (KENALOG) 0.1 %   Acute sinusitis    Ongoing symptoms for 10 days but symptoms are improving.  Reassurance provided, rec supportive measures at home  WASP for amoxicillin provided with indications when to fill.      Relevant Medications   amoxicillin (AMOXIL) 500 MG capsule   History of colon resection     Meds ordered this encounter  Medications   amLODipine (NORVASC) 5 MG tablet    Sig: Take 1 tablet (5 mg total) by mouth daily.    Dispense:  90 tablet    Refill:  4   triamcinolone cream (KENALOG) 0.1 %    Sig: Apply 1 Application topically 2 (two) times daily.    Dispense:  30 g    Refill:  0   Cholecalciferol (D3-50) 1.25 MG (50000 UT) capsule    Sig: Take 1 capsule (50,000 Units total) by mouth every 14 (fourteen) days.    Dispense:  6 capsule    Refill:  4   atenolol (TENORMIN) 25 MG tablet    Sig: Take 1 tablet (25 mg total) by mouth 2 (two) times daily.    Dispense:  180 tablet    Refill:  4   omeprazole (PRILOSEC) 20 MG capsule    Sig: Take 1 capsule (20 mg total) by mouth daily.    Dispense:  90 capsule    Refill:  4   mirabegron ER (MYRBETRIQ) 25 MG TB24 tablet    Sig: Take 1 tablet (25 mg total) by mouth daily.    Dispense:  30 tablet    Refill:  3   amoxicillin (AMOXIL) 500 MG capsule    Sig: Take 1 capsule (500 mg total) by mouth 3 (three) times daily for 10 days.    Dispense:  30 capsule    Refill:  0    Orders Placed This Encounter  Procedures   Lipid panel   Comprehensive metabolic panel   TSH   CBC with Differential/Platelet   Vitamin B12   Ferritin   IBC panel   VITAMIN D 25 Hydroxy (Vit-D Deficiency, Fractures)   Urinalysis, Routine w reflex microscopic    Patient Instructions  Labs  today Urinalysis today.  BP staying too high - check BP when you get home and if elevated, take hydralazine. Continue monitoring blood pressures at home as up to now.  Trial Myrbetriq (mirabegron) for bladder symptoms - keeping an eye on blood pressures with new medicine - update [place] with effect.  Medicines refilled today Try omeprazole every other day  Good to see you today Return as needed or in 1 year for next wellness visit   Follow up plan: Return if symptoms worsen or fail to improve.  Eustaquio Boyden, MD

## 2023-02-25 NOTE — Assessment & Plan Note (Signed)
Update levels on vit D q2 wks.

## 2023-02-25 NOTE — Assessment & Plan Note (Signed)
Chronic, BP markedly high today - but will treat according to home readings which are largely well controlled. Pt asxs today. I did ask her to check BP when she gets home and if persistently high to take hydralazine and update [place] if remaining elevated. She agrees with plan.  H/o orthostatic dizziness to hydrochlorothiazide

## 2023-02-25 NOTE — Patient Instructions (Addendum)
Labs today Urinalysis today.  BP staying too high - check BP when you get home and if elevated, take hydralazine. Continue monitoring blood pressures at home as up to now.  Trial Myrbetriq (mirabegron) for bladder symptoms - keeping an eye on blood pressures with new medicine - update [place] with effect.  Medicines refilled today Try omeprazole every other day  Good to see you today Return as needed or in 1 year for next wellness visit

## 2023-02-25 NOTE — Assessment & Plan Note (Signed)
Ongoing symptoms for 10 days but symptoms are improving.  Reassurance provided, rec supportive measures at home  WASP for amoxicillin provided with indications when to fill.

## 2023-02-25 NOTE — Assessment & Plan Note (Signed)
Update levels on MWF oral replacement.

## 2023-02-26 NOTE — Addendum Note (Signed)
Addended by: Eustaquio Boyden on: 02/26/2023 09:12 AM   Modules accepted: Orders

## 2023-02-26 NOTE — Addendum Note (Signed)
Addended by: Eustaquio Boyden on: 02/26/2023 09:10 AM   Modules accepted: Level of Service

## 2023-03-02 ENCOUNTER — Telehealth: Payer: Self-pay | Admitting: Family Medicine

## 2023-03-02 NOTE — Telephone Encounter (Signed)
Lvm asking pt to call back. Need to relay results, Dr Timoteo Expose message and schedule lab visit for rpt urine collection for culture [future order placed].

## 2023-03-02 NOTE — Telephone Encounter (Signed)
Patient returned call regarding lab results.I relayed message from Dr Reece Agar to her.She verbalized understanding and I have her scheduled for urinalysis.

## 2023-03-02 NOTE — Telephone Encounter (Signed)
Noted  

## 2023-03-02 NOTE — Telephone Encounter (Signed)
I don't see where pt read mychart results.  Plz notify: Your cholesterol levels returned ok. Your sugar was overall ok. Your kidneys, liver, thyroid, blood counts, and vitamin B12 vitamin D and iron levels returned normal. Continue current medicines/supplements Your urine test returned with possible white cells/bacteria - we were unable to send culture so I would like her to return for repeat urine collection in office and send UCx (ordered).

## 2023-03-03 ENCOUNTER — Other Ambulatory Visit: Payer: Medicare Other

## 2023-03-03 DIAGNOSIS — R32 Unspecified urinary incontinence: Secondary | ICD-10-CM

## 2023-03-03 DIAGNOSIS — R8290 Unspecified abnormal findings in urine: Secondary | ICD-10-CM

## 2023-03-04 LAB — URINE CULTURE
MICRO NUMBER:: 15831911
Result:: NO GROWTH
SPECIMEN QUALITY:: ADEQUATE

## 2023-04-03 DIAGNOSIS — H43813 Vitreous degeneration, bilateral: Secondary | ICD-10-CM | POA: Diagnosis not present

## 2023-04-03 DIAGNOSIS — H04123 Dry eye syndrome of bilateral lacrimal glands: Secondary | ICD-10-CM | POA: Diagnosis not present

## 2023-04-03 DIAGNOSIS — Z961 Presence of intraocular lens: Secondary | ICD-10-CM | POA: Diagnosis not present

## 2023-04-23 ENCOUNTER — Telehealth: Payer: Self-pay

## 2023-04-23 DIAGNOSIS — R32 Unspecified urinary incontinence: Secondary | ICD-10-CM

## 2023-04-23 NOTE — Telephone Encounter (Signed)
Noted

## 2023-04-23 NOTE — Telephone Encounter (Signed)
Copied from CRM [id number]. Topic: Clinical - Prescription Issue >> Apr 23, 2023  9:37 AM Lorretta Harp wrote: Reason for CRM: Patient called to let Dr. Renee Ramus that she had discontinued the medication mirabegron ER (MYRBETRIQ) 25 MG TB24 tablet because it raised her blood pressure some, and her insurance would not cover it this year because her plan had changed

## 2023-05-26 NOTE — Progress Notes (Unsigned)
 Date:  05/27/2023   ID:  Priscilla [name], DOB 1931-02-06, MRN [id number]  PCP:  Eustaquio Boyden, MD  Cardiologist:  Armanda Magic, MD Electrophysiologist:  None   Chief Complaint:  Chest pain, HTN and aortic insufficiency  History of Present Illness:    Priscilla [name] is a 89 y.o. female with a hx of HTN, mild aortic insufficiency and mild mitral regurgitation.  Nuclear stress test 07/2017 and 11/2020 for SOB showed no ischemia and 2D echo 2019/2022 showed normal LVF with mild AI and mild MR.    She is here today for followup and is doing well.  She denies any chest pain or pressure, SOB, DOE(except with overexertion), PND, orthopnea, LE edema, dizziness, palpitations or syncope. She is compliant with her meds and is tolerating meds with no SE.    Prior CV studies:   The following studies were reviewed today: Nuclear stress test and 2D echo as well as carotid Dopplers  Past Medical History:  Diagnosis Date   Aortic insufficiency    mild on echo 2020   B12 deficiency 03/25/2014   H/o small bowel obstruction with resection as well as colorectal cancer s/p partial colectomy.  Receives B12 injections    Cancer [facility]) 2001, 2008   colon, rectal   Carotid stenosis, asymptomatic, bilateral    1-39% right carotid stenosis by dopplers 03/2022   Chest pain, atypical    no ischemia on nuclear stress test 2019   COVID-19 virus infection 01/14/2021   Treated with molnupiravir.    History of chicken pox    History of Clostridium difficile 2012   History of DVT (deep vein thrombosis) 2002   right leg, after chemo for first cancer, treated   Hypertension    IDA (iron deficiency anemia)    Open wound of abdominal wall with complication    Osteoarthritis    Osteopenia 09/2011   DEXA 2013 spine -2.2, femur -1.6, DEXA 12/2015 - hip T score -1.9   SBO (small bowel obstruction) (HCC) 02/05/2011   On 02/05/11 she had enterolysis and small bowel resection    Past Surgical History:  Procedure  Laterality Date   ABDOMINAL HYSTERECTOMY  1984   APPENDECTOMY  1984   BOWEL RESECTION  02/05/2011   Procedure: SMALL BOWEL RESECTION;  Surgeon: Shelly Rubenstein, MD;  Location: [facility] OR;  Service: General;  Laterality: N/A;   broken nose     CARDIOVASCULAR STRESS TEST  11/2015   WNL (Ramari Bray)   COLECTOMY     COLON SURGERY  2001 and 2008   COLONOSCOPY  12/2012   erosions at colonic anastomosis, o/w normal rpt 5 yrs (Dr. Evette Cristal at [place])   COLONOSCOPY  01/2018   WNL no rpt recommended ([facility])   COLOSTOMY     DEXA  09/2011   T-score: spine -2.2, femur -1.6   ESOPHAGOGASTRODUODENOSCOPY  01/31/2011   Procedure: ESOPHAGOGASTRODUODENOSCOPY (EGD);  Surgeon: Barrie Folk, MD;  Location: [facility] ENDOSCOPY;  Service: Endoscopy;  Laterality: N/A;  duodenal bx;s r/o giardia, r/o celiac diease   EYE SURGERY  2018   cataract surgery   EYE SURGERY  2019   laser surgery   FLEXIBLE SIGMOIDOSCOPY  01/31/2011   Procedure: FLEXIBLE SIGMOIDOSCOPY;  Surgeon: Barrie Folk, MD;  Location: [facility] ENDOSCOPY;  Service: Endoscopy;  Laterality: N/A;   ILEOSTOMY     JOINT REPLACEMENT  2010   knee    LAPAROTOMY  02/05/2011   Procedure: EXPLORATORY LAPAROTOMY;  Surgeon: Shelly Rubenstein, MD;  Location: [facility] OR;  Service: General;  Laterality: N/A;  lysis of adhesions.   NUCLEAR STRESS TEST  11/17/08   NORMAL   REPLACEMENT TOTAL KNEE  2010   RIGHT KNEE   REVERSE SHOULDER ARTHROPLASTY Right 07/28/2019   Procedure: REVERSE SHOULDER ARTHROPLASTY;  Surgeon: Francena Hanly, MD;  Location: [facility] ORS;  Service: Orthopedics;  Laterality: Right;  exparel   WOUND DEBRIDEMENT N/A 05/21/2012   Procedure: Excision non healing ABDOMINAL WOUND;  Surgeon: Shelly Rubenstein, MD;  Location: [facility] OR;  Service: General;  Laterality: N/A;     Current Meds  Medication Sig   acetaminophen (TYLENOL) 500 MG tablet Take 500-1,000 mg by mouth every 6 (six) hours as needed for moderate pain or headache.   amLODipine (NORVASC) 5 MG tablet Take 1 tablet (5 mg  total) by mouth daily.   aspirin EC 81 MG tablet Take 81 mg by mouth daily.   atenolol (TENORMIN) 25 MG tablet Take 1 tablet (25 mg total) by mouth 2 (two) times daily.   Calcium Carbonate (CALCIUM 600 PO) Take 1 tablet by mouth at bedtime.   Cholecalciferol (D3-50) 1.25 MG (50000 UT) capsule Take 1 capsule (50,000 Units total) by mouth every 14 (fourteen) days.   Cyanocobalamin (B-12) 1000 MCG SUBL Place 1 tablet under the tongue every Monday, Wednesday, and Friday.   ferrous sulfate 325 (65 FE) MG tablet Take 1 tablet (325 mg total) by mouth every other day.   hydrALAZINE (APRESOLINE) 25 MG tablet TAKE 1 TABLET (25 MG TOTAL) BY MOUTH 2 (TWO) TIMES DAILY AS NEEDED ((BP>160/100)).   meloxicam (MOBIC) 7.5 MG tablet Take 7.5 mg by mouth daily as needed for pain.   nitroGLYCERIN (NITROSTAT) 0.4 MG SL tablet Place 1 tablet (0.4 mg total) under the tongue every 5 (five) minutes as needed for chest pain.   omeprazole (PRILOSEC) 20 MG capsule Take 1 capsule (20 mg total) by mouth daily.   triamcinolone cream (KENALOG) 0.1 % Apply 1 Application topically 2 (two) times daily.     Allergies:   Morphine and codeine, Lisinopril, and Losartan   Social History   Tobacco Use   Smoking status: Never   Smokeless tobacco: Never  Vaping Use   Vaping status: Never Used  Substance Use Topics   Alcohol use: No   Drug use: No     Family Hx: The patient's family history includes Colon cancer in her paternal grandmother; Congestive Heart Failure in her mother; Coronary artery disease in her brother and sister; Heart attack in her brother, father, mother, and sister; Heart disease in her father and mother; Lupus in her father. There is no history of Diabetes, Stroke, or Hypertension.  ROS:   Please see the history of present illness.    All other systems reviewed and are negative.   Labs/Other Tests and Data Reviewed:    EKG Interpretation Date/Time:  Wednesday May 27 2023 09:26:31 EST Ventricular  Rate:  62 PR Interval:  254 QRS Duration:  74 QT Interval:  406 QTC Calculation: 412 R Axis:   -45  Text Interpretation: Sinus rhythm with 1st degree A-V block Left axis deviation Septal infarct (cited on or before 19-Jul-2019) When compared with ECG of 19-Jul-2019 11:10, Questionable change in initial forces of Septal leads Confirmed by Armanda Magic ([id number]) on 05/27/2023 9:36:01 AM    Recent Labs: 02/25/2023: ALT 14; BUN 19; Creatinine, Ser 0.79; Hemoglobin 13.0; Platelets 372.0; Potassium 4.4; Sodium 137; TSH 2.06   Recent Lipid Panel Lab Results  Component  Value Date/Time   CHOL 204 (H) 02/25/2023 11:14 AM   TRIG 108.0 02/25/2023 11:14 AM   HDL 86.50 02/25/2023 11:14 AM   CHOLHDL 2 02/25/2023 11:14 AM   LDLCALC 96 02/25/2023 11:14 AM   LDLDIRECT 101.7 12/12/2010 08:46 AM    Wt Readings from Last 3 Encounters:  05/27/23 148 lb 9.6 oz (67.4 kg)  02/25/23 145 lb 2 oz (65.8 kg)  02/17/23 146 lb (66.2 kg)     Objective:    Vital Signs:  BP (!) 152/60   Pulse 66   Ht 4' 11.5" (1.511 m)   Wt 148 lb 9.6 oz (67.4 kg)   SpO2 97%   BMI 29.51 kg/m   GEN: Well nourished, well developed in no acute distress HEENT: Normal NECK: No JVD; No carotid bruits LYMPHATICS: No lymphadenopathy CARDIAC:RRR, no murmurs, rubs, gallops RESPIRATORY:  Clear to auscultation without rales, wheezing or rhonchi  ABDOMEN: Soft, non-tender, non-distended MUSCULOSKELETAL:  No edema; No deformity  SKIN: Warm and dry NEUROLOGIC:  Alert and oriented x 3 PSYCHIATRIC:  Normal affect  ASSESSMENT & PLAN:    Hx of Chest pain/DOE -Nuclear stress test 12/20/2020 showed no ischemia -she denies any CP or SOB -2D echo 11/2020 showed normal LV function with grade 1 diastolic dysfunction and mild MR and AR  Hypertension  -BP borderline controlled on exam today>>usually 140/97mmHg -Continue prescription drug management with amlodipine 5 mg daily, Tenormin 25 mg twice daily and hydralazine 25 mg twice daily as  needed for BP greater than 160/100  Aortic insufficiency  -mild by echo 2022 -Repeat 2D echo  Mitral regurgitation  -Mild by echo 2022 -Repeat 2D echo  Bilateral carotid artery stenosis -1-39% right ICA stenosis by dopplers 03/2022 -Repeat carotid Dopplers 03/2024 -continue ASA 81mg  daily -She is not on statin due to advanced age   Medication Adjustments/Labs and Tests Ordered: Current medicines are reviewed at length with the patient today.  Concerns regarding medicines are outlined above.  Tests Ordered: Orders Placed This Encounter  Procedures   EKG 12-Lead    Medication Changes: No orders of the defined types were placed in this encounter.    Disposition:  Follow up in 1 year(s)  Signed, Armanda Magic, MD  05/27/2023 9:33 AM    [facility] hey as ago when you

## 2023-05-27 ENCOUNTER — Ambulatory Visit: Payer: Medicare Other | Attending: Cardiology | Admitting: Cardiology

## 2023-05-27 ENCOUNTER — Encounter: Payer: Self-pay | Admitting: Cardiology

## 2023-05-27 VITALS — BP 152/60 | HR 66 | Ht 59.5 in | Wt 148.6 lb

## 2023-05-27 DIAGNOSIS — I10 Essential (primary) hypertension: Secondary | ICD-10-CM | POA: Insufficient documentation

## 2023-05-27 DIAGNOSIS — R079 Chest pain, unspecified: Secondary | ICD-10-CM | POA: Insufficient documentation

## 2023-05-27 DIAGNOSIS — I351 Nonrheumatic aortic (valve) insufficiency: Secondary | ICD-10-CM | POA: Insufficient documentation

## 2023-05-27 DIAGNOSIS — I340 Nonrheumatic mitral (valve) insufficiency: Secondary | ICD-10-CM | POA: Diagnosis not present

## 2023-05-27 DIAGNOSIS — I6523 Occlusion and stenosis of bilateral carotid arteries: Secondary | ICD-10-CM | POA: Insufficient documentation

## 2023-05-27 NOTE — Addendum Note (Signed)
 Addended by: Macie Burows on: 05/27/2023 09:41 AM   Modules accepted: Orders

## 2023-05-27 NOTE — Patient Instructions (Addendum)
 Medication Instructions:  Your physician recommends that you continue on your current medications as directed. Please refer to the Current Medication list given to you today.  *If you need a refill on your cardiac medications before your next appointment, please call your pharmacy*   Lab Work: NONE If you have labs (blood work) drawn today and your tests are completely normal, you will receive your results only by: MyChart Message (if you have MyChart) OR A paper copy in the mail If you have any lab test that is abnormal or we need to change your treatment, we will call you to review the results.   Testing/Procedures: Your physician has requested that you have an echocardiogram. Echocardiography is a painless test that uses sound waves to create images of your heart. It provides your doctor with information about the size and shape of your heart and how well your heart's chambers and valves are working. This procedure takes approximately one hour. There are no restrictions for this procedure. Please do NOT wear cologne, perfume, aftershave, or lotions (deodorant is allowed). Please arrive 15 minutes prior to your appointment time.  Please note: We ask at that you not bring children with you during ultrasound (echo/ vascular) testing. Due to room size and safety concerns, children are not allowed in the ultrasound rooms during exams. Our front office staff cannot provide observation of children in our lobby area while testing is being conducted. An adult accompanying a patient to their appointment will only be allowed in the ultrasound room at the discretion of the ultrasound technician under special circumstances. We apologize for any inconvenience.    Follow-Up: At [facility], you and your health needs are our priority.  As part of our continuing mission to provide you with exceptional heart care, we have created designated Provider Care Teams.  These Care Teams include your  primary Cardiologist (physician) and Advanced Practice Providers (APPs -  Physician Assistants and Nurse Practitioners) who all work together to provide you with the care you need, when you need it.   Your next appointment:   1 year(s)  Provider:   Armanda Magic, MD

## 2023-06-05 DIAGNOSIS — M1712 Unilateral primary osteoarthritis, left knee: Secondary | ICD-10-CM | POA: Diagnosis not present

## 2023-06-05 DIAGNOSIS — Z96651 Presence of right artificial knee joint: Secondary | ICD-10-CM | POA: Diagnosis not present

## 2023-06-22 ENCOUNTER — Other Ambulatory Visit (HOSPITAL_COMMUNITY)

## 2023-07-01 ENCOUNTER — Ambulatory Visit (HOSPITAL_COMMUNITY): Attending: Cardiovascular Disease

## 2023-07-01 ENCOUNTER — Encounter: Payer: Self-pay | Admitting: Cardiology

## 2023-07-01 DIAGNOSIS — I340 Nonrheumatic mitral (valve) insufficiency: Secondary | ICD-10-CM | POA: Diagnosis not present

## 2023-07-01 DIAGNOSIS — I351 Nonrheumatic aortic (valve) insufficiency: Secondary | ICD-10-CM | POA: Insufficient documentation

## 2023-07-01 LAB — ECHOCARDIOGRAM COMPLETE
Area-P 1/2: 3.37 cm2
P 1/2 time: 499 ms
S' Lateral: 2.3 cm

## 2023-07-02 ENCOUNTER — Telehealth: Payer: Self-pay

## 2023-07-02 DIAGNOSIS — I351 Nonrheumatic aortic (valve) insufficiency: Secondary | ICD-10-CM

## 2023-07-02 NOTE — Telephone Encounter (Signed)
 The patient has been notified of the result and verbalized understanding.  All questions (if any) were answered. Erick Alley, RN 07/02/2023 2:46 PM  An echo was ordered for 1 year from today.

## 2023-07-02 NOTE — Telephone Encounter (Signed)
-----   Message from Armanda Magic sent at 07/01/2023  9:20 PM EDT ----- Echo showed normal pumping function of the heart muscle, mildly leaky mitral valve, mild to moderately leaky AV with mild calcification.  Repeat echo in 1 year for AI

## 2023-08-04 DIAGNOSIS — M25512 Pain in left shoulder: Secondary | ICD-10-CM | POA: Diagnosis not present

## 2023-08-04 DIAGNOSIS — M7542 Impingement syndrome of left shoulder: Secondary | ICD-10-CM | POA: Diagnosis not present

## 2023-09-07 ENCOUNTER — Other Ambulatory Visit: Payer: Self-pay | Admitting: Family Medicine

## 2023-09-07 DIAGNOSIS — E559 Vitamin D deficiency, unspecified: Secondary | ICD-10-CM

## 2023-09-07 DIAGNOSIS — N898 Other specified noninflammatory disorders of vagina: Secondary | ICD-10-CM

## 2023-09-08 NOTE — Telephone Encounter (Signed)
 ERx

## 2023-09-09 DIAGNOSIS — M1712 Unilateral primary osteoarthritis, left knee: Secondary | ICD-10-CM | POA: Diagnosis not present

## 2023-11-05 DIAGNOSIS — R300 Dysuria: Secondary | ICD-10-CM | POA: Diagnosis not present

## 2023-11-05 DIAGNOSIS — U071 COVID-19: Secondary | ICD-10-CM | POA: Diagnosis not present

## 2023-11-05 DIAGNOSIS — R350 Frequency of micturition: Secondary | ICD-10-CM | POA: Diagnosis not present

## 2023-11-05 DIAGNOSIS — Z1152 Encounter for screening for COVID-19: Secondary | ICD-10-CM | POA: Diagnosis not present

## 2023-11-05 DIAGNOSIS — I10 Essential (primary) hypertension: Secondary | ICD-10-CM | POA: Diagnosis not present

## 2023-11-05 DIAGNOSIS — N390 Urinary tract infection, site not specified: Secondary | ICD-10-CM | POA: Diagnosis not present

## 2023-11-05 DIAGNOSIS — R051 Acute cough: Secondary | ICD-10-CM | POA: Diagnosis not present

## 2023-12-02 ENCOUNTER — Ambulatory Visit: Payer: Self-pay

## 2023-12-02 NOTE — Telephone Encounter (Signed)
 Called patient reviewed all information and repeated back to me. Will call if any questions.  ? ?

## 2023-12-02 NOTE — Telephone Encounter (Signed)
 FYI Only or Action Required?: Action required by provider: Patient requesting if she can skip the night dose of her BP med.  Patient was last seen in primary care on 02/25/2023 by Rilla Baller, MD.  Called Nurse Triage reporting blood pressure.  Symptoms began about a month ago.  Interventions attempted: Nothing.  Symptoms are: gradually worsening.  Triage Disposition: See PCP When Office is Open (Within 3 Days)  Patient/caregiver understands and will follow disposition?: Yes       Copied from CRM #[id number]. Topic: Clinical - Red Word Triage >> Dec 02, 2023 11:01 AM Priscilla [name] wrote: [facility] that prompted transfer to Nurse Triage: Fluctuation in blood pressure. Patient questioning she if she not take her atenolol  (TENORMIN ) 25 MG tablet in the afternoon due to the change in BP. Patient stated she felt very weak yesterday due to BP being 103/50. Reason for Disposition  [1] Fall in systolic BP > 20 mm Hg from normal AND [2] NOT feeling weak or lightheaded  Answer Assessment - Initial Assessment Questions Patient unsure if medication needs to be adjusted. Last night patient felt weak and states she could not be on her feet for long. Currently patient denies symptoms. Fluctuations with BP in the last 3-4 weeks. She states her systolic is in the 90s at times. Current BP 140/51. Patient denies weakness, chest pain, dizziness, or shortness of breath. Patient wanting to know if she can skip her night dose of BP med. Requesting call back regarding request.   1. BLOOD PRESSURE: What is your blood pressure? Did you take at least two measurements 5 minutes apart?     140/51 this AM , last night  103/50 2. ONSET: When did you take your blood pressure?     This morning 0800 3. HOW: How did you take your blood pressure? ([name].g., visiting nurse, automatic home BP monitor)     Automatic home BP monitor  4. HISTORY: Do you have a history of low blood pressure? What is your blood  pressure normally?     Ongoing for awhile  5. MEDICINES: Are you taking any medicines for blood pressure? If Yes, ask: Have they been changed recently?     Last dose 0800 this morning.  6. PULSE RATE: Do you know what your pulse rate is?      Denies fast heart rate   7. OTHER SYMPTOMS: Have you been sick recently? Have you had a recent injury?     States she had Covid and a UTI in mid August when symptoms started  Protocols used: Blood Pressure - Low-A-AH

## 2023-12-02 NOTE — Telephone Encounter (Signed)
 Reasonable to hold PM atenolol  dose. Recommend check BP in am and in pm over next few days and call us  with readings in setting of holding PM atenolol  dose  Let us  know sooner if these changes don't improve symptoms. Encourage good water  intake to stay well hydrated.

## 2023-12-08 ENCOUNTER — Encounter: Payer: Self-pay | Admitting: Family Medicine

## 2023-12-08 ENCOUNTER — Ambulatory Visit (INDEPENDENT_AMBULATORY_CARE_PROVIDER_SITE_OTHER): Admitting: Family Medicine

## 2023-12-08 VITALS — BP 158/60 | HR 69 | Temp 98.2°F | Ht 59.5 in | Wt 148.2 lb

## 2023-12-08 DIAGNOSIS — R351 Nocturia: Secondary | ICD-10-CM | POA: Diagnosis not present

## 2023-12-08 DIAGNOSIS — Z23 Encounter for immunization: Secondary | ICD-10-CM | POA: Diagnosis not present

## 2023-12-08 DIAGNOSIS — R8290 Unspecified abnormal findings in urine: Secondary | ICD-10-CM

## 2023-12-08 DIAGNOSIS — I10 Essential (primary) hypertension: Secondary | ICD-10-CM | POA: Diagnosis not present

## 2023-12-08 DIAGNOSIS — N644 Mastodynia: Secondary | ICD-10-CM | POA: Diagnosis not present

## 2023-12-08 LAB — POC URINALSYSI DIPSTICK (AUTOMATED)
Glucose, UA: NEGATIVE
Ketones, UA: NEGATIVE
Nitrite, UA: NEGATIVE
Protein, UA: POSITIVE — AB
Spec Grav, UA: 1.015 (ref 1.010–1.025)
Urobilinogen, UA: 0.2 U/dL
pH, UA: 6 (ref 5.0–8.0)

## 2023-12-08 MED ORDER — OXYBUTYNIN CHLORIDE 5 MG PO TABS: 5.0000 mg | ORAL_TABLET | Freq: Every evening | ORAL | 1 refills | Status: DC | PRN

## 2023-12-08 NOTE — Assessment & Plan Note (Addendum)
 Pain present over last several days to R lower breast, and she possibly felt mass when doing self breast exam at home. No obvious significant mass on palpation - however will order dx mammo/US  at [facility] for further evaluation.

## 2023-12-08 NOTE — Assessment & Plan Note (Addendum)
 Anticipate component of white coat hypertension as all home BP readings well controlled on current regimen of amlodipine  and once daily atenolol   Will use home readings to guide treatment. Continue this. No recent need for hydralazine .

## 2023-12-08 NOTE — Progress Notes (Signed)
 Ph: [phone] Fax: [phone]   Patient ID: Priscilla [name], female    DOB: [birth year], 89 y.o.   MRN: [id number]  This visit was conducted in person.  BP (!) 158/60   Pulse 69   Temp 98.2 F (36.8 C) (Oral)   Ht 4' 11.5 (1.511 m)   Wt 148 lb 4 oz (67.2 kg)   SpO2 96%   BMI 29.44 kg/m   BP Readings from Last 3 Encounters:  12/08/23 (!) 158/60  05/27/23 (!) 152/60  02/26/23 (!) 180/70  162/58 on recheck  CC: check low blood pressures Subjective:   HPI: Priscilla [name] is a 89 y.o. female presenting on 12/08/2023 for Medical Management of Chronic Issues (Pt here for Low BP)   See recent phone note.  HTN - Compliant with current antihypertensive regimen of atenolol  25mg  (see below), amlodipine  5mg  daily, hydralazine  25mg  BID PRN BP >160/100. Has not taken any hydralazine  in a long time. Does check blood pressures at home: see below. Notes low blood pressure readings to 103/50 last week, associated with dizziness. Denies HA, vision changes, CP/tightness, leg swelling. Notes exertional dyspnea - chronic.   Wed  140/51, 147/53 Thur 116/47, 130/48 Fri  123/48 Sat  120/51 Sunday 126/56, 137/56 Mon  113/49, 130/52 Today - didn't check  Home BP cuff has previously been checked with ours.  Due to low BP readings, I asked her to drop atenolol  to AM dosing only.   She did have COVID and UTI mid August when symptoms started - did not feel well. She was treated with IM rocephin  followed by macrobid  7d course oral antibiotic. Notes ongoing nocturia with occ accidents, no daytime symptoms. Doesn't drink at night, limits caffeine.   Daughter is being treated with Solifenacin 5mg  with marked benefit.   [facility] 20[facility].  Last week may have felt mass to right breast central below nipple.      Relevant past medical, surgical, family and social history reviewed and updated as indicated. Interim medical history since our last visit reviewed. Allergies and  medications reviewed and updated. Outpatient Medications Prior to Visit  Medication Sig Dispense Refill   acetaminophen  (TYLENOL ) 500 MG tablet Take 500-1,000 mg by mouth every 6 (six) hours as needed for moderate pain or headache.     amLODipine  (NORVASC ) 5 MG tablet Take 1 tablet (5 mg total) by mouth daily. 90 tablet 4   aspirin  EC 81 MG tablet Take 81 mg by mouth daily.     atenolol  (TENORMIN ) 25 MG tablet Take 1 tablet (25 mg total) by mouth 2 (two) times daily. (Patient taking differently: Take 25 mg by mouth daily.) 180 tablet 4   Calcium  Carbonate (CALCIUM  600 PO) Take 1 tablet by mouth at bedtime.     Cholecalciferol (D3-50) 1.25 MG (50000 UT) capsule TAKE 1 CAPSULE BY MOUTH ONE TIME PER WEEK (Patient taking differently: 1 capsule by mouth every other week) 12 capsule 1   Cyanocobalamin  (B-12) 1000 MCG SUBL Place 1 tablet under the tongue every Monday, Wednesday, and Friday.     ferrous sulfate  325 (65 FE) MG tablet Take 1 tablet (325 mg total) by mouth every other day.     hydrALAZINE  (APRESOLINE ) 25 MG tablet TAKE 1 TABLET (25 MG TOTAL) BY MOUTH 2 (TWO) TIMES DAILY AS NEEDED ((BP>160/100)). 45 tablet 3   meloxicam (MOBIC) 7.5 MG tablet Take 7.5 mg by mouth daily as needed for pain.     nitroGLYCERIN  (NITROSTAT ) 0.4 MG  SL tablet Place 1 tablet (0.4 mg total) under the tongue every 5 (five) minutes as needed for chest pain. 25 tablet 1   omeprazole  (PRILOSEC) 20 MG capsule Take 1 capsule (20 mg total) by mouth daily. 90 capsule 4   triamcinolone  cream (KENALOG ) 0.1 % APPLY TO AFFECTED AREA TWICE A DAY 30 g 0   No facility-administered medications prior to visit.     Per HPI unless specifically indicated in ROS section below Review of Systems  Objective:  BP (!) 158/60   Pulse 69   Temp 98.2 F (36.8 C) (Oral)   Ht 4' 11.5 (1.511 m)   Wt 148 lb 4 oz (67.2 kg)   SpO2 96%   BMI 29.44 kg/m   Wt Readings from Last 3 Encounters:  12/08/23 148 lb 4 oz (67.2 kg)  05/27/23 148 lb  9.6 oz (67.4 kg)  02/25/23 145 lb 2 oz (65.8 kg)      Physical Exam Vitals and nursing note reviewed. Exam conducted with a chaperone present.  Constitutional:      Appearance: Normal appearance. She is not ill-appearing.  HENT:     Mouth/Throat:     Mouth: Mucous membranes are moist.     Pharynx: Oropharynx is clear. No oropharyngeal exudate or posterior oropharyngeal erythema.  Eyes:     Extraocular Movements: Extraocular movements intact.     Conjunctiva/sclera: Conjunctivae normal.     Pupils: Pupils are equal, round, and reactive to light.  Neck:     Thyroid : No thyroid  mass or thyromegaly.  Cardiovascular:     Rate and Rhythm: Normal rate and regular rhythm.     Pulses: Normal pulses.     Heart sounds: Normal heart sounds. No murmur heard. Pulmonary:     Effort: Pulmonary effort is normal. No respiratory distress.     Breath sounds: Normal breath sounds. No wheezing, rhonchi or rales.  Chest:     Chest wall: No mass.  Breasts:    Breasts are symmetrical.     Right: Tenderness present. No swelling, bleeding, inverted nipple, mass, nipple discharge or skin change.     Left: Normal. No swelling, bleeding, inverted nipple, mass, nipple discharge, skin change or tenderness.       Comments: Tender to palpation R inferior breast Musculoskeletal:     Right lower leg: No edema.     Left lower leg: No edema.  Lymphadenopathy:     Upper Body:     Right upper body: No supraclavicular, axillary or pectoral adenopathy.     Left upper body: No supraclavicular, axillary or pectoral adenopathy.  Skin:    General: Skin is warm and dry.     Findings: No rash.  Neurological:     Mental Status: She is alert.  Psychiatric:        Mood and Affect: Mood normal.        Behavior: Behavior normal.       Results for orders placed or performed in visit on 12/08/23  POCT Urinalysis Dipstick (Automated)   Collection Time: 12/08/23  9:12 AM  Result Value Ref Range   Color, UA yellow     Clarity, UA Clear    Glucose, UA Negative Negative   Bilirubin, UA 1+    Ketones, UA Neg    Spec Grav, UA 1.015 1.010 - 1.025   Blood, UA trace    pH, UA 6.0 5.0 - 8.0   Protein, UA Positive (A) Negative   Urobilinogen, UA 0.2 0.2 or  1.0 E.U./dL   Nitrite, UA neg    Leukocytes, UA Large (3+) (A) Negative  Quantity insufficient for microscopy - UCx sent.   Assessment & Plan:   Problem List Items Addressed This Visit     White coat syndrome with hypertension - Primary   Anticipate component of white coat hypertension as all home BP readings well controlled on current regimen of amlodipine  and once daily atenolol   Will use home readings to guide treatment. Continue this. No recent need for hydralazine .       Breast pain, right   Pain present over last several days to R lower breast, and she possibly felt mass when doing self breast exam at home. No obvious significant mass on palpation - however will order dx mammo/US  at [facility] for further evaluation.       Relevant Orders   MM 3D DIAGNOSTIC MAMMOGRAM BILATERAL BREAST   US  BREAST COMPLETE UNI RIGHT INC AXILLA   Nocturia   Anticipate component of overactive bladder worse at night. Avoid myrbetriq  due to possible BP elevation. Trial oxybutynin  5mg  nightly - reviewed possible side effects to watch for including dry mouth, urinary retention, and possible effects on cognition.  UA today - 3+ LE, insufficient to spin. UCx sent.       Relevant Orders   POCT Urinalysis Dipstick (Automated) (Completed)   Urine Culture   Other Visit Diagnoses       Encounter for immunization       Relevant Orders   Flu vaccine HIGH DOSE PF(Fluzone Trivalent) (Completed)     Abnormal urinalysis       Relevant Orders   Urine Culture        Meds ordered this encounter  Medications   oxybutynin  (DITROPAN ) 5 MG tablet    Sig: Take 1 tablet (5 mg total) by mouth at bedtime as needed for bladder spasms.    Dispense:  30 tablet    Refill:  1     Orders Placed This Encounter  Procedures   Urine Culture   MM 3D DIAGNOSTIC MAMMOGRAM BILATERAL BREAST    Standing Status:   Future    Expiration Date:   12/07/2024    Reason for Exam (SYMPTOM  OR DIAGNOSIS REQUIRED):   R inferior breast pain and possible mass 6 o clock    Preferred imaging location?:   External   US  BREAST COMPLETE UNI RIGHT INC AXILLA    Standing Status:   Future    Expiration Date:   12/07/2024    Reason for Exam (SYMPTOM  OR DIAGNOSIS REQUIRED):   R inferior breast pain and possible mass ~6 o clock    Preferred imaging location?:   External   Flu vaccine HIGH DOSE PF(Fluzone Trivalent)   POCT Urinalysis Dipstick (Automated)    Patient Instructions  Flu shot today  Urine test today  May try oxybutynin  5mg  at night time for urinary symptoms. Update us  with effect.  Stay on current regimen of atenolol  and amlodipine  just in the mornings.  I have ordered detailed mammogram and possible right ultrasound to [facility] Good to see you today Return for previously scheduled physical, sooner if needed  Follow up plan: Return if symptoms worsen or fail to improve.  Anton Blas, MD

## 2023-12-08 NOTE — Patient Instructions (Addendum)
 Flu shot today  Urine test today  May try oxybutynin  5mg  at night time for urinary symptoms. Update us  with effect.  Stay on current regimen of atenolol  and amlodipine  just in the mornings.  I have ordered detailed mammogram and possible right ultrasound to [facility] Good to see you today Return for previously scheduled physical, sooner if needed

## 2023-12-08 NOTE — Assessment & Plan Note (Addendum)
 Anticipate component of overactive bladder worse at night. Avoid myrbetriq  due to possible BP elevation. Trial oxybutynin  5mg  nightly - reviewed possible side effects to watch for including dry mouth, urinary retention, and possible effects on cognition.  UA today - 3+ LE, insufficient to spin. UCx sent.

## 2023-12-10 ENCOUNTER — Ambulatory Visit: Payer: Self-pay | Admitting: Family Medicine

## 2023-12-10 LAB — URINE CULTURE
MICRO NUMBER:: 16974117
SPECIMEN QUALITY:: ADEQUATE

## 2023-12-10 MED ORDER — SULFAMETHOXAZOLE-TRIMETHOPRIM 800-160 MG PO TABS: 1.0000 | ORAL_TABLET | Freq: Two times a day (BID) | ORAL | 0 refills | Status: DC

## 2023-12-22 DIAGNOSIS — R928 Other abnormal and inconclusive findings on diagnostic imaging of breast: Secondary | ICD-10-CM | POA: Diagnosis not present

## 2023-12-22 DIAGNOSIS — R922 Inconclusive mammogram: Secondary | ICD-10-CM | POA: Diagnosis not present

## 2023-12-22 LAB — HM MAMMOGRAPHY

## 2023-12-25 ENCOUNTER — Ambulatory Visit: Payer: Self-pay | Admitting: Family Medicine

## 2023-12-31 ENCOUNTER — Other Ambulatory Visit: Payer: Self-pay | Admitting: Family Medicine

## 2023-12-31 NOTE — Telephone Encounter (Signed)
 Requesting 90 day supply ok to refill?

## 2024-02-15 DIAGNOSIS — M1712 Unilateral primary osteoarthritis, left knee: Secondary | ICD-10-CM | POA: Diagnosis not present

## 2024-02-23 ENCOUNTER — Encounter

## 2024-03-21 ENCOUNTER — Other Ambulatory Visit: Payer: Self-pay | Admitting: Family Medicine

## 2024-03-21 DIAGNOSIS — I10 Essential (primary) hypertension: Secondary | ICD-10-CM

## 2024-04-11 ENCOUNTER — Emergency Department (HOSPITAL_BASED_OUTPATIENT_CLINIC_OR_DEPARTMENT_OTHER): Admitting: Radiology

## 2024-04-11 ENCOUNTER — Encounter (HOSPITAL_BASED_OUTPATIENT_CLINIC_OR_DEPARTMENT_OTHER): Payer: Self-pay | Admitting: *Deleted

## 2024-04-11 ENCOUNTER — Emergency Department (HOSPITAL_BASED_OUTPATIENT_CLINIC_OR_DEPARTMENT_OTHER)

## 2024-04-11 ENCOUNTER — Ambulatory Visit: Payer: Self-pay

## 2024-04-11 ENCOUNTER — Inpatient Hospital Stay (HOSPITAL_BASED_OUTPATIENT_CLINIC_OR_DEPARTMENT_OTHER)
Admission: EM | Admit: 2024-04-11 | Discharge: 2024-04-14 | DRG: 291 | Disposition: A | Discharge status: Home / Self Care | Attending: Internal Medicine | Admitting: Internal Medicine

## 2024-04-11 ENCOUNTER — Other Ambulatory Visit: Payer: Self-pay

## 2024-04-11 DIAGNOSIS — I700 Atherosclerosis of aorta: Secondary | ICD-10-CM | POA: Diagnosis present

## 2024-04-11 DIAGNOSIS — Z96611 Presence of right artificial shoulder joint: Secondary | ICD-10-CM | POA: Diagnosis present

## 2024-04-11 DIAGNOSIS — E8770 Fluid overload, unspecified: Secondary | ICD-10-CM | POA: Diagnosis present

## 2024-04-11 DIAGNOSIS — Z8616 Personal history of COVID-19: Secondary | ICD-10-CM

## 2024-04-11 DIAGNOSIS — Z8269 Family history of other diseases of the musculoskeletal system and connective tissue: Secondary | ICD-10-CM

## 2024-04-11 DIAGNOSIS — Z1152 Encounter for screening for COVID-19: Secondary | ICD-10-CM

## 2024-04-11 DIAGNOSIS — M1990 Unspecified osteoarthritis, unspecified site: Secondary | ICD-10-CM | POA: Diagnosis present

## 2024-04-11 DIAGNOSIS — J069 Acute upper respiratory infection, unspecified: Secondary | ICD-10-CM

## 2024-04-11 DIAGNOSIS — I440 Atrioventricular block, first degree: Secondary | ICD-10-CM | POA: Diagnosis present

## 2024-04-11 DIAGNOSIS — Z85038 Personal history of other malignant neoplasm of large intestine: Secondary | ICD-10-CM | POA: Diagnosis present

## 2024-04-11 DIAGNOSIS — I10 Essential (primary) hypertension: Secondary | ICD-10-CM

## 2024-04-11 DIAGNOSIS — I509 Heart failure, unspecified: Principal | ICD-10-CM | POA: Insufficient documentation

## 2024-04-11 DIAGNOSIS — Z85048 Personal history of other malignant neoplasm of rectum, rectosigmoid junction, and anus: Secondary | ICD-10-CM

## 2024-04-11 DIAGNOSIS — Z79899 Other long term (current) drug therapy: Secondary | ICD-10-CM

## 2024-04-11 DIAGNOSIS — K219 Gastro-esophageal reflux disease without esophagitis: Secondary | ICD-10-CM | POA: Diagnosis present

## 2024-04-11 DIAGNOSIS — I352 Nonrheumatic aortic (valve) stenosis with insufficiency: Secondary | ICD-10-CM | POA: Diagnosis present

## 2024-04-11 DIAGNOSIS — I351 Nonrheumatic aortic (valve) insufficiency: Secondary | ICD-10-CM | POA: Diagnosis present

## 2024-04-11 DIAGNOSIS — Z800 Family history of malignant neoplasm of digestive organs: Secondary | ICD-10-CM

## 2024-04-11 DIAGNOSIS — Z8249 Family history of ischemic heart disease and other diseases of the circulatory system: Secondary | ICD-10-CM

## 2024-04-11 DIAGNOSIS — Z7982 Long term (current) use of aspirin: Secondary | ICD-10-CM

## 2024-04-11 DIAGNOSIS — D649 Anemia, unspecified: Secondary | ICD-10-CM | POA: Diagnosis present

## 2024-04-11 DIAGNOSIS — Z90710 Acquired absence of both cervix and uterus: Secondary | ICD-10-CM

## 2024-04-11 DIAGNOSIS — R0609 Other forms of dyspnea: Secondary | ICD-10-CM | POA: Diagnosis present

## 2024-04-11 DIAGNOSIS — I110 Hypertensive heart disease with heart failure: Principal | ICD-10-CM | POA: Diagnosis present

## 2024-04-11 DIAGNOSIS — R059 Cough, unspecified: Secondary | ICD-10-CM | POA: Diagnosis present

## 2024-04-11 DIAGNOSIS — Z96651 Presence of right artificial knee joint: Secondary | ICD-10-CM | POA: Diagnosis present

## 2024-04-11 DIAGNOSIS — Z86718 Personal history of other venous thrombosis and embolism: Secondary | ICD-10-CM

## 2024-04-11 DIAGNOSIS — I2510 Atherosclerotic heart disease of native coronary artery without angina pectoris: Secondary | ICD-10-CM | POA: Diagnosis present

## 2024-04-11 DIAGNOSIS — I5033 Acute on chronic diastolic (congestive) heart failure: Secondary | ICD-10-CM | POA: Diagnosis present

## 2024-04-11 DIAGNOSIS — Z9049 Acquired absence of other specified parts of digestive tract: Secondary | ICD-10-CM

## 2024-04-11 LAB — HEPATIC FUNCTION PANEL
ALT: 14 U/L (ref 0–44)
AST: 19 U/L (ref 15–41)
Albumin: 3.8 g/dL (ref 3.5–5.0)
Alkaline Phosphatase: 102 U/L (ref 38–126)
Bilirubin, Direct: 0.1 mg/dL (ref 0.0–0.2)
Indirect Bilirubin: 0.2 mg/dL — ABNORMAL LOW (ref 0.3–0.9)
Total Bilirubin: 0.4 mg/dL (ref 0.0–1.2)
Total Protein: 6.7 g/dL (ref 6.5–8.1)

## 2024-04-11 LAB — BASIC METABOLIC PANEL WITH GFR
Anion gap: 12 (ref 5–15)
BUN: 14 mg/dL (ref 8–23)
CO2: 24 mmol/L (ref 22–32)
Calcium: 9.9 mg/dL (ref 8.9–10.3)
Chloride: 104 mmol/L (ref 98–111)
Creatinine, Ser: 0.75 mg/dL (ref 0.44–1.00)
GFR, Estimated: >60 mL/min
Glucose, Bld: 103 mg/dL — ABNORMAL HIGH (ref 70–99)
Potassium: 4.5 mmol/L (ref 3.5–5.1)
Sodium: 140 mmol/L (ref 135–145)

## 2024-04-11 LAB — CBC
HCT: 32.2 % — ABNORMAL LOW (ref 36.0–46.0)
Hemoglobin: 10.6 g/dL — ABNORMAL LOW (ref 12.0–15.0)
MCH: 29.3 pg (ref 26.0–34.0)
MCHC: 32.9 g/dL (ref 30.0–36.0)
MCV: 89 fL (ref 80.0–100.0)
Platelets: 519 K/uL — ABNORMAL HIGH (ref 150–400)
RBC: 3.62 MIL/uL — ABNORMAL LOW (ref 3.87–5.11)
RDW: 12.7 % (ref 11.5–15.5)
WBC: 7.2 K/uL (ref 4.0–10.5)
nRBC: 0 % (ref 0.0–0.2)

## 2024-04-11 LAB — URINALYSIS, W/ REFLEX TO CULTURE (INFECTION SUSPECTED)
Bacteria, UA: NONE SEEN
Bilirubin Urine: NEGATIVE
Glucose, UA: NEGATIVE mg/dL
Hgb urine dipstick: NEGATIVE
Ketones, ur: NEGATIVE mg/dL
Nitrite: NEGATIVE
Protein, ur: NEGATIVE mg/dL
Specific Gravity, Urine: <1.005 — ABNORMAL LOW (ref 1.005–1.030)
pH: 7.5 (ref 5.0–8.0)

## 2024-04-11 LAB — D-DIMER, QUANTITATIVE: D-Dimer, Quant: 1.39 ug{FEU}/mL — ABNORMAL HIGH (ref 0.00–0.50)

## 2024-04-11 LAB — RESP PANEL BY RT-PCR (RSV, FLU A&B, COVID)  RVPGX2
Influenza A by PCR: NEGATIVE
Influenza B by PCR: NEGATIVE
Resp Syncytial Virus by PCR: NEGATIVE
SARS Coronavirus 2 by RT PCR: NEGATIVE

## 2024-04-11 LAB — TROPONIN T, HIGH SENSITIVITY
Troponin T High Sensitivity: 20 ng/L — ABNORMAL HIGH (ref 0–19)
Troponin T High Sensitivity: 20 ng/L — ABNORMAL HIGH (ref 0–19)

## 2024-04-11 LAB — PRO BRAIN NATRIURETIC PEPTIDE: Pro Brain Natriuretic Peptide: 2794 pg/mL — ABNORMAL HIGH

## 2024-04-11 MED ORDER — FUROSEMIDE 10 MG/ML IJ SOLN
20.0000 mg | Freq: Once | INTRAMUSCULAR | Status: DC
  Filled 2024-04-11: qty 2

## 2024-04-11 MED ORDER — FUROSEMIDE 10 MG/ML IJ SOLN
40.0000 mg | Freq: Once | INTRAMUSCULAR | Status: AC
  Administered 2024-04-11: 40 mg via INTRAVENOUS
  Filled 2024-04-11: qty 4

## 2024-04-11 MED ORDER — IOHEXOL 350 MG/ML SOLN
75.0000 mL | Freq: Once | INTRAVENOUS | Status: AC | PRN
  Administered 2024-04-11: 100 mL via INTRAVENOUS

## 2024-04-11 NOTE — Telephone Encounter (Signed)
 FYI Only or Action Required?: FYI only for provider: ED advised.  Patient was last seen in primary care on 12/08/2023 by Rilla Baller, MD.  Called Nurse Triage reporting Cough.  Symptoms began 2 weeks ago.  Interventions attempted: Nothing.  Symptoms are: gradually worsening.  Triage Disposition: Go to ED Now (Notify PCP), Go to ED Now (or PCP Triage)  Patient/caregiver understands and will follow disposition?: Yes     Reason for Triage: Coughing, chest pain. - worsening symptoms.   Reason for Disposition  [1] Chest pain lasts > 5 minutes AND [2] occurred in past 3 days (72 hours) (Exception: Feels exactly the same as previously diagnosed heartburn and has accompanying sour taste in mouth.)  Nursing judgment  Triager needs further essential information from caller in order to complete triage  Answer Assessment - Initial Assessment Questions Advised ED. Daughter will call back if pt refuses.    1. REASON FOR CALL: What is the main reason for your call? or How can I best help you?     Daughter not with pt at this time. States has had cough for 2 weeks. Currently on antibiotics. Last night had an episode of CP that was relieved with 2 ntg 5 min apart. She denied CP this am. Daughter states pt is SOB due to cough.  Protocols used: Chest Pain-A-[facility], No Guideline or Reference Available-A-[facility], Information Only Call - No Triage-[facility]

## 2024-04-11 NOTE — ED Notes (Signed)
 RT ambulated with pt. Pt's oxygen saturation dropped to 87-88%. Pt appeared SOB. MD and RN made aware.

## 2024-04-11 NOTE — ED Provider Notes (Signed)
 Pt seen by Dr Jerrol.  Please see his note.  Pt diagnosed with flu a couple of weeks ago.  Coughing still.  Now with shortness of breath  Pt here for resp difficulty.  O2 sat does increase with ambulation.  Clinical Course as of 04/11/24 1717  Mon Apr 11, 2024  1625 Pro Brain natriuretic peptide(!) BNP elevated [JK]  1625 Troponin T, High Sensitivity(!) Troponin elevated [JK]  1625 Hepatic function panel(!) Normal [JK]  1625 Urinalysis, w/ Reflex to Culture (Infection Suspected) -Urine, Clean Catch(!) Normal [JK]  1626 CBC(!) Hemoglobin decreased compared to previous [JK]  1626 US  ABDOMEN LIMITED RUQ (LIVER/GB) No acute findings [JK]  1626 DG Chest 2 View Pulmonary edema noted [JK]  1716 CT without PE [JK]    Clinical Course User Index [JK] Randol Simmonds, MD   ED workup suggests ChF exacerbation.  May still be having some residual cough sx fom the flu.  O2 decreases with ambulation.  Will start diuretic.  Consult for transfer and admission  Case discussed with Dr Vernon Randol Simmonds, MD 04/11/24 1750

## 2024-04-11 NOTE — ED Triage Notes (Signed)
 Pt was dx with flu and UTI 2 weeks ago (she was given abt for UTI and tamiflu).  Pt then went back for re-evaluation a week ago bc she continued to feel unwell.  Pt was dx with sinuis infection at that time and given abt.  Pt continues to have cough and flu like symptoms with weakness and body aches.  Pt states that she has gotten to where it is difficult for her to walk.  She also noted CP yesterday and took nitroglycerin  with some relief.  Pt has had sob with this.

## 2024-04-11 NOTE — ED Provider Notes (Signed)
 " [facility] AT [facility] Provider Note   CSN: [id number] Arrival date & time: 04/11/24  1119     Patient presents with: Shortness of Breath   Priscilla [name] is a 89 y.o. female.    Shortness of Breath    89 year old female with medical history significant for DVT not on anticoagulation, C. difficile, small bowel obstruction, aortic insufficiency, carotid stenosis, recent diagnosis of influenza and UTI 2 weeks ago (given antibiotics and Tamiflu which then subsequently resulted in a diarrheal illness) who presents to the emergency department with a chief complaint of fatigue, productive cough and flulike symptoms.  Also went back for reevaluation 1 week ago and was diagnosed with a sinus infection and given antibiotics.  She is primarily concerned for persistent cough that has been ongoing for the past few weeks, productive of yellow sputum.  Last night she woke up and she had an episode of right-sided chest discomfort radiating to her back, alleviated with 2 sublingual nitroglycerin  back-to-back over 5 minutes.  No nausea or vomiting, does endorse persistent shortness of breath.  She is not on anticoagulation.  Prior to Admission medications  Medication Sig Start Date End Date Taking? Authorizing Provider  acetaminophen  (TYLENOL ) 500 MG tablet Take 500-1,000 mg by mouth every 6 (six) hours as needed for moderate pain or headache.    [provider]  amLODipine  (NORVASC ) 5 MG tablet TAKE 1 TABLET (5 MG TOTAL) BY MOUTH DAILY. 03/21/24   Rilla Baller, MD  aspirin  EC 81 MG tablet Take 81 mg by mouth daily.    [provider]  atenolol  (TENORMIN ) 25 MG tablet Take 1 tablet (25 mg total) by mouth 2 (two) times daily. Patient taking differently: Take 25 mg by mouth daily. 02/25/23   Gutierrez, Javier, MD  Calcium  Carbonate (CALCIUM  600 PO) Take 1 tablet by mouth at bedtime.    [provider]  Cholecalciferol (D3-50) 1.25 MG (50000  UT) capsule TAKE 1 CAPSULE BY MOUTH ONE TIME PER WEEK Patient taking differently: 1 capsule by mouth every other week 09/08/23   Rilla Baller, MD  Cyanocobalamin  (B-12) 1000 MCG SUBL Place 1 tablet under the tongue every Monday, Wednesday, and Friday. 08/21/21   Rilla Baller, MD  ferrous sulfate  325 (65 FE) MG tablet Take 1 tablet (325 mg total) by mouth every other day. 02/12/21   Rilla Baller, MD  hydrALAZINE  (APRESOLINE ) 25 MG tablet TAKE 1 TABLET (25 MG TOTAL) BY MOUTH 2 (TWO) TIMES DAILY AS NEEDED ((BP>160/100)). 01/08/21   Shlomo Wilbert SAUNDERS, MD  meloxicam (MOBIC) 7.5 MG tablet Take 7.5 mg by mouth daily as needed for pain. 05/06/23   [provider]  nitroGLYCERIN  (NITROSTAT ) 0.4 MG SL tablet Place 1 tablet (0.4 mg total) under the tongue every 5 (five) minutes as needed for chest pain. 11/28/20   Rilla Baller, MD  omeprazole  (PRILOSEC) 20 MG capsule TAKE 1 CAPSULE BY MOUTH EVERY DAY 03/21/24   Rilla Baller, MD  oxybutynin  (DITROPAN ) 5 MG tablet TAKE 1 TABLET (5 MG TOTAL) BY MOUTH AT BEDTIME AS NEEDED FOR BLADDER SPASMS. 01/04/24   Rilla Baller, MD  sulfamethoxazole -trimethoprim  (BACTRIM  DS) 800-160 MG tablet Take 1 tablet by mouth 2 (two) times daily. 12/10/23   Rilla Baller, MD  triamcinolone  cream (KENALOG ) 0.1 % APPLY TO AFFECTED AREA TWICE A DAY 09/08/23   Rilla Baller, MD  CALCIUM -VITAMIN D  PO Take 1 tablet by mouth 2 (two) times daily.  12/23/18  [provider]    Allergies:  Morphine and codeine, Lisinopril, and Losartan     Review of Systems  Respiratory:  Positive for shortness of breath.   All other systems reviewed and are negative.   Updated Vital Signs BP (!) 159/103 (BP Location: Left Arm)   Pulse 79   Temp 98.1 F (36.7 C) (Oral)   Resp 16   SpO2 95%   Physical Exam Vitals and nursing note reviewed.  Constitutional:      General: She is not in acute distress.    Appearance: She is well-developed.  HENT:     Head:  Normocephalic and atraumatic.  Eyes:     Conjunctiva/sclera: Conjunctivae normal.  Cardiovascular:     Rate and Rhythm: Normal rate and regular rhythm.     Heart sounds: No murmur heard. Pulmonary:     Effort: Pulmonary effort is normal. No respiratory distress.     Breath sounds: Normal breath sounds.  Abdominal:     Palpations: Abdomen is soft.     Tenderness: There is abdominal tenderness in the right upper quadrant. There is no guarding.  Musculoskeletal:        General: No swelling.     Cervical back: Neck supple.  Skin:    General: Skin is warm and dry.     Capillary Refill: Capillary refill takes less than 2 seconds.  Neurological:     Mental Status: She is alert.  Psychiatric:        Mood and Affect: Mood normal.     (all labs ordered are listed, but only abnormal results are displayed) Labs Reviewed  CBC - Abnormal; Notable for the following components:      Result Value   RBC 3.62 (*)    Hemoglobin 10.6 (*)    HCT 32.2 (*)    Platelets 519 (*)    All other components within normal limits  RESP PANEL BY RT-PCR (RSV, FLU A&B, COVID)  [id number]  BASIC METABOLIC PANEL WITH GFR    EKG: EKG Interpretation Date/Time:  Monday April 11 2024 11:34:52 EST Ventricular Rate:  77 PR Interval:  231 QRS Duration:  83 QT Interval:  387 QTC Calculation: 438 R Axis:   15  Text Interpretation: Sinus rhythm Prolonged PR interval Low voltage, precordial leads No significant change since last tracing Confirmed by Jerrol Agent ([id number]) on 04/11/2024 12:44:12 PM  Radiology: No results found.   Procedures   Medications Ordered in the ED - No data to display                                  Medical Decision Making Amount and/or Complexity of Data Reviewed Labs: ordered. Radiology: ordered.    89 year old female with medical history significant for DVT not on anticoagulation, C. difficile, small bowel obstruction, aortic insufficiency, carotid stenosis, recent diagnosis  of influenza and UTI 2 weeks ago (given antibiotics and Tamiflu which then subsequently resulted in a diarrheal illness) who presents to the emergency department with a chief complaint of fatigue, productive cough and flulike symptoms.  Also went back for reevaluation 1 week ago and was diagnosed with a sinus infection and given antibiotics.  She is primarily concerned for persistent cough that has been ongoing for the past few weeks, productive of yellow sputum.  Last night she woke up and she had an episode of right-sided chest discomfort radiating to her back, alleviated with 2 sublingual nitroglycerin  back-to-back over 5 minutes.  No nausea or  vomiting, does endorse persistent shortness of breath.  She is not on anticoagulation.  On arrival, the patient was afebrile, not tachycardic or tachypneic, BP 159/103, saturating 95% on room air.  Patient presenting with productive cough and fatigue for the past few weeks.  Productive of yellow to green sputum.  Had an episode of right sided chest discomfort radiating to her back which alleviated overnight after sublingual nitroglycerin .  No nausea or vomiting, persistent dyspnea.  Lower extremity swelling on exam, lungs clear to auscultation on exam.  The patient was focally tender in the right upper quadrant.  Differential diagnose includes viral infectious etiology, bacterial pneumonia, CHF exacerbation, less likely ACS, PE, considered but feel less likely cholelithiasis/cholecystitis, peptic ulcer disease.  EKG: Sinus rhythm, ventricular rate 77, no acute ischemic changes noted. Labs: CBC without a leukocytosis, mild anemia 10.6, BMP without significant electrolyte abnormality, normal renal function, hepatic function panel normal, urinalysis negative for UTI or hematuria, D-dimer ordered and pending, BNP ordered and pending.  Initial cardiac troponin was 20, repeat cardiac troponin pending, COVID flu and RSV PCR testing was negative.  CXR: IMPRESSION:  1.  Mild pulmonary edema with interstitial prominence.  2. Small bilateral pleural effusions with patchy basilar atelectasis.  3. Mild cardiomegaly.   Plan at signout to ambulate the patient, assess pulse oximetry on ambulation, follow-up on results of BNP, delta troponin and D-dimer, ultrasound results.  Signout given to Dr. Randol at [phone].     Final diagnoses:  None    ED Discharge Orders     None          Jerrol Agent, MD 04/11/24 1512  "

## 2024-04-11 NOTE — Telephone Encounter (Signed)
 Noted hospitalized with CHF exacerbation.

## 2024-04-12 ENCOUNTER — Encounter (HOSPITAL_BASED_OUTPATIENT_CLINIC_OR_DEPARTMENT_OTHER): Payer: Self-pay | Admitting: Family Medicine

## 2024-04-12 DIAGNOSIS — I509 Heart failure, unspecified: Secondary | ICD-10-CM | POA: Diagnosis not present

## 2024-04-12 DIAGNOSIS — K219 Gastro-esophageal reflux disease without esophagitis: Secondary | ICD-10-CM

## 2024-04-12 DIAGNOSIS — E8770 Fluid overload, unspecified: Secondary | ICD-10-CM | POA: Insufficient documentation

## 2024-04-12 MED ORDER — IPRATROPIUM-ALBUTEROL 0.5-2.5 (3) MG/3ML IN SOLN
3.0000 mL | Freq: Four times a day (QID) | RESPIRATORY_TRACT | Status: DC
  Administered 2024-04-12: 3 mL via RESPIRATORY_TRACT

## 2024-04-12 MED ORDER — IPRATROPIUM-ALBUTEROL 0.5-2.5 (3) MG/3ML IN SOLN: 3.0000 mL | RESPIRATORY_TRACT | Status: DC | PRN

## 2024-04-12 MED ORDER — SENNOSIDES-DOCUSATE SODIUM 8.6-50 MG PO TABS: 1.0000 | ORAL_TABLET | Freq: Every evening | ORAL | Status: DC | PRN

## 2024-04-12 MED ORDER — ACETAMINOPHEN 650 MG RE SUPP: 650.0000 mg | Freq: Four times a day (QID) | RECTAL | Status: DC | PRN

## 2024-04-12 MED ORDER — AMLODIPINE BESYLATE 5 MG PO TABS
5.0000 mg | ORAL_TABLET | Freq: Every day | ORAL | Status: DC
  Administered 2024-04-12 – 2024-04-14 (×3): 5 mg via ORAL
  Filled 2024-04-12 (×3): qty 1

## 2024-04-12 MED ORDER — ENOXAPARIN SODIUM 40 MG/0.4ML IJ SOSY
40.0000 mg | PREFILLED_SYRINGE | INTRAMUSCULAR | Status: DC
  Administered 2024-04-12 – 2024-04-13 (×2): 40 mg via SUBCUTANEOUS
  Filled 2024-04-12 (×2): qty 0.4

## 2024-04-12 MED ORDER — ACETAMINOPHEN 325 MG PO TABS: 650.0000 mg | ORAL_TABLET | Freq: Four times a day (QID) | ORAL | Status: DC | PRN

## 2024-04-12 MED ORDER — VITAMIN B-12 1000 MCG PO TABS
1000.0000 ug | ORAL_TABLET | ORAL | Status: DC
  Administered 2024-04-13: 1000 ug via ORAL
  Filled 2024-04-12 (×2): qty 1

## 2024-04-12 MED ORDER — FUROSEMIDE 10 MG/ML IJ SOLN: 20.0000 mg | Freq: Two times a day (BID) | INTRAMUSCULAR | Status: DC

## 2024-04-12 MED ORDER — ONDANSETRON HCL 4 MG/2ML IJ SOLN: 4.0000 mg | Freq: Four times a day (QID) | INTRAMUSCULAR | Status: DC | PRN

## 2024-04-12 MED ORDER — HYDROCOD POLI-CHLORPHE POLI ER 10-8 MG/5ML PO SUER: 5.0000 mL | Freq: Every evening | ORAL | Status: DC | PRN

## 2024-04-12 MED ORDER — ATENOLOL 25 MG PO TABS
25.0000 mg | ORAL_TABLET | Freq: Every day | ORAL | Status: DC
  Administered 2024-04-12 – 2024-04-14 (×3): 25 mg via ORAL
  Filled 2024-04-12 (×3): qty 1

## 2024-04-12 MED ORDER — FUROSEMIDE 10 MG/ML IJ SOLN
20.0000 mg | Freq: Two times a day (BID) | INTRAMUSCULAR | Status: AC
  Administered 2024-04-12 – 2024-04-13 (×2): 20 mg via INTRAVENOUS
  Filled 2024-04-12 (×2): qty 2

## 2024-04-12 MED ORDER — IPRATROPIUM-ALBUTEROL 0.5-2.5 (3) MG/3ML IN SOLN
3.0000 mL | Freq: Four times a day (QID) | RESPIRATORY_TRACT | Status: AC
  Administered 2024-04-12 – 2024-04-13 (×2): 3 mL via RESPIRATORY_TRACT
  Filled 2024-04-12 (×2): qty 3

## 2024-04-12 MED ORDER — GUAIFENESIN-DM 100-10 MG/5ML PO SYRP
5.0000 mL | ORAL_SOLUTION | ORAL | Status: DC | PRN
  Administered 2024-04-13: 5 mL via ORAL
  Filled 2024-04-12: qty 5

## 2024-04-12 MED ORDER — ONDANSETRON HCL 4 MG PO TABS: 4.0000 mg | ORAL_TABLET | Freq: Four times a day (QID) | ORAL | Status: DC | PRN

## 2024-04-12 MED ORDER — FERROUS SULFATE 325 (65 FE) MG PO TABS
325.0000 mg | ORAL_TABLET | ORAL | Status: DC
  Administered 2024-04-13: 325 mg via ORAL
  Filled 2024-04-12: qty 1

## 2024-04-12 MED ORDER — PANTOPRAZOLE SODIUM 40 MG PO TBEC
40.0000 mg | DELAYED_RELEASE_TABLET | Freq: Every day | ORAL | Status: DC
  Administered 2024-04-13 – 2024-04-14 (×2): 40 mg via ORAL
  Filled 2024-04-12 (×2): qty 1

## 2024-04-12 MED ORDER — B-12 1000 MCG SL SUBL: 1.0000 | SUBLINGUAL_TABLET | SUBLINGUAL | Status: DC

## 2024-04-12 NOTE — H&P (Signed)
 " History and Physical - Telemedicine  SHASHA BUCHBINDER FMW:[id number] DOB: 1930-11-12 DOA: 04/11/2024  PCP: Rilla Baller, MD Patient coming from: Home  Referring provider: Dr. Randol, EDP Telemedicine provider: Dr. Sherre Patient location: [facility] Referring diagnosis: Heart failure exacerbation, fluid overload Patient name and DOB verified: Patient was able to verify her first and last name: Priscilla [name], date of birth: 11-Jul-1930. Patient consented to Telemedicine Evaluation: Yes RN virtual assistant: Carlin Salt, RN Video encounter time and date: 04/12/24 and at approximately: 11:00am  Chief Concern: Shortness of breath  HPI: Priscilla [name] is a 89 year old female with history of hypertension, history of C. difficile, history of aortic stenosis, colon surgery, recent UTI, history of DVT in 2002 postsurgery, completed 6 months of warfarin, currently not on anticoagulation.  04/11/2024: She presents to the ED for chief concerns of shortness of breath for 2 weeks, cough that is productive.  Vitals at the time of my evaluation showed temperature 98.5, respiration 24, heart rate 124, blood pressure 153/61, SpO2 96% on room air.  Serum sodium is 140, potassium 4.5, chloride 104, bicarb 24, BUN of 14, serum creatinine 0.74, eGFR greater than 60, nonfasting blood glucose 103, WBC 7.2, hemoglobin 10.6, platelets of 515.  BNP was elevated 2794.  hs troponin was elevated at 20 and on repeat is 20.  UA was positive for small leukocytes.  D-dimer was elevated at 1.39.  Influenza A/influenza B/RSV/COVID PCR were negative.  CTA Chest was done to assess for PE and was read as negative for PE.  Bilateral pleural effusion. Compressive atelectasis in the lower lobes. Coronary artery disease, aortic atherosclerosis.  ED treatment: Furosemide  40 mg IV one-time dose.  04/12/2024: Patient assessed and admitted via telemedicine encounter. ---------------------------------- At bedside, via  telemedicine encounter, patient was able to confirm her first last name, date of birth. Patient reports that she has been short of breath for about 2 weeks.  She was recently diagnosed with flu and was given antibiotics.  She was also recently diagnosed with UTI and completed a course of antibiotic as well.  Initially when she was diagnosed with the flu, she had dark green and brown sputum production that has gradually become more clear.  However the cough is still persistent.  She reports now that shortness of breath is worse with exertion.  She endorses swelling of her bilateral lower extremities.  She denies known sick contacts, chest pain, nausea, vomiting, dysuria, hematuria, blood in her stool, blood in the urine, syncope, loss of consciousness.  She denies any known weight gain at this time.  She endorses generalized weakness.  She reports the leg swelling that was present at home has now improved since the treatment in the emergency department.  She endorses decreased p.o. intake of water .  She probably drinks about 3 to 4 glasses of water /juice/coffee per day.  Social history: She lives at home on her own.  She is able to perform all her home ADLs.  She denies tobacco, THC, recreational drug use she is retired and previously worked for the [employer] for 30 years.  ROS: Constitutional: no weight change, no fever ENT/Mouth: no sore throat, no rhinorrhea Eyes: no eye pain, no vision changes Cardiovascular: no chest pain, + dyspnea,  + edema, no palpitations Respiratory: no cough, no sputum, no wheezing Gastrointestinal: no nausea, no vomiting, no diarrhea, no constipation Genitourinary: no urinary incontinence, no dysuria, no hematuria Musculoskeletal: no arthralgias, no myalgias Skin: no skin lesions, no pruritus, Neuro: + weakness, no  loss of consciousness, no syncope Psych: no anxiety, no depression, + decrease appetite Heme/Lymph: no bruising, no  bleeding  Assessment/Plan  Principal Problem:   CHF exacerbation (HCC) Active Problems:   DOE (dyspnea on exertion)   Cough   GERD (gastroesophageal reflux disease)   Osteoarthritis   History of colon cancer   History of rectal cancer   Aortic insufficiency   History of colon resection   Fluid overload   Assessment and Plan:  * CHF exacerbation (HCC) Strict I's and O's Continue with torsemide 20 mg IV twice daily, 3 doses ordered on admission Complete echo ordered A.m. team to consider consultation to cardiology pending complete echo results Admit to telemetry, [place]  DOE (dyspnea on exertion) Suspect fluid overload DuoNebs scheduled 4 times daily, 4 doses ordered on admission DuoNebs every 4 hours as needed for wheezing and shortness of breath  GERD (gastroesophageal reflux disease) PPI  Cough Secondary to heart failure, fluid overload During the day, Robitussin DM to loosen phlegm and cough, scheduled DuoNeb treatment, 4 doses ordered on admission Incentive spirometry, flutter valve every 2 hours while awake At night, Tussionex 5 mL nightly as needed for cough in order to help patient sleep  Fluid overload Secondary to the above Strict I's and O's  Chart reviewed.   DVT prophylaxis: Enoxaparin  Code Status: Full code Diet: Heart healthy Family Communication: Daughter, Earnie Lewis at bedside has been updated Disposition Plan: Pending clinical course Consults called: TOC Admission status: Telemetry, inpatient  Past Medical History:  Diagnosis Date   Aortic insufficiency    mild to moderate by echo 06/2023   B12 deficiency 03/25/2014   H/o small bowel obstruction with resection as well as colorectal cancer s/p partial colectomy.  Receives B12 injections    Cancer [facility]) 2001, 2008   colon, rectal   Carotid stenosis, asymptomatic, bilateral    1-39% right carotid stenosis by dopplers 03/2022   Chest pain, atypical    no ischemia on nuclear stress test  2019   COVID-19 virus infection 01/14/2021   Treated with molnupiravir .    History of chicken pox    History of Clostridium difficile 2012   History of DVT (deep vein thrombosis) 2002   right leg, after chemo for first cancer, treated   Hypertension    IDA (iron  deficiency anemia)    Open wound of abdominal wall with complication    Osteoarthritis    Osteopenia 09/2011   DEXA 2013 spine -2.2, femur -1.6, DEXA 12/2015 - hip T score -1.9   SBO (small bowel obstruction) (HCC) 02/05/2011   On 02/05/11 she had enterolysis and small bowel resection    Past Surgical History:  Procedure Laterality Date   ABDOMINAL HYSTERECTOMY  1984   APPENDECTOMY  1984   BOWEL RESECTION  02/05/2011   Procedure: SMALL BOWEL RESECTION;  Surgeon: Vicenta DELENA Poli, MD;  Location: [facility] OR;  Service: General;  Laterality: N/A;   broken nose     CARDIOVASCULAR STRESS TEST  11/2015   WNL (Turn[facility])   COLECTOMY     COLON SURGERY  2001 and 2008   COLONOSCOPY  12/2012   erosions at colonic anastomosis, o/w normal rpt 5 yrs (Dr. Lennard at [place])   COLONOSCOPY  01/2018   WNL no rpt recommended ([facility])   COLOSTOMY     DEXA  09/2011   T-score: spine -2.2, femur -1.6   ESOPHAGOGASTRODUODENOSCOPY  01/31/2011   Procedure: ESOPHAGOGASTRODUODENOSCOPY (EGD);  Surgeon: Norleen JAYSON Hint, MD;  Location: [facility] ENDOSCOPY;  Service: Endoscopy;  Laterality: N/A;  duodenal bx;s r/o giardia, r/o celiac diease   EYE SURGERY  2018   cataract surgery   EYE SURGERY  2019   laser surgery   FLEXIBLE SIGMOIDOSCOPY  01/31/2011   Procedure: FLEXIBLE SIGMOIDOSCOPY;  Surgeon: Norleen JAYSON Hint, MD;  Location: [facility] ENDOSCOPY;  Service: Endoscopy;  Laterality: N/A;   ILEOSTOMY     JOINT REPLACEMENT  2010   knee    LAPAROTOMY  02/05/2011   Procedure: EXPLORATORY LAPAROTOMY;  Surgeon: Vicenta DELENA Poli, MD;  Location: [facility] OR;  Service: General;  Laterality: N/A;  lysis of adhesions.   NUCLEAR STRESS TEST  11/17/08   NORMAL   REPLACEMENT TOTAL KNEE  2010    RIGHT KNEE   REVERSE SHOULDER ARTHROPLASTY Right 07/28/2019   Procedure: REVERSE SHOULDER ARTHROPLASTY;  Surgeon: Melita Drivers, MD;  Location: [facility] ORS;  Service: Orthopedics;  Laterality: Right;  exparel    WOUND DEBRIDEMENT N/A 05/21/2012   Procedure: Excision non healing ABDOMINAL WOUND;  Surgeon: Vicenta DELENA Poli, MD;  Location: [facility] OR;  Service: General;  Laterality: N/A;   Social History:  reports that she has never smoked. She has never used smokeless tobacco. She reports that she does not drink alcohol and does not use drugs.  Allergies[1] Family History  Problem Relation Age of Onset   Heart disease Mother        valve problems   Heart attack Mother    Congestive Heart Failure Mother    Heart disease Father        due to lupus   Lupus Father    Heart attack Father    Colon cancer Paternal Grandmother    Coronary artery disease Brother        s/p CABG   Coronary artery disease Sister        s/p CABG   Heart attack Brother    Heart attack Sister    Diabetes Neg Hx    Stroke Neg Hx    Hypertension Neg Hx    Family history: Family history reviewed and not pertinent.  Prior to Admission medications  Medication Sig Start Date End Date Taking? Authorizing Provider  acetaminophen  (TYLENOL ) 500 MG tablet Take 500-1,000 mg by mouth every 6 (six) hours as needed for moderate pain or headache.   Yes [provider]  amLODipine  (NORVASC ) 5 MG tablet TAKE 1 TABLET (5 MG TOTAL) BY MOUTH DAILY. 03/21/24  Yes Rilla Baller, MD  amoxicillin -clavulanate (AUGMENTIN ) 875-125 MG tablet Take 1 tablet by mouth 2 (two) times daily. 04/05/24  Yes [provider]  aspirin  EC 81 MG tablet Take 81 mg by mouth daily.   Yes [provider]  atenolol  (TENORMIN ) 25 MG tablet Take 1 tablet (25 mg total) by mouth 2 (two) times daily. Patient taking differently: Take 25 mg by mouth daily. 02/25/23  Yes Rilla Baller, MD  Calcium  Carbonate (CALCIUM  600 PO) Take 1 tablet by  mouth at bedtime.   Yes [provider]  Cholecalciferol (D3-50) 1.25 MG (50000 UT) capsule TAKE 1 CAPSULE BY MOUTH ONE TIME PER WEEK Patient taking differently: 1 capsule by mouth every other week 09/08/23  Yes Rilla Baller, MD  Cyanocobalamin  (B-12) 1000 MCG SUBL Place 1 tablet under the tongue every Monday, Wednesday, and Friday. 08/21/21  Yes Rilla Baller, MD  ferrous sulfate  325 (65 FE) MG tablet Take 1 tablet (325 mg total) by mouth every other day. 02/12/21  Yes Rilla Baller, MD  hydrALAZINE  (APRESOLINE ) 25 MG tablet  TAKE 1 TABLET (25 MG TOTAL) BY MOUTH 2 (TWO) TIMES DAILY AS NEEDED ((BP>160/100)). 01/08/21  Yes Turner, Wilbert SAUNDERS, MD  meloxicam (MOBIC) 7.5 MG tablet Take 7.5 mg by mouth daily as needed for pain. 05/06/23  Yes [provider]  nitrofurantoin , macrocrystal-monohydrate, (MACROBID ) 100 MG capsule Take 100 mg by mouth 2 (two) times daily. 03/29/24  Yes [provider]  nitroGLYCERIN  (NITROSTAT ) 0.4 MG SL tablet Place 1 tablet (0.4 mg total) under the tongue every 5 (five) minutes as needed for chest pain. 11/28/20  Yes Rilla Baller, MD  omeprazole  (PRILOSEC) 20 MG capsule TAKE 1 CAPSULE BY MOUTH EVERY DAY 03/21/24  Yes Rilla Baller, MD  triamcinolone  cream (KENALOG ) 0.1 % APPLY TO AFFECTED AREA TWICE A DAY 09/08/23  Yes Rilla Baller, MD  oxybutynin  (DITROPAN ) 5 MG tablet TAKE 1 TABLET (5 MG TOTAL) BY MOUTH AT BEDTIME AS NEEDED FOR BLADDER SPASMS. Patient not taking: Reported on 04/12/2024 01/04/24   Rilla Baller, MD  sulfamethoxazole -trimethoprim  (BACTRIM  DS) 800-160 MG tablet Take 1 tablet by mouth 2 (two) times daily. Patient not taking: Reported on 04/12/2024 12/10/23   Rilla Baller, MD  CALCIUM -VITAMIN D  PO Take 1 tablet by mouth 2 (two) times daily.  12/23/18  [provider]   Physical Exam completed with assistance of: Carlin Salt, RN, who was at bedside during this portion of the virtual encounter:  Vitals:    04/12/24 1125 04/12/24 1150 04/12/24 1308 04/12/24 1535  BP:  (!) 150/57 (!) 136/51   Pulse: 90 88 73 78  Resp:  20 20 16   Temp:   98.6 F (37 C)   TempSrc:   Oral   SpO2: 93% 94% 93% 94%  Weight:   65.3 kg   Height:   4' 11 (1.499 m)    Constitutional: appears younger than chronological age, NAD, calm Eyes: EOMI, conjunctivae normal ENMT: Mucous membranes are moist. Hearing appropriate Neck: normal, supple, no masses, no thyromegaly Respiratory: Diminished lung sounds in the bilateral lower lobes, otherwise clear to auscultation in bilateral upper lobes , no wheezing. Normal respiratory effort. No accessory muscle use.  Cardiovascular: Regular rate and rhythm, no murmurs. No extremity edema.  Abdomen: no tenderness. Bowel sounds positive.  Musculoskeletal: No joint deformity upper and lower extremities. Good ROM, no contractures, no atrophy. Skin: no rashes, ulcers on visible skin Neurologic: Strength is appropriate upper extremities.  Psychiatric: Normal judgment and insight. Alert and oriented x 3. Normal mood.   EKG: independently reviewed, showing sinus rhythm with rate of 77, QTc 438  Chest x-ray on Admission: I personally reviewed and I agree with radiologist reading as below.  CT Angio Chest PE W and/or Wo Contrast Result Date: 04/11/2024 EXAM: CTA CHEST 04/11/2024 04:45:44 PM TECHNIQUE: CTA of the chest was performed after the administration of intravenous contrast. Multiplanar reformatted images are provided for review. MIP images are provided for review. Automated exposure control, iterative reconstruction, and/or weight based adjustment of the mA/kV was utilized to reduce the radiation dose to as low as reasonably achievable. COMPARISON: 12/27/2006 CLINICAL HISTORY: Pulmonary embolism (PE) suspected, low to intermediate prob, positive D-dimer. FINDINGS: PULMONARY ARTERIES: Pulmonary arteries are adequately opacified for evaluation. No acute pulmonary embolus. Main  pulmonary artery is normal in caliber. MEDIASTINUM: The heart and pericardium demonstrate no acute abnormality. Scattered coronary artery and aortic atherosclerosis. There is no acute abnormality of the thoracic aorta. LYMPH NODES: No mediastinal, hilar or axillary lymphadenopathy. LUNGS AND PLEURA: Small bilateral pleural effusions. Compressive atelectasis in the lower  lobes. No pneumothorax. UPPER ABDOMEN: Small hiatal hernia. SOFT TISSUES AND BONES: No acute bone or soft tissue abnormality. IMPRESSION: 1. No pulmonary embolism. 2. Small bilateral pleural effusions and compressive atelectasis in the lower lobes. 3. Coronary artery disease, aortic atherosclerosis. Electronically signed by: Franky Crease MD 04/11/2024 05:08 PM EST RP Workstation: [id number]   US  ABDOMEN LIMITED RUQ (LIVER/GB) Result Date: 04/11/2024 EXAM: Right Upper Quadrant Abdominal Ultrasound 04/11/2024 03:48:00 PM TECHNIQUE: Real-time ultrasonography of the right upper quadrant of the abdomen was performed. COMPARISON: US  Abdomen complete 09/13/2012. CLINICAL HISTORY: RUQ pain. FINDINGS: LIVER: Normal echogenicity. No intrahepatic biliary ductal dilatation. No evidence of mass. Hepatopetal flow in the portal vein. BILIARY SYSTEM: Gallbladder wall thickness measures 3.2 mm. No pericholecystic fluid. No cholelithiasis. Common bile duct measures 7.0 mm. RIGHT KIDNEY: No hydronephrosis. No echogenic calculi. No mass. PANCREAS: Visualized portions of the pancreas are unremarkable. OTHER: No right upper quadrant ascites. IMPRESSION: 1. No acute right upper quadrant findings. 2. Borderline gallbladder wall thickening measuring 3.2 mm. 3. Common bile duct measures 7.0 mm which is within normal limits for the patient's age. Electronically signed by: Greig Pique MD 04/11/2024 04:10 PM EST RP Workstation: [id number]   DG Chest 2 View Result Date: 04/11/2024 EXAM: 2 VIEW(S) XRAY OF THE CHEST 04/11/2024 01:10:00 PM COMPARISON: 05/26/2019 CLINICAL  HISTORY: sob, cough, flu, FINDINGS: LUNGS AND PLEURA: Mild pulmonary edema. Mild interstitial prominence. New small bilateral pleural effusions. Patchy basilar atelectasis. No pneumothorax. HEART AND MEDIASTINUM: Mild cardiomegaly. No acute abnormality of the mediastinal silhouette. BONES AND SOFT TISSUES: Interval right shoulder arthroplasty. IMPRESSION: 1. Mild pulmonary edema with interstitial prominence. 2. Small bilateral pleural effusions with patchy basilar atelectasis. 3. Mild cardiomegaly. Electronically signed by: Katheleen Faes MD 04/11/2024 02:26 PM EST RP Workstation: [id number]   Labs on Admission: I have personally reviewed following labs  CBC: Recent Labs  Lab 04/11/24 1154  WBC 7.2  HGB 10.6*  HCT 32.2*  MCV 89.0  PLT 519*   Basic Metabolic Panel: Recent Labs  Lab 04/11/24 1300  NA 140  K 4.5  CL 104  CO2 24  GLUCOSE 103*  BUN 14  CREATININE 0.75  CALCIUM  9.9   GFR: Estimated Creatinine Clearance: 37.6 mL/min (by C-G formula based on SCr of 0.75 mg/dL).  Liver Function Tests: Recent Labs  Lab 04/11/24 1300  AST 19  ALT 14  ALKPHOS 102  BILITOT 0.4  PROT 6.7  ALBUMIN 3.8   BNP (last 3 results) Recent Labs    04/11/24 1515  PROBNP 2,794.0*   Urine analysis:    Component Value Date/Time   COLORURINE COLORLESS (A) 04/11/2024 1320   APPEARANCEUR CLEAR 04/11/2024 1320   LABSPEC <1.005 (L) 04/11/2024 1320   PHURINE 7.5 04/11/2024 1320   GLUCOSEU NEGATIVE 04/11/2024 1320   GLUCOSEU NEGATIVE 02/25/2023 1114   HGBUR NEGATIVE 04/11/2024 1320   BILIRUBINUR NEGATIVE 04/11/2024 1320   BILIRUBINUR 1+ 12/08/2023 0912   KETONESUR NEGATIVE 04/11/2024 1320   PROTEINUR NEGATIVE 04/11/2024 1320   UROBILINOGEN 0.2 12/08/2023 0912   UROBILINOGEN 0.2 02/25/2023 1114   NITRITE NEGATIVE 04/11/2024 1320   LEUKOCYTESUR SMALL (A) 04/11/2024 1320   This document was prepared using Dragon Voice Recognition software and may include unintentional dictation  errors.  Dr. Sherre [facility] My Location: [place]  If 7PM-7AM, please contact overnight-coverage provider If 7AM-7PM, please contact day attending provider [email or web address]  04/12/2024, 5:05 PM      [1]  Allergies Allergen Reactions   Morphine And Codeine Nausea And Vomiting  Lisinopril Cough   Losartan  Cough   "

## 2024-04-12 NOTE — Assessment & Plan Note (Signed)
 Secondary to heart failure, fluid overload During the day, Robitussin DM to loosen phlegm and cough, scheduled DuoNeb treatment, 4 doses ordered on admission Incentive spirometry, flutter valve every 2 hours while awake At night, Tussionex 5 mL nightly as needed for cough in order to help patient sleep

## 2024-04-12 NOTE — ED Notes (Signed)
[facility] here at [facility].

## 2024-04-12 NOTE — Assessment & Plan Note (Signed)
-   PPI

## 2024-04-12 NOTE — ED Notes (Signed)
 Pt alert, NAD, calm, interactive, resps e/u, speaking in clear complete sentences. Family at [facility]. Virtual hospitalist assessment in progress at this time. Given IS with instruction.

## 2024-04-12 NOTE — ED Notes (Signed)
 Preparing to transport, lasix  held, pt denis need and declines neb. VSS. Denies pain.

## 2024-04-12 NOTE — Hospital Course (Addendum)
 Ms. Bernie Fobes is a 89 year old female with history of hypertension, history of C. difficile, history of aortic stenosis, colon surgery, recent UTI, history of DVT in 2002 postsurgery, completed 6 months of warfarin, currently not on anticoagulation.  04/11/2024: She presents to the ED for chief concerns of shortness of breath for 2 weeks, cough that is productive.  Vitals at the time of my evaluation showed temperature 98.5, respiration 24, heart rate 124, blood pressure 153/61, SpO2 96% on room air.  Serum sodium is 140, potassium 4.5, chloride 104, bicarb 24, BUN of 14, serum creatinine 0.74, eGFR greater than 60, nonfasting blood glucose 103, WBC 7.2, hemoglobin 10.6, platelets of 515.  BNP was elevated 2794.  hs troponin was elevated at 20 and on repeat is 20.  UA was positive for small leukocytes.  D-dimer was elevated at 1.39.  Influenza A/influenza B/RSV/COVID PCR were negative.  CTA Chest was done to assess for PE and was read as negative for PE.  Bilateral pleural effusion. Compressive atelectasis in the lower lobes. Coronary artery disease, aortic atherosclerosis.  ED treatment: Furosemide  40 mg IV one-time dose.  04/12/2024: Patient assessed and admitted via telemedicine encounter.

## 2024-04-12 NOTE — Assessment & Plan Note (Signed)
 Suspect fluid overload DuoNebs scheduled 4 times daily, 4 doses ordered on admission DuoNebs every 4 hours as needed for wheezing and shortness of breath

## 2024-04-12 NOTE — ED Notes (Signed)
 Provided pt with ice water

## 2024-04-12 NOTE — Assessment & Plan Note (Signed)
 Secondary to the above Strict I's and O's

## 2024-04-12 NOTE — Assessment & Plan Note (Deleted)
 Strict I's and O's Continue with torsemide 20 mg IV twice daily, 3 doses ordered on admission Complete echo ordered A.m. team to consider consultation to cardiology pending complete echo results Admit to telemetry, 

## 2024-04-12 NOTE — Plan of Care (Signed)
" °  Problem: Clinical Measurements: Goal: Will remain free from infection Outcome: Progressing Goal: Respiratory complications will improve Outcome: Progressing   Problem: Nutrition: Goal: Adequate nutrition will be maintained Outcome: Progressing   Problem: Safety: Goal: Ability to remain free from injury will improve Outcome: Progressing   Problem: Activity: Goal: Capacity to carry out activities will improve Outcome: Progressing   Problem: Cardiac: Goal: Ability to achieve and maintain adequate cardiopulmonary perfusion will improve Outcome: Progressing   "

## 2024-04-13 ENCOUNTER — Inpatient Hospital Stay (HOSPITAL_COMMUNITY)

## 2024-04-13 ENCOUNTER — Encounter (HOSPITAL_COMMUNITY): Payer: Self-pay | Admitting: Internal Medicine

## 2024-04-13 DIAGNOSIS — R0609 Other forms of dyspnea: Secondary | ICD-10-CM

## 2024-04-13 DIAGNOSIS — I10 Essential (primary) hypertension: Secondary | ICD-10-CM

## 2024-04-13 DIAGNOSIS — J069 Acute upper respiratory infection, unspecified: Secondary | ICD-10-CM | POA: Diagnosis not present

## 2024-04-13 DIAGNOSIS — I509 Heart failure, unspecified: Secondary | ICD-10-CM | POA: Diagnosis not present

## 2024-04-13 DIAGNOSIS — R059 Cough, unspecified: Secondary | ICD-10-CM

## 2024-04-13 DIAGNOSIS — R7989 Other specified abnormal findings of blood chemistry: Secondary | ICD-10-CM | POA: Diagnosis not present

## 2024-04-13 DIAGNOSIS — I5033 Acute on chronic diastolic (congestive) heart failure: Secondary | ICD-10-CM | POA: Diagnosis not present

## 2024-04-13 LAB — CBC
HCT: 30.1 % — ABNORMAL LOW (ref 36.0–46.0)
Hemoglobin: 9.8 g/dL — ABNORMAL LOW (ref 12.0–15.0)
MCH: 29.3 pg (ref 26.0–34.0)
MCHC: 32.6 g/dL (ref 30.0–36.0)
MCV: 90.1 fL (ref 80.0–100.0)
Platelets: 451 K/uL — ABNORMAL HIGH (ref 150–400)
RBC: 3.34 MIL/uL — ABNORMAL LOW (ref 3.87–5.11)
RDW: 12.6 % (ref 11.5–15.5)
WBC: 5.8 K/uL (ref 4.0–10.5)
nRBC: 0 % (ref 0.0–0.2)

## 2024-04-13 LAB — ECHOCARDIOGRAM COMPLETE
AR max vel: 2.33 cm2
AV Area VTI: 2.52 cm2
AV Area mean vel: 2.44 cm2
AV Mean grad: 3 mmHg
AV Peak grad: 4.7 mmHg
Ao pk vel: 1.08 m/s
Area-P 1/2: 3.27 cm2
Height: 59 in
MV M vel: 3.14 m/s
MV Peak grad: 39.4 mmHg
MV VTI: 1.92 cm2
S' Lateral: 2.7 cm
Weight: 2289.26 [oz_av]

## 2024-04-13 LAB — BASIC METABOLIC PANEL WITH GFR
Anion gap: 11 (ref 5–15)
BUN: 21 mg/dL (ref 8–23)
CO2: 26 mmol/L (ref 22–32)
Calcium: 8.9 mg/dL (ref 8.9–10.3)
Chloride: 100 mmol/L (ref 98–111)
Creatinine, Ser: 0.92 mg/dL (ref 0.44–1.00)
GFR, Estimated: 59 mL/min — ABNORMAL LOW
Glucose, Bld: 91 mg/dL (ref 70–99)
Potassium: 3.9 mmol/L (ref 3.5–5.1)
Sodium: 137 mmol/L (ref 135–145)

## 2024-04-13 MED ORDER — SPIRONOLACTONE 12.5 MG HALF TABLET
12.5000 mg | ORAL_TABLET | Freq: Every day | ORAL | Status: DC
  Administered 2024-04-13 – 2024-04-14 (×2): 12.5 mg via ORAL
  Filled 2024-04-13 (×2): qty 1

## 2024-04-13 NOTE — Progress Notes (Signed)
 " PROGRESS NOTE    Priscilla [name]  FMW:[id number] DOB: Jul 11, 1930 DOA: 04/11/2024 PCP: Rilla Baller, MD    Chief Complaint  Patient presents with   Shortness of Breath    Brief Narrative:  Priscilla [name] is a 89 y/o female patient with a history of HTN, GERD, colon and rectal cancer and nonrheumatic aortic and pulmonary insufficiency. Two weeks prior, patient started feeling SOB, and went to UC, was diagnosed with flu and UTI, given nitrofurantoin  and completed course, and tamiflu.  1 Week later she still had a productive cough, and given augmentin  for sinus infection at [facility]. Presented to ED two days prior with SOB, fatigue, and leg swelling following episode of chest pain night prior that was relieved with two nitroglycerin  tablets. Had focal tenderness in right upper quadrant, ultrasound (1/19) showed no acute cholecystitis. Denies PND and orthopneic dyspnea.  XRAY 1/19- showed mild pulmonary edema and bilateral pleural effusions CT 1/19- no PE, small bilateral pleural effusions and CAD present Echo from 07/11/23- preserved ejection fraction, no evidence of heart failure, but severe aortic and pulmonary insufficiency noted. EKG 1/19- Sinus rhythm w/o ST changes  LEE has decreased since admin of lasix  in ED.  Negative for flu/covid/rsv Echo scheduled for today 1/21 BMP normal except for 59 GFR Vitals on admission: 159/103 BP, 95%SPO2, 79 PR, 16 RR  Assessment & Plan:   CHF exacerbation (HCC) causing fluid overload Aortic insufficiency Echo from 4/19- preserved ejection fraction, no evidence of heart failure, but severe aortic and pulmonary insufficiency.  No edema in lower extremity, no rales noted, and no DOE.  BNP 1,794 on 1/19, and Troponin high sensitivity 20 2.1 L of urine output over last two days Awaiting Echo scheduled for today.  Strict I/O Defer lasix     UTI Pt had asymptomatic but positive for UTI via UA 2 weeks prior, completed antibiotic treatment UA on 1/19,  negative for bacteria but small leukocytes present Denies polyuria, abnormal intolerance (has at baseline), dysuria, urgency, or bladder fullness or pelvic pain. Resolved at this time    Post-influenza cough Pt has mild residual cough from sinus infection and possible Heart Failure No wheezing noted, clear bilat breath sounds.  Defer breathing treatments unless needed Continue supportive care, robitussin and tussionex    GERD (gastroesophageal reflux disease) Continue PPI    Anemia-normocytic HgB 9.8, continue Iron  at home    DVT prophylaxis: Lovenox  Code Status: Full Family Communication: None at bedside    Consultants:  Cardiology    Subjective: Pt is found AxOx4, in no acute distress seated in recliner. Pt gave good history about current illness, and states she feels much better now, with a mildly productive cough with clear sputum. No shortness of breath at this time, and says her leg swelling as gone down os much she thinks it is gone. No increased respiratory effort, and she states she does not feel dyspneic on exertion anymore, and was able to ambulate to restroom fine without difficulty.  Pt denies n/v/d, DOE, SOB, headache, chest pain, abdominal pain, polyuria, dysuria, pelvic pain, urinary urgency or abnormal incontinence (has at baseline). Denies PND and orthopneic dyspnea.   Objective: Vitals:   04/12/24 2339 04/13/24 0326 04/13/24 0720 04/13/24 0846  BP: 130/83 (!) 141/67 (!) 138/120   Pulse: 71 72 90 90  Resp: 18 19 18 18   Temp: 97.7 F (36.5 C) 98.6 F (37 C) 98.4 F (36.9 C)   TempSrc: Oral Oral Oral   SpO2: 96% 94% 95% 95%  Weight:  64.9 kg    Height:        Intake/Output Summary (Last 24 hours) at 04/13/2024 1031 Last data filed at 04/13/2024 0838 Gross per 24 hour  Intake 460 ml  Output 200 ml  Net 260 ml   Filed Weights   04/12/24 1308 04/13/24 0326  Weight: 65.3 kg 64.9 kg    Examination:  General exam: Appears calm and comfortable, No  acute distress Respiratory system: Clear to auscultation. Respiratory effort normal. Cardiovascular system: S1 & S2 heard, RRR. No JVD, murmurs, rubs, gallops or clicks. No pedal edema. Gastrointestinal system: Abdomen is nondistended, soft and nontender. No organomegaly or masses felt. Normal bowel sounds heard. Central nervous system: Alert and oriented. No focal neurological deficits. Extremities: Symmetric 5 x 5 power. No edema noted.  Skin: No rashes, lesions or ulcers     Data Reviewed: I have personally reviewed following labs and imaging studies  CBC: Recent Labs  Lab 04/11/24 1154 04/13/24 0257  WBC 7.2 5.8  HGB 10.6* 9.8*  HCT 32.2* 30.1*  MCV 89.0 90.1  PLT 519* 451*    Basic Metabolic Panel: Recent Labs  Lab 04/11/24 1300 04/13/24 0257  NA 140 137  K 4.5 3.9  CL 104 100  CO2 24 26  GLUCOSE 103* 91  BUN 14 21  CREATININE 0.75 0.92  CALCIUM  9.9 8.9    GFR: Estimated Creatinine Clearance: 32.6 mL/min (by C-G formula based on SCr of 0.92 mg/dL).  Liver Function Tests: Recent Labs  Lab 04/11/24 1300  AST 19  ALT 14  ALKPHOS 102  BILITOT 0.4  PROT 6.7  ALBUMIN 3.8    CBG: No results for input(s): GLUCAP in the last 168 hours.   Recent Results (from the past 240 hours)  Resp panel by RT-PCR (RSV, Flu A&B, Covid) Anterior Nasal Swab     Status: None   Collection Time: 04/11/24 11:37 AM   Specimen: Anterior Nasal Swab  Result Value Ref Range Status   SARS Coronavirus 2 by RT PCR NEGATIVE NEGATIVE Final    Comment: (NOTE) SARS-CoV-2 target nucleic acids are NOT DETECTED.  The SARS-CoV-2 RNA is generally detectable in upper respiratory specimens during the acute phase of infection. The lowest concentration of SARS-CoV-2 viral copies this assay can detect is 138 copies/mL. A negative result does not preclude SARS-Cov-2 infection and should not be used as the sole basis for treatment or other patient management decisions. A negative result may  occur with  improper specimen collection/handling, submission of specimen other than nasopharyngeal swab, presence of viral mutation(s) within the areas targeted by this assay, and inadequate number of viral copies(<138 copies/mL). A negative result must be combined with clinical observations, patient history, and epidemiological information. The expected result is Negative.  Fact Sheet for Patients:  bloggercourse.com  Fact Sheet for Healthcare Providers:  seriousbroker.it  This test is no t yet approved or cleared by the [place]  FDA and  has been authorized for detection and/or diagnosis of SARS-CoV-2 by FDA under an Emergency Use Authorization (EUA). This EUA will remain  in effect (meaning this test can be used) for the duration of the COVID-19 declaration under Section 564(b)(1) of the Act, 21 U.S.C.section 360bbb-3(b)(1), unless the authorization is terminated  or revoked sooner.       Influenza A by PCR NEGATIVE NEGATIVE Final   Influenza B by PCR NEGATIVE NEGATIVE Final    Comment: (NOTE) The Xpert Xpress SARS-CoV-2/FLU/RSV plus assay is intended as an aid in  the diagnosis of influenza from Nasopharyngeal swab specimens and should not be used as a sole basis for treatment. Nasal washings and aspirates are unacceptable for Xpert Xpress SARS-CoV-2/FLU/RSV testing.  Fact Sheet for Patients: bloggercourse.com  Fact Sheet for Healthcare Providers: seriousbroker.it  This test is not yet approved or cleared by the [place]  FDA and has been authorized for detection and/or diagnosis of SARS-CoV-2 by FDA under an Emergency Use Authorization (EUA). This EUA will remain in effect (meaning this test can be used) for the duration of the COVID-19 declaration under Section 564(b)(1) of the Act, 21 U.S.C. section 360bbb-3(b)(1), unless the authorization is terminated  or revoked.     Resp Syncytial Virus by PCR NEGATIVE NEGATIVE Final    Comment: (NOTE) Fact Sheet for Patients: bloggercourse.com  Fact Sheet for Healthcare Providers: seriousbroker.it  This test is not yet approved or cleared by the [place]  FDA and has been authorized for detection and/or diagnosis of SARS-CoV-2 by FDA under an Emergency Use Authorization (EUA). This EUA will remain in effect (meaning this test can be used) for the duration of the COVID-19 declaration under Section 564(b)(1) of the Act, 21 U.S.C. section 360bbb-3(b)(1), unless the authorization is terminated or revoked.  Performed at [facility], [address] [place], [place] [address]          Radiology Studies: CT Angio Chest PE W and/or Wo Contrast Result Date: 04/11/2024 EXAM: CTA CHEST 04/11/2024 04:45:44 PM TECHNIQUE: CTA of the chest was performed after the administration of intravenous contrast. Multiplanar reformatted images are provided for review. MIP images are provided for review. Automated exposure control, iterative reconstruction, and/or weight based adjustment of the mA/kV was utilized to reduce the radiation dose to as low as reasonably achievable. COMPARISON: 12/27/2006 CLINICAL HISTORY: Pulmonary embolism (PE) suspected, low to intermediate prob, positive D-dimer. FINDINGS: PULMONARY ARTERIES: Pulmonary arteries are adequately opacified for evaluation. No acute pulmonary embolus. Main pulmonary artery is normal in caliber. MEDIASTINUM: The heart and pericardium demonstrate no acute abnormality. Scattered coronary artery and aortic atherosclerosis. There is no acute abnormality of the thoracic aorta. LYMPH NODES: No mediastinal, hilar or axillary lymphadenopathy. LUNGS AND PLEURA: Small bilateral pleural effusions. Compressive atelectasis in the lower lobes. No pneumothorax. UPPER ABDOMEN: Small hiatal hernia. SOFT  TISSUES AND BONES: No acute bone or soft tissue abnormality. IMPRESSION: 1. No pulmonary embolism. 2. Small bilateral pleural effusions and compressive atelectasis in the lower lobes. 3. Coronary artery disease, aortic atherosclerosis. Electronically signed by: Franky Crease MD 04/11/2024 05:08 PM EST RP Workstation: [id number]   US  ABDOMEN LIMITED RUQ (LIVER/GB) Result Date: 04/11/2024 EXAM: Right Upper Quadrant Abdominal Ultrasound 04/11/2024 03:48:00 PM TECHNIQUE: Real-time ultrasonography of the right upper quadrant of the abdomen was performed. COMPARISON: US  Abdomen complete 09/13/2012. CLINICAL HISTORY: RUQ pain. FINDINGS: LIVER: Normal echogenicity. No intrahepatic biliary ductal dilatation. No evidence of mass. Hepatopetal flow in the portal vein. BILIARY SYSTEM: Gallbladder wall thickness measures 3.2 mm. No pericholecystic fluid. No cholelithiasis. Common bile duct measures 7.0 mm. RIGHT KIDNEY: No hydronephrosis. No echogenic calculi. No mass. PANCREAS: Visualized portions of the pancreas are unremarkable. OTHER: No right upper quadrant ascites. IMPRESSION: 1. No acute right upper quadrant findings. 2. Borderline gallbladder wall thickening measuring 3.2 mm. 3. Common bile duct measures 7.0 mm which is within normal limits for the patient's age. Electronically signed by: Greig Pique MD 04/11/2024 04:10 PM EST RP Workstation: [id number]   DG Chest 2 View Result Date: 04/11/2024 EXAM: 2 VIEW(S) XRAY OF THE CHEST 04/11/2024  01:10:00 PM COMPARISON: 05/26/2019 CLINICAL HISTORY: sob, cough, flu, FINDINGS: LUNGS AND PLEURA: Mild pulmonary edema. Mild interstitial prominence. New small bilateral pleural effusions. Patchy basilar atelectasis. No pneumothorax. HEART AND MEDIASTINUM: Mild cardiomegaly. No acute abnormality of the mediastinal silhouette. BONES AND SOFT TISSUES: Interval right shoulder arthroplasty. IMPRESSION: 1. Mild pulmonary edema with interstitial prominence. 2. Small bilateral pleural  effusions with patchy basilar atelectasis. 3. Mild cardiomegaly. Electronically signed by: Dayne Hassell MD 04/11/2024 02:26 PM EST RP Workstation: [id number]        Scheduled Meds:  amLODipine   5 mg Oral Daily   atenolol   25 mg Oral Daily   vitamin B-12  1,000 mcg Oral Q M,W,F   enoxaparin  (LOVENOX ) injection  40 mg Subcutaneous Q24H   ferrous sulfate   325 mg Oral QODAY   furosemide   20 mg Intravenous BID   pantoprazole   40 mg Oral Daily   Continuous Infusions:   LOS: 1 day    Time spent: 20 minutes    Priscilla [name] [employer], PA-S [facility]   To contact the attending provider between 7A-7P or the covering provider during after hours 7P-7A, please log into the web site [email or web address] and access using universal [place] password for that web site. If you do not have the password, please call the hospital operator.  04/13/2024, 10:31 AM   "

## 2024-04-13 NOTE — Plan of Care (Signed)
" °  Problem: Education: Goal: Knowledge of General Education information will improve Description: Including pain rating scale, medication(s)/side effects and non-pharmacologic comfort measures Outcome: Progressing   Problem: Clinical Measurements: Goal: Will remain free from infection Outcome: Progressing   Problem: Clinical Measurements: Goal: Respiratory complications will improve Outcome: Progressing   Problem: Clinical Measurements: Goal: Cardiovascular complication will be avoided Outcome: Progressing   Problem: Nutrition: Goal: Adequate nutrition will be maintained Outcome: Progressing   Problem: Activity: Goal: Risk for activity intolerance will decrease Outcome: Progressing   Problem: Coping: Goal: Level of anxiety will decrease Outcome: Progressing   "

## 2024-04-14 ENCOUNTER — Other Ambulatory Visit (HOSPITAL_COMMUNITY): Payer: Self-pay

## 2024-04-14 DIAGNOSIS — Z85038 Personal history of other malignant neoplasm of large intestine: Secondary | ICD-10-CM | POA: Diagnosis not present

## 2024-04-14 DIAGNOSIS — I5033 Acute on chronic diastolic (congestive) heart failure: Secondary | ICD-10-CM

## 2024-04-14 DIAGNOSIS — M150 Primary generalized (osteo)arthritis: Secondary | ICD-10-CM

## 2024-04-14 DIAGNOSIS — K219 Gastro-esophageal reflux disease without esophagitis: Secondary | ICD-10-CM

## 2024-04-14 DIAGNOSIS — I10 Essential (primary) hypertension: Secondary | ICD-10-CM | POA: Diagnosis not present

## 2024-04-14 LAB — BASIC METABOLIC PANEL WITH GFR
Anion gap: 14 (ref 5–15)
BUN: 27 mg/dL — ABNORMAL HIGH (ref 8–23)
CO2: 21 mmol/L — ABNORMAL LOW (ref 22–32)
Calcium: 9 mg/dL (ref 8.9–10.3)
Chloride: 101 mmol/L (ref 98–111)
Creatinine, Ser: 0.95 mg/dL (ref 0.44–1.00)
GFR, Estimated: 56 mL/min — ABNORMAL LOW
Glucose, Bld: 93 mg/dL (ref 70–99)
Potassium: 4 mmol/L (ref 3.5–5.1)
Sodium: 136 mmol/L (ref 135–145)

## 2024-04-14 LAB — MAGNESIUM: Magnesium: 2 mg/dL (ref 1.7–2.4)

## 2024-04-14 MED ORDER — SPIRONOLACTONE 25 MG PO TABS
12.5000 mg | ORAL_TABLET | Freq: Every day | ORAL | 0 refills | Status: AC
  Filled 2024-04-14: qty 15, 30d supply, fill #0

## 2024-04-14 MED ORDER — FUROSEMIDE 20 MG PO TABS
20.0000 mg | ORAL_TABLET | Freq: Every day | ORAL | 0 refills | Status: AC | PRN
  Filled 2024-04-14: qty 30, 30d supply, fill #0

## 2024-04-14 MED ORDER — FUROSEMIDE 20 MG PO TABS: 20.0000 mg | ORAL_TABLET | Freq: Every day | ORAL | Status: DC | PRN

## 2024-04-14 NOTE — Assessment & Plan Note (Addendum)
 Follow up as outpatient  Continue calcium  and vitamin D 

## 2024-04-14 NOTE — Assessment & Plan Note (Addendum)
 Echocardiogram with preserved LV systolic function EF 60 to 65%, mild LVH, RV systolic function preserved, LA and RA severe dilatation, no significant valvular dysfunction.   Patient was placed on IV furosemide  for diuresis, negative fluid balance was achieved - 2,400 ml with significant improvement in her symptoms.  Patient has been placed on spironolactone  for RAAS inhibition.  Not ARB due to history of cough related angiotensin receptor blocker use. Hold on SGLT 2 inh due to recent urinary tract infection.  Use loop diuretic as needed in case of volume overload. Follow up as outpatient  Continue with atenolol    High sensitive troponin elevation due to heart failure, no acute coronary syndrome

## 2024-04-14 NOTE — TOC CM/SW Note (Signed)
 Transition of Care [facility]) - Inpatient Brief Assessment   Patient Details  Name: Priscilla [name] MRN: [id number] Date of Birth: 02-Apr-1930  Transition of Care [facility]) CM/SW Contact:    Waddell Barnie Rama, RN Phone Number: 04/14/2024, 10:50 AM   Clinical Narrative: From home alone, has PCP and insurance on file, states has no HH services in place at this time , has walker/cane at home.  She states she does not want HHRN at this time. States family member (daughter)  will transport them home at [employer] and family is support system, states gets medications from [facility] in [place] .  Pta self ambulatory.   There are no ICM  needs identified  at this time.  Please place consult for ICM  needs.     Transition of Care Asessment: Insurance and Status: Insurance coverage has been reviewed Patient has primary care physician: Yes Home environment has been reviewed: home alone Prior level of function:: indep Prior/Current Home Services: Current home services (walker/cane) Social Drivers of Health Review: SDOH reviewed no interventions necessary Readmission risk has been reviewed: Yes Transition of care needs: no transition of care needs at this time

## 2024-04-14 NOTE — Discharge Summary (Addendum)
 " Physician Discharge Summary   Patient: Priscilla [name] MRN: [id number] DOB: April 17, 1930  Admit date:     04/11/2024  Discharge date: 04/14/24  Discharge Physician: Elidia Sieving Muriah Harsha   PCP: Rilla Baller, MD   Recommendations at discharge:    Patient has been placed on spironolactone  for RAAS inhibition, continue atenolol , no ARB due to cough related side effect. No SGLT 2 inh due to recent urine infection  Loop diuretic as needed in case of volume overload, weight gain 2 to 3 lbs in 24 hrs or 5 lbs in 7 days.  Follow up renal function and electrolytes in 7 days as outpatient Follow up with Dr Rilla in 7 to 10 days   I spoke with patient's daughter over the phone, we talked in detail about patient's condition, plan of care and prognosis and all questions were addressed.   Discharge Diagnoses: Principal Problem:   Congestive heart failure (HCC) Active Problems:   DOE (dyspnea on exertion)   Cough   GERD (gastroesophageal reflux disease)   Osteoarthritis   History of colon cancer   History of rectal cancer   Aortic insufficiency   History of colon resection   Fluid overload   Upper respiratory tract infection  Resolved Problems:   * No resolved hospital problems. [facility] Course: Priscilla [name] was admitted to the hospital with the working diagnosis of heart failure decompensation   89 yo female with the past medical history of hypertension, and remote history of DVT post surgery who presented with dyspnea and edema.  Reported 2 weeks of progressive dyspnea and lower extremity edema, associated with productive cough. Recent influenza and urinary tract infection treated as outpatient with oseltamivir, nitrofurantoin  and Augmentin .  She continue to have symptoms of dyspnea at home despite medical therapy. She noted persistent cough and pedal edema.  On her initial physical examination her blood pressure was 153/61, HR 124, RR 24 and 02 saturation 96% on room air   Lungs with decreased breath sounds bilaterally, heart with S1 and S2 present and regular with no gallops or rubs, abdomen with no distention and no lower extremity edema   Na 140, K 4,5 Cl 104 bicarbonate 24 glucose 103 bun 14 cr 0,75  BNP 2,794 High sensitive troponin 20 and 20  Wbc 5.8 hgb 9,8 plt 451  D dimer 1,39  Influenza negative RSV negative COVID 19 negative   Urine analysis SG <1.005, negative protein, small leukocytes and negative hgb   Chest radiograph with cardiomegaly, bilateral hilar vascular congestion, bilateral central interstitial infiltrates, small bilateral pleural effusions   EKG 73 bpm, normal axis, normal intervals, qtc 438, sinus rhythm with 1st degree AV block, with no significant ST segment or  T wave changes   CT chest with no pulmonary embolism Small bilateral pleural effusions with compressive atelectasis in the lower lobes, faint bilateral ground glass opacities.  Coronary artery disease.   Patient was placed on furosemide  for diuresis  Echocardiogram with preserved LV systolic function.   01/22 patient with euvolemic state, follow up as outpatient.   Assessment and Plan: Acute on chronic diastolic CHF (congestive heart failure) (HCC) Echocardiogram with preserved LV systolic function EF 60 to 65%, mild LVH, RV systolic function preserved, LA and RA severe dilatation, no significant valvular dysfunction.   Patient was placed on IV furosemide  for diuresis, negative fluid balance was achieved - 2,400 ml with significant improvement in her symptoms.  Patient has been placed on spironolactone  for RAAS inhibition.  Not  ARB due to history of cough related angiotensin receptor blocker use. Hold on SGLT 2 inh due to recent urinary tract infection.  Use loop diuretic as needed in case of volume overload. Follow up as outpatient  Continue with atenolol    High sensitive troponin elevation due to heart failure, no acute coronary syndrome   Essential  hypertension Continue blood pressure control with amlodipine  and atenolol   Added spironolactone    GERD (gastroesophageal reflux disease) Continue pantoprazole    Osteoarthritis Follow up as outpatient  Continue calcium  and vitamin D    History of colon cancer Follow up as outpatient        Consultants: none  Procedures performed: none   Disposition: Home Diet recommendation:  Cardiac diet DISCHARGE MEDICATION: Allergies as of 04/14/2024       Reactions   Morphine And Codeine Nausea And Vomiting   Lisinopril Cough   Losartan  Cough        Medication List     STOP taking these medications    amoxicillin -clavulanate 875-125 MG tablet Commonly known as: AUGMENTIN    hydrALAZINE  25 MG tablet Commonly known as: APRESOLINE    nitrofurantoin  (macrocrystal-monohydrate) 100 MG capsule Commonly known as: MACROBID    nitroGLYCERIN  0.4 MG SL tablet Commonly known as: NITROSTAT    oxybutynin  5 MG tablet Commonly known as: DITROPAN    sulfamethoxazole -trimethoprim  800-160 MG tablet Commonly known as: BACTRIM  DS       TAKE these medications    acetaminophen  500 MG tablet Commonly known as: TYLENOL  Take 500-1,000 mg by mouth every 6 (six) hours as needed for moderate pain or headache.   amLODipine  5 MG tablet Commonly known as: NORVASC  TAKE 1 TABLET (5 MG TOTAL) BY MOUTH DAILY.   aspirin  EC 81 MG tablet Take 81 mg by mouth daily.   atenolol  25 MG tablet Commonly known as: TENORMIN  Take 1 tablet (25 mg total) by mouth 2 (two) times daily. What changed: when to take this   B-12 1000 MCG Subl Place 1 tablet under the tongue every Monday, Wednesday, and Friday.   CALCIUM  600 PO Take 1 tablet by mouth at bedtime.   D3-50 1.25 MG (50000 UT) capsule Generic drug: Cholecalciferol TAKE 1 CAPSULE BY MOUTH ONE TIME PER WEEK What changed: See the new instructions.   ferrous sulfate  325 (65 FE) MG tablet Take 1 tablet (325 mg total) by mouth every other day.    furosemide  20 MG tablet Commonly known as: LASIX  Take 1 tablet (20 mg total) by mouth daily as needed for edema or fluid (as needed in case of weight gain 2 to 3 lbs in 24 hrs or 5 lbs in 7 days).   meloxicam 7.5 MG tablet Commonly known as: MOBIC Take 7.5 mg by mouth daily as needed for pain.   omeprazole  20 MG capsule Commonly known as: PRILOSEC TAKE 1 CAPSULE BY MOUTH EVERY DAY   spironolactone  25 MG tablet Commonly known as: ALDACTONE  Take 0.5 tablets (12.5 mg total) by mouth daily. Start taking on: April 15, 2024   triamcinolone  cream 0.1 % Commonly known as: KENALOG  APPLY TO AFFECTED AREA TWICE A DAY        Follow-up Information     Rilla Baller, MD Follow up on 04/20/2024.   Specialty: Family Medicine Why: 11:30 for hospital follow up Contact information: [address] [place] [address] [phone]                Discharge Exam: Filed Weights   04/12/24 1308 04/13/24 0326 04/14/24 [phone]  Weight: 65.3 kg 64.9 kg 63.5 kg   BP (!) 123/53 (BP Location: Left Arm)   Pulse 86   Temp 98.5 F (36.9 C) (Oral)   Resp 17   Ht 4' 11 (1.499 m)   Wt 63.5 kg   SpO2 90%   BMI 28.27 kg/m   Patient with no chest pain and no dyspnea, no PND, orthopnea or lower extremity edema. Mild cough   Neurology awake and alert ENT with no pallor Cardiovascular with S1 and S2 present and regular  with no gallops or rubs, no murmurs Respiratory with no rales or wheezing, no rhonchi Abdomen with no distention No lower extremity edema   Condition at discharge: stable  The results of significant diagnostics from this hospitalization (including imaging, microbiology, ancillary and laboratory) are listed below for reference.   Imaging Studies: ECHOCARDIOGRAM COMPLETE Result Date: 04/13/2024    ECHOCARDIOGRAM REPORT   Patient Name:   Priscilla [name] Date of Exam: 04/13/2024 Medical Rec #:  [id number]        Height:       59.0 in Accession #:    [id number]        Weight:       143.1 lb Date of Birth:  May 03, 1930        BSA:          1.600 m Patient Age:    89 years         BP:           111/57 mmHg Patient Gender: F                HR:           68 bpm. Exam Location:  Inpatient Procedure: 2D Echo, Cardiac Doppler and Color Doppler (Both Spectral and Color            Flow Doppler were utilized during procedure). Indications:    Dyspnea and Elevated Troponin  History:        Patient has prior history of Echocardiogram examinations, most                 recent 07/01/2023. Signs/Symptoms:Dyspnea.  Sonographer:    Sherlean Dubin Referring Phys: [id number] AMY N COX IMPRESSIONS  1. Left ventricular ejection fraction, by estimation, is 60 to 65%. The left ventricle has normal function. The left ventricle has no regional wall motion abnormalities. There is mild concentric left ventricular hypertrophy. Left ventricular diastolic parameters were normal.  2. Right ventricular systolic function is normal. The right ventricular size is normal.  3. Left atrial size was severely dilated.  4. Right atrial size was severely dilated.  5. The mitral valve is normal in structure. No evidence of mitral valve regurgitation. No evidence of mitral stenosis.  6. The aortic valve is normal in structure. There is mild calcification of the aortic valve. There is mild thickening of the aortic valve. Aortic valve regurgitation is trivial. Aortic valve sclerosis/calcification is present, without any evidence of aortic stenosis.  7. The inferior vena cava is normal in size with greater than 50% respiratory variability, suggesting right atrial pressure of 3 mmHg. FINDINGS  Left Ventricle: Left ventricular ejection fraction, by estimation, is 60 to 65%. The left ventricle has normal function. The left ventricle has no regional wall motion abnormalities. The left ventricular internal cavity size was normal in size. There is  mild concentric left ventricular hypertrophy. Left ventricular diastolic parameters  were normal. Right Ventricle: The right ventricular size is normal.  No increase in right ventricular wall thickness. Right ventricular systolic function is normal. Left Atrium: Left atrial size was severely dilated. Right Atrium: Right atrial size was severely dilated. Pericardium: There is no evidence of pericardial effusion. Presence of epicardial fat layer. Mitral Valve: The mitral valve is normal in structure. No evidence of mitral valve regurgitation. No evidence of mitral valve stenosis. MV peak gradient, 5.8 mmHg. The mean mitral valve gradient is 3.0 mmHg. Tricuspid Valve: The tricuspid valve is normal in structure. Tricuspid valve regurgitation is not demonstrated. No evidence of tricuspid stenosis. Aortic Valve: The aortic valve is normal in structure. There is mild calcification of the aortic valve. There is mild thickening of the aortic valve. There is moderate aortic valve annular calcification. Aortic valve regurgitation is trivial. Aortic valve sclerosis/calcification is present, without any evidence of aortic stenosis. Aortic valve mean gradient measures 3.0 mmHg. Aortic valve peak gradient measures 4.7 mmHg. Aortic valve area, by VTI measures 2.52 cm. Pulmonic Valve: The pulmonic valve was normal in structure. Pulmonic valve regurgitation is not visualized. No evidence of pulmonic stenosis. Aorta: The aortic root is normal in size and structure. Venous: The inferior vena cava is normal in size with greater than 50% respiratory variability, suggesting right atrial pressure of 3 mmHg. IAS/Shunts: No atrial level shunt detected by color flow Doppler.  LEFT VENTRICLE PLAX 2D LVIDd:         3.70 cm   Diastology LVIDs:         2.70 cm   LV e' medial:    8.76 cm/s LV PW:         1.30 cm   LV E/e' medial:  10.5 LV IVS:        1.10 cm   LV e' lateral:   8.59 cm/s LVOT diam:     2.00 cm   LV E/e' lateral: 10.7 LV SV:         62 LV SV Index:   39 LVOT Area:     3.14 cm  RIGHT VENTRICLE             IVC RV  Basal diam:  3.80 cm     IVC diam: 1.60 cm RV Mid diam:    3.10 cm RV S prime:     10.20 cm/s TAPSE (M-mode): 1.2 cm LEFT ATRIUM             Index        RIGHT ATRIUM           Index LA diam:        4.30 cm 2.69 cm/m   RA Area:     20.70 cm LA Vol (A2C):   80.9 ml 50.57 ml/m  RA Volume:   60.10 ml  37.57 ml/m LA Vol (A4C):   68.7 ml 42.95 ml/m LA Biplane Vol: 75.0 ml 46.89 ml/m  AORTIC VALVE AV Area (Vmax):    2.33 cm AV Area (Vmean):   2.44 cm AV Area (VTI):     2.52 cm AV Vmax:           108.25 cm/s AV Vmean:          74.900 cm/s AV VTI:            0.246 m AV Peak Grad:      4.7 mmHg AV Mean Grad:      3.0 mmHg LVOT Vmax:         80.35 cm/s LVOT Vmean:  58.100 cm/s LVOT VTI:          0.198 m LVOT/AV VTI ratio: 0.80  AORTA Ao Root diam: 3.40 cm Ao Asc diam:  3.00 cm MITRAL VALVE               TRICUSPID VALVE MV Area (PHT): 3.27 cm    TR Peak grad:   4.4 mmHg MV Area VTI:   1.92 cm    TR Vmax:        105.00 cm/s MV Peak grad:  5.8 mmHg MV Mean grad:  3.0 mmHg    SHUNTS MV Vmax:       1.20 m/s    Systemic VTI:  0.20 m MV Vmean:      78.0 cm/s   Systemic Diam: 2.00 cm MV Decel Time: 232 msec MR Peak grad: 39.4 mmHg MR Vmax:      314.00 cm/s MV E velocity: 92.10 cm/s MV A velocity: 97.50 cm/s MV E/A ratio:  0.94 Kardie Tobb DO Electronically signed by Dub Huntsman DO Signature Date/Time: 04/13/2024/2:19:21 PM    Final    CT Angio Chest PE W and/or Wo Contrast Result Date: 04/11/2024 EXAM: CTA CHEST 04/11/2024 04:45:44 PM TECHNIQUE: CTA of the chest was performed after the administration of intravenous contrast. Multiplanar reformatted images are provided for review. MIP images are provided for review. Automated exposure control, iterative reconstruction, and/or weight based adjustment of the mA/kV was utilized to reduce the radiation dose to as low as reasonably achievable. COMPARISON: 12/27/2006 CLINICAL HISTORY: Pulmonary embolism (PE) suspected, low to intermediate prob, positive D-dimer. FINDINGS:  PULMONARY ARTERIES: Pulmonary arteries are adequately opacified for evaluation. No acute pulmonary embolus. Main pulmonary artery is normal in caliber. MEDIASTINUM: The heart and pericardium demonstrate no acute abnormality. Scattered coronary artery and aortic atherosclerosis. There is no acute abnormality of the thoracic aorta. LYMPH NODES: No mediastinal, hilar or axillary lymphadenopathy. LUNGS AND PLEURA: Small bilateral pleural effusions. Compressive atelectasis in the lower lobes. No pneumothorax. UPPER ABDOMEN: Small hiatal hernia. SOFT TISSUES AND BONES: No acute bone or soft tissue abnormality. IMPRESSION: 1. No pulmonary embolism. 2. Small bilateral pleural effusions and compressive atelectasis in the lower lobes. 3. Coronary artery disease, aortic atherosclerosis. Electronically signed by: Franky Crease MD 04/11/2024 05:08 PM EST RP Workstation: [id number]   US  ABDOMEN LIMITED RUQ (LIVER/GB) Result Date: 04/11/2024 EXAM: Right Upper Quadrant Abdominal Ultrasound 04/11/2024 03:48:00 PM TECHNIQUE: Real-time ultrasonography of the right upper quadrant of the abdomen was performed. COMPARISON: US  Abdomen complete 09/13/2012. CLINICAL HISTORY: RUQ pain. FINDINGS: LIVER: Normal echogenicity. No intrahepatic biliary ductal dilatation. No evidence of mass. Hepatopetal flow in the portal vein. BILIARY SYSTEM: Gallbladder wall thickness measures 3.2 mm. No pericholecystic fluid. No cholelithiasis. Common bile duct measures 7.0 mm. RIGHT KIDNEY: No hydronephrosis. No echogenic calculi. No mass. PANCREAS: Visualized portions of the pancreas are unremarkable. OTHER: No right upper quadrant ascites. IMPRESSION: 1. No acute right upper quadrant findings. 2. Borderline gallbladder wall thickening measuring 3.2 mm. 3. Common bile duct measures 7.0 mm which is within normal limits for the patient's age. Electronically signed by: Greig Pique MD 04/11/2024 04:10 PM EST RP Workstation: [id number]   DG Chest 2  View Result Date: 04/11/2024 EXAM: 2 VIEW(S) XRAY OF THE CHEST 04/11/2024 01:10:00 PM COMPARISON: 05/26/2019 CLINICAL HISTORY: sob, cough, flu, FINDINGS: LUNGS AND PLEURA: Mild pulmonary edema. Mild interstitial prominence. New small bilateral pleural effusions. Patchy basilar atelectasis. No pneumothorax. HEART AND MEDIASTINUM: Mild cardiomegaly. No acute abnormality of the mediastinal silhouette. BONES AND  SOFT TISSUES: Interval right shoulder arthroplasty. IMPRESSION: 1. Mild pulmonary edema with interstitial prominence. 2. Small bilateral pleural effusions with patchy basilar atelectasis. 3. Mild cardiomegaly. Electronically signed by: Dayne Hassell MD 04/11/2024 02:26 PM EST RP Workstation: [id number]    Microbiology: Results for orders placed or performed during the hospital encounter of 04/11/24  Resp panel by RT-PCR (RSV, Flu A&B, Covid) Anterior Nasal Swab     Status: None   Collection Time: 04/11/24 11:37 AM   Specimen: Anterior Nasal Swab  Result Value Ref Range Status   SARS Coronavirus 2 by RT PCR NEGATIVE NEGATIVE Final    Comment: (NOTE) SARS-CoV-2 target nucleic acids are NOT DETECTED.  The SARS-CoV-2 RNA is generally detectable in upper respiratory specimens during the acute phase of infection. The lowest concentration of SARS-CoV-2 viral copies this assay can detect is 138 copies/mL. A negative result does not preclude SARS-Cov-2 infection and should not be used as the sole basis for treatment or other patient management decisions. A negative result may occur with  improper specimen collection/handling, submission of specimen other than nasopharyngeal swab, presence of viral mutation(s) within the areas targeted by this assay, and inadequate number of viral copies(<138 copies/mL). A negative result must be combined with clinical observations, patient history, and epidemiological information. The expected result is Negative.  Fact Sheet for Patients:   bloggercourse.com  Fact Sheet for Healthcare Providers:  seriousbroker.it  This test is no t yet approved or cleared by the [place]  FDA and  has been authorized for detection and/or diagnosis of SARS-CoV-2 by FDA under an Emergency Use Authorization (EUA). This EUA will remain  in effect (meaning this test can be used) for the duration of the COVID-19 declaration under Section 564(b)(1) of the Act, 21 U.S.C.section 360bbb-3(b)(1), unless the authorization is terminated  or revoked sooner.       Influenza A by PCR NEGATIVE NEGATIVE Final   Influenza B by PCR NEGATIVE NEGATIVE Final    Comment: (NOTE) The Xpert Xpress SARS-CoV-2/FLU/RSV plus assay is intended as an aid in the diagnosis of influenza from Nasopharyngeal swab specimens and should not be used as a sole basis for treatment. Nasal washings and aspirates are unacceptable for Xpert Xpress SARS-CoV-2/FLU/RSV testing.  Fact Sheet for Patients: bloggercourse.com  Fact Sheet for Healthcare Providers: seriousbroker.it  This test is not yet approved or cleared by the [place]  FDA and has been authorized for detection and/or diagnosis of SARS-CoV-2 by FDA under an Emergency Use Authorization (EUA). This EUA will remain in effect (meaning this test can be used) for the duration of the COVID-19 declaration under Section 564(b)(1) of the Act, 21 U.S.C. section 360bbb-3(b)(1), unless the authorization is terminated or revoked.     Resp Syncytial Virus by PCR NEGATIVE NEGATIVE Final    Comment: (NOTE) Fact Sheet for Patients: bloggercourse.com  Fact Sheet for Healthcare Providers: seriousbroker.it  This test is not yet approved or cleared by the [place]  FDA and has been authorized for detection and/or diagnosis of SARS-CoV-2 by FDA under an Emergency Use  Authorization (EUA). This EUA will remain in effect (meaning this test can be used) for the duration of the COVID-19 declaration under Section 564(b)(1) of the Act, 21 U.S.C. section 360bbb-3(b)(1), unless the authorization is terminated or revoked.  Performed at [facility], [address], [place], [place] [address]     Labs: CBC: Recent Labs  Lab 04/11/24 1154 04/13/24 0257  WBC 7.2 5.8  HGB 10.6* 9.8*  HCT 32.2* 30.1*  MCV 89.0 90.1  PLT 519* 451*   Basic Metabolic Panel: Recent Labs  Lab 04/11/24 1300 04/13/24 0257 04/14/24 0234  NA 140 137 136  K 4.5 3.9 4.0  CL 104 100 101  CO2 24 26 21*  GLUCOSE 103* 91 93  BUN 14 21 27*  CREATININE 0.75 0.92 0.95  CALCIUM  9.9 8.9 9.0  MG  --   --  2.0   Liver Function Tests: Recent Labs  Lab 04/11/24 1300  AST 19  ALT 14  ALKPHOS 102  BILITOT 0.4  PROT 6.7  ALBUMIN 3.8   CBG: No results for input(s): GLUCAP in the last 168 hours.  Discharge time spent: greater than 30 minutes.  Signed: Elidia Toribio Furnace, MD [facility] 04/14/2024 "

## 2024-04-14 NOTE — Progress Notes (Signed)
 "  Nursing Discharge Note   Name: Priscilla [name] MRN: [id number] DOB: [birth year]    Admit Date:  04/11/2024  Discharge Date:  04/14/2024   Priscilla [name] is to be discharged home per MD order.  AVS completed. Reviewed with patient and family at bedside. Highlighted copy provided for patient to take home.  Patient/caregiver able to verbalize understanding of discharge instructions. PIV removed. Patient stable upon discharge.   Discharge Instructions   None      Discharge Instructions     Discharge instructions   Complete by: As directed    Please weight every morning before breakfast, in case of weight gain 2 to 3 lbs in 24 hrs or 5 lbs in 7 days, take furosemide  as instructed until weight back to baseline   Discharge instructions   Complete by: As directed    Please weight every morning before breakfast, in case of weight gain 2 to 3 lbs in 24 hrs or 5 lbs in 7 days please take furosemide  as instructed until weight back to baseline.  Follow up with primary care in 7 to 10 days   Increase activity slowly   Complete by: As directed    Increase activity slowly   Complete by: As directed         Allergies as of 04/14/2024       Reactions   Morphine And Codeine Nausea And Vomiting   Lisinopril Cough   Losartan  Cough        Medication List     STOP taking these medications    amoxicillin -clavulanate 875-125 MG tablet Commonly known as: AUGMENTIN    hydrALAZINE  25 MG tablet Commonly known as: APRESOLINE    nitrofurantoin  (macrocrystal-monohydrate) 100 MG capsule Commonly known as: MACROBID    nitroGLYCERIN  0.4 MG SL tablet Commonly known as: NITROSTAT    oxybutynin  5 MG tablet Commonly known as: DITROPAN    sulfamethoxazole -trimethoprim  800-160 MG tablet Commonly known as: BACTRIM  DS       TAKE these medications    acetaminophen  500 MG tablet Commonly known as: TYLENOL  Take 500-1,000 mg by mouth every 6 (six) hours as needed for moderate pain or  headache.   amLODipine  5 MG tablet Commonly known as: NORVASC  TAKE 1 TABLET (5 MG TOTAL) BY MOUTH DAILY.   aspirin  EC 81 MG tablet Take 81 mg by mouth daily.   atenolol  25 MG tablet Commonly known as: TENORMIN  Take 1 tablet (25 mg total) by mouth 2 (two) times daily. What changed: when to take this   B-12 1000 MCG Subl Place 1 tablet under the tongue every Monday, Wednesday, and Friday.   CALCIUM  600 PO Take 1 tablet by mouth at bedtime.   D3-50 1.25 MG (50000 UT) capsule Generic drug: Cholecalciferol TAKE 1 CAPSULE BY MOUTH ONE TIME PER WEEK What changed: See the new instructions.   ferrous sulfate  325 (65 FE) MG tablet Take 1 tablet (325 mg total) by mouth every other day.   furosemide  20 MG tablet Commonly known as: LASIX  Take 1 tablet (20 mg total) by mouth daily as needed for edema or fluid (as needed in case of weight gain 2 to 3 lbs in 24 hrs or 5 lbs in 7 days).   meloxicam 7.5 MG tablet Commonly known as: MOBIC Take 7.5 mg by mouth daily as needed for pain.   omeprazole  20 MG capsule Commonly known as: PRILOSEC TAKE 1 CAPSULE BY MOUTH EVERY DAY   spironolactone  25 MG tablet Commonly known as: ALDACTONE  Take 0.5 tablets (12.5  mg total) by mouth daily. Start taking on: April 15, 2024   triamcinolone  cream 0.1 % Commonly known as: KENALOG  APPLY TO AFFECTED AREA TWICE A DAY         Discharge Instructions/ Education: An After Visit Summary was printed and given to the patient. Discharge instructions given to patient/family with verbalized understanding. Discharge education completed with patient/family including: follow up instructions, medication list, discharge activities, and limitations if indicated.  Additional discharge instructions as indicated by discharging provider also reviewed.  Patient and family able to verbalize understanding, all questions fully answered. Patient instructed to return to Emergency Department, call 911, or call MD for any  changes in condition.   Patient escorted via wheelchair to lobby and discharged home via private automobile.   "

## 2024-04-14 NOTE — TOC Transition Note (Signed)
 Transition of Care [facility]) - Discharge Note   Patient Details  Name: Priscilla [name] MRN: [id number] Date of Birth: March 28, 1930  Transition of Care [facility] CM/SW Contact:  Waddell Barnie Rama, RN Phone Number: 04/14/2024, 10:53 AM   Clinical Narrative:    For dc today, she has transportation and she has a hospital follow up on AVS.         Patient Goals and CMS Choice            Discharge Placement                       Discharge Plan and Services Additional resources added to the After Visit Summary for                                       Social Drivers of Health (SDOH) Interventions SDOH Screenings   Food Insecurity: No Food Insecurity (04/12/2024)  Housing: Unknown (04/12/2024)  Transportation Needs: No Transportation Needs (04/12/2024)  Utilities: Not At Risk (04/12/2024)  Alcohol Screen: Low Risk (02/17/2023)  Depression (PHQ2-9): Low Risk (12/08/2023)  Financial Resource Strain: Low Risk (02/17/2023)  Physical Activity: Insufficiently Active (02/17/2023)  Social Connections: Moderately Integrated (04/12/2024)  Stress: No Stress Concern Present (02/17/2023)  Tobacco Use: Low Risk (04/12/2024)  Health Literacy: Adequate Health Literacy (02/17/2023)     Readmission Risk Interventions    04/14/2024   10:49 AM  Readmission Risk Prevention Plan  Post Dischage Appt Complete  Medication Screening Complete  Transportation Screening Complete

## 2024-04-14 NOTE — Progress Notes (Signed)
 Heart Failure Navigator Progress Note  Assessed for Heart & Vascular TOC clinic readiness.  Patient does not meet criteria, per Dr. Arrien, will follow up with PCP in 1 week.   Navigator available for reassessment of patient.   Priscilla [name], PharmD, BCPS Heart Failure Stewardship Pharmacist Phone [phone]

## 2024-04-14 NOTE — Evaluation (Addendum)
 Physical Therapy Evaluation Patient Details Name: Priscilla [name] MRN: [id number] DOB: 02/09/1931 Today's Date: 04/14/2024  History of Present Illness  Pt is a 89 y.o female presented to [facility] ED 1/19 for cough and flu like symptoms, dx with UTI and flu 2 weeks prior. Admitted for CHF exacerbation. PMH: DVT, c-diff, SBO, carotid stenosis, colon cancer, OA, HTN  Clinical Impression  PTA pt would furniture surf in the home and use SP cane in the community. Pt reported being close to if not at mobility baseline being supervision/ModI for all mobility. Pt was interested in trialing rollator with education provided on safety and technique. After ambulating with rollator, pt reported she preferred using a SP cane, however, has access to a rollator if she changes her mind. Pt has no further acute or post acute PT needs with necessary assist available at home. Acute PT signing off. Please re-consult if there is a change in status.         If plan is discharge home, recommend the following: Assist for transportation;Help with stairs or ramp for entrance   Can travel by private vehicle    Yes    Equipment Recommendations None recommended by PT     Functional Status Assessment Patient has not had a recent decline in their functional status     Precautions / Restrictions Precautions Precautions: Fall Recall of Precautions/Restrictions: Intact Restrictions Weight Bearing Restrictions Per Provider Order: No      Mobility  Bed Mobility    General bed mobility comments: Received in recliner    Transfers Overall transfer level: Needs assistance Equipment used: None, Rollator (4 wheels) Transfers: Sit to/from Stand Sit to Stand: Modified independent (Device/Increase time)    General transfer comment: able to stand with no AD and with rollator at ModI level    Ambulation/Gait Ambulation/Gait assistance: Modified independent (Device/Increase time), Supervision Gait Distance (Feet):  200 Feet Assistive device: Rollator (4 wheels), None Gait Pattern/deviations: Step-through pattern, Decreased stride length Gait velocity: decr    General Gait Details: able to ambulate short distances with no UE support. Trialed rollator with education provided on safety and technique     Balance Overall balance assessment: Needs assistance Sitting-balance support: Feet supported, No upper extremity supported Sitting balance-Leahy Scale: Normal     Standing balance support: No upper extremity supported Standing balance-Leahy Scale: Fair Standing balance comment: able to stand statically with no UE support, preferred UE support when ambulating       Pertinent Vitals/Pain Pain Assessment Pain Assessment: No/denies pain    Home Living Family/patient expects to be discharged to:: Private residence Living Arrangements: Alone Available Help at Discharge: Family;Available PRN/intermittently Type of Home: House Home Access: Stairs to enter Entrance Stairs-Rails: Can reach both Entrance Stairs-Number of Steps: 1   Home Layout: One level Home Equipment: Cane - single Librarian, Academic (2 wheels);Shower seat      Prior Function Prior Level of Function : Independent/Modified Independent    Mobility Comments: SPC in community, furniture surfs in the home ADLs Comments: Mod I, driving, does IADLs     Extremity/Trunk Assessment   Upper Extremity Assessment Upper Extremity Assessment: Defer to OT evaluation    Lower Extremity Assessment Lower Extremity Assessment: Generalized weakness    Cervical / Trunk Assessment Cervical / Trunk Assessment: Kyphotic  Communication   Communication Communication: No apparent difficulties    Cognition Arousal: Alert Behavior During Therapy: WFL for tasks assessed/performed   PT - Cognitive impairments: No apparent impairments    Following commands:  Intact       Cueing Cueing Techniques: Verbal cues     General Comments  General comments (skin integrity, edema, etc.): VSS on RA     PT Assessment Patient does not need any further PT services         PT Goals (Current goals can be found in the Care Plan section)  Acute Rehab PT Goals PT Goal Formulation: All assessment and education complete, DC therapy     AM-PAC PT 6 Clicks Mobility  Outcome Measure Help needed turning from your back to your side while in a flat bed without using bedrails?: None Help needed moving from lying on your back to sitting on the side of a flat bed without using bedrails?: None Help needed moving to and from a bed to a chair (including a wheelchair)?: None Help needed standing up from a chair using your arms (e.g., wheelchair or bedside chair)?: None Help needed to walk in hospital room?: A Little Help needed climbing 3-5 steps with a railing? : A Little 6 Click Score: 22    End of Session   Activity Tolerance: Patient tolerated treatment well Patient left: in chair;with call bell/phone within reach Nurse Communication: Mobility status PT Visit Diagnosis: Other abnormalities of gait and mobility (R26.89)    Time: 0812-0827 PT Time Calculation (min) (ACUTE ONLY): 15 min   Charges:   PT Evaluation $PT Eval Low Complexity: 1 Low   PT General Charges $$ ACUTE PT VISIT: 1 Visit       Kate ORN, PT, DPT Secure Chat Preferred  Rehab Office [phone]   Kate BRAVO Wendolyn 04/14/2024, 9:14 AM

## 2024-04-14 NOTE — Assessment & Plan Note (Signed)
 Follow up as outpatient

## 2024-04-14 NOTE — Plan of Care (Signed)
" °  Problem: Education: Goal: Knowledge of General Education information will improve Description: Including pain rating scale, medication(s)/side effects and non-pharmacologic comfort measures 04/14/2024 1351 by Niel Porter ORN, RN Outcome: Adequate for Discharge 04/14/2024 1351 by Niel Porter ORN, RN Reactivated 04/14/2024 1133 by Niel Porter ORN, RN Outcome: Adequate for Discharge   Problem: Health Behavior/Discharge Planning: Goal: Ability to manage health-related needs will improve 04/14/2024 1351 by Niel Porter ORN, RN Outcome: Adequate for Discharge 04/14/2024 1351 by Niel Porter ORN, RN Reactivated 04/14/2024 1133 by Niel Porter ORN, RN Outcome: Adequate for Discharge   Problem: Clinical Measurements: Goal: Ability to maintain clinical measurements within normal limits will improve Outcome: Adequate for Discharge Goal: Will remain free from infection Outcome: Adequate for Discharge Goal: Diagnostic test results will improve Outcome: Adequate for Discharge Goal: Respiratory complications will improve Outcome: Adequate for Discharge Goal: Cardiovascular complication will be avoided Outcome: Adequate for Discharge   Problem: Activity: Goal: Risk for activity intolerance will decrease Outcome: Adequate for Discharge   Problem: Nutrition: Goal: Adequate nutrition will be maintained Outcome: Adequate for Discharge   Problem: Coping: Goal: Level of anxiety will decrease Outcome: Adequate for Discharge   Problem: Elimination: Goal: Will not experience complications related to bowel motility Outcome: Adequate for Discharge Goal: Will not experience complications related to urinary retention Outcome: Adequate for Discharge   Problem: Pain Managment: Goal: General experience of comfort will improve and/or be controlled Outcome: Adequate for Discharge   Problem: Safety: Goal: Ability to remain free from injury will improve Outcome: Adequate for Discharge   Problem: Skin  Integrity: Goal: Risk for impaired skin integrity will decrease Outcome: Adequate for Discharge   Problem: Education: Goal: Ability to demonstrate management of disease process will improve Outcome: Adequate for Discharge Goal: Ability to verbalize understanding of medication therapies will improve Outcome: Adequate for Discharge Goal: Individualized Educational Video(s) Outcome: Adequate for Discharge   Problem: Activity: Goal: Capacity to carry out activities will improve Outcome: Adequate for Discharge   Problem: Cardiac: Goal: Ability to achieve and maintain adequate cardiopulmonary perfusion will improve Outcome: Adequate for Discharge   Problem: Health Behavior/Discharge Planning: Goal: Ability to manage health-related needs will improve 04/14/2024 1351 by Niel Porter ORN, RN Outcome: Adequate for Discharge 04/14/2024 1351 by Niel Porter ORN, RN Reactivated 04/14/2024 1133 by Niel Porter ORN, RN Outcome: Adequate for Discharge   "

## 2024-04-14 NOTE — Assessment & Plan Note (Addendum)
 Continue blood pressure control with amlodipine  and atenolol   Added spironolactone 

## 2024-04-14 NOTE — Evaluation (Signed)
 Occupational Therapy Evaluation Patient Details Name: Priscilla [name] MRN: [id number] DOB: Jan 31, 1931 Today's Date: 04/14/2024   History of Present Illness   Pt is a 89 y.o female presented to [facility] ED 1/19 for cough and flu like symptoms, dx with UTI and flu 2 weeks prior. Admitted for CHF exacerbation. PMH: DVT, c-diff, SBO, carotid stenosis, colon cancer, OA, HTN     Clinical Impressions Pt admitted based on above, and was seen based on problem list below. PTA pt was mod I with ADLs and IADLs. Today pt is at her baseline for ADLs. Pt completed LB dressing, standing grooming tasks, and toilet transfers at mod I. Hallway mobility completed with supervision and HH assist. Educated pt on use of rollator for community mobility, but will defer to PT for further recs. Pt at her functional baseline for ADLs, no follow up OT needs. All education complete, no further acute OT needs, OT is signing off on this pt.     If plan is discharge home, recommend the following:   Assistance with cooking/housework;Assist for transportation     Functional Status Assessment   Patient has not had a recent decline in their functional status     Equipment Recommendations   None recommended by OT      Precautions/Restrictions   Precautions Precautions: Fall Recall of Precautions/Restrictions: Intact Restrictions Weight Bearing Restrictions Per Provider Order: No     Mobility Bed Mobility     General bed mobility comments: Received in recliner    Transfers Overall transfer level: Needs assistance Equipment used: 1 person hand held assist Transfers: Sit to/from Stand Sit to Stand: Supervision           General transfer comment: Use of HH assist for hallway mobility (uses SPC at baseline) at supervision level      Balance Overall balance assessment: Mild deficits observed, not formally tested       ADL either performed or assessed with clinical judgement   ADL Overall  ADL's : Modified independent;At baseline   General ADL Comments: Standing ADLs, LB dressing at supervision to mod I     Vision Baseline Vision/History: 0 No visual deficits Vision Assessment?: No apparent visual deficits            Pertinent Vitals/Pain Pain Assessment Pain Assessment: No/denies pain     Extremity/Trunk Assessment Upper Extremity Assessment Upper Extremity Assessment: Generalized weakness   Lower Extremity Assessment Lower Extremity Assessment: Defer to PT evaluation   Cervical / Trunk Assessment Cervical / Trunk Assessment: Kyphotic   Communication Communication Communication: No apparent difficulties   Cognition Arousal: Alert Behavior During Therapy: WFL for tasks assessed/performed Cognition: No apparent impairments     Following commands: Intact       Cueing  General Comments   Cueing Techniques: Verbal cues  VSS on RA           Home Living Family/patient expects to be discharged to:: Private residence Living Arrangements: Alone Available Help at Discharge: Family;Available PRN/intermittently Type of Home: House Home Access: Stairs to enter [employer] of Steps: 1 Entrance Stairs-Rails: Can reach both Home Layout: One level     Bathroom Shower/Tub: Chief Strategy Officer: Handicapped height     Home Equipment: Cane - single Librarian, Academic (2 wheels);Shower seat          Prior Functioning/Environment Prior Level of Function : Independent/Modified Independent       Mobility Comments: SPC in community, no AD in home ADLs Comments: Mod  I, driving, does IADLs    OT Problem List: Decreased activity tolerance;Decreased strength;Cardiopulmonary status limiting activity        OT Goals(Current goals can be found in the care plan section)   Acute Rehab OT Goals Patient Stated Goal: To go home OT Goal Formulation: All assessment and education complete, DC therapy Time For Goal Achievement:  04/28/24 Potential to Achieve Goals: Good   AM-PAC OT 6 Clicks Daily Activity     Outcome Measure Help from another person eating meals?: None Help from another person taking care of personal grooming?: None Help from another person toileting, which includes using toliet, bedpan, or urinal?: None Help from another person bathing (including washing, rinsing, drying)?: None Help from another person to put on and taking off regular upper body clothing?: None Help from another person to put on and taking off regular lower body clothing?: None 6 Click Score: 24   End of Session Equipment Utilized During Treatment: Gait belt Nurse Communication: Mobility status  Activity Tolerance: Patient tolerated treatment well Patient left: in chair;with call bell/phone within reach  OT Visit Diagnosis: Muscle weakness (generalized) (M62.81)                Time: 9252-9194 OT Time Calculation (min): 18 min Charges:  OT General Charges $OT Visit: 1 Visit OT Evaluation $OT Eval Low Complexity: 1 Low  Adrianne BROCKS, OT  Acute Rehabilitation Services Office [phone] Secure chat preferred   Adrianne GORMAN Savers 04/14/2024, 8:44 AM

## 2024-04-14 NOTE — Plan of Care (Signed)
 Discharging home with family today.  Medications reviewed for discharge home.   Problem: Education: Goal: Knowledge of General Education information will improve Description: Including pain rating scale, medication(s)/side effects and non-pharmacologic comfort measures Outcome: Adequate for Discharge   Problem: Health Behavior/Discharge Planning: Goal: Ability to manage health-related needs will improve Outcome: Adequate for Discharge   Problem: Clinical Measurements: Goal: Ability to maintain clinical measurements within normal limits will improve Outcome: Adequate for Discharge Goal: Will remain free from infection Outcome: Adequate for Discharge Goal: Diagnostic test results will improve Outcome: Adequate for Discharge Goal: Respiratory complications will improve Outcome: Adequate for Discharge Goal: Cardiovascular complication will be avoided Outcome: Adequate for Discharge   Problem: Activity: Goal: Risk for activity intolerance will decrease Outcome: Adequate for Discharge   Problem: Nutrition: Goal: Adequate nutrition will be maintained Outcome: Adequate for Discharge   Problem: Coping: Goal: Level of anxiety will decrease Outcome: Adequate for Discharge   Problem: Elimination: Goal: Will not experience complications related to bowel motility Outcome: Adequate for Discharge Goal: Will not experience complications related to urinary retention Outcome: Adequate for Discharge   Problem: Pain Managment: Goal: General experience of comfort will improve and/or be controlled Outcome: Adequate for Discharge   Problem: Safety: Goal: Ability to remain free from injury will improve Outcome: Adequate for Discharge   Problem: Skin Integrity: Goal: Risk for impaired skin integrity will decrease Outcome: Adequate for Discharge   Problem: Education: Goal: Ability to demonstrate management of disease process will improve Outcome: Adequate for Discharge Goal: Ability to  verbalize understanding of medication therapies will improve Outcome: Adequate for Discharge Goal: Individualized Educational Video(s) Outcome: Adequate for Discharge   Problem: Activity: Goal: Capacity to carry out activities will improve Outcome: Adequate for Discharge   Problem: Cardiac: Goal: Ability to achieve and maintain adequate cardiopulmonary perfusion will improve Outcome: Adequate for Discharge

## 2024-04-15 ENCOUNTER — Telehealth: Payer: Self-pay

## 2024-04-15 NOTE — Transitions of Care (Post Inpatient/ED Visit) (Signed)
" ° °  04/15/2024  Name: Priscilla [name] MRN: [id number] DOB: 08-Jun-1930  Today's TOC FU Call Status: Today's TOC FU Call Status:: Successful TOC FU Call Completed (S/W patient briefly who states she & her daughter understand all of her medications and they were reviewed at the hospital, she has hospital f/u scheduled and has all of the support she needs - patient states she has a digital scale and will weigh daily) TOC FU Call Complete Date: 04/15/24 (Patient states she knows when to call MD with weight gain, sweling, shortness of breath and has already removed meds from pill box that she is to stop and has started new med - declined TOC)  Patient's Name and Date of Birth confirmed. DOB, Name  Transition Care Management Follow-up Telephone Call Date of Discharge: 04/14/24 Discharge Facility: [facility]) Type of Discharge: Inpatient Admission Primary Inpatient Discharge Diagnosis:: Congestive heart failure How have you been since you were released from the hospital?: Better  Items Reviewed: Did you receive and understand the discharge instructions provided?: Yes    Follow up appointments reviewed: PCP Follow-up appointment confirmed?: Yes Date of PCP follow-up appointment?: 04/20/24 Follow-up Provider: PCP, Dr Rilla Do you need transportation to your follow-up appointment?: No Do you understand care options if your condition(s) worsen?: Yes-patient verbalized understanding    Shona Prow RN, CCM [place]  VBCI-Population Health RN Care Manager [phone]  "

## 2024-04-20 ENCOUNTER — Ambulatory Visit: Admitting: Family Medicine

## 2024-04-20 ENCOUNTER — Encounter: Payer: Self-pay | Admitting: Family Medicine

## 2024-04-20 VITALS — BP 138/50 | HR 72 | Temp 98.6°F | Ht 59.0 in | Wt 138.0 lb

## 2024-04-20 DIAGNOSIS — Z85038 Personal history of other malignant neoplasm of large intestine: Secondary | ICD-10-CM

## 2024-04-20 DIAGNOSIS — E559 Vitamin D deficiency, unspecified: Secondary | ICD-10-CM | POA: Diagnosis not present

## 2024-04-20 DIAGNOSIS — Z85048 Personal history of other malignant neoplasm of rectum, rectosigmoid junction, and anus: Secondary | ICD-10-CM

## 2024-04-20 DIAGNOSIS — D649 Anemia, unspecified: Secondary | ICD-10-CM

## 2024-04-20 DIAGNOSIS — Z9049 Acquired absence of other specified parts of digestive tract: Secondary | ICD-10-CM

## 2024-04-20 DIAGNOSIS — R110 Nausea: Secondary | ICD-10-CM | POA: Diagnosis not present

## 2024-04-20 DIAGNOSIS — I10 Essential (primary) hypertension: Secondary | ICD-10-CM

## 2024-04-20 DIAGNOSIS — D539 Nutritional anemia, unspecified: Secondary | ICD-10-CM

## 2024-04-20 DIAGNOSIS — I5033 Acute on chronic diastolic (congestive) heart failure: Secondary | ICD-10-CM

## 2024-04-20 DIAGNOSIS — R252 Cramp and spasm: Secondary | ICD-10-CM

## 2024-04-20 LAB — IBC PANEL
Iron: 77 ug/dL (ref 42–145)
Saturation Ratios: 20.3 % (ref 20.0–50.0)
TIBC: 379.4 ug/dL (ref 250.0–450.0)
Transferrin: 271 mg/dL (ref 212.0–360.0)

## 2024-04-20 LAB — CBC WITH DIFFERENTIAL/PLATELET
Basophils Absolute: 0.1 10*3/uL (ref 0.0–0.1)
Basophils Relative: 2 % (ref 0.0–3.0)
Eosinophils Absolute: 0.1 10*3/uL (ref 0.0–0.7)
Eosinophils Relative: 1.8 % (ref 0.0–5.0)
HCT: 33.9 % — ABNORMAL LOW (ref 36.0–46.0)
Hemoglobin: 11.4 g/dL — ABNORMAL LOW (ref 12.0–15.0)
Lymphocytes Relative: 26.6 % (ref 12.0–46.0)
Lymphs Abs: 1.5 10*3/uL (ref 0.7–4.0)
MCHC: 33.8 g/dL (ref 30.0–36.0)
MCV: 87.9 fl (ref 78.0–100.0)
Monocytes Absolute: 0.7 10*3/uL (ref 0.1–1.0)
Monocytes Relative: 13.1 % — ABNORMAL HIGH (ref 3.0–12.0)
Neutro Abs: 3.2 10*3/uL (ref 1.4–7.7)
Neutrophils Relative %: 56.5 % (ref 43.0–77.0)
Platelets: 417 10*3/uL — ABNORMAL HIGH (ref 150.0–400.0)
RBC: 3.85 Mil/uL — ABNORMAL LOW (ref 3.87–5.11)
RDW: 13.4 % (ref 11.5–15.5)
WBC: 5.6 10*3/uL (ref 4.0–10.5)

## 2024-04-20 LAB — BASIC METABOLIC PANEL WITH GFR
BUN: 26 mg/dL — ABNORMAL HIGH (ref 6–23)
CO2: 26 meq/L (ref 19–32)
Calcium: 9.6 mg/dL (ref 8.4–10.5)
Chloride: 101 meq/L (ref 96–112)
Creatinine, Ser: 1 mg/dL (ref 0.40–1.20)
GFR: 49.31 mL/min — ABNORMAL LOW
Glucose, Bld: 104 mg/dL — ABNORMAL HIGH (ref 70–99)
Potassium: 4.7 meq/L (ref 3.5–5.1)
Sodium: 134 meq/L — ABNORMAL LOW (ref 135–145)

## 2024-04-20 LAB — MAGNESIUM: Magnesium: 2 mg/dL (ref 1.5–2.5)

## 2024-04-20 MED ORDER — ONDANSETRON HCL 4 MG PO TABS: 4.0000 mg | ORAL_TABLET | Freq: Three times a day (TID) | ORAL | 0 refills | Status: AC | PRN

## 2024-04-20 MED ORDER — VITAMIN D3 50 MCG (2000 UT) PO CAPS: 2000.0000 [IU] | ORAL_CAPSULE | Freq: Every day | ORAL | Status: AC

## 2024-04-20 NOTE — Patient Instructions (Addendum)
 Labs today  May try zofran  (ondansetron ) 4mg  tablets as needed for nausea  Start vitamin D3 2000 units daily.  Good to see you today Continue new medicine spironolactone  25mg  daily.  Monitor weight and blood pressures at home, let me know if running too high  Return in 4-6 months for physical

## 2024-04-20 NOTE — Progress Notes (Unsigned)
 " Ph: [phone] Fax: [phone]   Patient ID: Priscilla [name], female    DOB: 12/05/1930, 89 y.o.   MRN: [id number]  This visit was conducted in person.  BP (!) 144/62   Pulse 72   Temp 98.6 F (37 C) (Oral)   Ht 4' 11 (1.499 m)   Wt 138 lb (62.6 kg)   SpO2 96%   BMI 27.87 kg/m   BP Readings from Last 3 Encounters:  04/20/24 (!) 144/62  04/14/24 (!) 123/53  12/08/23 (!) 158/60     CC: hosp f/u visit  Subjective:   HPI: Priscilla [name] is a 89 y.o. female presenting on 04/20/2024 for Hospitalization Follow-up   Recent hospitalization for shortness of breath and leg swelling. She noted 2 weeks of progressive symptoms associated with a productive cough. Initially treated at urgent care with influenza and UTI with augmentin , tamiflu, macrobid .  Hospital records reviewed. Med rec performed.  BNP markedly high at 2794.  CXR showed CM, hilar vascular congestion, interstitial infiltrates, B pleural effusions.  CTA chest - no PE.  Diuresed with IV lasix  with -2.4 L output.  Started on spironolactone , and prn furosemide .  Echocardiogram - preserved LV systolic function with EF 60-65%. Mild LVH, severely dilated LA/RA without significant valvular dysfunction.  Hydralazine  was stopped.   BP at home 140-150s/50s.   She has started weight each morning and weight is around 138lbs.  Notes leg cramping may be some better since starting spironolactone .  Notes some nausea more recently of unsure cause - may have been when she takes meds on empty stomach.   Newly noted anemia - denies blood in stool or urine  She has been on oral iron  for a couple of years.   Home health not set up.  Other follow up appointments scheduled: none ______________________________________________________________________ Hospital admission: 04/11/2024 Hospital discharge: 04/14/2024 TCM f/u phone call:  performed on 04/15/2024  Recommendations at discharge:   Patient has been placed on  spironolactone  for RAAS inhibition, continue atenolol , no ARB due to cough related side effect. No SGLT 2 inh due to recent urine infection  Loop diuretic as needed in case of volume overload, weight gain 2 to 3 lbs in 24 hrs or 5 lbs in 7 days.  Follow up renal function and electrolytes in 7 days as outpatient Follow up with Dr Rilla in 7 to 10 days    Discharge Diagnoses: Principal Problem:   Congestive heart failure (HCC) Active Problems:   DOE (dyspnea on exertion)   Cough   GERD (gastroesophageal reflux disease)   Osteoarthritis   History of colon cancer   History of rectal cancer   Aortic insufficiency   History of colon resection   Fluid overload   Upper respiratory tract infection     Relevant past medical, surgical, family and social history reviewed and updated as indicated. Interim medical history since our last visit reviewed. Allergies and medications reviewed and updated. Outpatient Medications Prior to Visit  Medication Sig Dispense Refill   acetaminophen  (TYLENOL ) 500 MG tablet Take 500-1,000 mg by mouth every 6 (six) hours as needed for moderate pain or headache.     amLODipine  (NORVASC ) 5 MG tablet TAKE 1 TABLET (5 MG TOTAL) BY MOUTH DAILY. 90 tablet 0   aspirin  EC 81 MG tablet Take 81 mg by mouth daily.     atenolol  (TENORMIN ) 25 MG tablet Take 1 tablet (25 mg total) by mouth 2 (two) times daily. (Patient taking differently: Take 25 mg  by mouth daily.) 180 tablet 4   Calcium  Carbonate (CALCIUM  600 PO) Take 1 tablet by mouth at bedtime.     Cyanocobalamin  (B-12) 1000 MCG SUBL Place 1 tablet under the tongue every Monday, Wednesday, and Friday.     ferrous sulfate  325 (65 FE) MG tablet Take 1 tablet (325 mg total) by mouth every other day.     furosemide  (LASIX ) 20 MG tablet Take 1 tablet (20 mg total) by mouth daily as needed for edema or fluid (as needed in case of weight gain 2 to 3 lbs in 24 hrs or 5 lbs in 7 days). 30 tablet 0   meloxicam (MOBIC) 7.5 MG  tablet Take 7.5 mg by mouth daily as needed for pain.     omeprazole  (PRILOSEC) 20 MG capsule TAKE 1 CAPSULE BY MOUTH EVERY DAY 90 capsule 0   spironolactone  (ALDACTONE ) 25 MG tablet Take 0.5 tablets (12.5 mg total) by mouth daily. 15 tablet 0   triamcinolone  cream (KENALOG ) 0.1 % APPLY TO AFFECTED AREA TWICE A DAY 30 g 0   Cholecalciferol (D3-50) 1.25 MG (50000 UT) capsule TAKE 1 CAPSULE BY MOUTH ONE TIME PER WEEK (Patient taking differently: 1 capsule by mouth every other week) 12 capsule 1   CALCIUM -VITAMIN D  PO Take 1 tablet by mouth 2 (two) times daily.     No facility-administered medications prior to visit.     Per HPI unless specifically indicated in ROS section below Review of Systems  Objective:  BP (!) 144/62   Pulse 72   Temp 98.6 F (37 C) (Oral)   Ht 4' 11 (1.499 m)   Wt 138 lb (62.6 kg)   SpO2 96%   BMI 27.87 kg/m   Wt Readings from Last 3 Encounters:  04/20/24 138 lb (62.6 kg)  04/14/24 139 lb 15.9 oz (63.5 kg)  12/08/23 148 lb 4 oz (67.2 kg)      Physical Exam Vitals and nursing note reviewed.  Constitutional:      Appearance: Normal appearance. She is not ill-appearing.     Comments: Ambulates with cane   HENT:     Head: Normocephalic and atraumatic.     Mouth/Throat:     Mouth: Mucous membranes are moist.     Pharynx: Oropharynx is clear. No oropharyngeal exudate or posterior oropharyngeal erythema.  Eyes:     Extraocular Movements: Extraocular movements intact.     Pupils: Pupils are equal, round, and reactive to light.  Cardiovascular:     Rate and Rhythm: Normal rate and regular rhythm.     Pulses: Normal pulses.     Heart sounds: Normal heart sounds. No murmur heard. Pulmonary:     Effort: Pulmonary effort is normal. No respiratory distress.     Breath sounds: Normal breath sounds. No wheezing, rhonchi or rales.  Musculoskeletal:     Right lower leg: No edema.     Left lower leg: No edema.  Skin:    General: Skin is warm and dry.      Findings: No rash.  Neurological:     Mental Status: She is alert.  Psychiatric:        Mood and Affect: Mood normal.        Behavior: Behavior normal.       Lab Results  Component Value Date   NA 136 04/14/2024   CL 101 04/14/2024   K 4.0 04/14/2024   CO2 21 (L) 04/14/2024   BUN 27 (H) 04/14/2024   CREATININE 0.95 04/14/2024  GFRNONAA 56 (L) 04/14/2024   CALCIUM  9.0 04/14/2024   PHOS 3.6 08/13/2021   ALBUMIN 3.8 04/11/2024   GLUCOSE 93 04/14/2024   Lab Results  Component Value Date   VD25OH 60.88 02/25/2023   Lab Results  Component Value Date   VITAMINB12 813 02/25/2023    Lab Results  Component Value Date   IRON  118 02/25/2023   TIBC 397.6 02/25/2023   FERRITIN 75.4 02/25/2023    Assessment & Plan:   Problem List Items Addressed This Visit     Vitamin D  deficiency   Relevant Orders   VITAMIN D  25 Hydroxy (Vit-D Deficiency, Fractures)   Other Visit Diagnoses       Acute on chronic heart failure with preserved ejection fraction (HFpEF) (HCC)    -  Primary   Relevant Orders   Basic metabolic panel with GFR   Magnesium    CBC with Differential        Meds ordered this encounter  Medications   Cholecalciferol (VITAMIN D3) 50 MCG (2000 UT) capsule    Sig: Take 1 capsule (2,000 Units total) by mouth daily.    Orders Placed This Encounter  Procedures   Basic metabolic panel with GFR   Magnesium    CBC with Differential   VITAMIN D  25 Hydroxy (Vit-D Deficiency, Fractures)    There are no Patient Instructions on file for this visit.  Follow up plan: No follow-ups on file.  Anton Blas, MD   "

## 2024-04-21 DIAGNOSIS — R110 Nausea: Secondary | ICD-10-CM | POA: Insufficient documentation

## 2024-04-21 DIAGNOSIS — R252 Cramp and spasm: Secondary | ICD-10-CM | POA: Insufficient documentation

## 2024-04-21 LAB — VITAMIN D 25 HYDROXY (VIT D DEFICIENCY, FRACTURES): VITD: 71.03 ng/mL (ref 30.00–100.00)

## 2024-04-21 LAB — FERRITIN: Ferritin: 60.5 ng/mL (ref 10.0–291.0)

## 2024-04-21 NOTE — Assessment & Plan Note (Signed)
 Update magnesium  levels. This may be some better since starting spironolactone .

## 2024-04-21 NOTE — Assessment & Plan Note (Signed)
 She recently completed weekly vit D replacement, will start vit D 2000 units daily OTC and will update vit D levels today.

## 2024-04-21 NOTE — Assessment & Plan Note (Addendum)
 Recent hospitalization for acute on chronic diastolic CHF exacerbation with BNP high 2000s s/p net -2.4L diuresis with IV lasix  and ~10 lb weight loss - wonders if some of weight loss could be due to decreased appetite, poor PO intake.  Discharged on oral spironolactone  12.5mg  daily with PRN lasix .  Denies pedal edema, chest pain, cough, dyspnea, orthopnea.  Update labs today including Cr, K after recent spironolactone  commencement.  Reviewed working towards low salt/sodium diet, discussed daily weights, and monitoring for weight gain or progressive pedal edema/dyspnea/orthopnea as indications of worsening CHF.

## 2024-04-21 NOTE — Assessment & Plan Note (Signed)
 Attributes to when she takes meds on an empty stomach.  She is also on oral iron  which could contribute.  Rx zofran  PRN.  Update labwork today.

## 2024-04-21 NOTE — Assessment & Plan Note (Addendum)
 Chronic, BP better controlled in office with addition of spironolactone . Continue this with atenolol  and amlodipine .  Sounds like she's now taking atenolol  25mg  once daily.  Update labs today.  She notes home BPs ranging 140-150s/50s.

## 2024-04-21 NOTE — Assessment & Plan Note (Addendum)
 No noted blood in stool or urine. Known h/o iron  and b12 deficiencies after multiple colon/small bowel resections.  Recent urinalysis without blood.  Update CBC and iron  panel on oral iron  every other day - if persistent, will recommend iFOB in h/o colorectal cancer.

## 2024-04-23 ENCOUNTER — Ambulatory Visit: Payer: Self-pay | Admitting: Family Medicine

## 2024-04-29 ENCOUNTER — Ambulatory Visit

## 2024-07-21 ENCOUNTER — Ambulatory Visit

## 2024-08-16 ENCOUNTER — Other Ambulatory Visit

## 2024-08-22 ENCOUNTER — Encounter: Admitting: Family Medicine
